# Patient Record
Sex: Male | Born: 1935 | Race: White | Hispanic: No | Marital: Married | Smoking: Former smoker
Health system: Southern US, Community
[De-identification: ages and names within clinical notes are randomized; demographics above are authoritative.]

## PROBLEM LIST (undated history)

## (undated) DIAGNOSIS — Z9189 Other specified personal risk factors, not elsewhere classified: Secondary | ICD-10-CM

## (undated) DIAGNOSIS — I10 Essential (primary) hypertension: Secondary | ICD-10-CM

## (undated) DIAGNOSIS — Z8673 Personal history of transient ischemic attack (TIA), and cerebral infarction without residual deficits: Secondary | ICD-10-CM

## (undated) DIAGNOSIS — K219 Gastro-esophageal reflux disease without esophagitis: Secondary | ICD-10-CM

## (undated) DIAGNOSIS — I480 Paroxysmal atrial fibrillation: Secondary | ICD-10-CM

## (undated) DIAGNOSIS — I2510 Atherosclerotic heart disease of native coronary artery without angina pectoris: Secondary | ICD-10-CM

## (undated) DIAGNOSIS — N529 Male erectile dysfunction, unspecified: Secondary | ICD-10-CM

## (undated) DIAGNOSIS — R5383 Other fatigue: Secondary | ICD-10-CM

## (undated) DIAGNOSIS — I6523 Occlusion and stenosis of bilateral carotid arteries: Secondary | ICD-10-CM

## (undated) DIAGNOSIS — Z951 Presence of aortocoronary bypass graft: Secondary | ICD-10-CM

## (undated) DIAGNOSIS — R5381 Other malaise: Secondary | ICD-10-CM

## (undated) DIAGNOSIS — H44009 Unspecified purulent endophthalmitis, unspecified eye: Secondary | ICD-10-CM

## (undated) DIAGNOSIS — N135 Crossing vessel and stricture of ureter without hydronephrosis: Secondary | ICD-10-CM

## (undated) DIAGNOSIS — J189 Pneumonia, unspecified organism: Secondary | ICD-10-CM

## (undated) HISTORY — DX: Essential (primary) hypertension: I10

## (undated) HISTORY — PX: CORONARY ARTERY BYPASS GRAFT: SHX141

## (undated) HISTORY — DX: Atherosclerotic heart disease of native coronary artery without angina pectoris: I25.10

## (undated) HISTORY — PX: INGUINAL HERNIA REPAIR: SUR1180

## (undated) HISTORY — DX: Paroxysmal atrial fibrillation: I48.0

## (undated) HISTORY — PX: OTHER SURGICAL HISTORY: SHX169

## (undated) HISTORY — DX: Pneumonia, unspecified organism: J18.9

## (undated) HISTORY — DX: Other fatigue: R53.83

## (undated) HISTORY — DX: Other malaise: R53.81

## (undated) HISTORY — DX: Other malaise: R53.83

## (undated) HISTORY — PX: CARDIAC CATHETERIZATION: SHX172

## (undated) HISTORY — DX: Unspecified purulent endophthalmitis, unspecified eye: H44.009

## (undated) HISTORY — PX: CARDIOVASCULAR STRESS TEST: SHX262

---
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
## 2000-07-09 ENCOUNTER — Encounter: Payer: Self-pay | Admitting: Emergency Medicine

## 2000-07-09 ENCOUNTER — Encounter: Admission: RE | Admit: 2000-07-09 | Discharge: 2000-07-09 | Payer: Self-pay | Admitting: Emergency Medicine

## 2000-07-13 ENCOUNTER — Encounter: Admission: RE | Admit: 2000-07-13 | Discharge: 2000-07-13 | Payer: Self-pay | Admitting: Emergency Medicine

## 2000-07-13 ENCOUNTER — Encounter: Payer: Self-pay | Admitting: Emergency Medicine

## 2000-07-20 ENCOUNTER — Encounter: Admission: RE | Admit: 2000-07-20 | Discharge: 2000-07-20 | Payer: Self-pay | Admitting: Emergency Medicine

## 2000-07-20 ENCOUNTER — Encounter: Payer: Self-pay | Admitting: Emergency Medicine

## 2000-08-23 ENCOUNTER — Encounter: Payer: Self-pay | Admitting: Gastroenterology

## 2000-08-23 ENCOUNTER — Ambulatory Visit (HOSPITAL_COMMUNITY): Admission: RE | Admit: 2000-08-23 | Discharge: 2000-08-23 | Payer: Self-pay | Admitting: Gastroenterology

## 2000-08-24 ENCOUNTER — Encounter: Payer: Self-pay | Admitting: Gastroenterology

## 2000-08-24 ENCOUNTER — Ambulatory Visit (HOSPITAL_COMMUNITY): Admission: RE | Admit: 2000-08-24 | Discharge: 2000-08-24 | Payer: Self-pay | Admitting: Gastroenterology

## 2000-09-18 ENCOUNTER — Ambulatory Visit (HOSPITAL_COMMUNITY): Admission: RE | Admit: 2000-09-18 | Discharge: 2000-09-18 | Payer: Self-pay | Admitting: Gastroenterology

## 2000-09-26 ENCOUNTER — Encounter: Payer: Self-pay | Admitting: General Surgery

## 2000-10-02 ENCOUNTER — Encounter (INDEPENDENT_AMBULATORY_CARE_PROVIDER_SITE_OTHER): Payer: Self-pay | Admitting: Specialist

## 2000-10-02 ENCOUNTER — Ambulatory Visit (HOSPITAL_COMMUNITY): Admission: RE | Admit: 2000-10-02 | Discharge: 2000-10-03 | Payer: Self-pay | Admitting: General Surgery

## 2000-10-02 HISTORY — PX: LAPAROSCOPIC CHOLECYSTECTOMY: SUR755

## 2001-11-13 ENCOUNTER — Ambulatory Visit (HOSPITAL_COMMUNITY): Admission: RE | Admit: 2001-11-13 | Discharge: 2001-11-13 | Payer: Self-pay | Admitting: Urology

## 2001-11-13 ENCOUNTER — Encounter: Payer: Self-pay | Admitting: Urology

## 2005-10-30 ENCOUNTER — Ambulatory Visit: Payer: Self-pay | Admitting: Cardiology

## 2005-10-30 ENCOUNTER — Inpatient Hospital Stay (HOSPITAL_COMMUNITY): Admission: EM | Admit: 2005-10-30 | Discharge: 2005-11-06 | Payer: Self-pay | Admitting: Cardiology

## 2005-11-28 ENCOUNTER — Ambulatory Visit: Payer: Self-pay | Admitting: Cardiology

## 2005-11-30 ENCOUNTER — Encounter (HOSPITAL_COMMUNITY): Admission: RE | Admit: 2005-11-30 | Discharge: 2006-02-28 | Payer: Self-pay | Admitting: Cardiology

## 2005-12-18 ENCOUNTER — Ambulatory Visit: Payer: Self-pay | Admitting: Cardiology

## 2006-01-11 ENCOUNTER — Ambulatory Visit: Payer: Self-pay | Admitting: Cardiology

## 2006-01-29 ENCOUNTER — Emergency Department (HOSPITAL_COMMUNITY): Admission: EM | Admit: 2006-01-29 | Discharge: 2006-01-29 | Payer: Self-pay | Admitting: Emergency Medicine

## 2006-01-31 ENCOUNTER — Ambulatory Visit: Payer: Self-pay | Admitting: Cardiology

## 2006-03-01 ENCOUNTER — Encounter (HOSPITAL_COMMUNITY): Admission: RE | Admit: 2006-03-01 | Discharge: 2006-03-27 | Payer: Self-pay | Admitting: Cardiology

## 2006-05-22 ENCOUNTER — Ambulatory Visit: Payer: Self-pay | Admitting: Cardiology

## 2006-11-12 ENCOUNTER — Ambulatory Visit: Payer: Self-pay | Admitting: Cardiology

## 2006-12-05 ENCOUNTER — Ambulatory Visit: Payer: Self-pay

## 2006-12-26 ENCOUNTER — Encounter: Admission: RE | Admit: 2006-12-26 | Discharge: 2006-12-26 | Payer: Self-pay | Admitting: Emergency Medicine

## 2006-12-31 ENCOUNTER — Encounter: Admission: RE | Admit: 2006-12-31 | Discharge: 2006-12-31 | Payer: Self-pay | Admitting: Emergency Medicine

## 2007-01-14 ENCOUNTER — Encounter: Admission: RE | Admit: 2007-01-14 | Discharge: 2007-01-14 | Payer: Self-pay | Admitting: Emergency Medicine

## 2007-02-01 ENCOUNTER — Encounter: Admission: RE | Admit: 2007-02-01 | Discharge: 2007-02-01 | Payer: Self-pay | Admitting: Emergency Medicine

## 2007-11-07 ENCOUNTER — Ambulatory Visit: Payer: Self-pay | Admitting: Cardiology

## 2007-11-20 ENCOUNTER — Encounter: Payer: Self-pay | Admitting: Cardiology

## 2007-11-20 ENCOUNTER — Ambulatory Visit: Payer: Self-pay

## 2007-11-26 ENCOUNTER — Ambulatory Visit: Payer: Self-pay | Admitting: Cardiology

## 2007-12-26 ENCOUNTER — Ambulatory Visit: Payer: Self-pay | Admitting: Cardiology

## 2008-12-10 DIAGNOSIS — R5381 Other malaise: Secondary | ICD-10-CM | POA: Insufficient documentation

## 2008-12-10 DIAGNOSIS — F528 Other sexual dysfunction not due to a substance or known physiological condition: Secondary | ICD-10-CM

## 2008-12-10 DIAGNOSIS — I10 Essential (primary) hypertension: Secondary | ICD-10-CM

## 2008-12-10 DIAGNOSIS — R5383 Other fatigue: Secondary | ICD-10-CM

## 2008-12-16 ENCOUNTER — Encounter: Payer: Self-pay | Admitting: Cardiology

## 2008-12-21 ENCOUNTER — Encounter: Payer: Self-pay | Admitting: Cardiology

## 2008-12-22 ENCOUNTER — Ambulatory Visit: Payer: Self-pay | Admitting: Cardiology

## 2008-12-22 DIAGNOSIS — R079 Chest pain, unspecified: Secondary | ICD-10-CM

## 2008-12-24 ENCOUNTER — Ambulatory Visit (HOSPITAL_COMMUNITY): Admission: RE | Admit: 2008-12-24 | Discharge: 2008-12-24 | Payer: Self-pay | Admitting: Emergency Medicine

## 2009-01-11 ENCOUNTER — Telehealth (INDEPENDENT_AMBULATORY_CARE_PROVIDER_SITE_OTHER): Payer: Self-pay

## 2009-01-12 ENCOUNTER — Ambulatory Visit: Payer: Self-pay

## 2009-01-12 ENCOUNTER — Encounter: Payer: Self-pay | Admitting: Cardiology

## 2009-12-15 DIAGNOSIS — I2510 Atherosclerotic heart disease of native coronary artery without angina pectoris: Secondary | ICD-10-CM | POA: Insufficient documentation

## 2009-12-23 ENCOUNTER — Ambulatory Visit: Payer: Self-pay | Admitting: Cardiology

## 2009-12-23 DIAGNOSIS — E785 Hyperlipidemia, unspecified: Secondary | ICD-10-CM

## 2010-09-27 NOTE — Assessment & Plan Note (Signed)
Summary: 77 YR F/U   Visit Type:  1 yr f/u Primary Provider:  Leslee Home  CC:  no cardiac complaints today.  History of Present Illness: Jose [name] returns today for evaluation and management coronary artery disease, history of cardiac bypass grafting in 2007, history of an inferior Jose [name] infarct at the time of presentation.  He is having no angina or ischemic symptoms. His blood work is being followed by Dr. Lorenz Coaster. He has a physical next week.  Stress Myoview last May showed inferior Jose [name] scar EF 54% no ischemia.  Current Medications (verified): 1)  Aspirin Ec 325 Mg Tbec (Aspirin) .... Take One Tablet By Mouth Daily 2)  Accupril 20 Mg Tabs (Quinapril Hcl) .Marland Kitchen.. 1 Tab Once Daily 3)  Prilosec 20 Mg Cpdr (Omeprazole) .Marland Kitchen.. 1 Tab Once Daily 4)  Multivitamins   Tabs (Multiple Vitamin) .Marland Kitchen.. 1 Tab Once Daily 5)  Saw Palmetto 160 Mg Caps (Saw Palmetto (Serenoa Repens)) .... 2 Tab Once Daily 6)  Amlodipine Besylate 5 Mg Tabs (Amlodipine Besylate) .Marland Kitchen.. 1 Tab Once Daily 7)  Cialis 2.5 Mg Tabs (Tadalafil) .Marland Kitchen.. 1 Tab Once Daily 8)  Advil Allergy Sinus 2-30-200 Mg Tabs (Chlorpheniramine-Pse-Ibuprofen) .... As Needed 9)  Pravastatin Sodium 40 Mg Tabs (Pravastatin Sodium) .... 2 Tabs At Bedtime  Allergies: 1)  ! * Xray Dye 2)  ! Sulfa 3)  ! Codeine  Past History:  Past Medical History: Last updated: 12/15/2009 CAD, NATIVE VESSEL (ICD-414.01) CHEST PAIN UNSPECIFIED (ICD-786.50) HYPERTENSION, UNSPECIFIED (ICD-401.9) FATIGUE (ICD-780.79) ERECTILE DYSFUNCTION (ICD-302.72)  Past Surgical History: Last updated: 12/10/2008 CABG March 2007 Appendectomy Cholecystectomy Bilateral inguinal hernia repair Multiple transurethral procedures for urteral obstruction  Family History: Last updated: 12/10/2008 Family History of Coronary Artery Disease:  Mother  Social History: Last updated: 12/10/2008 Retired  Married  Tobacco Use - Former.  quit 1979 Alcohol Use - no Drug Use - no  Risk  Factors: Smoking Status: quit (12/10/2008)  Review of Systems       negative other than history of present illness  Vital Signs:  Patient profile:   77 year old male Height:      68 inches Weight:      179 pounds BMI:     27.32 Pulse rate:   67 / minute Pulse rhythm:   irregular BP sitting:   120 / 70  (left arm) Cuff size:   large  Vitals Entered By: Danielle Rankin, CMA (December 23, 2009 9:04 AM)  Physical Exam  General:  Well developed, well nourished, in no acute distress.ruddy complexion which is baseline Head:  normocephalic and atraumatic Eyes:  PERRLA/EOM intact; conjunctiva and lids normal. Neck:  Neck supple, no JVD. No masses, thyromegaly or abnormal cervical nodes. Chest Kailey Esquilin:  no deformities or breast masses noted Lungs:  Clear bilaterally to auscultation and percussion. Heart:  Non-displaced PMI, chest non-tender; regular rate and rhythm, S1, S2 without murmurs, rubs or gallops. Carotid upstroke normal, no bruit. Normal abdominal aortic size, no bruits. Femorals normal pulses, no bruits. Pedals normal pulses. No edema, no varicosities. Abdomen:  Bowel sounds positive; abdomen soft and non-tender without masses, organomegaly, or hernias noted. No hepatosplenomegaly. Msk:  Back normal, normal gait. Muscle strength and tone normal. Pulses:  diminished but present the lower extremity Extremities:  trace left pedal edema and trace right pedal edema.   Neurologic:  Alert and oriented x 3. Skin:  Intact without lesions or rashes. Psych:  Normal affect.   EKG  Procedure date:  12/23/2009  Findings:  normal sinus rhythm with PACs, no changes, stable  Impression & Recommendations:  Problem # 1:  CAD, NATIVE VESSEL (ICD-414.01) Assessment Unchanged  His updated medication list for this problem includes:    Aspirin Ec 325 Mg Tbec (Aspirin) .Marland Kitchen... Take one tablet by mouth daily    Accupril 20 Mg Tabs (Quinapril hcl) .Marland Kitchen... 1 tab once daily    Amlodipine Besylate 5 Mg  Tabs (Amlodipine besylate) .Marland Kitchen... 1 tab once daily  Orders: EKG w/ Interpretation (93000)  Problem # 2:  CHEST PAIN UNSPECIFIED (ICD-786.50) Assessment: Improved  His updated medication list for this problem includes:    Aspirin Ec 325 Mg Tbec (Aspirin) .Marland Kitchen... Take one tablet by mouth daily    Accupril 20 Mg Tabs (Quinapril hcl) .Marland Kitchen... 1 tab once daily    Amlodipine Besylate 5 Mg Tabs (Amlodipine besylate) .Marland Kitchen... 1 tab once daily  Problem # 3:  HYPERTENSION, UNSPECIFIED (ICD-401.9) Assessment: Improved  His updated medication list for this problem includes:    Aspirin Ec 325 Mg Tbec (Aspirin) .Marland Kitchen... Take one tablet by mouth daily    Accupril 20 Mg Tabs (Quinapril hcl) .Marland Kitchen... 1 tab once daily    Amlodipine Besylate 5 Mg Tabs (Amlodipine besylate) .Marland Kitchen... 1 tab once daily  Problem # 4:  HYPERLIPIDEMIA-MIXED (ICD-272.4)  His updated medication list for this problem includes:    Pravastatin Sodium 40 Mg Tabs (Pravastatin sodium) .Marland Kitchen... 2 tabs at bedtime  Patient Instructions: 1)  Your physician recommends that you schedule a follow-up appointment in: year with dr Jose [name] 2)  Your physician recommends that you continue on your current medications as directed. Please refer to the Current Medication list given to you today.

## 2010-12-16 ENCOUNTER — Ambulatory Visit: Payer: Self-pay | Admitting: Cardiology

## 2010-12-28 ENCOUNTER — Encounter: Payer: Self-pay | Admitting: Cardiology

## 2010-12-29 ENCOUNTER — Ambulatory Visit (INDEPENDENT_AMBULATORY_CARE_PROVIDER_SITE_OTHER): Payer: Medicare Other | Admitting: Cardiology

## 2010-12-29 ENCOUNTER — Encounter: Payer: Self-pay | Admitting: Cardiology

## 2010-12-29 VITALS — BP 116/58 | HR 57 | Ht 70.0 in | Wt 179.8 lb

## 2010-12-29 DIAGNOSIS — I2510 Atherosclerotic heart disease of native coronary artery without angina pectoris: Secondary | ICD-10-CM

## 2010-12-29 NOTE — Assessment & Plan Note (Signed)
Stable, continue current meds 

## 2010-12-29 NOTE — Patient Instructions (Signed)
Your physician recommends that you schedule a follow-up appointment in: 1 year with Dr. Wall  

## 2010-12-29 NOTE — Progress Notes (Signed)
   Patient ID: Jose [name], male    DOB: [birth year], 77 y.o.   MRN: [id number]  HPI Jose [name] returns for E and M of his CAD and history of CABG. He has moved to a nice retirement community and is very active [employer]. No angina of ischemic symptoms. EKG shows sinus bradycardia, otherwise normal.    Review of Systems  All other systems reviewed and are negative.      Physical Exam  Nursing note and vitals reviewed. Constitutional: He is oriented to person, place, and time. He appears well-developed and well-nourished. No distress.  HENT:  Head: Normocephalic and atraumatic.  Eyes: EOM are normal. Pupils are equal, round, and reactive to light.  Neck: Neck supple. No JVD present. No tracheal deviation present. No thyromegaly present.       No bruits  Cardiovascular: Normal rate, regular rhythm, normal heart sounds and intact distal pulses.   No murmur heard. Pulmonary/Chest: Effort normal and breath sounds normal.  Abdominal: Soft. Bowel sounds are normal.       No bruit  Musculoskeletal: Normal range of motion. He exhibits no edema.  Neurological: He is alert and oriented to person, place, and time.  Skin: Skin is warm and dry.  Psychiatric: He has a normal mood and affect.

## 2011-01-06 ENCOUNTER — Encounter: Payer: Self-pay | Admitting: Cardiology

## 2011-01-10 NOTE — Assessment & Plan Note (Signed)
[facility]                            CARDIOLOGY OFFICE NOTE   NAME:Ricchio, [name]                     MRN:          [id number]  DATE:11/07/2007                            DOB:          [birth year]    Jose [name] returns today for further management of his coronary  disease.   He is status post coronary bypass grafting for severe three-vessel  coronary disease and class IV angina in March 2007.   He is having no angina or ischemic symptoms.  He has no orthopnea, PND,  or peripheral edema.  His biggest complaint is erectile dysfunction,  which is responding marginally to Cialis.  He is wondering if Toprol  could do this.   He also has hypertension, hyperlipidemia being followed by Dr. Leslee Home.  He says his numbers have been very good.   Stress Myoview December 05, 2006 showed no ischemia.  An EF of 53%.  Had  some inferior lateral wall scar with decreased motion in that area.   His meds today are Toprol XL 75 mg a day, saw palmetto, multivitamin,  Prilosec over-the-counter, Pravachol 80 mg a day, Accupril 20 mg a day,  aspirin 325 mg a day.   Blood pressure today is 135/82 in the left arm.  Pulse is 58 and  regular.  EKG shows sinus brady otherwise normal.  His weight is 193 up  6.  HEENT:  Skin color is less ruddy than usual.  PERRL.  Extraocular is  intact.  Sclerae are clear.  Face symmetry normal.  Carotids were equal bilateral bruits, no JVD.  Thyroid is not enlarged.  Neck is supple.  Trachea is midline.  LUNGS:  Clear to auscultation.  HEART:  Reveals a stable sternotomy, normal S1-S2.  No gallop, rub or  murmur.  ABDOMEN:  Soft, good bowel sounds.  No midline bruit.  No hepatomegaly.  EXTREMITIES:  No cyanosis, clubbing or edema.  Pulses are intact.  NEURO:  Exam is intact.   I had A nice chat with Jose [name] today.  I have decreased his Toprol  to 37.5 mg a day to see if this helps his erectile dysfunction.  We will  repeat  his 2-D echocardiogram.  If his EF is greater than 50%, the  relative risk/benefit of continuing beta blockade is very low.  At that  point, we may just stop it altogether.  I will have him come back in  several weeks to see how he feels.  Will do  the echo and decide on further dosing of his Toprol.  I told him he may  need an additional drug absent Toprol to control his blood pressure with  his Accupril.     Thomas C. Daleen Squibb, MD, [facility]  Electronically Signed    TCW/MedQ  DD: 11/07/2007  DT: 11/08/2007  Job #: [id number]   cc:   Reuben Likes, M.D.

## 2011-01-10 NOTE — Assessment & Plan Note (Signed)
[facility]                            CARDIOLOGY OFFICE NOTE   NAME:Hepner, [name]                     MRN:          [id number]  DATE:12/26/2007                            DOB:          07/14/1931    [name] returns today for further management of his hypertension,  coronary disease, fatigue and erectile dysfunction.   Discontinued his Toprol and placed him on amlodipine 5 mg a day.   His erectile dysfunction is not improved unless he uses something like  Cialis, however, his blood pressure is under great control and he had no  side effects on the amlodipine.  His EF is essentially normal, so we  felt safe in stopping his beta-blocker.   His blood pressure today is 117/69.  His pulse is 74 and regular.  His  weight is 192.  The rest of the exam is unchanged.  LUNGS:  Clear.  HEART:  Reveals a regular rate and rhythm.  The abdomen is soft.  The extremities reveal no edema.  Pulses are intact.   [name] blood pressure is actually better on amlodipine than it  was on Toprol.  Unfortunately, his erectile dysfunction has not  substantially improved and I advised him to follow up with Dr. Lorenz Coaster  and also maybe urology for this.  I will plan on seeing him back in a  year.  We will stay off the beta-blocker.     Thomas C. Daleen Squibb, MD, [facility]  Electronically Signed    TCW/MedQ  DD: 12/26/2007  DT: 12/26/2007  Job #: [id number]

## 2011-01-10 NOTE — Assessment & Plan Note (Signed)
[facility]                            CARDIOLOGY OFFICE NOTE   NAME:Jose [name], Jose [name]                     MRN:          [id number]  DATE:11/26/2007                            DOB:          1930-11-02    Mr. Runyan returns today for further management of his fatigue and  erectile dysfunction.  I decreased his Toprol XL from 75 to 37.5 mg on  last visit.  Please refer to that note.   Unfortunately, he has not seen any significant change in his symptoms.   Blood pressure today was 142/82, but I checked it again in the left arm;  it was 160/82.  His pulse is 62 and regular.  Weight is 189, down 4.  The rest his exam is unchanged.  He has no peripheral edema.  He does  have dependent rubor with decreased blood flow to his toes.  His  dorsalis pedis and posterior tibial pulses are present, however.   ASSESSMENT/PLAN:  Mr. Dittus has noted no significant improvement in  his erectile dysfunction or his fatigue on a lower dose of Toprol.  We  are going to discontinue it altogether.  We will add amlodipine 5 mg a  day to his Accupril 20 mg a day.  I will see him back in 4 weeks for a  blood pressure check and see how he is feeling.  Hopefully this will  help some of his symptoms.  Maintaining his blood pressures is of  absolute importance, however, as I explained to him and his wife,  Jose [name], today.     Thomas C. Daleen Squibb, MD, [facility]  Electronically Signed    TCW/MedQ  DD: 11/26/2007  DT: 11/26/2007  Job #: [id number]   cc:   Reuben Likes, M.D.

## 2011-01-13 NOTE — Op Note (Signed)
[place]  Patient:    Jose [name], Jose [name]                     MRN: [id number] Proc. Date: 10/02/00 Adm. Date:  [id number] Disc. Date: [id number] Attending:  Charna Elizabeth CC:         Anselmo Rod, M.D.  Reuben Likes, M.D.   Operative Report  PREOPERATIVE DIAGNOSIS:  Biliary dyskinesia.  POSTOPERATIVE DIAGNOSIS:  Biliary dyskinesia.  PROCEDURE:  Laparoscopic cholecystectomy.  SURGEON:  Adolph Pollack, M.D.  ASSISTANT:  Gita Kudo, M.D.  ANESTHESIA:  General.  INDICATIONS:  Mr. Gustafson is a 77 year old male, who has been having postprandial right upper quadrant pain that radiates around to his back associated with some nausea.  Fairly classic of biliary colic.  He has had an ultrasound of the gallbladder that was normal.  He had a nuclear medicine hepatobiliary scan and drank some half-and-half.  His ejection fraction was reported as being normal, but when he had the half-and-half, he had the recurrence of the pain.  Unfortunately, cholecystokinin is not available.  He was tried on antispasmodics without any improvement of symptoms.  It was felt that he likely has biliary dyskinesia despite his nuclear medicine hepatobiliary scan and is now brought to the operating room.  TECHNIQUE:  He was placed supine on the operating table and general anesthetic was administered.  The abdomen was sterilely prepped and draped.  Local anesthetic was infiltrated in the subumbilical region and a subumbilical incision was made and carried down through the fascia, where a 1.5 cm incision was made in the midline fascia.  A purse-string suture of 0 Vicryl was placed around the fascial edges.  The peritoneum was grasped, incised sharply and entered under direct vision.  A Hasson trocar was introduced into the peritoneal cavity and pneumoperitoneum created by insufflation of CO2 gas. Next, the laparoscope was introduced.  I examined the abdomen.  I  could see the scarring from bilateral inguinal hernia repairs.  The gallbladder appeared pale in color.  It was not the normal bluish color.  An incision was made in the epigastrium and an 11 mm trocar passed into the abdominal cavity.  Two 5 mm trocars were placed in the right and mid abdomen.  The fundus of the gallbladder was grasped and retracted towards the right shoulder.  The infundibulum was grasped and retracted laterally.  Using blunt dissection, the infundibulum was mobilized.  I then identified the cystic duct at its junction with gallbladder and isolated it.  I also isolated the cystic artery.  I clipped the cystic duct three times proximally, once distally and divided it sharply.  The cystic artery was then clipped and divided.  The gallbladder was dissected free from the liver bed using electrocautery.  The liver bed was irrigated and bleeding points were controlled with cautery.  There was a small hole in the fundus with some bile leakage.  I placed the gallbladder in an Endopouch bag.  I then copiously irrigated out the perihepatic area and examined the gallbladder fossa and again noting no bleeding or bile leakage. I continued irrigation and then evacuated the fluid until it was clear.  Next, I removed the gallbladder in the Endopouch bag through the subumbilical port and then closed the fascial defect by tightening up and tying down the purse-string suture under laparoscopic vision.  The rest of the trocars were removed and no bleeding was noted.  The pneumoperitoneum was  then released.  The skin incisions were then closed with 4-0 Monocryl subcuticular stitches, followed by steri-strips and sterile dressings.  He tolerated the procedure well without any apparent complications and was taken to the recovery room in satisfactory condition. DD:  10/02/00 TD:  10/02/00 Job: [id number]

## 2011-01-13 NOTE — Procedures (Signed)
[place]  Patient:    Jose [name], Jose [name]                     MRN: [id number] Proc. Date: 09/18/00 Adm. Date:  [id number] Attending:  Charna Elizabeth CC:         Reuben Likes, M.D.             Adolph Pollack, M.D.                           Procedure Report  DATE OF BIRTH:  April 21, 1931  REFERRING PHYSICIAN:  PROCEDURE PERFORMED:  Esophagogastroduodenoscopy.  ENDOSCOPIST:  Anselmo Rod, M.D.  INSTRUMENT USED:  Olympus video panendoscope.  INDICATIONS FOR PROCEDURE:  Severe right upper quadrant pain with nausea and vomiting radiating to the epigastrium and the back in a 77 year old white male with essentially normal abdominal ultrasound, HIDA scan and abdominal CT, rule out peptic ulcer disease, esophagitis, gastritis, etc.  PREPROCEDURE PREPARATION:  Informed consent was procured from the patient. The patient was fasted for eight hours prior to the procedure.  PREPROCEDURE PHYSICAL:  The patient had stable vital signs.  Neck supple. Chest clear to auscultation.  S1, S2 regular.  Abdomen soft with normal abdominal bowel sounds.  DESCRIPTION OF PROCEDURE:  The patient was placed in left lateral decubitus position and sedated with 50 mg of Demerol and 5 mg of Versed intravenously. Once the patient was adequately sedated and maintained on low-flow oxygen and continuous cardiac monitoring, the Olympus video panendoscope was advanced through the mouthpiece, over the tongue, into the esophagus under direct vision.  The entire esophagus appeared normal without evidence of ring stricture, masses, lesions, esophagitis or Barretts mucosa.  The scope was then advanced to the stomach.  The entire gastric mucosa appeared healthy with no evidence of erosions, ulcerations, masses or polyps.  The duodenal bulb and the small bowel distal to the bulb appeared normal.  IMPRESSION:  Normal esophagogastroduodenoscopy.  RECOMMENDATION:  Surgical  evaluation has been recommended for the patient because of his right upper quadrant pain associated with nausea and vomiting. The patient will be scheduled to see Adolph Pollack, M.D. for further evaluation for possible laparoscopic cholecystectomy. DD:  09/18/00 TD:  09/18/00 Job: [id number]

## 2011-01-13 NOTE — H&P (Signed)
NAMEHILMAR, Jose [name] NO.:  [id number]   MEDICAL RECORD NO.:  [id number]          PATIENT TYPE:  INP   LOCATION:  2028                         FACILITY:  [facility]   PHYSICIAN:  Jesse Sans. Wall, M.D.   DATE OF BIRTH:  Jan 17, 1931   DATE OF ADMISSION:  10/30/2005  DATE OF DISCHARGE:                                HISTORY & PHYSICAL   BRIEF HISTORY:  Jose [name] is a 77 year old white male who is transferred  from [facility] to [facility] unit 2000 for further cardiac  evaluation.  Jose [name] has been staying at [facility] since early  February and states that he has been in his usual state of good health until  this past Thursday, October 26, 2005.  He stated initially on Thursday he  noticed an abdominal and chest bloating sensation.  This did not change with  burping, Rolaids, Alka-Seltzer.  He felt that his bowel movements were  slightly unusual and that he was slightly constipated.  He took some  laxatives on Thursday with partial relief.  On Friday he felt a little  better from the bloating sensation.  However, he noted an anterior chest  pressure that radiated up into his posterior neck.  He thought he might have  had some shortness of breath, particularly when he tried to walk around on  Friday.  He stated with exertion he felt much worse.  He felt better with  rest but he could not get comfortable whether lying down, sitting up.  His  general feeling of unwell at least lasted all day Friday into Saturday and  even Saturday evening.  Saturday evening he did note some nausea, but denied  vomiting or diaphoresis.  He stated that his appetite was fine throughout  this.  Early Sunday morning he stated that he just felt worse, very  uncomfortable.  This prompted his presentation to [facility].   He was admitted to [facility] and overnight he was ruled out for  myocardial infarction.  He continued to not feel well.  Discharge was  anticipated.  However, the physician at [facility] spoke with his primary  care physician and it was felt that he should be transferred here for  further evaluation from a cardiac perspective.  [facility] transferred Mr.  [name] and en route the patient complained of some chest discomfort and IV  nitroglycerin was started.  He thinks that the IV nitroglycerin relieved his  discomfort; however, now he has a headache.  He denies any recent injuries  or accidents.   PAST MEDICAL HISTORY:   ALLERGIES:  SULFA, CODEINE, X-RAY DYE that causes his throat to swell.   MEDICATIONS PRIOR TO ADMISSION:  1.  Quinapril 40 mg daily.  2.  Aspirin 81 mg daily.  3.  Fish oil unknown dosage daily.  4.  Prilosec 20 mg daily.  5.  Centrum Silver daily.  6.  Saw palmetto until unknown dosage daily.   PAST MEDICAL HISTORY:  1.  He has a history of hyper lipidemia, unknown last check.  2.  Rosacea.  3.  Hypertension which ranges from 120s-140s/80s at home.  4.  GERD.  5.  Status post cholecystectomy, appendectomy, bilateral inguinal hernia      repair, kidney surgery in the 1950s.  6.  He denies any problems with diabetes, myocardial infarction, CVA, COPD,      bleeding dyscrasias, or thyroid dysfunction.   SOCIAL HISTORY:  He resides in [place] with his wife.  He is a retired  Airline pilot and enjoys Naval architect.  He quit smoking 30 years ago.  Prior to  that he smoked a pack per day for 25 years.  He may have a glass of wine one  to two times per year.  He denies any drugs, specific diet, or exercise  program.  He does use of herbal supplements as listed.   FAMILY HISTORY:  His mother died in her 45s secondary to heart problems with  a history of hypertension and pacer.  His father died at the age of 67  secondary to lymphoma with a history of prostate cancer.  He has two  brothers that are living.  One had an aneurysm repair, specifics unknown.  Another brother has had a stent placed in his  leg.  Two brothers are  deceased, one of prostate cancer, another with a history of coronary artery  disease and had bypass surgery in the past.  He has two sisters.  One has  problems with depression and the other with stomach problems.   REVIEW OF SYSTEMS:  Notable for occasional headaches, reading glasses, upper  and lower dentures, rosacea, prior skin cancer removals, history as noted  above, straining, nocturia, right shoulder and neck arthralgias.  It is  noted that he states that the above discomfort does not resemble his usual  indigestion.   PHYSICAL EXAMINATION:  GENERAL:  Well-nourished, well-developed, pleasant  white male in no apparent distress.  HEENT:  Essentially unremarkable except for dentures and his mouth is  somewhat flattened on the right side which is normal according to the  patient and his wife.  NECK:  Supple without thyromegaly, adenopathy, JVD, or carotid bruits.  He  does have point tenderness in his posterior neck.  HEART:  Regular rate and rhythm.  Normal S1 and S2.  Did not appreciate any  murmurs, rubs, clicks, or gallops.  All pulses are symmetrical and intact  without bruits.  CHEST:  Symmetrical excursion.  Lungs sounds are diminished without rales,  rhonchi, or wheezing.  SKIN/INTEGUMENT:  Intact without lesions.  ABDOMEN:  Obese.  Bowel sounds present without organomegaly, masses, or  tenderness.  EXTREMITIES:  Negative cyanosis, clubbing, or edema.  MUSCULOSKELETAL:  Essentially unremarkable except for tenderness posterior  neck and lower back and some right upper extremity slight limitations in  full range of motion activities.  NEUROLOGIC:  Grossly intact.   LABORATORIES:  Chest x-ray from [facility] is pending at the time of this  dictation.  Abdominal and pelvic CT on October 29, 2005 showed severe right  hydronephrosis with UP junction stenosis.  EKGs x3 at [facility] showed sinus brady, normal sinus rhythm, no acute changes, some  baseline artifact.  CBC is not available.  Sodium was 136, potassium 3.7, BUN 16, creatinine  1.3, and glucose 129.  CK-MBs and troponins were negative x4.  Lipids showed  a total cholesterol 254, triglycerides 155, HDL 15, and LDL of 89.   IMPRESSION:  1.  Acute coronary syndrome transfer from [facility]; however,  is ruled out for myocardial infarction by EKG and enzymes.  2.  Hypertension on admission to [facility].  3.  Hyperlipidemia.  4.  Dye allergy.  5.  History as noted above.   DISPOSITION/PLAN:  Dr. Daleen Squibb reviewed the patient's history, spoke with and  examined the patient and agrees with the above.  We will arrange for a  cardiac catheterization to further evaluate his symptoms.  He will be pre  medicated for dye allergy as well as fluids for protection of his kidneys.  In addition, I have called [facility] for copies of his chest x-  ray report and CBC to be faxed to the unit 2000.  We will also repeat  laboratories, especially in regards to his CBC, BMET, addition to amylase,  lipase, LFTs and one more CK-MB and troponin.  We will also check a  urinalysis.  I have given him educational material in regards to stress  testing, catheterization, and hyperlipidemia.      Joellyn Rued, P.A. [facility]      Thomas C. Wall, M.D.  Electronically Signed    EW/MEDQ  D:  10/30/2005  T:  10/31/2005  Job:  [id number]   cc:   Courtney Paris, M.D.  Fax: [phone]   Reuben Likes, M.D.  Fax: [phone]

## 2011-01-13 NOTE — Discharge Summary (Signed)
Jose [name], Jose [name] NO.:  [id number]   MEDICAL RECORD NO.:  [id number]          PATIENT TYPE:  INP   LOCATION:  2008                         FACILITY:  [facility]   PHYSICIAN:  Salvatore Decent. Cornelius Moras, M.D. DATE OF BIRTH:  06/24/1931   DATE OF ADMISSION:  10/30/2005  DATE OF DISCHARGE:                                 DISCHARGE SUMMARY   ADMIT DIAGNOSIS:  Chest pain.   PAST MEDICAL HISTORY AND DISCHARGE DIAGNOSES:  1.  Hypertension.  2.  Gastroesophageal reflux disease.  3.  Chronic right-sided hydronephrosis.  4.  Hyperlipidemia.  5.  Three-vessel coronary artery disease with patent foramen ovale, status      post coronary artery bypass grafting x6 with closure of patent foramen      ovale.  6.  Postoperative atrial fibrillation, currently sinus, on amiodarone.  7.  Status post cholecystectomy.  8.  Status post appendectomy.  9.  Status post bilateral inguinal hernia repair.  10. Status post multiple transurethral procedures for ureteral obstruction.   ALLERGIES:  1.  IV CONTRAST, which causes throat swelling and rash.  2.  SULFA DRUGS.  3.  CODEINE.  4.  NITROGLYCERIN causes nausea and vomiting.   BRIEF HISTORY:  The patient is a 77 year old Caucasian male with no previous  history of coronary artery disease, who states that he was in his usual  state of health until October 26, 2005, at which time he began to develop  substernal and epigastric pressure-like discomfort that he initially  attributed to indigestion and constipation.  The patient's symptoms  persisted for 24-48 hours, despite the use of laxatives with bowel movement  and antacids.  The pain became more localized to the chest at time and then  radiated up into his neck.  On October 28, 2005, the pain persisted so that the  patient could not sleep and he ultimately presented to the emergency room at  [facility] in [place], [place].  The patient was  noted to have a resting EKG in  sinus rhythm with some subtle ST segment  changes in the inferior leads.  He was also notably hypertensive.  The  symptoms persisted, despite administration of a GI cocktail.  He also  underwent abdominal and pelvic CT scan that showed only what appeared to be  chronic right-sided hydronephrosis with no other significant findings.  The  patient was admitted to [facility] with presumed acute coronary  syndrome.  Cardiac enzymes remained negative and the patient was then  transferred to [facility] for further management and therapy.  At times, his  symptoms have been relieved with nitroglycerin.   HOSPITAL COURSE:  The patient was admitted to [facility] via transfer from  [facility] in [place] for further management and workup of  presumed acute coronary syndrome.  The patient was taken for elective  cardiac cath by Dr. Riley Kill on October 31, 2005 and this revealed severe three-  vessel coronary artery disease with normal left ventricular function.  The  CVTS Service was then consulted regarding surgical revascularization.  Dr.  Cornelius Moras evaluated the  patient on October 31, 2005 and it was his opinion that the  patient should proceed with coronary artery bypass grafting.  Of note, the  patient did complain of nausea and vomiting, status post cardiac  catheterization.  A GI consult was obtained by Dr. Elnoria Howard, who noted that the  patient had nausea and vomiting in association with nitroglycerin  administration.  When nitroglycerin was discontinued, the patient no longer  had any difficulty with nausea and vomiting.   The patient was taken to the OR on November 01, 2005 for coronary artery bypass  grafting x6 with endoscopic vessel harvesting of the right lower extremity.  On preoperative TEE, a patent foramen ovale was noted and this was close  simultaneously at the time of CABG.  The left internal mammary artery was  grafted to the LAD, saphenous vein was grafted in sequence to  the PD and PL,  saphenous vein was grafted to the diagonal, and saphenous vein was grafted  in sequence to OM-1 and OM-2.  The patient tolerated the procedure well and  was hemodynamically stable immediately postoperatively.  The patient was  transferred from the OR to the SICU in stable condition.  The patient was  extubated without complication and woke up from anesthesia neurologically  intact.   The patient's postoperative course has progressed as expected.  On  postoperative day 1, the patient was afebrile with stable vital signs and  maintaining a normal sinus rhythm.  He had minimal pain.  All invasive lines  and chest tubes were discontinued in a routine manner.  The patient has been  volume-overloaded postoperatively and has been diuresed accordingly.  The  patient began cardiac rehab on postoperative day 1 and has increased his  tolerance to a satisfactory level at this time.   On postoperative day 2, the patient was noted to go into atrial fibrillation  and was started on IV amiodarone.  The patient subsequently converted to a  normal sinus rhythm in the 60s and then was changed to p.o. amiodarone.  He  has tolerated this well and has continued to maintain a normal sinus rhythm  since that time.  He has also been maintained on a beta blocker.  The  patient was transferred from the unit to the floor without difficulty.  On  postoperative day 4, the patient is without complaint, except loose stools  on postoperative day 3.  He has had no further loose stools since that time  and his stool softeners have been held.  He is afebrile with stable vital  signs and maintaining a normal sinus rhythm.  He is still volume-overloaded  and will be sent home on diuretic therapy.  On physical exam, cardiac is  regular rate and rhythm, the lungs are clear to auscultation, the abdomen is  benign, the incisions are clean, dry and intact and there is no edema present in the extremities.  The  patient is in stable condition at this time  and as long he continues to progress in the current manner, will be ready  for discharge within the next 2-3 days, pending morning round reevaluation.   LABORATORY DATA:  CBC on October 27, 2005:  White count 11.5, hemoglobin 10.6,  hematocrit 30.3, platelets 173,000.  BMP on November 04, 2005:  Sodium 135,  potassium 3.8, BUN 21, creatinine 1.5, glucose 99.   CONDITION ON DISCHARGE:  Improved.   INSTRUCTIONS:   MEDICATIONS:  1.  Aspirin 325 mg daily.  2.  Toprol-XL 25  mg daily.  3.  Quinapril 20 mg daily.  4.  Pravachol 80 mg daily.  5.  Amiodarone 200 mg twice daily.  6.  Lasix 40 mg daily x5 days.  7.  K-Dur 20 mEq daily x5 days.  8.  Tylox one to two q.4-6 h. p.r.n. pain.   ACTIVITY:  No driving, no lifting more than 10 pounds x3 weeks and the  patient should continue daily breathing and walking exercises.   DIET:  Low salt, low fat.   WOUND CARE:  The patient may shower daily and clean the incisions with soap  and water.  If wound problems arise, the patient should contact the CVTS  office at [phone].   FOLLOWUP APPOINTMENTS:  1.  Dr. Riley Kill; the patient should contact his office for an appointment 2      weeks after discharge, at which time a chest x-ray will be taken.  2.  Dr. Cornelius Moras 3 weeks after discharge; the CVTS office will contact the      patient with a date and time of that appointment.      Pecola Leisure, PA      Salvatore Decent. Cornelius Moras, M.D.  Electronically Signed    AY/MEDQ  D:  11/05/2005  T:  11/07/2005  Job:  [id number]   cc:   Arturo Morton. Riley Kill, M.D. [facility]  [address]  [address]  [place]  [place] [address]

## 2011-01-13 NOTE — Cardiovascular Report (Signed)
NAMELAVAN, IMES NO.:  [id number]   MEDICAL RECORD NO.:  [id number]          PATIENT TYPE:  INP   LOCATION:  2918                         FACILITY:  [facility]   PHYSICIAN:  Arturo Morton. Riley Kill, M.D. [facility] OF BIRTH:  1931/02/02   DATE OF PROCEDURE:  10/31/2005  DATE OF DISCHARGE:                              CARDIAC CATHETERIZATION   INDICATIONS:  Mr. Dutton as a 77 year old gentleman who [place] angina pectoris. The current study  was done to assess coronary anatomy. His symptoms were consistent with an  acute coronary syndrome.   PROCEDURES:  1.  Left heart catheterization  2.  Selective coronary arteriography.  3.  Selective left ventriculography.  4.  Subclavian angiography.   DESCRIPTION OF PROCEDURE:  The patient was brought to the catheterization  laboratory, prepped and draped in the usual fashion. Through an anterior  puncture, the right femoral artery was entered using a Smart needle. A 6-  French sheath was then placed. Views of the left and right coronary arteries  were obtained in multiple angiographic projections. Subclavian angiography  was performed. Central aortic and left ventricular pressures measured with a  pigtail. Ventriculography was performed in the RAO projection. The patient  tolerated the procedure well. There were no complications. Importantly, the  patient has a history of hydronephrosis. He did have a creatinine of 1.4 and  1.5. He also has a history of contrast allergy and therefore he was  pretreated prior to the procedure. There were no complications. I reviewed  the films with his family in detail.   Surgical consultation was then called.   HEMODYNAMIC DATA:  1.  Central aortic pressure 137/73.  2.  Left ventricular pressure 148/22.  3.  No gradient on pullback across the aortic valve.   ANGIOGRAPHIC DATA:  1.  On plain fluoroscopy there is evidence of fairly extensive  calcification      involving the left and right coronary systems.  2.  Ventriculography was done in the RAO projection. Overall systolic      function appeared to be well-preserved. The aortic leaflets appeared to      move well. There did not appear to be significant mitral regurgitation      nor was there significant wall motion abnormality.  3.  The subclavian appeared to be widely patent as did the internal mammary      vessel.  4.  The right coronary artery is a heavily calcified vessel. There is 50-70%      narrowing proximally and then a 60-70% area of segmental plaquing in the      midvessel that appears to be heavily moderately calcified. The PDA is a      large-caliber vessel of about 70% ostial narrowing and probably 30%      midnarrowing. In the continuation branch that extends on beyond the PDA,      there is about 30% narrowing and then there is disease involving the      posterolateral system at the bifurcation of the first and second      branches.  5.  The left coronary artery is calcified. There is distal tapering of the      left main with about 30% narrowing.  6.  The LAD courses to the apex. The LAD provides a fairly large proximal      diagonal that has probably about 30% narrowing in its takeoff. There is      then segmental 40% disease at the takeoff of the subbranch and there is      an inferior subbranch that has about 90% narrowing but the distal vessel      was relatively small in caliber. The native LAD after the septal      perforator has a 75% area of focal stenosis that is calcified and then      another 75-80% area of focal disease in the midportion of the LAD near      the middistal junction. The distal LAD then wraps the apex and does      appear to be a graftable artery.  7.  The circumflex provides a first marginal that has segmental 70%      narrowing throughout the whole proximal vessel but distally is a very      large-caliber vessel that is  suitable for grafting. The second marginal      branch is subtotally occluded with 99% narrowing and evidence of a clot      distal to the occlusion.   CONCLUSION:  1.  Preserved overall left ventricular function without segmental wall      motion abnormality.  2.  Severe three-vessel coronary artery disease. Subtotal occlusion of the      second obtuse marginal. Significant disease of the obtuse marginal-1,      diagonal and left anterior descending artery, and significant disease of      the midright coronary.   DISPOSITION:  I reviewed the films with the family and also discussed the  case and reviewed the films with the patient. Given the extensiveness of the  disease, and his presentation, my leaning would be in the fraction of  revascularization surgery. This has been discussed with the patient and his  family and a surgical consultation will be obtained.      Arturo Morton. Riley Kill, M.D. [facility]  Electronically Signed     TDS/MEDQ  D:  10/31/2005  T:  10/31/2005  Job:  [id number]   cc:   CV Laboratory   Patient's medical record   Reuben Likes, M.D.  Fax: [phone]   Jesse Sans. Wall, M.D.  [address]  [address]  [place]  [place] [address]

## 2011-01-13 NOTE — Consult Note (Signed)
NAMEBRIEN, LOWE NO.:  [id number]   MEDICAL RECORD NO.:  [id number]          PATIENT TYPE:  INP   LOCATION:  2918                         FACILITY:  [facility]   PHYSICIAN:  Salvatore Decent. Cornelius Moras, M.D. DATE OF BIRTH:  [birth year]   DATE OF CONSULTATION:  10/31/2005  DATE OF DISCHARGE:                                   CONSULTATION   REQUESTING PHYSICIAN:  Dr. Bonnee Quin.   PRIMARY CARE PHYSICIAN:  Dr. Leslee Home.   REASON FOR CONSULTATION:  Severe three vessel coronary artery disease with  class IV unstable angina.   HISTORY OF PRESENT ILLNESS:  Mr. Hantz is a 77 year old retired white  male from [place] with no previous history of coronary artery disease and  risk factors notable only for history of hypertension and remote history of  tobacco use. The patient also has newly diagnosed mild hyperlipidemia. The  patient states he was in his usual state of health until October 26, 2005 at  which time he first began to develop substernal and epigastric pressure-like  discomfort that he initially attributed to indigestion and constipation. The  pain seemed to persist for 24-48 hours despite the fact that he tried  laxatives and got his bowels cleaned out and took antacids. The pain seemed  to become more localized to the chest and at times radiated up into the  neck. Over the evening hours of October 28, 2005, the pain seemed to persist  such that he could not sleep and he was up and down all night long.  Ultimately, he presented to the emergency room at [facility] in  [place], [place], as at the time the patient was [place]  [place]. There he was evaluated by Dr. Thomos Lemons.  The patient was noted to have a resting electrocardiogram with sinus rhythm  and some subtle ST-segment changes in inferior leads. He was also notably  hypertensive. His symptoms persisted despite administration of GI cocktail.  He  underwent abdominal and pelvic CT scan that showed only what appeared to  be chronic right-sided hydronephrosis with no other significant findings. He  was admitted to the hospital with presumed acute coronary syndrome. Serial  cardiac enzymes remain negative. The patient was transferred to [facility] for further management and therapy. At times, his symptoms  had been relieved with nitroglycerin. He was taken for cardiac  catheterization this morning by Dr. Riley Kill. He was found to have severe  three-vessel coronary artery disease and normal left ventricular function.  Cardiac surgical consultation has been requested to consider surgical  revascularization.   REVIEW OF SYSTEMS:  GENERAL: The patient reports normal appetite. He has not  been gaining or losing weight recently. He does admit that he seems to get  tired more easily than he used to. CARDIAC: Notable for the symptoms of  substernal chest pressure radiating to the neck that have for most part been  persistent since October 26, 2005, although they have now resolved with  nitroglycerin therapy while the patient remains in the hospital. The patient  reports  some mild associated shortness of breath but otherwise denies  shortness of breath either with activity or at rest. He denies history of  PND, orthopnea, lower extremity edema, palpitations, or syncope.  RESPIRATORY: Negative. The patient denies productive cough, hemoptysis,  wheezing. GASTROINTESTINAL: Notable only for intermittent constipation. The  patient has no difficulty swallowing. He has history of symptoms of reflux  and been well controlled on medical therapy. He denies hematochezia,  hematemesis, melena. MUSCULOSKELETAL: Notable only for some mild chronic  arthralgias involving his right shoulder. NEUROLOGIC: Negative. The patient  denies history of transient monocular blindness or transient numbness or  weakness involving either upper or lower  extremity. GENITOURINARY: Notable  for some problems with chronic right-sided hydronephrosis that dates back to  his youth. He has been followed by Dr. Vic Blackbird and apparently had  a ureteral stent not too long ago that has since then been removed. He has  not had problems recently. HEENT: Negative. The patient wears glasses for  reading. He has dentures. INFECTIOUS: Negative. PSYCHIATRIC: Negative.   PAST MEDICAL HISTORY:  1.  Hypertension.  2.  GE reflux disease.  3.  Chronic right-sided hydronephrosis.  4.  Newly discovered hyperlipidemia.   PAST SURGICAL HISTORY:  1.  Cholecystectomy.  2.  Appendectomy.  3.  Bilateral inguinal hernia repair.  4.  Multiple transurethral procedures for ureteral obstruction.   FAMILY HISTORY:  Notable that the patient's mother suffered from coronary  artery disease, although not at a young age.   SOCIAL HISTORY:  The patient is married and lives with his wife here in  [place]. 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 He remains active physically and enjoys golfing. He has a remote  history of tobacco use, although quit smoking in 1979. He denies excessive  alcohol consumption.   MEDICATIONS PRIOR TO ADMISSION:  1.  Aspirin.  2.  Quinapril.  3.  Prilosec.   DRUG ALLERGIES:  1.  IV CONTRAST which causes throat swelling and rash.  2.  SULFA drugs.  3.  CODEINE.   PHYSICAL EXAM:  The patient is a well-appearing white male who appears his  stated age in no acute distress. He is afebrile in normal sinus rhythm. Most  recent blood pressure documented is 116/70. HEENT exam is grossly  unremarkable. The neck is supple. There is no cervical nor supraclavicular  lymphadenopathy. There are no jugular venous distension. No carotid bruits  are noted. Auscultation of the chest includes clear and symmetrical breath  sounds bilaterally. No wheezes or rhonchi noted. Cardiovascular exam includes regular rate and  rhythm. No murmurs, rubs or gallops are  appreciated. The abdomen is soft and nontender. There are no palpable  masses. Bowel sounds are present. The extremities are warm and well-  perfused. There are no lower extremity edema. Distal pulses are palpable in  both lower legs in the dorsalis pedis and posterior tibial position. There  is no sign of significant venous insufficiency. The skin is clean, dry  healthy-appearing throughout. Rectal and GU exams are both deferred.  Neurologic examination is grossly nonfocal.   DIAGNOSTIC TEST:  Cardiac catheterization performed by Dr. Riley Kill today is  reviewed. This demonstrates severe three-vessel coronary artery disease with  coronary anatomy relatively unfavorable for percutaneous coronary  intervention. There is normal left ventricular function. Specifically, there  are tandem 75-80% lesions in the mid-left anterior descending coronary  artery which developed after takeoff of the large first diagonal branch and  the first  septal perforating branch. The first diagonal branch is a large  vessel which bifurcates. The lateral subbranch is larger and has a 50-60%  proximal stenosis. There is 90% proximal stenosis of the small medial  subbranch. There is long segment 75-80% stenosis of a large high circumflex  marginal branch. There is subtotal occlusion of the large second circumflex  marginal branch. There is right dominant coronary circulation. There is 50%  proximal stenosis and 60-70% stenosis of mid-right coronary artery. There is  70-80% ostial stenosis of the posterior descending coronary artery and 70%  proximal stenosis of a large right posterolateral branch. Left ventricular  function appears normal with no significant wall motion abnormalities.   IMPRESSION:  Severe three-vessel coronary artery disease with normal left  ventricular function and class IV unstable angina. I believe that Mr.  Fortunato would best be treated by coronary  artery bypass grafting.   PLAN:  I have outlined options at length with Mr. Vandermeulen here in the cath  lab holding area this afternoon. Alternative treatment strategies have been  discussed. He understands and accepts all associated risks of surgery  including but not limited to risk of death, stroke, myocardial infarction,  congestive heart failure, respiratory failure, pneumonia, bleeding requiring  blood transfusion, arrhythmia, infection, and recurrent coronary artery  disease. All their questions been addressed. We tentatively plan to proceed  with surgery tomorrow.      Salvatore Decent. Cornelius Moras, M.D.  Electronically Signed     CHO/MEDQ  D:  10/31/2005  T:  11/01/2005  Job:  [id number]   cc:   Arturo Morton. Riley Kill, M.D. [facility]  [address]  [address]  [place]  [place] [address]   Rollene Rotunda, M.D.  [address]  [address]  [place]  [place] [address]   Reuben Likes, M.D.  Fax: [phone]   Thomos Lemons, M.D.

## 2011-01-13 NOTE — Assessment & Plan Note (Signed)
[facility]                              CARDIOLOGY OFFICE NOTE   NAME:Averitt, MASSIMO HARTLAND                     MRN:          [id number]  DATE:05/22/2006                            DOB:          [birth year]    Mr. Mcglasson returns today for further management of his coronary artery  disease.  He is having no angina, and his chest wall soreness from his  bypass has improved.  He is about 6 months out from surgery.   He had been having a lot of low back pain.  There is no radiation.  He is  taking Advil intermittently 3-4 at a time.   His medications are:  1. Aspirin 325 mg a day.  2. Accupril 20 mg a day.  3. Pravachol 80 mg a day.  4. Prilosec over-the-counter 20 mg a day.  5. Multivitamin.  6. Saw palmetto.  7. Toprol XL 75 mg a day.   PHYSICAL EXAMINATION:  VITAL SIGNS:  His blood pressure is 128/75, pulse is  58 and regular.  His weight is 184.  GENERAL:  He has a ruddy complexion.  He looks tired.  HEENT:  Otherwise unremarkable.  NECK:  Carotid upstroke are equal bilaterally.  No bruits, no JVD.  Thyroid  is not enlarged.  CHEST:  Sternotomy site is well-healed.  CARDIAC:  He has normal S1, S2, without murmur, rub, or gallop.  LUNGS:  Clear.  ABDOMEN:  Soft.  EXTREMITIES:  There is no cyanosis, clubbing or edema.  Pulses is intact.   ASSESSMENT AND PLAN:  Mr. Rosenbloom is doing well.  We will plan on seeing  him back in 6 months for a year follow-up from his surgery.  He will  continue to follow up with Dr. Lorenz Coaster.  His lipids were at goal last check.   I have given him Celebrex 200 mg a day for his low back pain for a month  with one refill.  I think this is the safer drug based on studies than  taking 600-800 mg of Advil at a pop.  If it helps him, I have asked him to  talk to Dr. Lorenz Coaster about refilling it long-term.                               Thomas C. Daleen Squibb, MD, [facility]    TCW/MedQ  DD:  05/22/2006  DT:  05/24/2006  Job #:   [id number]   cc:   Reuben Likes, M.D.

## 2011-01-13 NOTE — Op Note (Signed)
Jose [name], Jose [name] NO.:  [id number]   MEDICAL RECORD NO.:  [id number]          PATIENT TYPE:  INP   LOCATION:  2308                         FACILITY:  [facility]   PHYSICIAN:  Salvatore Decent. Cornelius Moras, M.D. DATE OF BIRTH:  02/27/1931   DATE OF PROCEDURE:  11/01/2005  DATE OF DISCHARGE:                                 OPERATIVE REPORT   PREOPERATIVE DIAGNOSIS:  Severe three-vessel coronary artery disease with  class IV unstable angina.   POSTOPERATIVE DIAGNOSIS:  Severe three-vessel coronary artery disease with  class IV unstable angina with patent foramen ovale.   PROCEDURE:  Median sternotomy for coronary artery bypass grafting x6 (left  internal mammary artery to distal left anterior descending coronary artery,  saphenous vein graft to first diagonal branch, saphenous vein graft to first  circumflex marginal branch with sequential saphenous vein graft to second  circumflex marginal branch, saphenous vein graft to posterior descending  coronary artery with sequential saphenous vein graft to right posterolateral  branch, endoscopic saphenous vein harvest from right thigh and right lower  leg) and closure of patent foramen ovale.   SURGEON:  Dr. Purcell Nails   ASSISTANT:  Ms. Lujean Rave   ANESTHESIA:  General.   BRIEF CLINICAL NOTE:  The patient is a 77 year old gentleman with no  previous history of coronary artery disease who is followed by Dr. Leslee Home with history of hypertension and newly discovered hyperlipidemia. The  patient presents with new-onset symptoms of chest pain consistent with  unstable angina, initially beginning March1,2007. The patient was initially  evaluated by Dr. Rhea Belton at the [facility] emergency room and  ultimately transferred [facility] for further management.  The patient underwent cardiac catheterization by Dr. Bonnee Quin on  [phone] demonstrating severe three-vessel coronary artery  disease with  normal left ventricular function. A full consultation note has been dictated  previously. The patient and his family have been counseled at length  regarding the indications, risks, and potential benefits of coronary artery  bypass grafting. Alternative treatment strategies been discussed.  They  understand and accept all associated risks of surgery and desired to proceed  as described.   OPERATIVE FINDINGS:  1.  Large patent foramen ovale discovered serendipitously at transesophageal      echocardiogram with left-to-right shunting.  2.  Normal left ventricular size and function.  3.  Good-quality left internal mammary artery and saphenous vein conduit.  4.  Fair to good quality target vessels for bypass grafting.   OPERATIVE NOTE IN DETAIL:  The patient was brought to the operating room on  the above-mentioned date and central monitoring was established by the  anesthesia service under the care and direction of Dr. Sheldon Silvan.  Specifically, a Swan-Ganz catheter is placed through the right internal  jugular approach. A radial arterial line is placed. Intravenous antibiotics  were administered. Following induction with general endotracheal anesthesia,  a Foley catheter was placed. The patient's chest, abdomen, both groins, and  both lower extremities were prepared, draped in sterile manner.   Baseline transesophageal echocardiogram is performed by Dr. Ivin Booty.  This  demonstrates normal left ventricular size and function. There are no  significant abnormalities involving the aortic valve or mitral valve. There  is an obvious patent foramen ovale with left-to-right shunting. No other  significant abnormalities were appreciated.   A median sternotomy incision is performed and left internal mammary artery  was dissected from the chest wall and prepared for bypass grafting. The left  internal mammary artery is good-quality conduit. Simultaneously saphenous  vein is obtained  from the patient's right thigh and right lower leg using  endoscopic vein harvest technique. The saphenous vein is good-quality  conduit. After the saphenous vein is removed, the small surgical incisions  in the right lower extremity are closed in multiple layers with running  absorbable suture.   The patient is heparinized systemically. The pericardium was opened. The  ascending aorta is normal in appearance. The ascending aorta is cannulated  for cardiopulmonary bypass. Venous cannula was placed in the superior vena  cava. The second venous cannula was placed low in the right atrium with tip  extending down the inferior vena cava. Retrograde cardioplegic catheter was  placed through the right atrium into the coronary sinus. Cardiopulmonary  bypass was begun and the surface of the heart was inspected. Distal sites  were selected for coronary bypass grafting. Vessel loops were placed around  the superior vena cava and inferior vena cava. A temperature probe was  placed left ventricular septum. Cardioplegic catheter was placed in the  ascending aorta.   The patient is cooled to 32 degrees systemic temperature. Aortic crossclamp  was applied and cold blood cardioplegia is administered initially in  antegrade fashion through the aortic root. Iced saline slush was applied for  topical hypothermia. Supplemental cardioplegia is administered retrograde  through the coronary sinus catheter. The initial cardioplegic arrest and  myocardial cooling are felt to be excellent. Repeat doses of cardioplegia  administered intermittently throughout the crossclamp portion of the  operation through the aortic root, down the subsequent placed vein grafts,  and retrograde through the coronary sinus catheter to maintain left  ventricular septal temperature below 15 degrees centigrade.   The following distal coronary anastomoses were performed: 1.  The posterior descending coronary artery is grafted with a  saphenous      vein graft in a side-to-side fashion. This vessel measures 1.7 mm at the      site of distal bypass. It is moderately diffusely diseased with plaque      but a 1.5-mm probe will pass easily in both directions.  2.  The posterolateral branch off the distal right coronary artery is      grafted using a sequential saphenous vein graft off of the vein placed      in the posterior descending coronary artery. This vessel measures 1.5 mm      in diameter at the site of distal bypass and is good quality.  3.  The first diagonal branch of the left anterior descending coronary      artery is grafted with a saphenous vein graft in end-to-side fashion.      This vessel actually is a bifid system in the lateral subbranch of the      vessel which is grafted. This is the much larger branch of the two. At      the site of distal bypass it measures 1.5 mm in diameter and is good      quality. The medial subbranch is quite small and was not grafted.  4.  The first circumflex marginal branch is grafted with a saphenous vein      graft in a side-to-side fashion. This vessel measures 1.5 mm in diameter      and is good quality at the site of distal bypass.  5.  The second circumflex marginal branch is grafted using a sequential      saphenous vein graft off of vein placed to the first circumflex marginal      branch. This vessel measured 1.4 mm in diameter and is good quality at      the site of distal bypass.  6.  The distal left anterior descending coronary artery is grafted with left      internal mammary artery in end-to-side fashion. This vessel measures 1.7      mm in diameter and is good quality at the site of distal bypass.   An oblique right atriotomy incision is performed. The interatrial septum was  exposed and inspected. There is an obvious patent foramen ovale which  measures approximately 6 or 7 mm in diameter. There is a single hole and the  rest of the interatrial septum appears  normal. The patent foramen ovale is  closed using a two-layer closure of running 3-0 Prolene suture. The right  atriotomy incision is closed using a two-layer closure with running 3-0  Prolene suture.   All three proximal saphenous vein anastomoses were performed directly to the  ascending aorta prior to removal of the aortic crossclamp. The left  ventricular septal temperature rises rapidly with reperfusion of the left  internal mammary artery. One final dose of warm retrograde hot shot  cardioplegia is administered. The heart was briefly allowed to fill the  lungs ventilated to evacuate any residual air through the aortic root.  Aortic crossclamp was removed after a total crossclamp time of 93 minutes.   The heart began to beat spontaneously without need for cardioversion. The  retrograde cardioplegic catheter was removed. All proximal and distal  coronary anastomoses were inspected for hemostasis and appropriate graft orientation. Epicardial pacing wires were fixed to the right ventricular  outflow tract and to the right atrial appendage. The patient is rewarmed to  37 degrees centigrade temperature. The patient is weaned from  cardiopulmonary bypass without difficulty. The patient's rhythm at  separation from bypass is sinus rhythm with second-degree AV block. AV  sequential pacing was employed. Total cardiopulmonary bypass time for the  operation is 117 minutes. Follow-up transesophageal echocardiogram performed  by Dr. Ivin Booty after separation from bypass demonstrates normal left  ventricular function. There are no other abnormalities. The patent foramen  ovale is gone and there is no sign of any communication between the left and  right atrium. There is no significant residual air in the left heart  circulation.   The venous and arterial cannulae are removed uneventfully. Protamine was  administered to reverse anticoagulation. The mediastinum and left chest are  irrigated with  saline solution containing vancomycin. Meticulous surgical  hemostasis ascertained. The mediastinum and left chest are drained using  three chest tubes exited through separate stab incisions inferiorly. The  soft tissues anterior to the aorta are reapproximated loosely.   The On-Q continuous pain management system is utilized for postoperative  pain control.  Two 10-inch Silastic catheters supplied with the On-Q kit are  tunneled into the deep subcutaneous tissues and positioned immediately  anterior to the costal cartilages just lateral to the lateral border of the  sternum on either side. Each of these  catheters were flushed with 5 mL of  0.5% bupivacaine solution and ultimately connected to a continuous infusion  pump. The sternum was reapproximated with single strength sternal wire. The  soft tissues anterior to this sternum are closed in multiple layers and the  skin was closed with a subcuticular skin closure. The patient tolerated the  procedure well and  was transported to the surgical intensive care unit in stable condition.  There are no intraoperative complications. All sponge, instrument and needle  counts are verified correct at completion of the operation. No blood  products were administered.      Salvatore Decent. Cornelius Moras, M.D.  Electronically Signed     CHO/MEDQ  D:  11/01/2005  T:  11/02/2005  Job:  [id number]   cc:   Reuben Likes, M.D.  Fax: [phone]   Arturo Morton. Riley Kill, M.D. [facility]  [address]  [address]  [place]  [place] [address]

## 2011-01-13 NOTE — Op Note (Signed)
[name], [name] NO.:  [id number]   MEDICAL RECORD NO.:  [id number]          PATIENT TYPE:  INP   LOCATION:  2308                         FACILITY:  [facility]   PHYSICIAN:  Sheldon Silvan, M.D.      DATE OF BIRTH:  [birth year]   DATE OF PROCEDURE:  11/01/2005  DATE OF DISCHARGE:                                 OPERATIVE REPORT   PROCEDURE:  Intraoperative transesophageal echocardiography (TEE).   INDICATIONS FOR PROCEDURE:  Jose [name] was brought to the operating room  today by Dr. Tressie Stalker for coronary artery bypass grafting.  It was felt  that during his operation that he would benefit from use of TEE for both  diagnostic and monitoring purposes.  I questioned him concerning esophageal  problems, and he reported no history of any positive issues.  He has never  had any bleeding from his GI tract or coughing up blood.  He has no loose  teeth.  He understood that I would pass a tube that was an ultrasound probe  into his esophagus and leave it during the case and then remove it before  transportation to the ICU.   DESCRIPTION OF PROCEDURE:  After adequate placement of monitoring devices,  the patient was taken to the operating room.  He was placed under general  anesthesia, and his trachea was intubated appropriately.   The [employer] TEE probe was sheathed and lubricated and then  passed easily through his oropharynx and into his esophagus without  difficulty.  There was no trauma noted.   The heart was imaged, and the left ventricle was seen to be contracting  well.  There was no wall motion abnormality noted.  There was no pericardial  effusion noted.   The mitral valve was imaged and moved appropriately, with no chordae  problems noted.  The valves apposed each other appropriately.  There was no  calcification noted.  On color flow exam in the long axis, there was only  trace regurgitation noted into the left atrium.   The left  atrial appendage was imaged and was seen to be free of smoke or  thrombus.   The aortic valve was imaged and was seen to be tricuspid.  There was very  little sclerosis noted along the edges of the cusps.  In the long axis, the  valve moved quite well.  On color flow, there was no regurgitation noted.  There was no dilation of the aorta noted.   The interatrial septum was imaged.  On color flow exam across the lower  portion of the septum, there was a PFO noted with a left to right movement.   The right ventricle was examined, and the tricuspid valve moved well.  There  was only trace regurgitation noted on color flow exam.   The patient was placed on cardiopulmonary bypass by Dr. Cornelius Moras, and the bypass  procedures were accomplished.   On weaning from the bypass machine, the left ventricle was again imaged, and  contraction was appropriate.  There was no wall motion abnormality noted in  the LV.  The  valves were unchanged.   Examination of the interatrial septum after direct closure by Dr. Cornelius Moras  showed there to be no communication on Doppler flow exam between the left  and right atrium.   The patient tolerated weaning from bypass adequately.  Prior to leaving the  operating room, the probe was removed without difficulty.  The patient was  taken to the SICU in good condition.      Sheldon Silvan, M.D.  Electronically Signed     DC/MEDQ  D:  11/01/2005  T:  11/02/2005  Job:  [id number]

## 2011-01-13 NOTE — Consult Note (Signed)
Jose [name], WINTERTON NO.:  [id number]   MEDICAL RECORD NO.:  [id number]          PATIENT TYPE:  INP   LOCATION:  2099                         FACILITY:  [facility]   PHYSICIAN:  Jordan Hawks. Elnoria Howard, MD    DATE OF BIRTH:  [birth year]   DATE OF CONSULTATION:  10/31/2005  DATE OF DISCHARGE:                                   CONSULTATION   CONSULTING PHYSICIAN:  Jordan Hawks. Elnoria Howard, M.D.   REFERRING PHYSICIAN:  Arturo Morton. Riley Kill, M.D. [facility].   REASON FOR CONSULTATION:  Nausea and vomiting, post catheterization.   HISTORY OF PRESENT ILLNESS:  This is a 77 year old white male with a past  medical history of hypertension, hyperlipidemia, nonfunctioning right  kidney, and gastroesophageal reflux disease who was transferred from an  outside hospital for chest pressure.  The patient states that on this past  Thursday, he started experiencing what he felt was indigestion, and he took  over-the-counter medications with some minimal type of relief.  Unfortunately with the progression over the weekend, the patient states that  his pressure continued and this prompted him to be evaluated in the  hospital.  He was subsequently seen at an outside hospital and noted to have  unstable angina which prompted his transfer over to [facility] for  further evaluation.  The patient underwent a cardiac catheterization, on  October 31, 2005, with findings of severe 3-vessel coronary artery disease.  After the catheterization, the patient experienced worsening of his nausea  and vomiting.  The patient states that he did have nausea and vomiting when  he was started on the nitroglycerin patch at the outside hospital.  That  when the patch was removed at that hospital, his symptoms did resolve.  Unfortunately, his chest pressure continued which prompted the continuation  of the nitroglycerin.  At this time, he has maintained on the nitroglycerin  drip and he states that the resultant headache prompts  the nausea.  He  denies having any problems with nausea and vomiting before his admission to  the hospital and he states that he was in his usual state of health.  The  patient denies any fever, diarrhea, or abdominal pain.  He has a history of  a right upper quadrant pain and was evaluated by Dr. Loreta Ave, in 2002, with  subsequent cholecystectomy by Dr. Avel Peace.  After that time, the  patient's pain overall did resolve, however, he had intermittent  exacerbations throughout the past 4-5 years but nothing that warranted  further evaluation or treatment.  At this time, the patient states that he  does have a headache but is overall stable.   PAST MEDICAL HISTORY:  As stated above.   PAST SURGICAL HISTORY:  As stated above.   FAMILY HISTORY:  Significant for lymphoma and prostate cancer in his father.  Coronary artery disease and hypertension.   SOCIAL HISTORY:  The patient does live in [place] with his wife and he is  a retired Airline pilot.  He does have a history of tobacco use for  approximately 30 years.  He does drink 1-2 glasses of  wine per year.  No  illicit drugs or herbal medications.   REVIEW OF SYSTEMS:  Significant for the chest pressure, headache, nausea,  vomiting.  No abdominal pain.  No dysuria or dysarthria, blurry vision, no  dizziness, no fevers, chills, rashes, arthritis.   MEDICATIONS:  Aspirin.  He is on 2B 3A protocol.  Lovenox, Lisinopril,  pantoprazole, Zocor, and he received one dose of methylprednisolone and  Zofran.   ALLERGIES:  1.  CONTRAST MEDIA.  2.  SULFA.  3.  CODEINE.  Which can result in rash and shortness of breath.   PHYSICAL EXAMINATION:  VITAL SIGNS:  Stable.  GENERAL:  The patient appears to be mildly uncomfortable with a wet rag on  his forehead.  HEENT:  Normocephalic atraumatic.  Extraocular muscles intact.  Pupils  equal, round, reactive to light.  NECK:  Supple.  No lymphadenopathy.  LUNGS:  Clear to auscultation  bilaterally.  CARDIOVASCULAR:  Regular rate and rhythm without murmurs, gallops, or rubs.  ABDOMEN:  Flat, soft, nontender, nondistended.  EXTREMITIES:  No clubbing, cyanosis, or edema.   LABORATORY VALUES:  On __________  white blood cell count was 8.1,  hemoglobin 13.3, MCV is 90.0, platelet count 184.  Sodium is 133, potassium  4.2, chloride 102, CO2 25, glucose 170, BUN is 14, creatinine is 1.3.  Total  bilirubin 0.7, alk phos is 89, AST is 27, ALT 17.  Amylase is 43, lipase 17.  PT is 13.7, INR 1.0, PTT is 38.   IMPRESSION:  1.  Nausea and vomiting which is medication induced.  2.  Severe three-vessel coronary artery disease.  3.  Hypertension.  4.  Hyperlipidemia.  5.  Gastroesophageal reflux disease.   It is apparent from the patient's history that his nausea and vomiting are  secondary to the nitroglycerin.  When the patient had the nitroglycerin  patch removed from him, his headache and nausea and vomiting did resolve.  Unfortunately, the discontinuation of the nitroglycerin is not an option at  this time as this will have an adverse effect in regards to his cardiac  status.  I can not determine any other evidence for a GI source for his  nausea and vomiting in that there is no evidence of an obstruction, or  esophagitis, or a gastroenteritis at this time.  I do agree with the dose of  the methylprednisolone as he does have a history of allergy to contrast  media, ut he does not appear to have any allergic signs or symptoms at this  time.  The Zofran also appears to be beneficial currently.   PLAN:  1.  Continue symptomatic treatment with Zofran.  2.  Pain medications as needed.      Jordan Hawks Elnoria Howard, MD  Electronically Signed     PDH/MEDQ  D:  10/31/2005  T:  11/01/2005  Job:  [id number]   cc:   Arturo Morton. Riley Kill, M.D. [facility]  [address]  [address]  [place]  [place] [address]

## 2011-01-13 NOTE — Assessment & Plan Note (Signed)
[facility]                            CARDIOLOGY OFFICE NOTE   NAME:Barraclough, EWIN REHBERG                     MRN:          [id number]  DATE:11/12/2006                            DOB:          [birth year]    Mr. Snellings returns today for further management of his coronary artery  disease.   He is having no angina and his chest wall soreness has resolved. He is a  year out from bypass surgery. He had normal left ventricular systolic  function prior to surgery.   He saw Dr. Lorenz Coaster the other day, who drew blood work. He is following  him for his hypertension, hyperlipidemia.   His biggest problem is some right shoulder pain and bursitis as well as  some sciatica in his left leg. He is really limited right now with  walking.   MEDICATIONS:  1. Aspirin 325 a day.  2. Accupril 20 mg a day.  3. Pravachol 80 mg a day.  4. Prilosec over-the-counter.  5. Multivitamin daily.  6. Saw palmetto.  7. Toprol XL 75 mg a day.   PHYSICAL EXAMINATION:  His blood pressure is 120/80. His pulse is 54 and  sinus brady and his EKG confirms with no other changes. His weight is  187. His face is slightly ruddy.  HEENT: Normocephalic, atraumatic. PERRLA. Extra-ocular movements intact.  Sclerae are clear. Facial symmetry is normal. Dentition is satisfactory.  NECK: Shows no JVD.  Carotids are full without bruits.  Thyroid is not  enlarged.  Sternotomy site is stable.  HEART: Reveals a poorly appreciated PMI. He has a soft S1, S2.  LUNGS:  Clear.  ABDOMEN: Soft with good bowel sounds. There is no midline bruit. There  is no hepatomegaly.  EXTREMITIES: Reveals no cyanosis, clubbing or edema. Pulses are intact.  NEURO: Intact.   Mr. Strout is doing well from a cardiac standpoint. I have made no  changes. Will obtain an adenosine Myoview for baseline followup of his  coronary artery bypass grafting. We will plan on seeing him back in a  year.     Thomas C. Daleen Squibb,  MD, [facility]  Electronically Signed    TCW/MedQ  DD: 11/12/2006  DT: 11/12/2006  Job #: [id number]   cc:   Reuben Likes, M.D.

## 2012-01-03 ENCOUNTER — Encounter: Payer: Self-pay | Admitting: Cardiology

## 2012-01-03 ENCOUNTER — Ambulatory Visit (INDEPENDENT_AMBULATORY_CARE_PROVIDER_SITE_OTHER): Payer: Medicare Other | Admitting: Cardiology

## 2012-01-03 VITALS — BP 132/72 | HR 60 | Ht 70.5 in | Wt 179.0 lb

## 2012-01-03 DIAGNOSIS — I2510 Atherosclerotic heart disease of native coronary artery without angina pectoris: Secondary | ICD-10-CM

## 2012-01-03 DIAGNOSIS — E785 Hyperlipidemia, unspecified: Secondary | ICD-10-CM

## 2012-01-03 DIAGNOSIS — I10 Essential (primary) hypertension: Secondary | ICD-10-CM

## 2012-01-03 NOTE — Progress Notes (Signed)
HPI [name] comes in today for evaluation and management of his coronary artery disease. He is now about 6 years out from bypass surgery.  He is having no angina or ischemic symptoms. He denies orthopnea, PND and only complains of minimal edema at times. Blood work followed by primary care which she is getting next week.  He is very compliant with his medications. He inquires today about antioxidants which I reviewed. Advised away from alternative gimmicks.  Past Medical History  Diagnosis Date  . Coronary artery disease   . Chest pain   . Hypertension   . Other malaise and fatigue   . Psychosexual dysfunction with inhibited sexual excitement     Current Outpatient Prescriptions  Medication Sig Dispense Refill  . amLODipine (NORVASC) 5 MG tablet Take 5 mg by mouth daily.        Marland Kitchen aspirin 325 MG tablet Take 325 mg by mouth daily.        . Multiple Vitamin (MULTIVITAMIN) capsule Take 1 capsule by mouth daily.        . niacin (NIASPAN) 500 MG CR tablet Take 500 mg by mouth daily.        Marland Kitchen omeprazole (PRILOSEC) 20 MG capsule Take 20 mg by mouth daily.        . polyethylene glycol (MIRALAX / GLYCOLAX) packet Take 17 g by mouth daily.      . pravastatin (PRAVACHOL) 80 MG tablet Take 80 mg by mouth daily.        . quinapril (ACCUPRIL) 20 MG tablet Take 20 mg by mouth at bedtime.        . saw palmetto 160 MG capsule Take 160 mg by mouth 2 (two) times daily.        . tadalafil (CIALIS) 5 MG tablet Take 5 mg by mouth daily.          Allergies  Allergen Reactions  . Codeine     REACTION: Reaction not known  . Iohexol      Desc: THROAT SWELLS AND STOPS BREATHING   . Sulfonamide Derivatives     REACTION: Reaction not known    Family History  Problem Relation Age of Onset  . Heart disease Mother     History   Social History  . Marital Status: Married    Spouse Name: N/A    Number of Children: N/A  . Years of Education: N/A   Occupational History  . retired    Social  History Main Topics  . Smoking status: Former Smoker    Quit date: 08/29/1977  . Smokeless tobacco: Not on file  . Alcohol Use: No  . Drug Use: No  . Sexually Active: Not on file   Other Topics Concern  . Not on file   Social History Narrative  . No narrative on file    ROS ALL NEGATIVE EXCEPT THOSE NOTED IN HPI  PE  General Appearance: well developed, well nourished in no acute distress HEENT: symmetrical face, PERRLA, good dentition  Neck: no JVD, thyromegaly, or adenopathy, trachea midline Chest: symmetric without deformity Cardiac: PMI non-displaced, RRR, normal S1, S2, no gallop or murmur Lung: clear to ausculation and percussion Vascular: all pulses full without bruits  Abdominal: nondistended, nontender, good bowel sounds, no HSM, no bruits Extremities: no cyanosis, clubbing, minimal edema, no sign of DVT, varicosities bilaterally Skin: normal color, no rashes Neuro: alert and oriented x 3, non-focal Pysch: normal affect  EKG Normal sinus rhythm, nonspecific ST segment changes,  no change. BMET No results found for this basename: na, k, cl, co2, glucose, bun, creatinine, calcium, gfrnonaa, gfraa    Lipid Panel  No results found for this basename: chol, trig, hdl, cholhdl, vldl, ldlcalc    CBC No results found for this basename: wbc, rbc, hgb, hct, plt, mcv, mch, mchc, rdw, neutrabs, lymphsabs, monoabs, eosabs, basosabs

## 2012-01-03 NOTE — Assessment & Plan Note (Signed)
Stable. Continue aggressive secondary prevention. Return to the office in one year.

## 2012-01-03 NOTE — Patient Instructions (Signed)
Your physician recommends that you continue on your current medications as directed. Please refer to the Current Medication list given to you today.  Your physician wants you to follow-up in: 1 year. You will receive a reminder letter in the mail two months in advance. If you don't receive a letter, please call our office to schedule the follow-up appointment.  

## 2012-01-12 ENCOUNTER — Ambulatory Visit
Admission: RE | Admit: 2012-01-12 | Discharge: 2012-01-12 | Disposition: A | Discharge status: Home / Self Care | Payer: Medicare Other | Source: Ambulatory Visit | Attending: Family Medicine | Admitting: Family Medicine

## 2012-01-12 ENCOUNTER — Other Ambulatory Visit: Payer: Self-pay | Admitting: Family Medicine

## 2012-01-12 DIAGNOSIS — R0689 Other abnormalities of breathing: Secondary | ICD-10-CM

## 2012-05-27 ENCOUNTER — Ambulatory Visit
Admission: RE | Admit: 2012-05-27 | Discharge: 2012-05-27 | Disposition: A | Discharge status: Home / Self Care | Payer: Medicare Other | Source: Ambulatory Visit | Attending: Family Medicine | Admitting: Family Medicine

## 2012-05-27 ENCOUNTER — Other Ambulatory Visit: Payer: Self-pay | Admitting: Family Medicine

## 2012-05-27 DIAGNOSIS — M79604 Pain in right leg: Secondary | ICD-10-CM

## 2012-05-27 DIAGNOSIS — M545 Low back pain: Secondary | ICD-10-CM

## 2012-05-28 ENCOUNTER — Ambulatory Visit
Admission: RE | Admit: 2012-05-28 | Discharge: 2012-05-28 | Disposition: A | Discharge status: Home / Self Care | Payer: Medicare Other | Source: Ambulatory Visit | Attending: Family Medicine | Admitting: Family Medicine

## 2012-05-28 DIAGNOSIS — M545 Low back pain: Secondary | ICD-10-CM

## 2012-05-31 ENCOUNTER — Other Ambulatory Visit: Payer: Self-pay | Admitting: Family Medicine

## 2012-05-31 DIAGNOSIS — IMO0002 Reserved for concepts with insufficient information to code with codable children: Secondary | ICD-10-CM

## 2012-05-31 DIAGNOSIS — M79604 Pain in right leg: Secondary | ICD-10-CM

## 2012-06-05 ENCOUNTER — Inpatient Hospital Stay: Admission: RE | Admit: 2012-06-05 | Payer: Medicare Other | Source: Ambulatory Visit

## 2012-06-13 ENCOUNTER — Ambulatory Visit
Admission: RE | Admit: 2012-06-13 | Discharge: 2012-06-13 | Disposition: A | Discharge status: Home / Self Care | Payer: Medicare Other | Source: Ambulatory Visit | Attending: Family Medicine | Admitting: Family Medicine

## 2012-06-13 DIAGNOSIS — IMO0002 Reserved for concepts with insufficient information to code with codable children: Secondary | ICD-10-CM

## 2012-06-13 DIAGNOSIS — M79604 Pain in right leg: Secondary | ICD-10-CM

## 2012-06-13 MED ORDER — IOHEXOL 180 MG/ML  SOLN
1.0000 mL | Freq: Once | INTRAMUSCULAR | Status: AC | PRN
  Administered 2012-06-13: 1 mL via EPIDURAL

## 2012-06-13 MED ORDER — METHYLPREDNISOLONE ACETATE 40 MG/ML INJ SUSP (RADIOLOG
120.0000 mg | Freq: Once | INTRAMUSCULAR | Status: AC
  Administered 2012-06-13: 120 mg via EPIDURAL

## 2012-07-31 ENCOUNTER — Other Ambulatory Visit: Payer: Self-pay

## 2013-01-14 ENCOUNTER — Other Ambulatory Visit: Payer: Self-pay | Admitting: Family Medicine

## 2013-01-14 DIAGNOSIS — R160 Hepatomegaly, not elsewhere classified: Secondary | ICD-10-CM

## 2013-01-15 ENCOUNTER — Other Ambulatory Visit: Payer: Medicare Other

## 2013-01-29 ENCOUNTER — Ambulatory Visit
Admission: RE | Admit: 2013-01-29 | Discharge: 2013-01-29 | Disposition: A | Discharge status: Home / Self Care | Payer: Medicare Other | Source: Ambulatory Visit | Attending: Family Medicine | Admitting: Family Medicine

## 2013-01-29 DIAGNOSIS — R160 Hepatomegaly, not elsewhere classified: Secondary | ICD-10-CM

## 2013-01-30 ENCOUNTER — Ambulatory Visit (INDEPENDENT_AMBULATORY_CARE_PROVIDER_SITE_OTHER): Payer: Medicare Other | Admitting: Cardiology

## 2013-01-30 ENCOUNTER — Encounter: Payer: Self-pay | Admitting: Cardiology

## 2013-01-30 VITALS — BP 124/66 | HR 58 | Ht 69.0 in | Wt 181.0 lb

## 2013-01-30 DIAGNOSIS — I2510 Atherosclerotic heart disease of native coronary artery without angina pectoris: Secondary | ICD-10-CM

## 2013-01-30 DIAGNOSIS — E785 Hyperlipidemia, unspecified: Secondary | ICD-10-CM

## 2013-01-30 DIAGNOSIS — I10 Essential (primary) hypertension: Secondary | ICD-10-CM

## 2013-01-30 NOTE — Progress Notes (Signed)
HPI Jose [name] returns today for evaluation and management his history of coronary artery disease and coronary bypass surgery.  He's doing remarkably well with no angina or ischemic symptoms. He is on both Niaspan and pravastatin for his hyperlipidemia. This is being followed by primary care with Dr Duaine Dredge.  He's very compliant with his medications. He states his blood pressures also been under good control.    Past Medical History  Diagnosis Date  . Coronary artery disease   . Chest pain   . Hypertension   . Other malaise and fatigue   . Psychosexual dysfunction with inhibited sexual excitement     Current Outpatient Prescriptions  Medication Sig Dispense Refill  . amLODipine (NORVASC) 5 MG tablet Take 5 mg by mouth daily.        Marland Kitchen aspirin 325 MG tablet Take 325 mg by mouth daily.        Marland Kitchen losartan (COZAAR) 100 MG tablet Take 100 mg by mouth daily.      . Multiple Vitamin (MULTIVITAMIN) capsule Take 1 capsule by mouth daily.        . niacin (NIASPAN) 500 MG CR tablet Take 500 mg by mouth daily.        Marland Kitchen omeprazole (PRILOSEC) 20 MG capsule Take 20 mg by mouth daily.        . polyethylene glycol (MIRALAX / GLYCOLAX) packet Take 17 g by mouth daily.      . pravastatin (PRAVACHOL) 80 MG tablet Take 80 mg by mouth daily.        . saw palmetto 160 MG capsule Take 160 mg by mouth 2 (two) times daily.        . tadalafil (CIALIS) 5 MG tablet Take 5 mg by mouth daily.         No current facility-administered medications for this visit.    Allergies  Allergen Reactions  . Iohexol Anaphylaxis     THROAT SWELLS AND STOPS BREATHING   . Codeine Other (See Comments)      Reaction not known  . Sulfonamide Derivatives Other (See Comments)      Reaction not known    Family History  Problem Relation Age of Onset  . Heart disease Mother     History   Social History  . Marital Status: Married    Spouse Name: N/A    Number of Children: N/A  . Years of Education: N/A    Occupational History  . retired    Social History Main Topics  . Smoking status: Former Smoker -- 1.00 packs/day for 35 years    Types: Cigarettes    Quit date: 08/29/1977  . Smokeless tobacco: Never Used  . Alcohol Use: No  . Drug Use: No  . Sexually Active: Not on file   Other Topics Concern  . Not on file   Social History Narrative  . No narrative on file    ROS ALL NEGATIVE EXCEPT THOSE NOTED IN HPI  PE  General Appearance: well developed, well nourished in no acute distress, ruddy complexion HEENT: symmetrical face, PERRLA, good dentition  Neck: no JVD, thyromegaly, or adenopathy, trachea midline Chest: symmetric without deformity Cardiac: PMI non-displaced, RRR, normal S1, S2, no gallop or murmur Lung: clear to ausculation and percussion Vascular: all pulses full without bruits  Abdominal: nondistended, nontender, good bowel sounds, no HSM, no bruits Extremities: no cyanosis, clubbing or edema, no sign of DVT, no varicosities  Skin: normal color, no rashes Neuro: alert and oriented x 3,  non-focal Pysch: normal affect  EKG Sinus bradycardia with sinus arrhythmia otherwise normal EKG, borderline first-degree block BMET No results found for this basename: na, k, cl, co2, glucose, bun, creatinine, calcium, gfrnonaa, gfraa    Lipid Panel  No results found for this basename: chol, trig, hdl, cholhdl, vldl, ldlcalc    CBC No results found for this basename: wbc, rbc, hgb, hct, plt, mcv, mch, mchc, rdw, neutrabs, lymphsabs, monoabs, eosabs, basosabs

## 2013-01-30 NOTE — Assessment & Plan Note (Signed)
Doing well 7 years after bypass surgery. Continue secondary preventative therapy. I'll set him up for a return visit in one year with Dr. Jens Som who is also in his church.

## 2013-01-30 NOTE — Patient Instructions (Addendum)
Your physician wants you to follow-up in: 1 year with Dr. Crenshaw. You will receive a reminder letter in the mail two months in advance. If you don't receive a letter, please call our office to schedule the follow-up appointment.  Your physician recommends that you continue on your current medications as directed. Please refer to the Current Medication list given to you today.  

## 2013-04-22 ENCOUNTER — Ambulatory Visit
Admission: RE | Admit: 2013-04-22 | Discharge: 2013-04-22 | Disposition: A | Discharge status: Home / Self Care | Payer: Medicare Other | Source: Ambulatory Visit | Attending: Family Medicine | Admitting: Family Medicine

## 2013-04-22 ENCOUNTER — Other Ambulatory Visit: Payer: Self-pay | Admitting: Family Medicine

## 2013-04-22 DIAGNOSIS — M25511 Pain in right shoulder: Secondary | ICD-10-CM

## 2013-07-04 ENCOUNTER — Other Ambulatory Visit: Payer: Self-pay | Admitting: Family Medicine

## 2013-07-04 DIAGNOSIS — H539 Unspecified visual disturbance: Secondary | ICD-10-CM

## 2013-07-07 ENCOUNTER — Ambulatory Visit
Admission: RE | Admit: 2013-07-07 | Discharge: 2013-07-07 | Disposition: A | Discharge status: Home / Self Care | Payer: Medicare Other | Source: Ambulatory Visit | Attending: Family Medicine | Admitting: Family Medicine

## 2013-07-07 ENCOUNTER — Encounter (INDEPENDENT_AMBULATORY_CARE_PROVIDER_SITE_OTHER): Payer: Self-pay

## 2013-07-07 ENCOUNTER — Other Ambulatory Visit (HOSPITAL_COMMUNITY): Payer: Self-pay | Admitting: Family Medicine

## 2013-07-07 ENCOUNTER — Ambulatory Visit (HOSPITAL_COMMUNITY)
Admission: RE | Admit: 2013-07-07 | Discharge: 2013-07-07 | Disposition: A | Discharge status: Home / Self Care | Payer: Medicare Other | Source: Ambulatory Visit | Attending: Surgery | Admitting: Surgery

## 2013-07-07 DIAGNOSIS — G459 Transient cerebral ischemic attack, unspecified: Secondary | ICD-10-CM

## 2013-07-07 DIAGNOSIS — H539 Unspecified visual disturbance: Secondary | ICD-10-CM

## 2013-07-08 ENCOUNTER — Other Ambulatory Visit (HOSPITAL_COMMUNITY): Payer: Self-pay | Admitting: Family Medicine

## 2013-07-08 DIAGNOSIS — I639 Cerebral infarction, unspecified: Secondary | ICD-10-CM

## 2013-07-09 ENCOUNTER — Ambulatory Visit
Admission: RE | Admit: 2013-07-09 | Discharge: 2013-07-09 | Disposition: A | Discharge status: Home / Self Care | Payer: Medicare Other | Source: Ambulatory Visit | Attending: Family Medicine | Admitting: Family Medicine

## 2013-07-09 ENCOUNTER — Encounter: Payer: Self-pay | Admitting: Neurology

## 2013-07-09 ENCOUNTER — Ambulatory Visit (INDEPENDENT_AMBULATORY_CARE_PROVIDER_SITE_OTHER): Payer: Medicare Other | Admitting: Neurology

## 2013-07-09 VITALS — BP 131/69 | HR 66 | Temp 97.8°F | Ht 67.0 in | Wt 186.0 lb

## 2013-07-09 DIAGNOSIS — E782 Mixed hyperlipidemia: Secondary | ICD-10-CM

## 2013-07-09 DIAGNOSIS — E1149 Type 2 diabetes mellitus with other diabetic neurological complication: Secondary | ICD-10-CM

## 2013-07-09 DIAGNOSIS — I63219 Cerebral infarction due to unspecified occlusion or stenosis of unspecified vertebral arteries: Secondary | ICD-10-CM | POA: Insufficient documentation

## 2013-07-09 DIAGNOSIS — E7849 Other hyperlipidemia: Secondary | ICD-10-CM

## 2013-07-09 DIAGNOSIS — H5340 Unspecified visual field defects: Secondary | ICD-10-CM | POA: Insufficient documentation

## 2013-07-09 DIAGNOSIS — I6350 Cerebral infarction due to unspecified occlusion or stenosis of unspecified cerebral artery: Secondary | ICD-10-CM

## 2013-07-09 DIAGNOSIS — H539 Unspecified visual disturbance: Secondary | ICD-10-CM

## 2013-07-09 MED ORDER — GADOBENATE DIMEGLUMINE 529 MG/ML IV SOLN
16.0000 mL | Freq: Once | INTRAVENOUS | Status: AC | PRN
  Administered 2013-07-09: 16 mL via INTRAVENOUS

## 2013-07-09 NOTE — Patient Instructions (Signed)
He was advised to discontinue aspirin and stay on Plavix alone for secondary stroke prevention. Maintain strict control of hypertension with blood pressure goal below 130/90 and lipids with LDL cholesterol goal below 100 mg percent. Check a life watch cardiac monitor for paroxysmal atrial fibrillation and transthoracic echo and TEE. He was advised to be careful with his driving. Return for followup in 6 weeks or call earlier if necessary.

## 2013-07-09 NOTE — Progress Notes (Signed)
[facility] [address] [place]. [place] [address] [phone]       OFFICE CONSULT NOTE  [name] Date of Birth:  11/12/1930 Medical Record Number:  [id number]   Referring MD:  Peter Blomgrem  Reason for Referral:   Stroke HPI: 79 year Caucasian male who developed sudden onset of left-sided peripheral vision difficulties on 07/02/13. He noted that he was in black spots on the left side of field of vision and difficulty reading sentences as he could read only parts of the words towards the right and was missing portions on the left. He also developed mild bifrontal headaches which he thought was related to sinuses. The headache was mild and intermittent and did not bother him. Denied any double vision, vertigo, gait, balance difficulties, memory loss focal weakness or numbness. He has no prior history of strokes. He does have vascular factors of hypertension since age 37 as well as coronary artery disease and underwent CABG 7 years ago. He also has hyperlipidemia for which she takes protocol which and lipids were last checked in May and was fine. He had MRI scan of the brain done on 07/07/13 which  I personally reviewed and shows a right occipital as well as right frontal MCA branch acute infarcts. MRA of the brain shows no large vessel intracranial stenosis. MRA of the neck showed no extracranial stenosis either. He has a 2-D echocardiogram planned for tomorrow. He was previously on aspirin and Plavix was added by Dr. Keller. He states that his peripheral vision symptoms improved fairly quickly he still has some difficulty while reading and on the extreme left peripheral visual field.  ROS:   14 system review of systems is positive for blurred vision, difficulty reading only  PMH:  Past Medical History  Diagnosis Date  . Coronary artery disease   . Chest pain   . Hypertension   . Other malaise and fatigue   . Psychosexual dysfunction with inhibited sexual  excitement     Social History:  History   Social History  . Marital Status: Married    Spouse Name: N/A    Number of Children: 4  . Years of Education: college4   Occupational History  . retired    Social History Main Topics  . Smoking status: Former Smoker -- 1.00 packs/day for 35 years    Types: Cigarettes    Quit date: 08/29/1977  . Smokeless tobacco: Never Used  . Alcohol Use: No  . Drug Use: No  . Sexual Activity: Yes   Other Topics Concern  . Not on file   Social History Narrative  . No narrative on file    Medications:   Current Outpatient Prescriptions on File Prior to Visit  Medication Sig Dispense Refill  . amLODipine (NORVASC) 5 MG tablet Take 5 mg by mouth daily.        . aspirin 325 MG tablet Take 325 mg by mouth daily.        . losartan (COZAAR) 100 MG tablet Take 100 mg by mouth daily.      . Multiple Vitamin (MULTIVITAMIN) capsule Take 1 capsule by mouth daily.        . niacin (NIASPAN) 500 MG CR tablet Take 500 mg by mouth daily.        . omeprazole (PRILOSEC) 20 MG capsule Take 20 mg by mouth daily.        . polyethylene glycol (MIRALAX / GLYCOLAX) packet Take 17 g by mouth daily.      .   pravastatin (PRAVACHOL) 80 MG tablet Take 80 mg by mouth daily.        . saw palmetto 160 MG capsule Take 160 mg by mouth 2 (two) times daily.        . tadalafil (CIALIS) 5 MG tablet Take 5 mg by mouth daily.         No current facility-administered medications on file prior to visit.    Allergies:   Allergies  Allergen Reactions  . Iohexol Anaphylaxis     THROAT SWELLS AND STOPS BREATHING   . Codeine Other (See Comments)      Reaction not known  . Sulfonamide Derivatives Other (See Comments)      Reaction not known    Physical Exam General: well developed, well nourished, seated, in no evident distress Head: head normocephalic and atraumatic. Orohparynx benign Neck: supple with no carotid or supraclavicular bruits Cardiovascular: regular rate and  rhythm, no murmurs Musculoskeletal: no deformity Skin:  no rash/petichiae Vascular:  Normal pulses all extremities Filed Vitals:   07/09/13 1256  BP: 131/69  Pulse: 66  Temp: 97.8 F (36.6 C)    Neurologic Exam Mental Status: Awake and fully alert. Oriented to place and time. Recent and remote memory intact. Attention span, concentration and fund of knowledge appropriate. Mood and affect appropriate.  Cranial Nerves: Fundoscopic exam reveals sharp disc margins. Pupils equal, briskly reactive to light. Extraocular movements full without nystagmus. Visual fields almostfull to confrontation with only mild extreme left peripheral temporal field defect to bedside confrontational testing. Hearing intact. Facial sensation intact. Face, tongue, palate moves normally and symmetrically.  Motor: Normal bulk and tone. Normal strength in all tested extremity muscles. Sensory.: intact to touch and pinprick and vibratory.  Coordination: Rapid alternating movements normal in all extremities. Finger-to-nose and heel-to-shin performed accurately bilaterally. Gait and Station: Arises from chair without difficulty. Stance is normal. Gait demonstrates normal stride length and balance . Able to heel, toe and tandem walk without difficulty.  Reflexes: 1+ and symmetric. Toes downgoing.   NIHSS  1 Modified Rankin  1  ASSESSMENT: 82 year caucasian male with transient left-sided vision difficulties do to embolic right right PCA and MCA branch infarcts.    PLAN: I had a long discussion the patient with regards to his symptoms, discuss the results of available tests, risk factor modification, stroke prevention and answered questions.He was advised to discontinue aspirin and stay on Plavix alone for secondary stroke prevention. Maintain strict control of hypertension with blood pressure goal below 130/90 and lipids with LDL cholesterol goal below 100 mg percent. Check a life watch cardiac monitor for paroxysmal  atrial fibrillation and transthoracic echo and TEE. He was advised to be careful with his driving. Return for followup in 6 weeks or call earlier if necessary.     

## 2013-07-10 ENCOUNTER — Ambulatory Visit (HOSPITAL_COMMUNITY)
Admission: RE | Admit: 2013-07-10 | Discharge: 2013-07-10 | Disposition: A | Discharge status: Home / Self Care | Payer: Medicare Other | Source: Ambulatory Visit | Attending: Family Medicine | Admitting: Family Medicine

## 2013-07-10 ENCOUNTER — Other Ambulatory Visit (INDEPENDENT_AMBULATORY_CARE_PROVIDER_SITE_OTHER): Payer: Self-pay

## 2013-07-10 DIAGNOSIS — I369 Nonrheumatic tricuspid valve disorder, unspecified: Secondary | ICD-10-CM

## 2013-07-10 DIAGNOSIS — Z0289 Encounter for other administrative examinations: Secondary | ICD-10-CM

## 2013-07-10 DIAGNOSIS — I2510 Atherosclerotic heart disease of native coronary artery without angina pectoris: Secondary | ICD-10-CM | POA: Insufficient documentation

## 2013-07-10 DIAGNOSIS — Z87891 Personal history of nicotine dependence: Secondary | ICD-10-CM | POA: Insufficient documentation

## 2013-07-10 DIAGNOSIS — I079 Rheumatic tricuspid valve disease, unspecified: Secondary | ICD-10-CM | POA: Insufficient documentation

## 2013-07-10 DIAGNOSIS — R079 Chest pain, unspecified: Secondary | ICD-10-CM | POA: Insufficient documentation

## 2013-07-10 DIAGNOSIS — E785 Hyperlipidemia, unspecified: Secondary | ICD-10-CM | POA: Insufficient documentation

## 2013-07-10 DIAGNOSIS — I639 Cerebral infarction, unspecified: Secondary | ICD-10-CM

## 2013-07-10 DIAGNOSIS — I10 Essential (primary) hypertension: Secondary | ICD-10-CM | POA: Insufficient documentation

## 2013-07-10 NOTE — Progress Notes (Signed)
*  PRELIMINARY RESULTS* Echocardiogram 2D Echocardiogram has been performed.  Jeryl Columbia 07/10/2013, 3:37 PM

## 2013-07-11 ENCOUNTER — Telehealth: Payer: Self-pay

## 2013-07-11 LAB — HEMOGLOBIN A1C
Est. average glucose Bld gHb Est-mCnc: 131 mg/dL
Hgb A1c MFr Bld: 6.2 % — ABNORMAL HIGH (ref 4.8–5.6)

## 2013-07-11 LAB — LIPID PANEL: VLDL Cholesterol Cal: 23 mg/dL (ref 5–40)

## 2013-07-11 NOTE — Telephone Encounter (Signed)
Received call from Debbie at Dr.Sethi's office requesting a TEE to be scheduled for patient.TEE scheduled Thursday 07/17/13 at 2:00 PM arrive at 12:20 pm at [facility].Patient was notified and given time and instructions.Patient verbalized understanding.Copy of instructions mailed to patient.

## 2013-07-14 ENCOUNTER — Other Ambulatory Visit: Payer: Self-pay | Admitting: *Deleted

## 2013-07-14 ENCOUNTER — Other Ambulatory Visit (HOSPITAL_COMMUNITY): Payer: Medicare Other

## 2013-07-14 DIAGNOSIS — I4891 Unspecified atrial fibrillation: Secondary | ICD-10-CM

## 2013-07-14 NOTE — Addendum Note (Signed)
Addended byHermenia Fiscal on: 07/14/2013 12:12 PM   Modules accepted: Orders

## 2013-07-14 NOTE — Progress Notes (Signed)
Need another order with different diagnosis.

## 2013-07-17 ENCOUNTER — Encounter (HOSPITAL_COMMUNITY): Payer: Self-pay | Admitting: Gastroenterology

## 2013-07-17 ENCOUNTER — Encounter (HOSPITAL_COMMUNITY)
Admission: RE | Disposition: A | Discharge status: Home / Self Care | Payer: Self-pay | Source: Ambulatory Visit | Attending: Cardiovascular Disease

## 2013-07-17 ENCOUNTER — Ambulatory Visit (HOSPITAL_COMMUNITY)
Admission: RE | Admit: 2013-07-17 | Discharge: 2013-07-17 | Disposition: A | Discharge status: Home / Self Care | Payer: Medicare Other | Source: Ambulatory Visit | Attending: Cardiovascular Disease | Admitting: Cardiovascular Disease

## 2013-07-17 ENCOUNTER — Other Ambulatory Visit (HOSPITAL_COMMUNITY): Payer: Medicare Other

## 2013-07-17 DIAGNOSIS — Z7982 Long term (current) use of aspirin: Secondary | ICD-10-CM | POA: Insufficient documentation

## 2013-07-17 DIAGNOSIS — I10 Essential (primary) hypertension: Secondary | ICD-10-CM | POA: Insufficient documentation

## 2013-07-17 DIAGNOSIS — I63219 Cerebral infarction due to unspecified occlusion or stenosis of unspecified vertebral arteries: Secondary | ICD-10-CM

## 2013-07-17 DIAGNOSIS — I2510 Atherosclerotic heart disease of native coronary artery without angina pectoris: Secondary | ICD-10-CM | POA: Insufficient documentation

## 2013-07-17 DIAGNOSIS — Z951 Presence of aortocoronary bypass graft: Secondary | ICD-10-CM | POA: Insufficient documentation

## 2013-07-17 DIAGNOSIS — I253 Aneurysm of heart: Secondary | ICD-10-CM | POA: Insufficient documentation

## 2013-07-17 DIAGNOSIS — I6789 Other cerebrovascular disease: Secondary | ICD-10-CM

## 2013-07-17 DIAGNOSIS — Z8673 Personal history of transient ischemic attack (TIA), and cerebral infarction without residual deficits: Secondary | ICD-10-CM | POA: Insufficient documentation

## 2013-07-17 HISTORY — PX: TEE WITHOUT CARDIOVERSION: SHX5443

## 2013-07-17 SURGERY — ECHOCARDIOGRAM, TRANSESOPHAGEAL
Anesthesia: Moderate Sedation

## 2013-07-17 MED ORDER — MIDAZOLAM HCL 5 MG/ML IJ SOLN
INTRAMUSCULAR | Status: AC
  Filled 2013-07-17: qty 2

## 2013-07-17 MED ORDER — BUTAMBEN-TETRACAINE-BENZOCAINE 2-2-14 % EX AERO
INHALATION_SPRAY | CUTANEOUS | Status: DC | PRN
  Administered 2013-07-17: 2 via TOPICAL

## 2013-07-17 MED ORDER — SODIUM CHLORIDE 0.9 % IV SOLN
INTRAVENOUS | Status: DC
  Administered 2013-07-17: 13:00:00 via INTRAVENOUS

## 2013-07-17 MED ORDER — MIDAZOLAM HCL 10 MG/2ML IJ SOLN
INTRAMUSCULAR | Status: DC | PRN
  Administered 2013-07-17: 1 mg via INTRAVENOUS
  Administered 2013-07-17: 2 mg via INTRAVENOUS
  Administered 2013-07-17: 1 mg via INTRAVENOUS
  Administered 2013-07-17: 2 mg via INTRAVENOUS

## 2013-07-17 MED ORDER — FENTANYL CITRATE 0.05 MG/ML IJ SOLN
INTRAMUSCULAR | Status: DC | PRN
  Administered 2013-07-17 (×2): 25 ug via INTRAVENOUS

## 2013-07-17 MED ORDER — FENTANYL CITRATE 0.05 MG/ML IJ SOLN
INTRAMUSCULAR | Status: AC
  Filled 2013-07-17: qty 2

## 2013-07-17 NOTE — H&P (View-Only) (Signed)
[facility] [address] [place]. [place] [address] [phone]       OFFICE CONSULT NOTE  [name] Date of Birth:  07-Feb-1931 Medical Record Number:  [id number]   Referring MD:  Katy Fitch  Reason for Referral:   Stroke HPI: 19 year Caucasian male who developed sudden onset of left-sided peripheral vision difficulties on 07/02/13. He noted that he was in black spots on the left side of field of vision and difficulty reading sentences as he could read only parts of the words towards the right and was missing portions on the left. He also developed mild bifrontal headaches which he thought was related to sinuses. The headache was mild and intermittent and did not bother him. Denied any double vision, vertigo, gait, balance difficulties, memory loss focal weakness or numbness. He has no prior history of strokes. He does have vascular factors of hypertension since age 18 as well as coronary artery disease and underwent CABG 7 years ago. He also has hyperlipidemia for which she takes protocol which and lipids were last checked in May and was fine. He had MRI scan of the brain done on 07/07/13 which  I personally reviewed and shows a right occipital as well as right frontal MCA branch acute infarcts. MRA of the brain shows no large vessel intracranial stenosis. MRA of the neck showed no extracranial stenosis either. He has a 2-D echocardiogram planned for tomorrow. He was previously on aspirin and Plavix was added by Dr. Lorenz Coaster. He states that his peripheral vision symptoms improved fairly quickly he still has some difficulty while reading and on the extreme left peripheral visual field.  ROS:   14 system review of systems is positive for blurred vision, difficulty reading only  PMH:  Past Medical History  Diagnosis Date  . Coronary artery disease   . Chest pain   . Hypertension   . Other malaise and fatigue   . Psychosexual dysfunction with inhibited sexual  excitement     Social History:  History   Social History  . Marital Status: Married    Spouse Name: N/A    Number of Children: 4  . Years of Education: college4   Occupational History  . retired    Social History Main Topics  . Smoking status: Former Smoker -- 1.00 packs/day for 35 years    Types: Cigarettes    Quit date: 08/29/1977  . Smokeless tobacco: Never Used  . Alcohol Use: No  . Drug Use: No  . Sexual Activity: Yes   Other Topics Concern  . Not on file   Social History Narrative  . No narrative on file    Medications:   Current Outpatient Prescriptions on File Prior to Visit  Medication Sig Dispense Refill  . amLODipine (NORVASC) 5 MG tablet Take 5 mg by mouth daily.        Marland Kitchen aspirin 325 MG tablet Take 325 mg by mouth daily.        Marland Kitchen losartan (COZAAR) 100 MG tablet Take 100 mg by mouth daily.      . Multiple Vitamin (MULTIVITAMIN) capsule Take 1 capsule by mouth daily.        . niacin (NIASPAN) 500 MG CR tablet Take 500 mg by mouth daily.        Marland Kitchen omeprazole (PRILOSEC) 20 MG capsule Take 20 mg by mouth daily.        . polyethylene glycol (MIRALAX / GLYCOLAX) packet Take 17 g by mouth daily.      Marland Kitchen  pravastatin (PRAVACHOL) 80 MG tablet Take 80 mg by mouth daily.        . saw palmetto 160 MG capsule Take 160 mg by mouth 2 (two) times daily.        . tadalafil (CIALIS) 5 MG tablet Take 5 mg by mouth daily.         No current facility-administered medications on file prior to visit.    Allergies:   Allergies  Allergen Reactions  . Iohexol Anaphylaxis     THROAT SWELLS AND STOPS BREATHING   . Codeine Other (See Comments)      Reaction not known  . Sulfonamide Derivatives Other (See Comments)      Reaction not known    Physical Exam General: well developed, well nourished, seated, in no evident distress Head: head normocephalic and atraumatic. Orohparynx benign Neck: supple with no carotid or supraclavicular bruits Cardiovascular: regular rate and  rhythm, no murmurs Musculoskeletal: no deformity Skin:  no rash/petichiae Vascular:  Normal pulses all extremities Filed Vitals:   07/09/13 1256  BP: 131/69  Pulse: 66  Temp: 97.8 F (36.6 C)    Neurologic Exam Mental Status: Awake and fully alert. Oriented to place and time. Recent and remote memory intact. Attention span, concentration and fund of knowledge appropriate. Mood and affect appropriate.  Cranial Nerves: Fundoscopic exam reveals sharp disc margins. Pupils equal, briskly reactive to light. Extraocular movements full without nystagmus. Visual fields almostfull to confrontation with only mild extreme left peripheral temporal field defect to bedside confrontational testing. Hearing intact. Facial sensation intact. Face, tongue, palate moves normally and symmetrically.  Motor: Normal bulk and tone. Normal strength in all tested extremity muscles. Sensory.: intact to touch and pinprick and vibratory.  Coordination: Rapid alternating movements normal in all extremities. Finger-to-nose and heel-to-shin performed accurately bilaterally. Gait and Station: Arises from chair without difficulty. Stance is normal. Gait demonstrates normal stride length and balance . Able to heel, toe and tandem walk without difficulty.  Reflexes: 1+ and symmetric. Toes downgoing.   NIHSS  1 Modified Rankin  1  ASSESSMENT: 74 year caucasian male with transient left-sided vision difficulties do to embolic right right PCA and MCA branch infarcts.    PLAN: I had a long discussion the patient with regards to his symptoms, discuss the results of available tests, risk factor modification, stroke prevention and answered questions.He was advised to discontinue aspirin and stay on Plavix alone for secondary stroke prevention. Maintain strict control of hypertension with blood pressure goal below 130/90 and lipids with LDL cholesterol goal below 100 mg percent. Check a life watch cardiac monitor for paroxysmal  atrial fibrillation and transthoracic echo and TEE. He was advised to be careful with his driving. Return for followup in 6 weeks or call earlier if necessary.

## 2013-07-17 NOTE — Interval H&P Note (Signed)
History and Physical Interval Note:  07/17/2013 12:52 PM  Jose [name]  has presented today for surgery, with the diagnosis of CERBRAL ARTER OCCULISION   The various methods of treatment have been discussed with the patient and family. After consideration of risks, benefits and other options for treatment, the patient has consented to  Procedure(s): TRANSESOPHAGEAL ECHOCARDIOGRAM (TEE) (N/A) as a surgical intervention .  The patient's history has been reviewed, patient examined, no change in status, stable for surgery.  I have reviewed the patient's chart and labs.  Questions were answered to the patient's satisfaction.     Charlton Haws

## 2013-07-17 NOTE — Progress Notes (Signed)
Echocardiogram Echocardiogram Transesophageal has been performed.  Jose [name] 07/17/2013, 2:47 PM

## 2013-07-17 NOTE — CV Procedure (Signed)
TEE:  Indication CVA  Normal LV size but not well seen due to hiatal hernia Mild MR Normal AV Mild TR No LAA thrombus Atrial septal aneurysm with no PFO / ASD and negative bubble study Normal aorta with no debris Normal RV No effusion  Tolerated Procedure well  Charlton Haws

## 2013-07-18 ENCOUNTER — Encounter (HOSPITAL_COMMUNITY): Payer: Self-pay | Admitting: Cardiovascular Disease

## 2013-07-23 ENCOUNTER — Encounter: Payer: Self-pay | Admitting: *Deleted

## 2013-07-23 ENCOUNTER — Encounter (INDEPENDENT_AMBULATORY_CARE_PROVIDER_SITE_OTHER): Payer: Medicare Other

## 2013-07-23 DIAGNOSIS — I4891 Unspecified atrial fibrillation: Secondary | ICD-10-CM

## 2013-07-23 NOTE — Progress Notes (Signed)
Patient ID: Jose [name], male   DOB: 01-10-1931, 79 y.o.   MRN: [id number] E-Cardio verite 30 day cardiac event monitor applied to patient.

## 2013-08-11 ENCOUNTER — Telehealth: Payer: Self-pay | Admitting: *Deleted

## 2013-09-02 ENCOUNTER — Telehealth: Payer: Self-pay | Admitting: Neurology

## 2013-09-02 NOTE — Telephone Encounter (Signed)
Lauren from Dr. Theron AristaPeter Blomgren's office calling to get results from the cardiac monitor they believe Dr. Pearlean BrownieSethi prescribed for 30 days.  Please call [phone] with results.

## 2013-09-02 NOTE — Telephone Encounter (Signed)
Patient calling about results of his heart monitor that he wore for a month. Please call him back.

## 2013-09-03 NOTE — Telephone Encounter (Signed)
I called and spoke to [facility] and sent message to Scherrie Batemanhristine York, RN asking for her to assist in getting results of cardiac event monitor.  I spoke to Lauren to let her know.

## 2013-09-04 ENCOUNTER — Other Ambulatory Visit: Payer: Self-pay | Admitting: Neurology

## 2013-09-04 ENCOUNTER — Telehealth: Payer: Self-pay | Admitting: *Deleted

## 2013-09-04 DIAGNOSIS — I4891 Unspecified atrial fibrillation: Secondary | ICD-10-CM

## 2013-09-04 NOTE — Telephone Encounter (Signed)
I will ask my staff to schedule an appointment.  Thank you

## 2013-09-04 NOTE — Telephone Encounter (Signed)
Needs referral to Dr Johney Frameallred for afib management and change plavix to Eliquis 2.5 mg twice daily

## 2013-09-04 NOTE — Telephone Encounter (Signed)
Wynona CanesChristine called me back and gave me end of summary report per Dr. Johney FrameAllred.   Faxed copy to me.  Forwarded to Dr. Pearlean BrownieSethi to review.

## 2013-09-04 NOTE — Telephone Encounter (Signed)
Message copied by Alois ClicheYORK, [address]ta[address] on Thu Sep 04, 2013 10:29 AM ------      Message from: [place], [place]      Created: Wed Sep 03, 2013  3:13 PM      Regarding: cardiac event monitor results.       Hey.  Im calling re: the results of the event monitor.  Pcp has called us along with pt looking for results.  Receptionist let me know that Dr. Eden EmmsNishan was to read.  Could you check on this for us?  Thanks             Alverda SkeansSandy Young, RN/ Dr. Pearlean BrownieSethi ------

## 2013-09-04 NOTE — Telephone Encounter (Signed)
MONITOR LOCATED  AND   FAXED END OF  REPORT  TO SANDY  AT  DR  SETHI'S OFFICE  PER  SANDY'S REQUEST .Zack Seal/CY

## 2013-09-05 ENCOUNTER — Telehealth: Payer: Self-pay | Admitting: Cardiovascular Disease

## 2013-09-05 NOTE — Telephone Encounter (Signed)
New message     Pt want to talk to someone about the results of his monitor

## 2013-09-05 NOTE — Telephone Encounter (Signed)
I called pt and confirmed with him that he had gotten results from Dr. Pearlean BrownieSethi, and medication recommendations will be with cardiologist.  Our referral to cardiologist, has Dr. Johney FrameAllred, although per Dr. Pearlean BrownieSethi, could be another per pts request.

## 2013-09-05 NOTE — Telephone Encounter (Signed)
Called patient and informed that referral will be sent to Dr Johney FrameAllred, he verbalized understanding,wants know if prescription for eliquis 2.5 mg was called into his pharmacy already

## 2013-09-05 NOTE — Telephone Encounter (Signed)
Patient returning Dr. Marlis EdelsonSethi's call, please call the patient back.

## 2013-09-05 NOTE — Telephone Encounter (Signed)
PER PT  DISCUSSED MONITOR WITH  DR  SETHI  PT  HAS  APPT  NEXT  WED  WITH DR  ALLRED./CY

## 2013-09-10 ENCOUNTER — Ambulatory Visit (INDEPENDENT_AMBULATORY_CARE_PROVIDER_SITE_OTHER): Payer: Medicare Other | Admitting: Internal Medicine

## 2013-09-10 ENCOUNTER — Encounter: Payer: Self-pay | Admitting: Internal Medicine

## 2013-09-10 VITALS — BP 112/70 | HR 59 | Ht 69.0 in | Wt 182.0 lb

## 2013-09-10 DIAGNOSIS — I4891 Unspecified atrial fibrillation: Secondary | ICD-10-CM | POA: Insufficient documentation

## 2013-09-10 DIAGNOSIS — I10 Essential (primary) hypertension: Secondary | ICD-10-CM

## 2013-09-10 DIAGNOSIS — I2510 Atherosclerotic heart disease of native coronary artery without angina pectoris: Secondary | ICD-10-CM

## 2013-09-10 DIAGNOSIS — I63219 Cerebral infarction due to unspecified occlusion or stenosis of unspecified vertebral arteries: Secondary | ICD-10-CM

## 2013-09-10 MED ORDER — RIVAROXABAN 20 MG PO TABS: 20.0000 mg | ORAL_TABLET | Freq: Every day | ORAL | Status: DC

## 2013-09-10 NOTE — Progress Notes (Signed)
Primary Care Physician: Carolyne FiscalBLOMGREN,PETER F, MD Referring Physician:  Dr Jens Somrenshaw (scheduled to see), previously Dr Tamera PuntWall   Jose [name] is a 80 y.o. male with a h/o CAD who recently presented with embolic R PCA and MCA branch infarcts.  He was evaluated by neurology and had a 30 day event monitor placed as part of his workup.  This monitor revealed atrial fibrillation (rate controlled).  He is therefore referred by Dr Pearlean BrownieSethi to me for afib management.  Dr Pearlean BrownieSethi called in eliquis, but it does not appear that the patient has initiated this treatment yet. The patient has been asymptomatic with his afib.  Today, he denies symptoms of palpitations, chest pain, shortness of breath, orthopnea, PND, lower extremity edema, dizziness, presyncope, syncope, or new neurologic sequela. The patient is tolerating medications without difficulties and is otherwise without complaint today.   Past Medical History  Diagnosis Date  . Coronary artery disease   . Chest pain   . Hypertension   . Other malaise and fatigue   . Psychosexual dysfunction with inhibited sexual excitement   . Stroke   . Paroxysmal atrial fibrillation    Past Surgical History  Procedure Laterality Date  . Coronary artery bypass graft    . Appendectomy    . Cholecystectomy    . Inguinal hernia repair    . Urteral obstruction      multiple repair transurethal procedures for  . Tee without cardioversion N/A 07/17/2013    Procedure: TRANSESOPHAGEAL ECHOCARDIOGRAM (TEE);  Surgeon: Wendall StadePeter C Nishan, MD;  [facility];  Service: Cardiovascular;  Laterality: N/A;    Current Outpatient Prescriptions  Medication Sig Dispense Refill  . amLODipine (NORVASC) 5 MG tablet Take 5 mg by mouth daily.        Marland Kitchen. losartan (COZAAR) 100 MG tablet Take 100 mg by mouth daily.      . Multiple Vitamin (MULTIVITAMIN) capsule Take 1 capsule by mouth daily.        . niacin (NIASPAN) 500 MG CR tablet Take 500 mg by mouth daily.        Marland Kitchen. omeprazole  (PRILOSEC) 20 MG capsule Take 20 mg by mouth daily.        . polyethylene glycol (MIRALAX / GLYCOLAX) packet Take 17 g by mouth daily.      . pravastatin (PRAVACHOL) 80 MG tablet Take 80 mg by mouth daily.        . saw palmetto 160 MG capsule Take 160 mg by mouth 2 (two) times daily.        . tadalafil (CIALIS) 5 MG tablet Take 5 mg by mouth daily.        . Rivaroxaban (XARELTO) 20 MG TABS tablet Take 1 tablet (20 mg total) by mouth daily with supper.  30 tablet  11   No current facility-administered medications for this visit.    Allergies  Allergen Reactions  . Iohexol Anaphylaxis     THROAT SWELLS AND STOPS BREATHING   . Codeine Other (See Comments)      Reaction not known  . Sulfonamide Derivatives Other (See Comments)      Reaction not known    History   Social History  . Marital Status: Married    Spouse Name: N/A    Number of Children: 4  . Years of Education: college4   Occupational History  . retired    Social History Main Topics  . Smoking status: Former Smoker -- 1.00 packs/day for 35 years  Types: Cigarettes    Quit date: 08/29/1977  . Smokeless tobacco: Never Used  . Alcohol Use: No  . Drug Use: No  . Sexual Activity: Yes   Other Topics Concern  . Not on file   Social History Narrative  . No narrative on file    Family History  Problem Relation Age of Onset  . Heart disease Mother     ROS- All systems are reviewed and negative except as per the HPI above  Physical Exam: Filed Vitals:   09/10/13 1557  BP: 112/70  Pulse: 59  Height: 5\' 9"  (1.753 m)  Weight: 182 lb (82.555 kg)    GEN- The patient is elderly but pleasant appearing, alert and oriented x 3 today.   Head- normocephalic, atraumatic Eyes-  Sclera clear, conjunctiva pink Ears- hearing intact Oropharynx- clear Neck- supple, no JVP Lymph- no cervical lymphadenopathy Lungs- Clear to ausculation bilaterally, normal work of breathing Heart- Regular rate and rhythm, no murmurs,  rubs or gallops, PMI not laterally displaced GI- soft, NT, ND, + BS Extremities- no clubbing, cyanosis, or edema MS- no significant deformity or atrophy Skin- no rash or lesion Psych- euthymic mood, full affect Neuro- strength and sensation are intact  EKG today reveals sinus rhythm 59 bpm, nonspecific ST/T changes Event monitor is reviewed at length and rate controlled afib is confirmed  Assessment and Plan:  1. Newly diagnosed atrial fibrillation The patient is asymptomatic and rate controlled.  I have reviewed his event monitor in detail today.  His CHADS2VASC score is at least 6.  He should therefore be anticoagulated long term.  He was prescribed Eliquis by Dr Pearlean Brownie but does not appear to have begun this therapy. Today, I discussed coumadin novel anticoagulants including pradaxa, xarelto, and eliquis today as indicated for risk reduction in stroke and systemic emboli with nonvalvular atrial fibrillation.  Risks, benefits, and alternatives to each of these drugs were discussed at length today.  His primary concern is with cost.  I would anticipate that xarelto may be a little cheaper than eliquis for him.  He would therefore prefer xarelto. I will stop plavix and initiate xarelto.  CBC and BMET are ordered today upon initiation of therapy.  He will follow-up in our anticoagulation clinic for anticoagulation risk reduction long term.    2. CVA As above He has scheduled follow-up with Dr Pearlean Brownie  3. CAD Stable No change required today  4. HTN Stable No change required today  He will follow-up with Dr Jens Som for routine cardiology management as planned. I will see as needed going forward

## 2013-09-10 NOTE — Patient Instructions (Addendum)
Your physician recommends that you schedule a follow-up appointment in: 6 weeks with Weston BrassSally Putt, Pharm D for Xarelto    Your physician wants you to follow-up in: 6 months with Dr Shelda Palrenshaw You will receive a reminder letter in the mail two months in advance. If you don't receive a letter, please call our office to schedule the follow-up appointment.  Your physician has recommended you make the following change in your medication:  1) Stop Plavix 2) Start Xarelto 20mg  daily   Your physician recommends that you return for lab work today

## 2013-09-11 LAB — CBC WITH DIFFERENTIAL/PLATELET
Basophils Absolute: 0 10*3/uL (ref 0.0–0.1)
Basophils Relative: 0.5 % (ref 0.0–3.0)
Eosinophils Absolute: 0.2 10*3/uL (ref 0.0–0.7)
Eosinophils Relative: 4.1 % (ref 0.0–5.0)
HCT: 44.4 % (ref 39.0–52.0)
Hemoglobin: 15.3 g/dL (ref 13.0–17.0)
Lymphocytes Relative: 28.4 % (ref 12.0–46.0)
Lymphs Abs: 1.6 10*3/uL (ref 0.7–4.0)
MCHC: 34.4 g/dL (ref 30.0–36.0)
MCV: 91.6 fl (ref 78.0–100.0)
MONO ABS: 0.6 10*3/uL (ref 0.1–1.0)
Monocytes Relative: 11.3 % (ref 3.0–12.0)
NEUTROS PCT: 55.7 % (ref 43.0–77.0)
Neutro Abs: 3.1 10*3/uL (ref 1.4–7.7)
PLATELETS: 137 10*3/uL — AB (ref 150.0–400.0)
RBC: 4.85 Mil/uL (ref 4.22–5.81)
RDW: 13.2 % (ref 11.5–14.6)
WBC: 5.6 10*3/uL (ref 4.5–10.5)

## 2013-09-11 LAB — BASIC METABOLIC PANEL
BUN: 18 mg/dL (ref 6–23)
CALCIUM: 9.5 mg/dL (ref 8.4–10.5)
CO2: 28 mEq/L (ref 19–32)
CREATININE: 1.2 mg/dL (ref 0.4–1.5)
Chloride: 105 mEq/L (ref 96–112)
GFR: 61.86 mL/min (ref >60.00)
Glucose, Bld: 98 mg/dL (ref 70–99)
Potassium: 4.8 mEq/L (ref 3.5–5.1)
Sodium: 138 mEq/L (ref 135–145)

## 2013-09-17 ENCOUNTER — Ambulatory Visit (INDEPENDENT_AMBULATORY_CARE_PROVIDER_SITE_OTHER): Payer: Medicare Other | Admitting: Neurology

## 2013-09-17 ENCOUNTER — Encounter: Payer: Self-pay | Admitting: Neurology

## 2013-09-17 VITALS — BP 129/62 | HR 59 | Ht 69.0 in | Wt 181.0 lb

## 2013-09-17 DIAGNOSIS — I4891 Unspecified atrial fibrillation: Secondary | ICD-10-CM

## 2013-09-17 NOTE — Patient Instructions (Signed)
I had a long discussion the patient with regards to atrial fibrillation, risk of stroke and risk reduction with anticoagulation with Xarelto. I recommend he continue Xarelto and followup with Dr. Johney FrameAllred. I advised him to follow up with his primary physician Dr Duaine DredgeBlomgren for right groin mass evaluation. Follow up in future with Heide GuileLynn Lam, nurse practitioner in 3 months

## 2013-09-17 NOTE — Progress Notes (Signed)
[facility] [address] [place]. [place] [address] [phone]       OFFICE FOLLOW UP VISIT NOTE  [name] Date of Birth:  05-Jun-1931 Medical Record Number:  [id number]   Referring MD:  Katy Fitch  Reason for Referral:   Stroke HPI:  Update 09/17/13 : He returns for followup after last visit 2 months ago. He was found to have paroxysmal atrial fibrillation on life watch monitor with controlled heart rate, CHADs2VASC score was 6 and I prescribed Eliquis but has seen Dr. Johney Frame cardiologist on 09/10/13 who has started him on  Xarelto instead do to cost reasons. He seems to be tolerating it well without significant bleeding and only minor bruising. He had lab work done on 07/10/13 which showed normal lipid profile and hemoglobin A1c was borderline at 6.2. He continues to have mild residual left-sided peripheral vision difficulties but has been able to drive okay and has not had any accidents or near misses. He has noticed for the last several years that he has a little bit of fullness and swelling in his right groin and has not yet sought any medical evaluation for this. He denies significant pain but does notice some fullness in the groin. Initial Consult 07/09/2013 ;60 year Caucasian male who developed sudden onset of left-sided peripheral vision difficulties on 07/02/13. He noted that he was in black spots on the left side of field of vision and difficulty reading sentences as he could read only parts of the words towards the right and was missing portions on the left. He also developed mild bifrontal headaches which he thought was related to sinuses. The headache was mild and intermittent and did not bother him. Denied any double vision, vertigo, gait, balance difficulties, memory loss focal weakness or numbness. He has no prior history of strokes. He does have vascular factors of hypertension since age 80 as well as coronary artery disease and underwent CABG 7 years  ago. He also has hyperlipidemia for which she takes protocol which and lipids were last checked in May and was fine. He had MRI scan of the brain done on 07/07/13 which  I personally reviewed and shows a right occipital as well as right frontal MCA branch acute infarcts. MRA of the brain shows no large vessel intracranial stenosis. MRA of the neck showed no extracranial stenosis either. He has a 2-D echocardiogram planned for tomorrow. He was previously on aspirin and Plavix was added by Dr. Lorenz Coaster. He states that his peripheral vision symptoms improved fairly quickly he still has some difficulty while reading and on the extreme left peripheral visual field.  ROS:   14 system review of systems is positive for blurred vision, difficulty reading only  PMH:  Past Medical History  Diagnosis Date  . Coronary artery disease   . Chest pain   . Hypertension   . Other malaise and fatigue   . Psychosexual dysfunction with inhibited sexual excitement   . Stroke   . Paroxysmal atrial fibrillation     Social History:  History   Social History  . Marital Status: Married    Spouse Name: maxine    Number of Children: 4  . Years of Education: college4   Occupational History  . retired    Social History Main Topics  . Smoking status: Former Smoker -- 1.00 packs/day for 35 years    Types: Cigarettes    Quit date: 08/29/1977  . Smokeless tobacco: Never Used  . Alcohol Use:  No  . Drug Use: No  . Sexual Activity: Yes   Other Topics Concern  . Not on file   Social History Narrative  . No narrative on file    Medications:   Current Outpatient Prescriptions on File Prior to Visit  Medication Sig Dispense Refill  . amLODipine (NORVASC) 5 MG tablet Take 5 mg by mouth daily.        Marland Kitchen losartan (COZAAR) 100 MG tablet Take 100 mg by mouth daily.      . Multiple Vitamin (MULTIVITAMIN) capsule Take 1 capsule by mouth daily.        . niacin (NIASPAN) 500 MG CR tablet Take 500 mg by mouth daily.         Marland Kitchen omeprazole (PRILOSEC) 20 MG capsule Take 20 mg by mouth daily.        . polyethylene glycol (MIRALAX / GLYCOLAX) packet Take 17 g by mouth daily.      . pravastatin (PRAVACHOL) 80 MG tablet Take 80 mg by mouth daily.        . Rivaroxaban (XARELTO) 20 MG TABS tablet Take 1 tablet (20 mg total) by mouth daily with supper.  30 tablet  11  . saw palmetto 160 MG capsule Take 160 mg by mouth 2 (two) times daily.        . tadalafil (CIALIS) 5 MG tablet Take 5 mg by mouth daily.         No current facility-administered medications on file prior to visit.    Allergies:   Allergies  Allergen Reactions  . Iohexol Anaphylaxis     THROAT SWELLS AND STOPS BREATHING   . Codeine Other (See Comments)      Reaction not known  . Sulfonamide Derivatives Other (See Comments)      Reaction not known    Physical Exam General: well developed, well nourished, seated, in no evident distress Head: head normocephalic and atraumatic. Orohparynx benign Neck: supple with no carotid or supraclavicular bruits Cardiovascular: regular rate and rhythm, no murmurs Musculoskeletal: no deformity Skin:  no rash/petichiae Vascular:  Normal pulses all extremities Filed Vitals:   09/17/13 1416  BP: 129/62  Pulse: 59    Neurologic Exam Mental Status: Awake and fully alert. Oriented to place and time. Recent and remote memory intact. Attention span, concentration and fund of knowledge appropriate. Mood and affect appropriate.  Cranial Nerves: Fundoscopic exam reveals sharp disc margins. Pupils equal, briskly reactive to light. Extraocular movements full without nystagmus. Visual fields almostfull to confrontation with only mild extreme left peripheral temporal field defect to bedside confrontational testing. Hearing intact. Facial sensation intact. Face, tongue, palate moves normally and symmetrically.  Motor: Normal bulk and tone. Normal strength in all tested extremity muscles. Sensory.: intact to touch and  pinprick and vibratory.  Coordination: Rapid alternating movements normal in all extremities. Finger-to-nose and heel-to-shin performed accurately bilaterally. Gait and Station: Arises from chair without difficulty. Stance is normal. Gait demonstrates normal stride length and balance . Able to heel, toe and tandem walk without difficulty.  Reflexes: 1+ and symmetric. Toes downgoing.   NIHSS  1 Modified Rankin  1  ASSESSMENT: 12 year caucasian male with transient left-sided vision difficulties do to embolic right right PCA and MCA branch infarcts.    PLAN: I had a long discussion the patient with regards to his symptoms, discuss the results of available tests, risk factor modification, stroke prevention and answered questions.He was advised to discontinue aspirin and stay on Plavix alone for secondary  stroke prevention. Maintain strict control of hypertension with blood pressure goal below 130/90 and lipids with LDL cholesterol goal below 100 mg percent. Check a life watch cardiac monitor for paroxysmal atrial fibrillation and transthoracic echo and TEE. He was advised to be careful with his driving. Return for followup in 6 weeks or call earlier if necessary.

## 2013-10-06 ENCOUNTER — Encounter: Payer: Self-pay | Admitting: Internal Medicine

## 2013-10-06 NOTE — Telephone Encounter (Signed)
Called Jose [name] to clarify.  He will come in at 10 am on Wed for appt with Kennon RoundsSally to discuss Xarelto.

## 2013-10-08 ENCOUNTER — Ambulatory Visit: Payer: Medicare Other | Admitting: Internal Medicine

## 2013-10-08 ENCOUNTER — Ambulatory Visit: Payer: Medicare Other | Admitting: Pharmacist

## 2013-10-08 ENCOUNTER — Ambulatory Visit (INDEPENDENT_AMBULATORY_CARE_PROVIDER_SITE_OTHER): Payer: Medicare Other | Admitting: *Deleted

## 2013-10-08 DIAGNOSIS — I4891 Unspecified atrial fibrillation: Secondary | ICD-10-CM

## 2013-10-08 LAB — BASIC METABOLIC PANEL
BUN: 21 mg/dL (ref 6–23)
CALCIUM: 9.5 mg/dL (ref 8.4–10.5)
CO2: 27 meq/L (ref 19–32)
Chloride: 103 mEq/L (ref 96–112)
Creatinine, Ser: 1.3 mg/dL (ref 0.4–1.5)
GFR: 55.41 mL/min — AB (ref >60.00)
Glucose, Bld: 93 mg/dL (ref 70–99)
POTASSIUM: 4.3 meq/L (ref 3.5–5.1)
SODIUM: 139 meq/L (ref 135–145)

## 2013-10-08 LAB — CBC
HEMATOCRIT: 48.8 % (ref 39.0–52.0)
Hemoglobin: 16.1 g/dL (ref 13.0–17.0)
MCHC: 33.1 g/dL (ref 30.0–36.0)
MCV: 94.2 fl (ref 78.0–100.0)
PLATELETS: 136 10*3/uL — AB (ref 150.0–400.0)
RBC: 5.18 Mil/uL (ref 4.22–5.81)
RDW: 13.6 % (ref 11.5–14.6)
WBC: 5.9 10*3/uL (ref 4.5–10.5)

## 2013-10-08 NOTE — Progress Notes (Signed)
Pt was started on Xarelto 20 mg daily  for Atrial Fib on January 14th 2015.    Reviewed patients medication list.  Pt is not  currently on any combined P-gp and strong CYP3A4 inhibitors/inducers (ketoconazole, traconazole, ritonavir, carbamazepine, phenytoin, rifampin, St. John's wort).  Reviewed labs.  SrCr 1.3, Weight 81.54 kg CrCl- 52.26.  Dose is appropriate based on CrCl.   Hgb 16.1 and HCT 48.8    A full discussion of the nature of anticoagulants has been carried out.  A benefit/risk analysis has been presented to the patient, so that they understand the justification for choosing anticoagulation with Xarelto at this time.  The need for compliance is stressed.  Pt is aware to take the medication once daily with the largest meal of the day.  Side effects of potential bleeding are discussed, including unusual colored urine or stools, coughing up blood or coffee ground emesis, nose bleeds or serious fall or head trauma.  Discussed signs and symptoms of stroke. The patient should avoid any OTC items containing aspirin or ibuprofen.  Avoid alcohol consumption.   Call if any signs of abnormal bleeding.  Discussed financial obligations and state is not having any  difficulty in obtaining medication.  Next lab test test in 6 months.  Pt states has noted occasional bleeding from gums when brushes teeth and occasional nose bleed. Also has been diagnosed with Varicose veins upper right  thigh  states has pain in groin area and edema present MD is aware of this.  10/10/2013 Talked with pt this am and instructed that labs are wnl and have talked with Dr Jimmye NormanSmart regarding dose and instructed to continue on same dose Xarelto 20 mg daily . Pt states did have slight nose bleed this am instructed to continue to monitor and if continues to see PCP regarding this and to call clinic with any problems.Appt made for pt to be seen in  Clinic in 6 months and also instructed to call with any problems and pt states  understanding

## 2013-10-22 ENCOUNTER — Ambulatory Visit: Payer: Medicare Other | Admitting: Pharmacist

## 2014-01-02 ENCOUNTER — Encounter: Payer: Self-pay | Admitting: Nurse Practitioner

## 2014-01-02 ENCOUNTER — Ambulatory Visit (INDEPENDENT_AMBULATORY_CARE_PROVIDER_SITE_OTHER): Payer: Medicare Other | Admitting: Nurse Practitioner

## 2014-01-02 VITALS — BP 128/70 | HR 64 | Ht 69.0 in | Wt 181.0 lb

## 2014-01-02 DIAGNOSIS — I2510 Atherosclerotic heart disease of native coronary artery without angina pectoris: Secondary | ICD-10-CM

## 2014-01-02 DIAGNOSIS — I6350 Cerebral infarction due to unspecified occlusion or stenosis of unspecified cerebral artery: Secondary | ICD-10-CM

## 2014-01-02 DIAGNOSIS — I4891 Unspecified atrial fibrillation: Secondary | ICD-10-CM

## 2014-01-02 NOTE — Progress Notes (Signed)
PATIENT: Jose BergamoHenry V Zinn DOB: July 15, 1931  REASON FOR VISIT: routine stroke follow up HISTORY FROM: patient  HISTORY OF PRESENT ILLNESS: UPDATE 01/02/14 (LL): Patient returns for stroke revisit.  He states his blood pressure is well controlled, it is 128/70 in our office today.  He is tolerating Xarelto well with no signs of significant bleeding or bruising.  His peripheral vision has corrected and he has no further problems with his vision except redness and itching from Spring allergies.  He continues to play golf 3 x week and feels well.  Update 09/17/13 (PS): He returns for followup after last visit 2 months ago. He was found to have paroxysmal atrial fibrillation on life watch monitor with controlled heart rate, CHADs2VASC score was 6 and I prescribed Eliquis but has seen Dr. Johney FrameAllred cardiologist on 09/10/13 who has started him on Xarelto instead do to cost reasons. He seems to be tolerating it well without significant bleeding and only minor bruising. He had lab work done on 07/10/13 which showed normal lipid profile and hemoglobin A1c was borderline at 6.2. He continues to have mild residual left-sided peripheral vision difficulties but has been able to drive okay and has not had any accidents or near misses. He has noticed for the last several years that he has a little bit of fullness and swelling in his right groin and has not yet sought any medical evaluation for this. He denies significant pain but does notice some fullness in the groin.   Initial Consult 07/09/2013 ;[age] year Caucasian male who developed sudden onset of left-sided peripheral vision difficulties on 07/02/13. He noted that he was in black spots on the left side of field of vision and difficulty reading sentences as he could read only parts of the words towards the right and was missing portions on the left. He also developed mild bifrontal headaches which he thought was related to sinuses. The headache was mild and intermittent  and did not bother him. Denied any double vision, vertigo, gait, balance difficulties, memory loss focal weakness or numbness. He has no prior history of strokes. He does have vascular factors of hypertension since age 80 as well as coronary artery disease and underwent CABG 7 years ago. He also has hyperlipidemia for which she takes protocol which and lipids were last checked in May and was fine. He had MRI scan of the brain done on 07/07/13 which I personally reviewed and shows a right occipital as well as right frontal MCA branch acute infarcts. MRA of the brain shows no large vessel intracranial stenosis. MRA of the neck showed no extracranial stenosis either. He has a 2-D echocardiogram planned for tomorrow. He was previously on aspirin and Plavix was added by Dr. Lorenz CoasterKeller. He states that his peripheral vision symptoms improved fairly quickly he still has some difficulty while reading and on the extreme left peripheral visual field.   REVIEW OF SYSTEMS: Full 14 system review of systems performed and notable only for:  Eye itching, eye redness only  ALLERGIES: Allergies  Allergen Reactions  . Iohexol Anaphylaxis     THROAT SWELLS AND STOPS BREATHING   . Codeine Other (See Comments)      Reaction not known  . Sulfonamide Derivatives Other (See Comments)      Reaction not known    HOME MEDICATIONS: Outpatient Prescriptions Prior to Visit  Medication Sig Dispense Refill  . amLODipine (NORVASC) 5 MG tablet Take 5 mg by mouth daily.        .Marland Kitchen  losartan (COZAAR) 100 MG tablet Take 100 mg by mouth daily.      . Multiple Vitamin (MULTIVITAMIN) capsule Take 1 capsule by mouth daily.        . niacin (NIASPAN) 500 MG CR tablet Take 500 mg by mouth daily.        Marland Kitchen. omeprazole (PRILOSEC) 20 MG capsule Take 20 mg by mouth daily.        . polyethylene glycol (MIRALAX / GLYCOLAX) packet Take 17 g by mouth daily.      . pravastatin (PRAVACHOL) 80 MG tablet Take 80 mg by mouth daily.        . Rivaroxaban  (XARELTO) 20 MG TABS tablet Take 1 tablet (20 mg total) by mouth daily with supper.  30 tablet  11  . saw palmetto 160 MG capsule Take 160 mg by mouth 2 (two) times daily.        . tadalafil (CIALIS) 5 MG tablet Take 5 mg by mouth daily.         No facility-administered medications prior to visit.     PHYSICAL EXAM  Filed Vitals:   01/02/14 1315  BP: 128/70  Pulse: 64  Height: 5\' 9"  (1.753 m)  Weight: 181 lb (82.101 kg)   Body mass index is 26.72 kg/(m^2).  General: well developed, well nourished, seated, in no evident distress  Head: head normocephalic and atraumatic. Orohparynx benign  Neck: supple with no carotid or supraclavicular bruits  Cardiovascular: regular rate and rhythm, no murmurs  Musculoskeletal: no deformity  Skin: no rash/petichiae  Vascular: Normal pulses all extremities   Neurologic Exam  Mental Status: Awake and fully alert. Oriented to place and time. Recent and remote memory intact. Attention span, concentration and fund of knowledge appropriate. Mood and affect appropriate.  Cranial Nerves: Pupils equal, briskly reactive to light. Extraocular movements full without nystagmus. Visual fields almostfull to confrontation with only mild extreme left peripheral temporal field defect to bedside confrontational testing. Hearing intact. Facial sensation intact. Face, tongue, palate moves normally and symmetrically.  Motor: Normal bulk and tone. Normal strength in all tested extremity muscles.  Sensory: intact to touch and pinprick and vibratory.  Coordination: Rapid alternating movements normal in all extremities. Finger-to-nose and heel-to-shin performed accurately bilaterally.  Gait and Station: Arises from chair without difficulty. Stance is normal. Gait demonstrates normal stride length and balance . Able to heel, toe and tandem walk without difficulty.  Reflexes: 1+ and symmetric.   ASSESSMENT AND PLAN 80 y.o. year old male  has a past medical history of  Coronary artery disease; Chest pain; Hypertension; Other malaise and fatigue; Psychosexual dysfunction with inhibited sexual excitement; Stroke; and Paroxysmal atrial fibrillation. here with  with transient left-sided vision difficulties do to embolic right right PCA and MCA branch infarcts, now on Xarelto.   PLAN:  Continue Xarelto  for secondary stroke prevention and maintain strict control of hypertension with blood pressure goal below 130/90, diabetes with hemoglobin A1c goal below 6.5% and lipids with LDL cholesterol goal below 100 mg/dL.  Followup in 6 months, sooner as needed.  Ronal FearLYNN E. Terrence Pizana, MSN, NP-C 01/02/2014, 1:22 PM [facility] [address], [address] [place], [place] [address] [phone]  Note: This document was prepared with digital dictation and possible smart phrase technology. Any transcriptional errors that result from this process are unintentional.

## 2014-01-02 NOTE — Patient Instructions (Signed)
Continue Xarelto  for atrial fibrillation as secondary stroke prevention and maintain strict control of hypertension with blood pressure goal below 130/90, diabetes with hemoglobin A1c goal below 6.5% and lipids with LDL cholesterol goal below 100 mg/dL.  Followup in 6 months, sooner as needed.

## 2014-03-20 ENCOUNTER — Telehealth: Payer: Self-pay | Admitting: Cardiology

## 2014-03-20 NOTE — Telephone Encounter (Signed)
New message          Pt would like to be worked in on Worthy ScheinCrenshaw's schedule around August 12 / pt is a former pt of wall

## 2014-03-20 NOTE — Telephone Encounter (Signed)
Returned call to patient he stated he is a former patient of Dr.Wall and he is past due for his follow up.Stated he would like to see Dr.Crenshaw 04/09/14.Message sent to Dr.Crenshaw's nurse Deliah Goodyebra Mathis RN for a appointment.

## 2014-03-23 NOTE — Telephone Encounter (Signed)
Spoke with pt, Follow up scheduled  

## 2014-03-31 ENCOUNTER — Other Ambulatory Visit (HOSPITAL_COMMUNITY): Payer: Self-pay | Admitting: Urology

## 2014-03-31 DIAGNOSIS — R310 Gross hematuria: Secondary | ICD-10-CM

## 2014-04-03 ENCOUNTER — Ambulatory Visit (HOSPITAL_COMMUNITY)
Admission: RE | Admit: 2014-04-03 | Discharge: 2014-04-03 | Disposition: A | Discharge status: Home / Self Care | Payer: Medicare Other | Source: Ambulatory Visit | Attending: Diagnostic Radiology | Admitting: Diagnostic Radiology

## 2014-04-03 DIAGNOSIS — N135 Crossing vessel and stricture of ureter without hydronephrosis: Secondary | ICD-10-CM | POA: Insufficient documentation

## 2014-04-03 DIAGNOSIS — R310 Gross hematuria: Secondary | ICD-10-CM | POA: Diagnosis present

## 2014-04-03 MED ORDER — FUROSEMIDE 10 MG/ML IJ SOLN: 41.0000 mg/kg | Freq: Once | INTRAVENOUS | Status: DC

## 2014-04-03 MED ORDER — TECHNETIUM TC 99M MERTIATIDE
15.0000 | Freq: Once | INTRAVENOUS | Status: AC | PRN
  Administered 2014-04-03: 15 via INTRAVENOUS

## 2014-04-03 MED ORDER — FUROSEMIDE 10 MG/ML IJ SOLN
41.0000 mg | Freq: Once | INTRAMUSCULAR | Status: AC
  Administered 2014-04-03: 41 mg via INTRAVENOUS
  Filled 2014-04-03: qty 6

## 2014-04-08 ENCOUNTER — Ambulatory Visit (INDEPENDENT_AMBULATORY_CARE_PROVIDER_SITE_OTHER): Payer: Medicare Other | Admitting: *Deleted

## 2014-04-08 DIAGNOSIS — Z5181 Encounter for therapeutic drug level monitoring: Secondary | ICD-10-CM

## 2014-04-08 DIAGNOSIS — I4891 Unspecified atrial fibrillation: Secondary | ICD-10-CM

## 2014-04-08 DIAGNOSIS — I480 Paroxysmal atrial fibrillation: Secondary | ICD-10-CM

## 2014-04-08 LAB — CBC
HCT: 38.5 % — ABNORMAL LOW (ref 39.0–52.0)
HEMOGLOBIN: 13.1 g/dL (ref 13.0–17.0)
MCHC: 34 g/dL (ref 30.0–36.0)
MCV: 91.8 fl (ref 78.0–100.0)
Platelets: 152 10*3/uL (ref 150.0–400.0)
RBC: 4.19 Mil/uL — ABNORMAL LOW (ref 4.22–5.81)
RDW: 17.4 % — AB (ref 11.5–15.5)
WBC: 6.3 10*3/uL (ref 4.0–10.5)

## 2014-04-08 NOTE — Progress Notes (Signed)
Pt was started on Xarelto 20 mg daily  for Atrial Fib on January 14,2015.    Reviewed patients medication list.  Pt is not  currently on any combined P-gp and strong CYP3A4 inhibitors/inducers (ketoconazole, traconazole, ritonavir, carbamazepine, phenytoin, rifampin, St. John's wort).  Reviewed labs.  SCr 1.32, (03/24/2014) Weight 78.63 , CrCl- 50.4.  Dose is appropriate  based on CrCl.   Hgb 13.1  and HCT 38.5  A full discussion of the nature of anticoagulants has been carried out.  A benefit/risk analysis has been presented to the patient, so that they understand the justification for choosing anticoagulation with Xarelto at this time.  The need for compliance is stressed.  Pt is aware to take the medication once daily with the largest meal of the day.  Side effects of potential bleeding are discussed, including unusual colored urine or stools, coughing up blood or coffee ground emesis, nose bleeds or serious fall or head trauma.  Discussed signs and symptoms of stroke. The patient should avoid any OTC items containing aspirin or ibuprofen.  Avoid alcohol consumption.   Call if any signs of abnormal bleeding.  Discussed financial obligations having no difficulty in obtaining medication.  Next lab test test in 6 months.  Pt states noted blood in urine about 3 weeks ago but stopped last Friday . 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 Kidney function right  40 % and 60 % on left. Stated did have nose bleed 1 morning a month ago States has had no sign or symtom of stroke. States has started on Vitamin D and does take an occasional Naproxyn.  Spoke with pt and instructed that according to CrCl is on correct dose of Xarelto and made an appt for recheck in 6 months and pt states understanding

## 2014-04-22 ENCOUNTER — Ambulatory Visit (INDEPENDENT_AMBULATORY_CARE_PROVIDER_SITE_OTHER): Payer: Medicare Other | Admitting: Cardiology

## 2014-04-22 ENCOUNTER — Encounter: Payer: Self-pay | Admitting: Cardiology

## 2014-04-22 VITALS — BP 128/58 | HR 70 | Ht 69.5 in | Wt 177.9 lb

## 2014-04-22 DIAGNOSIS — E785 Hyperlipidemia, unspecified: Secondary | ICD-10-CM

## 2014-04-22 DIAGNOSIS — I480 Paroxysmal atrial fibrillation: Secondary | ICD-10-CM

## 2014-04-22 DIAGNOSIS — I4891 Unspecified atrial fibrillation: Secondary | ICD-10-CM

## 2014-04-22 DIAGNOSIS — I2510 Atherosclerotic heart disease of native coronary artery without angina pectoris: Secondary | ICD-10-CM

## 2014-04-22 LAB — BASIC METABOLIC PANEL WITH GFR
BUN: 22 mg/dL (ref 6–23)
CO2: 27 mEq/L (ref 19–32)
Calcium: 9.5 mg/dL (ref 8.4–10.5)
Chloride: 103 mEq/L (ref 96–112)
Creat: 1.38 mg/dL — ABNORMAL HIGH (ref 0.50–1.35)
GFR, EST NON AFRICAN AMERICAN: 48 mL/min — AB
GFR, Est African American: 55 mL/min — ABNORMAL LOW
Glucose, Bld: 76 mg/dL (ref 70–99)
Potassium: 4.2 mEq/L (ref 3.5–5.3)
Sodium: 139 mEq/L (ref 135–145)

## 2014-04-22 LAB — LIPID PANEL
CHOLESTEROL: 118 mg/dL (ref 0–200)
HDL: 45 mg/dL (ref >39)
LDL Cholesterol: 57 mg/dL (ref 0–99)
Total CHOL/HDL Ratio: 2.6 Ratio
Triglycerides: 79 mg/dL (ref <150)
VLDL: 16 mg/dL (ref 0–40)

## 2014-04-22 LAB — HEPATIC FUNCTION PANEL
ALT: 12 U/L (ref 0–53)
AST: 15 U/L (ref 0–37)
Albumin: 4.2 g/dL (ref 3.5–5.2)
Alkaline Phosphatase: 92 U/L (ref 39–117)
BILIRUBIN DIRECT: 0.1 mg/dL (ref 0.0–0.3)
Indirect Bilirubin: 0.4 mg/dL (ref 0.2–1.2)
Total Bilirubin: 0.5 mg/dL (ref 0.2–1.2)
Total Protein: 7.3 g/dL (ref 6.0–8.3)

## 2014-04-22 NOTE — Assessment & Plan Note (Signed)
History of paroxysmal atrial fibrillation and CVA. Continue xarelto. Recent hemoglobin normal. Check renal function. His rate previously was controlled on no medications.

## 2014-04-22 NOTE — Assessment & Plan Note (Signed)
Blood pressure controlled. Continue present medications. Check potassium and renal function. 

## 2014-04-22 NOTE — Progress Notes (Signed)
HPI: 80 yo male previously followed by Dr Daleen Squibb for fu of CAD and atrial fibrillation. Patient underwent coronary artery bypass and graft in 2007. He had a LIMA to the LAD, saphenous vein graft to first diagonal, sequential saphenous vein graft to the first marginal and second marginal, sequential saphenous vein graft to the PDA and posterior lateral. He also had closure of PFO. Abdominal ultrasound May 2014 showed no aneurysm. Had CVA in November of 2014. Echocardiogram November 2014 showed an ejection fraction of 45-50%, grade 1 diastolic dysfunction and mild left atrial enlargement. Transesophageal echocardiogram November 2014 showed normal LV function, mild prolapse of the posterior mitral valve leaflet and an atrial septal aneurysm with negative bubble study. Carotid Dopplers November 2014 showed less than 40% bilateral stenosis. 30 day event monitor showed atrial fibrillation. Since last seen, He denies dyspnea, chest pain, palpitations or syncope.  Current Outpatient Prescriptions  Medication Sig Dispense Refill  . amLODipine (NORVASC) 5 MG tablet Take 5 mg by mouth daily.        . cefUROXime (CEFTIN) 250 MG tablet       . cholecalciferol (VITAMIN D) 1000 UNITS tablet Take 1,000 Units by mouth daily.      Marland Kitchen losartan (COZAAR) 100 MG tablet Take 100 mg by mouth daily.      . Multiple Vitamin (MULTIVITAMIN) capsule Take 1 capsule by mouth daily.        . niacin (NIASPAN) 500 MG CR tablet Take 500 mg by mouth daily.        Marland Kitchen omeprazole (PRILOSEC) 20 MG capsule Take 20 mg by mouth daily.        . polyethylene glycol (MIRALAX / GLYCOLAX) packet Take 17 g by mouth daily.      . pravastatin (PRAVACHOL) 80 MG tablet Take 80 mg by mouth daily.        . Rivaroxaban (XARELTO) 20 MG TABS tablet Take 1 tablet (20 mg total) by mouth daily with supper.  30 tablet  11  . saw palmetto 160 MG capsule Take 160 mg by mouth 2 (two) times daily.        . tadalafil (CIALIS) 5 MG tablet Take 5 mg by mouth  daily.        . Testosterone 10 MG/ACT (2%) GEL Place onto the skin.       No current facility-administered medications for this visit.     Past Medical History  Diagnosis Date  . Coronary artery disease   . Chest pain   . Hypertension   . Other malaise and fatigue   . Psychosexual dysfunction with inhibited sexual excitement   . Stroke   . Paroxysmal atrial fibrillation     Past Surgical History  Procedure Laterality Date  . Coronary artery bypass graft    . Appendectomy    . Cholecystectomy    . Inguinal hernia repair    . Urteral obstruction      multiple repair transurethal procedures for  . Tee without cardioversion N/A 07/17/2013    Procedure: TRANSESOPHAGEAL ECHOCARDIOGRAM (TEE);  Surgeon: Wendall Stade, MD;  Location: [facility] ENDOSCOPY;  Service: Cardiovascular;  Laterality: N/A;    History   Social History  . Marital Status: Married    Spouse Name: maxine    Number of Children: 4  . Years of Education: college4   Occupational History  . retired    Social History Main Topics  . Smoking status: Former Smoker -- 1.00 packs/day for 35 years  Types: Cigarettes    Quit date: 08/29/1977  . Smokeless tobacco: Never Used  . Alcohol Use: No  . Drug Use: No  . Sexual Activity: Yes   Other Topics Concern  . Not on file   Social History Narrative  . No narrative on file    ROS: no fevers or chills, productive cough, hemoptysis, dysphasia, odynophagia, melena, hematochezia, dysuria, hematuria, rash, seizure activity, orthopnea, PND, pedal edema, claudication. Remaining systems are negative.  Physical Exam: Well-developed well-nourished in no acute distress.  Skin is warm and dry.  HEENT is normal.  Neck is supple.  Chest is clear to auscultation with normal expansion.  Cardiovascular exam is regular rate and rhythm.  Abdominal exam nontender or distended. No masses palpated. Extremities show no edema. neuro grossly intact  ECG Sinus rhythm at a rate of  70. Occasional PVC. Nonspecific ST changes.

## 2014-04-22 NOTE — Assessment & Plan Note (Signed)
Continue statin. Not on aspirin given need for anti-coagulation. He is scheduled to see urology tomorrow for evaluation of recent hematuria. If he requires surgical procedure I would like for him to have a functional study preoperatively.

## 2014-04-22 NOTE — Patient Instructions (Signed)
Your physician wants you to follow-up in: 6 MONTHS WITH DR CRENSHAW IN [place] You will receive a reminder letter in the mail two months in advance. If you don't receive a letter, please call our office to schedule the follow-up appointment.   Your physician recommends that you HAVE LAB WORK TODAY 

## 2014-04-22 NOTE — Assessment & Plan Note (Signed)
Continue present medications. Check lipids and liver. 

## 2014-04-23 ENCOUNTER — Telehealth: Payer: Self-pay | Admitting: Cardiology

## 2014-04-23 MED ORDER — RIVAROXABAN 15 MG PO TABS: 15.0000 mg | ORAL_TABLET | Freq: Two times a day (BID) | ORAL | Status: DC

## 2014-04-23 NOTE — Telephone Encounter (Signed)
Spoke with pt, aware of labs Patient voiced understanding of new medication dosage

## 2014-04-23 NOTE — Telephone Encounter (Signed)
Pt called stating that he was returning Debra's call.  Thanks

## 2014-04-28 ENCOUNTER — Other Ambulatory Visit: Payer: Self-pay | Admitting: Urology

## 2014-04-28 ENCOUNTER — Other Ambulatory Visit (HOSPITAL_COMMUNITY): Payer: Self-pay | Admitting: Urology

## 2014-04-28 DIAGNOSIS — Q6211 Congenital occlusion of ureteropelvic junction: Principal | ICD-10-CM

## 2014-04-28 DIAGNOSIS — Q6239 Other obstructive defects of renal pelvis and ureter: Secondary | ICD-10-CM

## 2014-05-13 ENCOUNTER — Encounter (HOSPITAL_BASED_OUTPATIENT_CLINIC_OR_DEPARTMENT_OTHER): Payer: Self-pay | Admitting: *Deleted

## 2014-05-14 ENCOUNTER — Encounter (HOSPITAL_BASED_OUTPATIENT_CLINIC_OR_DEPARTMENT_OTHER): Payer: Self-pay | Admitting: *Deleted

## 2014-05-14 ENCOUNTER — Telehealth: Payer: Self-pay | Admitting: Cardiology

## 2014-05-14 DIAGNOSIS — I2510 Atherosclerotic heart disease of native coronary artery without angina pectoris: Secondary | ICD-10-CM

## 2014-05-14 NOTE — Progress Notes (Signed)
NOTED DR CRENSHAW NOTE, PT CARDIOLOGIST, THAT PT NEEDED FUNCTIONAL STUDY BEFORE SURGICAL PROCEDURE AND THE OK TO STOP XARELTO OR NOT. CALLED AND SPOKE W/ PAM , DR Patsi Sears OR SCHEDULER, SHE WILL CALL FOR CLEARANCE.

## 2014-05-14 NOTE — Telephone Encounter (Signed)
DC xarelto 2 days prior to procedure and resume day after if bleeding controlled Jose [name]

## 2014-05-14 NOTE — Telephone Encounter (Signed)
New message     [facility] calling   Patient Surgery on 9/24    Pt on xarelto  - when & how many days patient to need to stop medication.

## 2014-05-14 NOTE — Telephone Encounter (Signed)
Deferred to Dr. Jens Som for review and recommendation.

## 2014-05-15 NOTE — Telephone Encounter (Signed)
Per last office note, pt will need lexiscan for clearance. Spoke with pt, lexiscan scheduled. [facility] made aware

## 2014-05-19 ENCOUNTER — Ambulatory Visit (HOSPITAL_COMMUNITY)
Admission: RE | Admit: 2014-05-19 | Discharge: 2014-05-19 | Disposition: A | Discharge status: Home / Self Care | Payer: Medicare Other | Source: Ambulatory Visit | Attending: Cardiovascular Disease | Admitting: Cardiovascular Disease

## 2014-05-19 DIAGNOSIS — R5381 Other malaise: Secondary | ICD-10-CM | POA: Insufficient documentation

## 2014-05-19 DIAGNOSIS — Z8673 Personal history of transient ischemic attack (TIA), and cerebral infarction without residual deficits: Secondary | ICD-10-CM | POA: Diagnosis not present

## 2014-05-19 DIAGNOSIS — Z8249 Family history of ischemic heart disease and other diseases of the circulatory system: Secondary | ICD-10-CM | POA: Insufficient documentation

## 2014-05-19 DIAGNOSIS — Z7901 Long term (current) use of anticoagulants: Secondary | ICD-10-CM | POA: Diagnosis not present

## 2014-05-19 DIAGNOSIS — I2510 Atherosclerotic heart disease of native coronary artery without angina pectoris: Secondary | ICD-10-CM

## 2014-05-19 DIAGNOSIS — R079 Chest pain, unspecified: Secondary | ICD-10-CM | POA: Insufficient documentation

## 2014-05-19 DIAGNOSIS — R5383 Other fatigue: Secondary | ICD-10-CM

## 2014-05-19 MED ORDER — REGADENOSON 0.4 MG/5ML IV SOLN
0.4000 mg | Freq: Once | INTRAVENOUS | Status: AC
  Administered 2014-05-19: 0.4 mg via INTRAVENOUS

## 2014-05-19 MED ORDER — TECHNETIUM TC 99M SESTAMIBI GENERIC - CARDIOLITE
31.5000 | Freq: Once | INTRAVENOUS | Status: AC | PRN
  Administered 2014-05-19: 32 via INTRAVENOUS

## 2014-05-19 MED ORDER — TECHNETIUM TC 99M SESTAMIBI GENERIC - CARDIOLITE
10.3000 | Freq: Once | INTRAVENOUS | Status: AC | PRN
  Administered 2014-05-19: 10 via INTRAVENOUS

## 2014-05-19 NOTE — Procedures (Addendum)
 [facility] [address] [place] [address] [phone]  Cardiology Nuclear Med Study  Jose [name] is a 80 y.o. male     MRN : [id number]     DOB: 1930/11/24  Procedure Date: 05/19/2014  Nuclear Med Background Indication for Stress Test:  Surgical Clearance and Graft Patency History:  CAD, CABGx6 2007, No prior nuc MPI for comparison Cardiac Risk Factors: CVA, Family History - CAD, History of Smoking, Hypertension and Lipids  Symptoms:  Chest Pain and Fatigue   Nuclear Pre-Procedure Caffeine/Decaff Intake:  9:30pm NPO After: 7:30am   IV Site: R Antecubital  IV 0.9% NS with Angio Cath:  22g  Chest Size (in):  42 IV Started by: Koren Shiver, CNMT  Height:  (1.778 m)  Cup Size: n/a  BMI:  Body mass index is 25.4 kg/(m^2). Weight:  177 lb (80.287 kg)   Tech Comments:  n/a    Nuclear Med Study 1 or 2 day study: 1 day  Stress Test Type:  Lexiscan  Order Authorizing Provider:  Olga Millers, MD   Resting Radionuclide: Technetium 43m Sestamibi  Resting Radionuclide Dose: 10.3 mCi   Stress Radionuclide:  Technetium 45m Sestamibi  Stress Radionuclide Dose: 31.5 mCi           Stress Protocol Rest HR: 62 Stress HR: 75  Rest BP:150/90 Stress BP:166/64  Exercise Time (min): n/a METS: n/a          Dose of Adenosine (mg):  n/a Dose of Lexiscan: 0.4 mg  Dose of Atropine (mg): n/a Dose of Dobutamine: n/a mcg/kg/min (at max HR)  Stress Test Technologist: Ernestene Mention, CCT Nuclear Technologist: Gonzella Lex, CNMT   Rest Procedure:  Myocardial perfusion imaging was performed at rest 45 minutes following the intravenous administration of Technetium 78m Sestamibi. Stress Procedure:  The patient received IV Lexiscan 0.4 mg over 15-seconds.  Technetium 74m Sestamibi injected Iv at 30-seconds.  There were no significant changes with Lexiscan.  Quantitative spect images were obtained after a 45 minute  delay.  Transient Ischemic Dilatation (Normal <1.22):  1.22  QGS EDV:  131 ml QGS ESV:  61 ml LV Ejection Fraction: 53%        Rest ECG: NSR - Normal EKG  Stress ECG: No significant change from baseline ECG  QPS Raw Data Images:  Normal; no motion artifact; normal heart/lung ratio. Stress Images:  There is decreased uptake in the inferior wall. Rest Images:  There is decreased uptake in the inferior wall. Subtraction (SDS):  There is a fixed defect that is most consistent with a previous infarction.  Impression Exercise Capacity:  Lexiscan with no exercise. BP Response:  Normal blood pressure response. Clinical Symptoms:  No significant symptoms noted. ECG Impression:  No significant ST segment change suggestive of ischemia. Comparison with Prior Nuclear Study: No images to compare  Overall Impression:  Low risk stress nuclear study Inferobasal scar without ischemia.  LV Wall Motion:  NL LV Function; NL Wall Motion   Runell Gess, MD  05/19/2014 12:20 PM

## 2014-05-20 ENCOUNTER — Telehealth: Payer: Self-pay | Admitting: *Deleted

## 2014-05-20 ENCOUNTER — Encounter (HOSPITAL_BASED_OUTPATIENT_CLINIC_OR_DEPARTMENT_OTHER): Payer: Self-pay | Admitting: *Deleted

## 2014-05-20 NOTE — Telephone Encounter (Signed)
Nuclear testing okay and clearance given.

## 2014-05-20 NOTE — Progress Notes (Signed)
NPO AFTER MN. ARRIVE AT 0800. NEEDS ISTAT. CURRENT EKG IN CHART AND EPIC. WILL TAKE AM MEDS W/ SIPS OF WATER.

## 2014-05-20 NOTE — Telephone Encounter (Signed)
Clearance note on myoview and telephone note for okay to hold xarelto prior to procedure faxed.

## 2014-05-20 NOTE — Progress Notes (Signed)
05/20/14 1010  OBSTRUCTIVE SLEEP APNEA  Have you ever been diagnosed with sleep apnea through a sleep study? No  Do you snore loudly (loud enough to be heard through closed doors)?  1  Do you often feel tired, fatigued, or sleepy during the daytime? 0  Has anyone observed you stop breathing during your sleep? 0  Do you have, or are you being treated for high blood pressure? 1  BMI more than 35 kg/m2? 0  Age over 78 years old? 1  Neck circumference greater than 40 cm/16 inches? 1  Gender: 1  Obstructive Sleep Apnea Score 5  Score 4 or greater  Results sent to PCP

## 2014-05-21 ENCOUNTER — Encounter (HOSPITAL_BASED_OUTPATIENT_CLINIC_OR_DEPARTMENT_OTHER): Payer: Self-pay | Admitting: *Deleted

## 2014-05-21 ENCOUNTER — Encounter (HOSPITAL_BASED_OUTPATIENT_CLINIC_OR_DEPARTMENT_OTHER): Payer: Medicare Other | Admitting: Anesthesiology

## 2014-05-21 ENCOUNTER — Ambulatory Visit (HOSPITAL_BASED_OUTPATIENT_CLINIC_OR_DEPARTMENT_OTHER)
Admission: RE | Admit: 2014-05-21 | Discharge: 2014-05-21 | Disposition: A | Discharge status: Home / Self Care | Payer: Medicare Other | Source: Ambulatory Visit | Attending: Urology | Admitting: Urology

## 2014-05-21 ENCOUNTER — Encounter (HOSPITAL_BASED_OUTPATIENT_CLINIC_OR_DEPARTMENT_OTHER)
Admission: RE | Disposition: A | Discharge status: Home / Self Care | Payer: Self-pay | Source: Ambulatory Visit | Attending: Urology

## 2014-05-21 ENCOUNTER — Ambulatory Visit (HOSPITAL_BASED_OUTPATIENT_CLINIC_OR_DEPARTMENT_OTHER): Payer: Medicare Other | Admitting: Anesthesiology

## 2014-05-21 DIAGNOSIS — R32 Unspecified urinary incontinence: Secondary | ICD-10-CM | POA: Insufficient documentation

## 2014-05-21 DIAGNOSIS — R310 Gross hematuria: Secondary | ICD-10-CM | POA: Insufficient documentation

## 2014-05-21 DIAGNOSIS — N1330 Unspecified hydronephrosis: Secondary | ICD-10-CM | POA: Diagnosis present

## 2014-05-21 DIAGNOSIS — Z888 Allergy status to other drugs, medicaments and biological substances status: Secondary | ICD-10-CM | POA: Diagnosis not present

## 2014-05-21 DIAGNOSIS — Z880 Allergy status to penicillin: Secondary | ICD-10-CM | POA: Insufficient documentation

## 2014-05-21 DIAGNOSIS — Z79899 Other long term (current) drug therapy: Secondary | ICD-10-CM | POA: Insufficient documentation

## 2014-05-21 DIAGNOSIS — R350 Frequency of micturition: Secondary | ICD-10-CM | POA: Diagnosis not present

## 2014-05-21 DIAGNOSIS — R351 Nocturia: Secondary | ICD-10-CM | POA: Diagnosis not present

## 2014-05-21 DIAGNOSIS — Z8673 Personal history of transient ischemic attack (TIA), and cerebral infarction without residual deficits: Secondary | ICD-10-CM | POA: Insufficient documentation

## 2014-05-21 DIAGNOSIS — K219 Gastro-esophageal reflux disease without esophagitis: Secondary | ICD-10-CM | POA: Diagnosis not present

## 2014-05-21 DIAGNOSIS — Z91041 Radiographic dye allergy status: Secondary | ICD-10-CM | POA: Diagnosis not present

## 2014-05-21 DIAGNOSIS — Z885 Allergy status to narcotic agent status: Secondary | ICD-10-CM | POA: Diagnosis not present

## 2014-05-21 DIAGNOSIS — E7800 Pure hypercholesterolemia, unspecified: Secondary | ICD-10-CM | POA: Diagnosis not present

## 2014-05-21 DIAGNOSIS — Z882 Allergy status to sulfonamides status: Secondary | ICD-10-CM | POA: Insufficient documentation

## 2014-05-21 DIAGNOSIS — N401 Enlarged prostate with lower urinary tract symptoms: Secondary | ICD-10-CM | POA: Insufficient documentation

## 2014-05-21 DIAGNOSIS — N135 Crossing vessel and stricture of ureter without hydronephrosis: Secondary | ICD-10-CM | POA: Diagnosis not present

## 2014-05-21 DIAGNOSIS — Z87891 Personal history of nicotine dependence: Secondary | ICD-10-CM | POA: Insufficient documentation

## 2014-05-21 DIAGNOSIS — I6529 Occlusion and stenosis of unspecified carotid artery: Secondary | ICD-10-CM | POA: Insufficient documentation

## 2014-05-21 DIAGNOSIS — R300 Dysuria: Secondary | ICD-10-CM | POA: Diagnosis not present

## 2014-05-21 DIAGNOSIS — I10 Essential (primary) hypertension: Secondary | ICD-10-CM | POA: Diagnosis not present

## 2014-05-21 DIAGNOSIS — I4891 Unspecified atrial fibrillation: Secondary | ICD-10-CM | POA: Diagnosis not present

## 2014-05-21 DIAGNOSIS — Z951 Presence of aortocoronary bypass graft: Secondary | ICD-10-CM | POA: Insufficient documentation

## 2014-05-21 DIAGNOSIS — N138 Other obstructive and reflux uropathy: Secondary | ICD-10-CM | POA: Insufficient documentation

## 2014-05-21 DIAGNOSIS — I2510 Atherosclerotic heart disease of native coronary artery without angina pectoris: Secondary | ICD-10-CM | POA: Diagnosis not present

## 2014-05-21 HISTORY — DX: Presence of aortocoronary bypass graft: Z95.1

## 2014-05-21 HISTORY — DX: Occlusion and stenosis of bilateral carotid arteries: I65.23

## 2014-05-21 HISTORY — DX: Other specified personal risk factors, not elsewhere classified: Z91.89

## 2014-05-21 HISTORY — DX: Male erectile dysfunction, unspecified: N52.9

## 2014-05-21 HISTORY — DX: Personal history of transient ischemic attack (TIA), and cerebral infarction without residual deficits: Z86.73

## 2014-05-21 HISTORY — DX: Crossing vessel and stricture of ureter without hydronephrosis: N13.5

## 2014-05-21 HISTORY — DX: Gastro-esophageal reflux disease without esophagitis: K21.9

## 2014-05-21 HISTORY — PX: CYSTOSCOPY W/ URETERAL STENT PLACEMENT: SHX1429

## 2014-05-21 HISTORY — PX: CYSTOSCOPY WITH URETEROSCOPY: SHX5123

## 2014-05-21 LAB — POCT I-STAT, CHEM 8
BUN: 24 mg/dL — ABNORMAL HIGH (ref 6–23)
CALCIUM ION: 1.3 mmol/L (ref 1.13–1.30)
CHLORIDE: 104 meq/L (ref 96–112)
Creatinine, Ser: 1.4 mg/dL — ABNORMAL HIGH (ref 0.50–1.35)
GLUCOSE: 108 mg/dL — AB (ref 70–99)
HCT: 52 % (ref 39.0–52.0)
Hemoglobin: 17.7 g/dL — ABNORMAL HIGH (ref 13.0–17.0)
Potassium: 4.4 mEq/L (ref 3.7–5.3)
Sodium: 141 mEq/L (ref 137–147)
TCO2: 27 mmol/L (ref 0–100)

## 2014-05-21 SURGERY — CYSTOSCOPY, WITH RETROGRADE PYELOGRAM AND URETERAL STENT INSERTION
Anesthesia: General | Site: Ureter | Laterality: Right

## 2014-05-21 MED ORDER — ACETAMINOPHEN 10 MG/ML IV SOLN
INTRAVENOUS | Status: DC | PRN
  Administered 2014-05-21: 1000 mg via INTRAVENOUS

## 2014-05-21 MED ORDER — DEXAMETHASONE SODIUM PHOSPHATE 4 MG/ML IJ SOLN
INTRAMUSCULAR | Status: DC | PRN
  Administered 2014-05-21: 4 mg via INTRAVENOUS

## 2014-05-21 MED ORDER — CIPROFLOXACIN HCL 500 MG PO TABS: 500.0000 mg | ORAL_TABLET | Freq: Two times a day (BID) | ORAL | Status: DC

## 2014-05-21 MED ORDER — LACTATED RINGERS IV SOLN
INTRAVENOUS | Status: DC
  Administered 2014-05-21 (×2): via INTRAVENOUS
  Filled 2014-05-21: qty 1000

## 2014-05-21 MED ORDER — IOHEXOL 350 MG/ML SOLN
INTRAVENOUS | Status: DC | PRN
  Administered 2014-05-21: 15 mL

## 2014-05-21 MED ORDER — FENTANYL CITRATE 0.05 MG/ML IJ SOLN
INTRAMUSCULAR | Status: DC | PRN
  Administered 2014-05-21: 25 ug via INTRAVENOUS
  Administered 2014-05-21: 50 ug via INTRAVENOUS
  Administered 2014-05-21: 25 ug via INTRAVENOUS

## 2014-05-21 MED ORDER — FENTANYL CITRATE 0.05 MG/ML IJ SOLN
INTRAMUSCULAR | Status: AC
  Filled 2014-05-21: qty 4

## 2014-05-21 MED ORDER — SODIUM CHLORIDE 0.9 % IR SOLN
Status: DC | PRN
  Administered 2014-05-21: 3000 mL

## 2014-05-21 MED ORDER — PROPOFOL 10 MG/ML IV BOLUS
INTRAVENOUS | Status: DC | PRN
  Administered 2014-05-21: 120 mg via INTRAVENOUS

## 2014-05-21 MED ORDER — PHENAZOPYRIDINE HCL 100 MG PO TABS: 100.0000 mg | ORAL_TABLET | Freq: Three times a day (TID) | ORAL | Status: DC | PRN

## 2014-05-21 MED ORDER — LACTATED RINGERS IV SOLN
INTRAVENOUS | Status: DC
  Filled 2014-05-21: qty 1000

## 2014-05-21 MED ORDER — PHENAZOPYRIDINE HCL 100 MG PO TABS
ORAL_TABLET | ORAL | Status: AC
  Filled 2014-05-21: qty 1

## 2014-05-21 MED ORDER — FENTANYL CITRATE 0.05 MG/ML IJ SOLN
25.0000 ug | INTRAMUSCULAR | Status: DC | PRN
  Filled 2014-05-21: qty 1

## 2014-05-21 MED ORDER — CIPROFLOXACIN IN D5W 400 MG/200ML IV SOLN
400.0000 mg | INTRAVENOUS | Status: AC
  Administered 2014-05-21: 400 mg via INTRAVENOUS
  Filled 2014-05-21: qty 200

## 2014-05-21 MED ORDER — STERILE WATER FOR IRRIGATION IR SOLN
Status: DC | PRN
  Administered 2014-05-21: 3000 mL

## 2014-05-21 MED ORDER — PHENAZOPYRIDINE HCL 100 MG PO TABS
100.0000 mg | ORAL_TABLET | Freq: Once | ORAL | Status: AC
  Administered 2014-05-21: 100 mg via ORAL
  Filled 2014-05-21: qty 1

## 2014-05-21 MED ORDER — CIPROFLOXACIN IN D5W 400 MG/200ML IV SOLN
INTRAVENOUS | Status: AC
  Filled 2014-05-21: qty 200

## 2014-05-21 MED ORDER — EPHEDRINE SULFATE 50 MG/ML IJ SOLN
INTRAMUSCULAR | Status: DC | PRN
Start: 2014-05-21 — End: 2014-05-21
  Administered 2014-05-21: 10 mg via INTRAVENOUS

## 2014-05-21 MED ORDER — LIDOCAINE HCL (CARDIAC) 20 MG/ML IV SOLN
INTRAVENOUS | Status: DC | PRN
  Administered 2014-05-21: 60 mg via INTRAVENOUS

## 2014-05-21 SURGICAL SUPPLY — 23 items
ADAPTER CATH URET PLST 4-6FR (CATHETERS) IMPLANT
ADPR CATH URET STRL DISP 4-6FR (CATHETERS)
BAG DRAIN URO-CYSTO SKYTR STRL (DRAIN) ×3 IMPLANT
BAG DRN UROCATH (DRAIN) ×1
BOOTIES KNEE HIGH SLOAN (MISCELLANEOUS) ×3 IMPLANT
CANISTER SUCT LVC 12 LTR MEDI- (MISCELLANEOUS) ×3 IMPLANT
CATH INTERMIT  6FR 70CM (CATHETERS) ×3 IMPLANT
CATH URET DUAL LUMEN 6-10FR 50 (CATHETERS) IMPLANT
CLOTH BEACON ORANGE TIMEOUT ST (SAFETY) ×3 IMPLANT
DRAPE CAMERA CLOSED 9X96 (DRAPES) ×3 IMPLANT
GLOVE BIO SURGEON STRL SZ7.5 (GLOVE) IMPLANT
GLOVE SURG SS PI 7.5 STRL IVOR (GLOVE) ×6 IMPLANT
GOWN PREVENTION PLUS LG XLONG (DISPOSABLE) IMPLANT
GOWN STRL REIN XL XLG (GOWN DISPOSABLE) IMPLANT
GOWN STRL REUS W/TWL XL LVL3 (GOWN DISPOSABLE) ×6 IMPLANT
GUIDEWIRE 0.038 PTFE COATED (WIRE) IMPLANT
GUIDEWIRE ANG ZIPWIRE 038X150 (WIRE) IMPLANT
GUIDEWIRE STR DUAL SENSOR (WIRE) ×3 IMPLANT
IV NS IRRIG 3000ML ARTHROMATIC (IV SOLUTION) ×3 IMPLANT
NS IRRIG 500ML POUR BTL (IV SOLUTION) IMPLANT
PACK CYSTO (CUSTOM PROCEDURE TRAY) ×3 IMPLANT
STENT POLARIS 5FRX24 (STENTS) ×3 IMPLANT
WATER STERILE IRR 3000ML UROMA (IV SOLUTION) ×3 IMPLANT

## 2014-05-21 NOTE — Discharge Instructions (Signed)
1. You may see some blood in the urine and may have some burning with urination for 48-72 hours. You also may notice that you have to urinate more frequently or urgently after your procedure which is normal.  2. You should call should you develop an inability urinate, fever > 101, persistent nausea and vomiting that prevents you from eating or drinking to stay hydrated.  3. You have a stent. You will likely urinate more frequently and urgently until the stent is removed and you may experience some discomfort/pain in the lower abdomen and flank especially when urinating. You may take pain medication prescribed to you if needed for pain. You may also intermittently have blood in the urine until the stent is removed. [facility] [phone] Post Ureteroscopy With or Without Stent Instructions  Definitions:  Ureter: The duct that transports urine from the kidney to the bladder. Stent:   A plastic hollow tube that is placed into the ureter, from the kidney to the                 bladder to prevent the ureter from swelling shut.  GENERAL INSTRUCTIONS:  Despite the fact that no skin incisions were used, the area around the ureter and bladder is raw and irritated. The stent is a foreign body which will further irritate the bladder wall. This irritation is manifested by increased frequency of urination, both day and night, and by an increase in the urge to urinate. In some, the urge to urinate is present almost always. Sometimes the urge is strong enough that you may not be able to stop yourself from urinating. The only real cure is to remove the stent and then give time for the bladder wall to heal which can't be done until the danger of the ureter swelling shut has passed, which varies.  You may see some blood in your urine while the stent is in place and a few days afterwards. Do not be alarmed, even if the urine was clear for a while. Get off your feet and drink lots of fluids until  clearing occurs. If you start to pass clots or don't improve, call us.  DIET: You may return to your normal diet immediately. Because of the raw surface of your bladder, alcohol, spicy foods, acid type foods and drinks with caffeine may cause irritation or frequency and should be used in moderation. To keep your urine flowing freely and to avoid constipation, drink plenty of fluids during the day ( 8-10 glasses ). Tip: Avoid cranberry juice because it is very acidic.  ACTIVITY: Your physical activity doesn't need to be restricted. However, if you are very active, you may see some blood in your urine. We suggest that you reduce your activity under these circumstances until the bleeding has stopped.  BOWELS: It is important to keep your bowels regular during the postoperative period. Straining with bowel movements can cause bleeding. A bowel movement every other day is reasonable. Use a mild laxative if needed, such as Milk of Magnesia 2-3 tablespoons, or 2 Dulcolax tablets. Call if you continue to have problems. If you have been taking narcotics for pain, before, during or after your surgery, you may be constipated. Take a laxative if necessary.   MEDICATION: You should resume your pre-surgery medications unless told not to. In addition you will often be given an antibiotic to prevent infection. These should be taken as prescribed until the bottles are finished unless you are having an unusual reaction to  one of the drugs.  PROBLEMS YOU SHOULD REPORT TO [place]:  Fevers over 100.5 Fahrenheit.  Heavy bleeding, or clots ( See above notes about blood in urine ).  Inability to urinate.  Drug reactions ( hives, rash, nausea, vomiting, diarrhea ).  Severe burning or pain with urination that is not improving.  FOLLOW-UP: You will need a follow-up appointment to monitor your progress. Call for this appointment at the number listed above. Usually the first appointment will be about three to fourteen  days after your surgery.  Post Anesthesia Home Care Instructions  Activity: Get plenty of rest for the remainder of the day. A responsible adult should stay with you for 24 hours following the procedure.  For the next 24 hours, DO NOT: -Drive a car -Advertising copywriter -Drink alcoholic beverages -Take any medication unless instructed by your physician -Make any legal decisions or sign important papers.  Meals: Start with liquid foods such as gelatin or soup. Progress to regular foods as tolerated. Avoid greasy, spicy, heavy foods. If nausea and/or vomiting occur, drink only clear liquids until the nausea and/or vomiting subsides. Call your physician if vomiting continues.  Special Instructions/Symptoms: Your throat may feel dry or sore from the anesthesia or the breathing tube placed in your throat during surgery. If this causes discomfort, gargle with warm salt water. The discomfort should disappear within 24 hours.

## 2014-05-21 NOTE — Interval H&P Note (Signed)
History and Physical Interval Note:  05/21/2014 9:43 AM  Jose [name]  has presented today for surgery, with the diagnosis of Right Ureteral Pelvic Junction Obstruction  The various methods of treatment have been discussed with the patient and family. After consideration of risks, benefits and other options for treatment, the patient has consented to  Procedure(s): CYSTOSCOPY WITH RETROGRADE PYELOGRAM/URETERAL STENT PLACEMENT (Right) as a surgical intervention .  The patient's history has been reviewed, patient examined, no change in status, stable for surgery.  I have reviewed the patient's chart and labs.  Questions were answered to the patient's satisfaction.     Laural Benes, Sheyenne Konz C

## 2014-05-21 NOTE — H&P (Signed)
Reason For Visit Discuss options for UPJ obstruction   Active Problems Problems  1. Benign prostatic hyperplasia with urinary obstruction (600.01,599.69)   Assessed By: Jethro Bolus (Urology); Last Assessed: 24 Mar 2014 2. Gross hematuria (599.71)  History of Present Illness     80 yo Jose [name] [name] returns today to discuss option for hx of chronic UPJ obstruction. He was a former patient of Dr. Valda Lamb, recently had gross hematuria. He states that he has been having gross hematuria x 1 week now associated with dysuria, frequency, incontinence & nocturia. He says that he just finished Levaquin x 45 days for a UTI. He was out of town at the time of onset of a fever, was seen in the ER & was determined to have a UTI. He says that he had an abdominal x-ray that was apparently negative. He had a negative CT, FISH & cystoscopy work up.     Hx of a hydronephrotic Rt kidney, with failed pyeloplasty at the age of 38.     03/24/14 labs: Urine cytology - negative, Urine C&S - negative   Past Medical History Problems  1. History of arthritis (V13.4) 2. History of atrial fibrillation (V12.59) Jose [name]. History of cardiac disorder (V12.50) 4. History of esophageal reflux (V12.79) 5. History of hypercholesterolemia (V12.29) 6. History of hypertension (V12.59) 7. History of transient cerebral ischemia (V12.54)  Surgical History Problems  1. History of Appendectomy 2. History of CABG (CABG) Jose [name]. History of Hernia Repair 4. History of Pyeloplasty  Current Meds 1. AmLODIPine Besylate 5 MG Oral Tablet;  Therapy: (Recorded:28Jul2015) to Recorded 2. Cialis 5 MG Oral Tablet;  Therapy: (Recorded:28Jul2015) to Recorded Jose [name]. Losartan Potassium 100 MG Oral Tablet;  Therapy: (Recorded:28Jul2015) to Recorded 4. Multiple Vitamin TABS;  Therapy: (Recorded:28Jul2015) to Recorded 5. Niaspan 500 MG Oral Tablet Extended Release;  Therapy: (Recorded:28Jul2015) to Recorded 6. Polyethylene Glycol 3350 Oral  Powder;  Therapy: (Recorded:28Jul2015) to Recorded 7. Pravastatin Sodium 80 MG Oral Tablet;  Therapy: (Recorded:28Jul2015) to Recorded 8. PredniSONE 50 MG Oral Tablet; one tablet 13 hrs prior, one tablet 7 hrs  prior & one tablet 1 hr prior to procedure;  Therapy: 29Jul2015 to (Last Rx:29Jul2015)  Requested for: 29Jul2015  Ordered 9. PriLOSEC 20 MG Oral Capsule Delayed Release;  Therapy: (Recorded:28Jul2015) to Recorded 10. Saw Palmetto CAPS;   Therapy: (Recorded:28Jul2015) to Recorded 11. Testosterone Cream 10%;   Therapy: (Recorded:28Jul2015) to Recorded 12. Xarelto 20 MG Oral Tablet;   Therapy: (Recorded:28Jul2015) to Recorded  Allergies Medication  1. ACE Inhibitors 2. Codeine Derivatives Jose [name]. Metoprolol Succinate ER TB24 4. Sulfa Drugs 5. Cephalexin CAPS Non-Medication  6. Contrast Dye  Family History Problems  1. Family history of Deceased : Mother, Father 2. Family history of cardiac disorder (V17.49) : Mother Jose [name]. Family history of gastric ulcer (V18.59) : Brother 4. Family history of hypertension (V17.49) : Mother 5. Family history of kidney stones (V18.69) : Father 6. Family history of prostate cancer (N82.95) : Father 7. Family history of Hematuria, microscopic : Father  Social History Problems  1. Former smoker (V15.82) 2. Former smoker (V15.82)   1 ppd x 64yrs Jose [name]. Married 4. No alcohol use 5. No caffeine use 6. Number of children   1 son, Jose [name] daughters 7. Retired  Review of Systems Genitourinary, constitutional, skin, eye, otolaryngeal, hematologic/lymphatic, cardiovascular, pulmonary, endocrine, musculoskeletal, gastrointestinal, neurological and psychiatric system(s) were reviewed and pertinent findings if present are noted.  Genitourinary: urinary frequency, dysuria, nocturia, incontinence, hematuria and erectile dysfunction.  Constitutional: fever and  feeling tired (fatigue).  Musculoskeletal: back pain and joint pain.    Vitals Vital Signs [Data  Includes: Last 1 Day]  Recorded: 27Aug2015 09:59AM  Blood Pressure: 129 / 68 Temperature: 97.8 F Heart Rate: 61  Physical Exam Constitutional: Well nourished and well developed . No acute distress.  ENT:. The ears and nose are normal in appearance.  Neck: The appearance of the neck is normal and no neck mass is present.  Pulmonary: No respiratory distress and normal respiratory rhythm and effort.  Cardiovascular: Heart rate and rhythm are normal . No peripheral edema.  Abdomen: The abdomen is soft and nontender. No masses are palpated. mild right CVA tenderness. No hernias are palpable. No hepatosplenomegaly noted.  Genitourinary: Examination of the penis demonstrates no discharge, no masses, no lesions and a normal meatus. The scrotum is without lesions. The right epididymis is palpably normal and non-tender. The left epididymis is palpably normal and non-tender. The right testis is non-tender and without masses. The left testis is non-tender and without masses.  Lymphatics: The femoral and inguinal nodes are not enlarged or tender.  Skin: Normal skin turgor, no visible rash and no visible skin lesions.  Neuro/Psych:. Mood and affect are appropriate.    Assessment Assessed  1. 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 Gross hematuria (599.71)  80 yo Jose [name] [name] with hx of congenital UPJ obstruction, post repair, and with R flank fullness, and gross hematuria. He has confusing UPJ studies, with hx of 20 % renal function, and now 50% function. I have discussed the case with Dr. Berneice Heinrich, and note that the patient has scar in the kidney, and questionable 50% function. Therefore, he will have cysto, right retrograde pyelogram, and right Polaris stent placement for 1 month, with re-do of his scan. If his scan looks good, and his symptoms of R flank fullness go away, then he could have repeat R UPJ repair per Dr. Berneice Heinrich.   Plan UPJ obstruction, congenital  1. LASIX RENOGRAM; Status:Hold For -   Appointment,PreCert,Print,Records; Requested for:27Aug2015;  2. Follow-up After Test Office  Follow-up after Lasix Renal scan.  Status: Hold  For - Appointment,Date of Service  Requested for: 27Aug2015  1. cysto, Right retrograde pyelogram and right JJ stent ( Polaris). ( 5'9")   Discussion/Summary cc: Dr. Mosetta Putt     Signatures Electronically signed by : Jethro Bolus, M.D.; Apr 23 2014 10:27AM EST

## 2014-05-21 NOTE — Op Note (Signed)
Preoperative diagnosis: Right hydronephrosis, possible ureteral stricture vs UPJ obstruction  Postoperative diagnosis: Same  Procedure:  1. Cystoscopy 2. Right Polaris ureteral stent placement (5Fr x 24 cm) 3. Right retrograde pyelography with interpretation 4. Right diagnostic ureteroscopy  Surgeon: Dr. Jethro Bolus  Resident: Elon Jester, MD  Anesthesia: General  Complications: None  Intraoperative findings: Right retrograde pyelography demonstrated a J-hook in the proximal ureter. There was dilation proximal to the J-hook. There is dilation of the renal pelvis with poor drainage. No definitive UPJ obstruction.   EBL: Minimal  Indication: Jose [name] is a 80 y.o. male patient with history of UPJ obstruction. He had a prior UPJ repair at age 78 that failed. He has disparate results on his lasix renal scans ranging from <15% to 40%.  After reviewing the management options for treatment, they elected to proceed with the above surgical procedure(s). We have discussed the potential benefits and risks of the procedure, side effects of the proposed treatment, the likelihood of the patient achieving the goals of the procedure, and any potential problems that might occur during the procedure or recuperation. Informed consent has been obtained.  Description of procedure:  The patient was taken to the operating room and general anesthesia was induced.  The patient was placed in the dorsal lithotomy position, prepped and draped in the usual sterile fashion, and preoperative antibiotics were administered. A preoperative time-out was performed.   Cystourethroscopy was performed.  The patient's urethra was examined and was normal. The bladder was then systematically examined in its entirety. There was no evidence for any bladder tumors, stones, or other mucosal pathology.    Attention then turned to the Right ureteral orifice and a ureteral catheter was used to intubate the ureteral  orifice.  Omnipaque contrast was injected through the ureteral catheter and a retrograde pyelogram was performed with findings as dictated above.  A 0.38 sensor guidewire was then advanced up the Right ureter into the renal pelvis under fluoroscopic guidance.The J-hook straightened out. The rigid ureteroscope was passed proximally up the ureter. The ureter was of normal caliber until the area of the J-hook at which point the tortuosity of the ureter would not allow the scope to pass.   The guidewire was backloaded through the cystoscope and a ureteral stent was advance over the wire using Seldinger technique.  The 5Fr x 24cm Polaris stent was positioned appropriately under fluoroscopic and cystoscopic guidance.  The wire was then removed with an adequate stent curl noted in the renal pelvis as well as in the bladder.  The bladder was then emptied and the procedure ended.  The patient appeared to tolerate the procedure well and without complications.  The patient was able to be awakened and transferred to the recovery unit in satisfactory condition.

## 2014-05-21 NOTE — Anesthesia Postprocedure Evaluation (Signed)
  Anesthesia Post-op Note  Patient: Jose [name]  Procedure(s) Performed: Procedure(s) (LRB): CYSTOSCOPY WITH RETROGRADE PYELOGRAM/URETERAL STENT PLACEMENT (Right) CYSTOSCOPY WITH URETEROSCOPY (Right)  Patient Location: PACU  Anesthesia Type: General  Level of Consciousness: awake and alert   Airway and Oxygen Therapy: Patient Spontanous Breathing  Post-op Pain: mild  Post-op Assessment: Post-op Vital signs reviewed, Patient's Cardiovascular Status Stable, Respiratory Function Stable, Patent Airway and No signs of Nausea or vomiting  Last Vitals:  Filed Vitals:   05/21/14 1115  BP: 131/64  Pulse: 68  Temp:   Resp: 16    Post-op Vital Signs: stable   Complications: No apparent anesthesia complications

## 2014-05-21 NOTE — Anesthesia Preprocedure Evaluation (Addendum)
Anesthesia Evaluation  Patient identified by MRN, date of birth, ID band Patient awake    Reviewed: Allergy & Precautions, H&P , NPO status , Patient's Chart, lab work & pertinent test results  Airway Mallampati: II TM Distance: >3 FB Neck ROM: full    Dental  (+) Edentulous Upper, Edentulous Lower, Dental Advisory Given   Pulmonary neg pulmonary ROS, former smoker,  Stop bang 5 breath sounds clear to auscultation  Pulmonary exam normal       Cardiovascular hypertension, Pt. on medications + CAD and + CABG + dysrhythmias Atrial Fibrillation Rhythm:regular Rate:Normal  ECG - NSR   Neuro/Psych Bilateral internal carotid stenosis - mild CVA, No Residual Symptoms negative neurological ROS  negative psych ROS   GI/Hepatic negative GI ROS, Neg liver ROS, GERD-  Medicated and Controlled,  Endo/Other  negative endocrine ROS  Renal/GU negative Renal ROS  negative genitourinary   Musculoskeletal   Abdominal   Peds  Hematology negative hematology ROS (+)   Anesthesia Other Findings   Reproductive/Obstetrics negative OB ROS                          Anesthesia Physical Anesthesia Plan  ASA: III  Anesthesia Plan: General   Post-op Pain Management:    Induction: Intravenous  Airway Management Planned: LMA  Additional Equipment:   Intra-op Plan:   Post-operative Plan:   Informed Consent: I have reviewed the patients History and Physical, chart, labs and discussed the procedure including the risks, benefits and alternatives for the proposed anesthesia with the patient or authorized representative who has indicated his/her understanding and acceptance.   Dental Advisory Given  Plan Discussed with: CRNA and Surgeon  Anesthesia Plan Comments:         Anesthesia Quick Evaluation

## 2014-05-21 NOTE — Anesthesia Procedure Notes (Signed)
Procedure Name: LMA Insertion Date/Time: 05/21/2014 10:10 AM Performed by: Norva Pavlov Pre-anesthesia Checklist: Patient identified, Emergency Drugs available, Suction available and Patient being monitored Patient Re-evaluated:Patient Re-evaluated prior to inductionOxygen Delivery Method: Circle System Utilized Preoxygenation: Pre-oxygenation with 100% oxygen Intubation Type: IV induction Ventilation: Mask ventilation without difficulty LMA: LMA inserted LMA Size: 4.0 Number of attempts: 1 Airway Equipment and Method: bite block Placement Confirmation: positive ETCO2 Tube secured with: Tape Dental Injury: Teeth and Oropharynx as per pre-operative assessment

## 2014-05-21 NOTE — Transfer of Care (Signed)
Immediate Anesthesia Transfer of Care Note  Patient: Jose [name]  Procedure(s) Performed: Procedure(s) (LRB): CYSTOSCOPY WITH RETROGRADE PYELOGRAM/URETERAL STENT PLACEMENT (Right) CYSTOSCOPY WITH URETEROSCOPY (Right)  Patient Location: PACU  Anesthesia Type: General  Level of Consciousness: awake, alert  and oriented  Airway & Oxygen Therapy: Patient Spontanous Breathing and Patient connected to face mask oxygen  Post-op Assessment: Report given to PACU RN and Post -op Vital signs reviewed and stable  Post vital signs: Reviewed and stable  Complications: No apparent anesthesia complications

## 2014-05-22 ENCOUNTER — Telehealth: Payer: Self-pay | Admitting: Cardiology

## 2014-05-22 ENCOUNTER — Encounter (HOSPITAL_BASED_OUTPATIENT_CLINIC_OR_DEPARTMENT_OTHER): Payer: Self-pay | Admitting: Urology

## 2014-05-22 MED ORDER — RIVAROXABAN 15 MG PO TABS: 15.0000 mg | ORAL_TABLET | Freq: Every day | ORAL | Status: DC

## 2014-05-22 NOTE — Telephone Encounter (Signed)
xarelto should be 15 mg daily as most recent GFR < 50. Jose [name]

## 2014-05-22 NOTE — Telephone Encounter (Signed)
SPOKE TO PATIENT INSTRUCTION GIVEN.  START XARELTO 15 MG DAILY NOW E- SENT NEW RX  #90 DAY SUPPLY PATIENT AWARE

## 2014-05-22 NOTE — Telephone Encounter (Signed)
Pt had surgery yesterday and he wants to talk to the nurse about his Xarelto.

## 2014-05-22 NOTE — Telephone Encounter (Signed)
Patient called he had a question concerning discharge instruction of XARELTO from procedure yesterday. Patient states instruction stated XARELTO 15 MG twice a day. He did not think that seem right.  RN reviewed patient chart - lab result from hepatic level 04/22/14- states Xarelto 15 mg daily. Patient states he has about 2 months worth of Xarelto 20 mg left, he wanted to know if he needs complete this or  Start with Xarelto 15 mg daily now.  Patient aware ,will defer to Dr Jens Som- and contact him back

## 2014-05-29 NOTE — Op Note (Signed)
Urologic Attending Note: Primary Surgeon for procedure, pt will need follow-up evaluation for renal function, and possibly 2nd look at the UPJ anastomosis with digital flexible scope.

## 2014-06-22 ENCOUNTER — Ambulatory Visit (HOSPITAL_COMMUNITY)
Admission: RE | Admit: 2014-06-22 | Discharge: 2014-06-22 | Disposition: A | Discharge status: Home / Self Care | Payer: Medicare Other | Source: Ambulatory Visit | Attending: Urology | Admitting: Urology

## 2014-06-22 DIAGNOSIS — R319 Hematuria, unspecified: Secondary | ICD-10-CM | POA: Insufficient documentation

## 2014-06-22 DIAGNOSIS — Q6239 Other obstructive defects of renal pelvis and ureter: Secondary | ICD-10-CM

## 2014-06-22 DIAGNOSIS — N131 Hydronephrosis with ureteral stricture, not elsewhere classified: Secondary | ICD-10-CM | POA: Diagnosis present

## 2014-06-22 DIAGNOSIS — Q6211 Congenital occlusion of ureteropelvic junction: Secondary | ICD-10-CM

## 2014-06-22 MED ORDER — TECHNETIUM TC 99M MERTIATIDE
15.0000 | Freq: Once | INTRAVENOUS | Status: AC | PRN
  Administered 2014-06-22: 15 via INTRAVENOUS

## 2014-06-22 MED ORDER — FUROSEMIDE 10 MG/ML IJ SOLN
39.0000 mg | Freq: Once | INTRAMUSCULAR | Status: AC
  Administered 2014-06-22: 39 mg via INTRAVENOUS
  Filled 2014-06-22: qty 4

## 2014-07-06 ENCOUNTER — Ambulatory Visit (INDEPENDENT_AMBULATORY_CARE_PROVIDER_SITE_OTHER): Payer: Medicare Other | Admitting: Nurse Practitioner

## 2014-07-06 ENCOUNTER — Encounter: Payer: Self-pay | Admitting: Nurse Practitioner

## 2014-07-06 VITALS — BP 131/64 | HR 60 | Wt 183.0 lb

## 2014-07-06 DIAGNOSIS — I2510 Atherosclerotic heart disease of native coronary artery without angina pectoris: Secondary | ICD-10-CM

## 2014-07-06 DIAGNOSIS — I480 Paroxysmal atrial fibrillation: Secondary | ICD-10-CM

## 2014-07-06 DIAGNOSIS — I639 Cerebral infarction, unspecified: Secondary | ICD-10-CM

## 2014-07-06 DIAGNOSIS — I63219 Cerebral infarction due to unspecified occlusion or stenosis of unspecified vertebral arteries: Secondary | ICD-10-CM

## 2014-07-06 NOTE — Progress Notes (Signed)
I agree with the above plan 

## 2014-07-06 NOTE — Progress Notes (Signed)
PATIENT: Jose [name] DOB: Dec 18, 1930  REASON FOR VISIT: routine follow up for stroke HISTORY FROM: patient  HISTORY OF PRESENT ILLNESS: UPDATE 07/06/14 (LL): [name] returns for stroke followup. Last visit was 6 months ago with me. Since last visit, he has not had any recurrent neurological symptoms. Blood pressure is well controlled, it is 131/64 in the office today. He is tolerating Xarelto well with no signs of significant bleeding or bruising. His dose of Xarelto was reduced recently due to lowered kidney function. 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 Differential renal function was found to be approximately 58% left and 42% right. He continues to play golf as much as weather permits. His last lipid panel in August shows total cholesterol of 118, LDL of 57. He has no complaints today.  UPDATE 01/02/14 (LL): Patient returns for stroke revisit.  He states his blood pressure is well controlled, it is 128/70 in our office today.  He is tolerating Xarelto well with no signs of significant bleeding or bruising.  His peripheral vision has corrected and he has no further problems with his vision except redness and itching from Spring allergies.  He continues to play golf 3 x week and feels well.  Update 09/17/13 (PS): He returns for followup after last visit 2 months ago. He was found to have paroxysmal atrial fibrillation on life watch monitor with controlled heart rate, CHADs2VASC score was 6 and I prescribed Eliquis but has seen Dr. Johney FrameAllred cardiologist on 09/10/13 who has started him on Xarelto instead do to cost reasons. He seems to be tolerating it well without significant bleeding and only minor bruising. He had lab work done on 07/10/13 which showed normal lipid profile and hemoglobin A1c was borderline at 6.2. He continues to have mild residual left-sided peripheral vision difficulties but has been able  to drive okay and has not had any accidents or near misses. He has noticed for the last several years that he has a little bit of fullness and swelling in his right groin and has not yet sought any medical evaluation for this. He denies significant pain but does notice some fullness in the groin.   Initial Consult 07/09/2013 ;[age] year Caucasian male who developed sudden onset of left-sided peripheral vision difficulties on 07/02/13. He noted that he was in black spots on the left side of field of vision and difficulty reading sentences as he could read only parts of the words towards the right and was missing portions on the left. He also developed mild bifrontal headaches which he thought was related to sinuses. The headache was mild and intermittent and did not bother him. Denied any double vision, vertigo, gait, balance difficulties, memory loss focal weakness or numbness. He has no prior history of strokes. He does have vascular factors of hypertension since age 80 as well as coronary artery disease and underwent CABG 7 years ago. He also has hyperlipidemia for which she takes protocol which and lipids were last checked in May and was fine. He had MRI scan of the brain done on 07/07/13 which I personally reviewed and shows a right occipital as well as right frontal MCA branch acute infarcts. MRA of the brain shows no large vessel intracranial stenosis. MRA of the neck showed no extracranial stenosis either. He has a 2-D echocardiogram planned for tomorrow. He was previously on aspirin and Plavix was added by  Dr. Lorenz Coaster. He states that his peripheral vision symptoms improved fairly quickly he still has some difficulty while reading and on the extreme left peripheral visual field.   REVIEW OF SYSTEMS: Full 14 system review of systems performed and notable only for:   No complaints  ALLERGIES: Allergies  Allergen Reactions  . Iohexol Anaphylaxis     THROAT SWELLS AND STOPS BREATHING   . Codeine Other  (See Comments)    headaches  . Sulfa Antibiotics Rash    HOME MEDICATIONS: Outpatient Prescriptions Prior to Visit  Medication Sig Dispense Refill  . amLODipine (NORVASC) 5 MG tablet Take 5 mg by mouth every morning.     . cholecalciferol (VITAMIN D) 1000 UNITS tablet Take 1,000 Units by mouth daily.    Marland Kitchen losartan (COZAAR) 100 MG tablet Take 100 mg by mouth every morning.     . Multiple Vitamin (MULTIVITAMIN) capsule Take 1 capsule by mouth daily.      . niacin (NIASPAN) 500 MG CR tablet Take 500 mg by mouth every evening.     Marland Kitchen omeprazole (PRILOSEC) 20 MG capsule Take 20 mg by mouth every morning.     . phenazopyridine (PYRIDIUM) 100 MG tablet Take 1 tablet (100 mg total) by mouth 3 (three) times daily as needed for pain. 10 tablet 0  . polyethylene glycol (MIRALAX / GLYCOLAX) packet Take 17 g by mouth daily.    . pravastatin (PRAVACHOL) 80 MG tablet Take 80 mg by mouth every evening.     . Rivaroxaban (XARELTO) 15 MG TABS tablet Take 1 tablet (15 mg total) by mouth daily with supper. 90 tablet 3  . saw palmetto 160 MG capsule Take 160 mg by mouth 2 (two) times daily.      . tadalafil (CIALIS) 5 MG tablet Take 5 mg by mouth every evening.     . Testosterone 10 MG/ACT (2%) GEL Place onto the skin daily.     . ciprofloxacin (CIPRO) 500 MG tablet Take 1 tablet (500 mg total) by mouth 2 (two) times daily. 6 tablet 0   No facility-administered medications prior to visit.    PHYSICAL EXAM Filed Vitals:   07/06/14 1429  BP: 131/64  Pulse: 60  Weight: 183 lb (83.008 kg)   Body mass index is 26.26 kg/(m^2).  General: well developed, well nourished, seated, in no evident distress   Head: head normocephalic and atraumatic. Orohparynx benign   Neck: supple with no carotid or supraclavicular bruits   Cardiovascular: regular rate and rhythm, no murmurs   Musculoskeletal: no deformity   Skin: no rash/petichiae   Vascular: Normal pulses all extremities   Neurologic Exam   Mental Status:  Awake and fully alert. Oriented to place and time. Recent and remote memory intact. Attention span, concentration and fund of knowledge appropriate. Mood and affect appropriate.   Cranial Nerves: Pupils equal, briskly reactive to light. Extraocular movements full without nystagmus. Visual fields almostfull to confrontation with only mild extreme left peripheral temporal field defect to bedside confrontational testing. Hearing intact. Facial sensation intact. Face, tongue, palate moves normally and symmetrically.   Motor: Normal bulk and tone. Normal strength in all tested extremity muscles.   Sensory: intact to touch and pinprick and vibratory.   Coordination: Rapid alternating movements normal in all extremities. Finger-to-nose and heel-to-shin performed accurately bilaterally.   Gait and Station: Arises from chair without difficulty. Stance is normal. Gait demonstrates normal stride length and balance . Able to heel, toe and tandem walk without difficulty.  Reflexes: 1+ and symmetric.   DIAGNOSTIC DATA (LABS, IMAGING, TESTING) - I reviewed patient records, labs, notes, testing and imaging myself where available.  Lab Results  Component Value Date   WBC 6.3 04/08/2014   HGB 17.7* 05/21/2014   HCT 52.0 05/21/2014   MCV 91.8 04/08/2014   PLT 152.0 04/08/2014      Component Value Date/Time   NA 141 05/21/2014 0909   K 4.4 05/21/2014 0909   CL 104 05/21/2014 0909   CO2 27 04/22/2014 0902   GLUCOSE 108* 05/21/2014 0909   BUN 24* 05/21/2014 0909   CREATININE 1.40* 05/21/2014 0909   CREATININE 1.38* 04/22/2014 0902   CALCIUM 9.5 04/22/2014 0902   PROT 7.3 04/22/2014 0902   ALBUMIN 4.2 04/22/2014 0902   AST 15 04/22/2014 0902   ALT 12 04/22/2014 0902   ALKPHOS 92 04/22/2014 0902   BILITOT 0.5 04/22/2014 0902   GFRNONAA 48* 04/22/2014 0902   GFRAA 55* 04/22/2014 0902   Lab Results  Component Value Date   CHOL 118 04/22/2014   HDL 45 04/22/2014   LDLCALC 57 04/22/2014   TRIG 79  04/22/2014   CHOLHDL 2.6 04/22/2014    ASSESSMENT: 80 y.o. year old male  has a past medical history of Coronary artery disease; Chest pain; Hypertension; Other malaise and fatigue; Stroke; and Paroxysmal atrial fibrillation here with transient left-sided vision difficulties do to embolic right right PCA and MCA branch infarcts in November 2014, now on Xarelto.  Doing well.  PLAN:  Continue Xarelto for atrial fibrillation and secondary stroke prevention. Maintain strict control of hypertension with blood pressure goal below 130/90, diabetes with hemoglobin A1c goal below 6.5% and lipids with LDL cholesterol goal below 100 mg/dL.   Followup in 6 months with Dr. Pearlean BrownieSethi, sooner as needed.  Tawny AsalLYNN E. LAM, MSN, FNP-BC, A/GNP-C 07/06/2014, 2:33 PM [facility] [address], [address] [place], [place] [id number] [phone]  Note: This document was prepared with digital dictation and possible smart phrase technology. Any transcriptional errors that result from this process are unintentional.

## 2014-07-06 NOTE — Patient Instructions (Addendum)
Continue Xarelto  for secondary stroke prevention and maintain strict control of hypertension with blood pressure goal below 130/90, diabetes with hemoglobin A1c goal below 6.5% and lipids with LDL cholesterol goal below 100 mg/dL.   Followup in 6 months with Dr. SePearlean Browniethi, sooner as needed.   Stroke Prevention Some medical conditions and behaviors are associated with an increased chance of having a stroke. You may prevent a stroke by making healthy choices and managing medical conditions. HOW CAN I REDUCE MY RISK OF HAVING A STROKE?   Stay physically active. Get at least 30 minutes of activity on most or all days.  Do not smoke. It may also be helpful to avoid exposure to secondhand smoke.  Limit alcohol use. Moderate alcohol use is considered to be:  No more than 2 drinks per day for men.  No more than 1 drink per day for nonpregnant women.  Eat healthy foods. This involves:  Eating 5 or more servings of fruits and vegetables a day.  Making dietary changes that address high blood pressure (hypertension), high cholesterol, diabetes, or obesity.  Manage your cholesterol levels.  Making food choices that are high in fiber and low in saturated fat, trans fat, and cholesterol may control cholesterol levels.  Take any prescribed medicines to control cholesterol as directed by your health care provider.  Manage your diabetes.  Controlling your carbohydrate and sugar intake is recommended to manage diabetes.  Take any prescribed medicines to control diabetes as directed by your health care provider.  Control your hypertension.  Making food choices that are low in salt (sodium), saturated fat, trans fat, and cholesterol is recommended to manage hypertension.  Take any prescribed medicines to control hypertension as directed by your health care provider.  Maintain a healthy weight.  Reducing calorie intake and making food choices that are low in sodium, saturated fat, trans fat, and  cholesterol are recommended to manage weight.  Stop drug abuse.  Avoid taking birth control pills.  Talk to your health care provider about the risks of taking birth control pills if you are over 78 years old, smoke, get migraines, or have ever had a blood clot.  Get evaluated for sleep disorders (sleep apnea).  Talk to your health care provider about getting a sleep evaluation if you snore a lot or have excessive sleepiness.  Take medicines only as directed by your health care provider.  For some people, aspirin or blood thinners (anticoagulants) are helpful in reducing the risk of forming abnormal blood clots that can lead to stroke. If you have the irregular heart rhythm of atrial fibrillation, you should be on a blood thinner unless there is a good reason you cannot take them.  Understand all your medicine instructions.  Make sure that other conditions (such as anemia or atherosclerosis) are addressed. SEEK IMMEDIATE MEDICAL CARE IF:   You have sudden weakness or numbness of the face, arm, or leg, especially on one side of the body.  Your face or eyelid droops to one side.  You have sudden confusion.  You have trouble speaking (aphasia) or understanding.  You have sudden trouble seeing in one or both eyes.  You have sudden trouble walking.  You have dizziness.  You have a loss of balance or coordination.  You have a sudden, severe headache with no known cause.  You have new chest pain or an irregular heartbeat. Any of these symptoms may represent a serious problem that is an emergency. Do not wait to see if  the symptoms will go away. Get medical help at once. Call your local emergency services (911 in [place] Do not drive yourself to the hospital. Document Released: 09/21/2004 Document Revised: 12/29/2013 Document Reviewed: 02/14/2013 [facility] Patient Information 2015 [employer] [place]. This information is not intended to replace advice given to you by your health care  provider. Make sure you discuss any questions you have with your health care provider.

## 2014-10-09 ENCOUNTER — Ambulatory Visit (INDEPENDENT_AMBULATORY_CARE_PROVIDER_SITE_OTHER): Payer: Medicare Other | Admitting: *Deleted

## 2014-10-09 DIAGNOSIS — I4891 Unspecified atrial fibrillation: Secondary | ICD-10-CM

## 2014-10-09 LAB — BASIC METABOLIC PANEL
BUN: 26 mg/dL — ABNORMAL HIGH (ref 6–23)
CO2: 30 mEq/L (ref 19–32)
Calcium: 9.3 mg/dL (ref 8.4–10.5)
Chloride: 104 mEq/L (ref 96–112)
Creatinine, Ser: 1.65 mg/dL — ABNORMAL HIGH (ref 0.40–1.50)
GFR: 42.72 mL/min — AB (ref >60.00)
Glucose, Bld: 85 mg/dL (ref 70–99)
POTASSIUM: 4.1 meq/L (ref 3.5–5.1)
SODIUM: 137 meq/L (ref 135–145)

## 2014-10-09 LAB — CBC
HCT: 40.6 % (ref 39.0–52.0)
HEMOGLOBIN: 13.8 g/dL (ref 13.0–17.0)
MCHC: 34 g/dL (ref 30.0–36.0)
MCV: 88.2 fl (ref 78.0–100.0)
PLATELETS: 146 10*3/uL — AB (ref 150.0–400.0)
RBC: 4.6 Mil/uL (ref 4.22–5.81)
RDW: 14.6 % (ref 11.5–15.5)
WBC: 6.4 10*3/uL (ref 4.0–10.5)

## 2014-10-09 MED ORDER — RIVAROXABAN 15 MG PO TABS: 15.0000 mg | ORAL_TABLET | Freq: Every day | ORAL | Status: DC

## 2014-10-09 NOTE — Progress Notes (Signed)
Pt was started on Xarelto for AFIB on September 10, 2013. He is currently taking Xarelto 15mg  daily.    Reviewed patients medication list.  Pt is not currently on any combined P-gp and strong CYP3A4 inhibitors/inducers (ketoconazole, traconazole, ritonavir, carbamazepine, phenytoin, rifampin, St. John's wort).  Reviewed labs.  SCr 1.65, Weight 83.1 kg, CrCl- 41.27, Hgb 13.8 and HCT 40.6.    Dose appropriate based on dosing criteria.     A full discussion of the nature of anticoagulants has been carried out.  A benefit/risk analysis has been presented to the patient, so that they understand the justification for choosing anticoagulation with Xarelto at this time.  The need for compliance is stressed.  Pt is aware to take the medication once daily with the largest meal of the day.  Side effects of potential bleeding are discussed, including unusual colored urine or stools, coughing up blood or coffee ground emesis, nose bleeds or serious fall or head trauma.  Discussed signs and symptoms of stroke. The patient should avoid any OTC items containing aspirin or ibuprofen.  Avoid alcohol consumption.   Call if any signs of abnormal bleeding.  Discussed financial obligations and resolved any difficulty in obtaining medication.  Next lab test test in 6 months.

## 2014-11-11 ENCOUNTER — Ambulatory Visit (INDEPENDENT_AMBULATORY_CARE_PROVIDER_SITE_OTHER): Payer: Medicare Other | Admitting: Cardiology

## 2014-11-11 ENCOUNTER — Encounter: Payer: Self-pay | Admitting: Cardiology

## 2014-11-11 VITALS — BP 120/68 | HR 56 | Ht 70.0 in | Wt 182.1 lb

## 2014-11-11 DIAGNOSIS — I10 Essential (primary) hypertension: Secondary | ICD-10-CM | POA: Diagnosis not present

## 2014-11-11 DIAGNOSIS — I2510 Atherosclerotic heart disease of native coronary artery without angina pectoris: Secondary | ICD-10-CM

## 2014-11-11 DIAGNOSIS — I480 Paroxysmal atrial fibrillation: Secondary | ICD-10-CM

## 2014-11-11 NOTE — Assessment & Plan Note (Signed)
Patient remains in sinus rhythm.continue xarelto.

## 2014-11-11 NOTE — Assessment & Plan Note (Signed)
Continue statin.not on aspirin given need for anticoagulation. 

## 2014-11-11 NOTE — Progress Notes (Signed)
HPI: FU CAD and atrial fibrillation. Patient underwent coronary artery bypass and graft in 2007. He had a LIMA to the LAD, saphenous vein graft to first diagonal, sequential saphenous vein graft to the first marginal and second marginal, sequential saphenous vein graft to the PDA and posterior lateral. He also had closure of PFO. Abdominal ultrasound May 2014 showed no aneurysm. Had CVA in November of 2014. Echocardiogram November 2014 showed an ejection fraction of 45-50%, grade 1 diastolic dysfunction and mild left atrial enlargement. Transesophageal echocardiogram November 2014 showed normal LV function, mild prolapse of the posterior mitral valve leaflet and an atrial septal aneurysm with negative bubble study. Carotid Dopplers November 2014 showed less than 40% bilateral stenosis. 30 day event monitor showed atrial fibrillation. Nuclear study in September 2015 showed an ejection fraction of 53%. Inferior basal scar but no ischemia. Since last seen, he denies dyspnea, chest pain, palpitations or syncope.  Current Outpatient Prescriptions  Medication Sig Dispense Refill  . amLODipine (NORVASC) 5 MG tablet Take 5 mg by mouth every morning.     Marland Kitchen azithromycin (AZASITE) 1 % ophthalmic solution Place 1 drop into both eyes at bedtime.    . cholecalciferol (VITAMIN D) 1000 UNITS tablet Take 1,000 Units by mouth daily.    Marland Kitchen desonide (DESOWEN) 0.05 % cream Apply 1 application topically 2 (two) times a week.   0  . doxycycline (VIBRA-TABS) 100 MG tablet Take 100 mg by mouth 2 (two) times daily.   0  . losartan (COZAAR) 100 MG tablet Take 100 mg by mouth every morning.     . Multiple Vitamin (MULTIVITAMIN) capsule Take 1 capsule by mouth daily.      . niacin (NIASPAN) 500 MG CR tablet Take 500 mg by mouth every evening.     Marland Kitchen omeprazole (PRILOSEC) 20 MG capsule Take 20 mg by mouth every morning.     . polyethylene glycol (MIRALAX / GLYCOLAX) packet Take 17 g by mouth daily.    . pravastatin  (PRAVACHOL) 80 MG tablet Take 80 mg by mouth every evening.     . Rivaroxaban (XARELTO) 15 MG TABS tablet Take 1 tablet (15 mg total) by mouth daily with supper. 90 tablet 3  . saw palmetto 160 MG capsule Take 160 mg by mouth 2 (two) times daily.      . tadalafil (CIALIS) 5 MG tablet Take 5 mg by mouth every evening.     . Testosterone 10 MG/ACT (2%) GEL Place onto the skin daily.      No current facility-administered medications for this visit.     Past Medical History  Diagnosis Date  . Hypertension   . Other malaise and fatigue   . Paroxysmal atrial fibrillation   . History of embolic stroke no residual    11/ 2014  --  right PCA and MCA branch infarts (left side vision difficulties)  . Bilateral carotid artery stenosis     mild bilateral proximcal ICA  40% per duplex 11/ 2014  . S/P CABG x 6     2007  . GERD (gastroesophageal reflux disease)   . Ureteral obstruction, right   . At risk for sleep apnea     STOP-BANG= 5   SENT TO PCP 05-20-2014  . ED (erectile dysfunction)   . Coronary artery disease CARDIOLOGIST-  DR  Jens Som    s/p cabg 2007    Past Surgical History  Procedure Laterality Date  . Tee without cardioversion N/A 07/17/2013  Procedure: TRANSESOPHAGEAL ECHOCARDIOGRAM (TEE);  Surgeon: Wendall StadePeter C Nishan, MD;  [facility];  Service: Cardiovascular;  Laterality: N/A;  normal LV size, mild prolapse of posterior mitral valve leaflet,  mild MR and TR, normal AV,  no LAA thrombus,  atrial septal aneurysm with no PFO/ ASD and negative bubble study, normal RV, normal aorta with no debris  . Laparoscopic cholecystectomy  10-02-2000  . Coronary artery bypass graft  11-01-2005  dr Cornelius Morasowen    CLOSURE OF PFO/  LIMA to LAD,  SVG to D1,  SVG to 1st & 2nd  MARGINAL branches, SVG  to PDA and posterior lateral  . Inguinal hernia repair Bilateral   . Cardiac catheterization  10-31-2005  dr wall    severe three vessel/  perserved LV  . Cardiovascular stress test  last one  05-19-2014  dr Jens Somcrenshaw    low risk lexiscan no exercise study/ small inferobasal infarct with no ischemia /  ef 53%  . Appendectomy  1950's  . Right ureter obstruction surgery  age 81  . Cystoscopy w/ ureteral stent placement Right 05/21/2014    Procedure: CYSTOSCOPY WITH RETROGRADE PYELOGRAM/URETERAL STENT PLACEMENT;  Surgeon: Kathi LudwigSigmund I Tannenbaum, MD;  [facility];  Service: Urology;  Laterality: Right;  . Cystoscopy with ureteroscopy Right 05/21/2014    Procedure: CYSTOSCOPY WITH URETEROSCOPY;  Surgeon: Kathi LudwigSigmund I Tannenbaum, MD;  [facility];  Service: Urology;  Laterality: Right;    History   Social History  . Marital Status: Married    Spouse Name: maxine  . Number of Children: 4  . Years of Education: college4   Occupational History  . retired    Social History Main Topics  . Smoking status: Former Smoker -- 1.00 packs/day for 35 years    Types: Cigarettes    Quit date: 08/29/1977  . Smokeless tobacco: Never Used  . Alcohol Use: No  . Drug Use: No  . Sexual Activity: Not on file   Other Topics Concern  . Not on file   Social History Narrative   Patient is married with 4 children.   Patient is right handed.   Patient has college education.   Patient drinks decaff coffee.    ROS: no fevers or chills, productive cough, hemoptysis, dysphasia, odynophagia, melena, hematochezia, dysuria, hematuria, rash, seizure activity, orthopnea, PND, pedal edema, claudication. Remaining systems are negative.  Physical Exam: Well-developed well-nourished in no acute distress.  Skin is warm and dry.  HEENT is normal.  Neck is supple.  Chest is clear to auscultation with normal expansion.  Cardiovascular exam is regular rate and rhythm.  Abdominal exam nontender or distended. No masses palpated. Extremities show no edema. neuro grossly intact  ECG sinus bradycardia at a rate of 56. First degree AV block.

## 2014-11-11 NOTE — Assessment & Plan Note (Signed)
Blood pressure controlled. Continue present medications. 

## 2014-11-11 NOTE — Assessment & Plan Note (Signed)
Continue statin. 

## 2014-11-11 NOTE — Patient Instructions (Signed)
Your physician wants you to follow-up in: 6 MONTHS WITH DR CRENSHAW You will receive a reminder letter in the mail two months in advance. If you don't receive a letter, please call our office to schedule the follow-up appointment.  

## 2014-12-11 ENCOUNTER — Other Ambulatory Visit (HOSPITAL_COMMUNITY): Payer: Self-pay | Admitting: Urology

## 2014-12-11 DIAGNOSIS — Q6239 Other obstructive defects of renal pelvis and ureter: Secondary | ICD-10-CM

## 2014-12-11 DIAGNOSIS — Q6211 Congenital occlusion of ureteropelvic junction: Principal | ICD-10-CM

## 2014-12-27 DIAGNOSIS — H44009 Unspecified purulent endophthalmitis, unspecified eye: Secondary | ICD-10-CM

## 2014-12-27 HISTORY — DX: Unspecified purulent endophthalmitis, unspecified eye: H44.009

## 2015-01-04 ENCOUNTER — Ambulatory Visit (INDEPENDENT_AMBULATORY_CARE_PROVIDER_SITE_OTHER): Payer: Medicare Other | Admitting: Neurology

## 2015-01-04 ENCOUNTER — Encounter: Payer: Self-pay | Admitting: Neurology

## 2015-01-04 VITALS — BP 107/60 | HR 57 | Wt 178.8 lb

## 2015-01-04 DIAGNOSIS — I6529 Occlusion and stenosis of unspecified carotid artery: Secondary | ICD-10-CM | POA: Diagnosis not present

## 2015-01-04 NOTE — Patient Instructions (Signed)
I had a long d/w patient about his remote stroke, risk for recurrent stroke/TIAs, personally independently reviewed imaging studies and stroke evaluation results and answered questions.Continue Xarelto for atrial fibrillation  for secondary stroke prevention and maintain strict control of hypertension with blood pressure goal below 130/90, diabetes with hemoglobin A1c goal below 6.5% and lipids with LDL cholesterol goal below 100 mg/dL. I advised him to have his fasting lipid profile checked at his upcoming annual physical visit with his primary physician. Check follow-up carotid ultrasound study.  Followup in the future with me in one year or call earlier if necessary.

## 2015-01-04 NOTE — Progress Notes (Signed)
PATIENT: Jose BergamoHenry V [name] DOB: Dec 18, 1930  REASON FOR VISIT: routine follow up for stroke HISTORY FROM: patient  HISTORY OF PRESENT ILLNESS: UPDATE 07/06/14 (LL): [name] returns for stroke followup. Last visit was 6 months ago with me. Since last visit, he has not had any recurrent neurological symptoms. Blood pressure is well controlled, it is 131/64 in the office today. He is tolerating Xarelto well with no signs of significant bleeding or bruising. His dose of Xarelto was reduced recently due to lowered kidney function. 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 Differential renal function was found to be approximately 58% left and 42% right. He continues to play golf as much as weather permits. His last lipid panel in August shows total cholesterol of 118, LDL of 57. He has no complaints today.  UPDATE 01/02/14 (LL): Patient returns for stroke revisit.  He states his blood pressure is well controlled, it is 128/70 in our office today.  He is tolerating Xarelto well with no signs of significant bleeding or bruising.  His peripheral vision has corrected and he has no further problems with his vision except redness and itching from Spring allergies.  He continues to play golf 3 x week and feels well.  Update 09/17/13 (PS): He returns for followup after last visit 2 months ago. He was found to have paroxysmal atrial fibrillation on life watch monitor with controlled heart rate, CHADs2VASC score was 6 and I prescribed Eliquis but has seen Dr. Johney FrameAllred cardiologist on 09/10/13 who has started him on Xarelto instead do to cost reasons. He seems to be tolerating it well without significant bleeding and only minor bruising. He had lab work done on 07/10/13 which showed normal lipid profile and hemoglobin A1c was borderline at 6.2. He continues to have mild residual left-sided peripheral vision difficulties but has been able  to drive okay and has not had any accidents or near misses. He has noticed for the last several years that he has a little bit of fullness and swelling in his right groin and has not yet sought any medical evaluation for this. He denies significant pain but does notice some fullness in the groin.   Initial Consult 07/09/2013 ;[age] year Caucasian male who developed sudden onset of left-sided peripheral vision difficulties on 07/02/13. He noted that he was in black spots on the left side of field of vision and difficulty reading sentences as he could read only parts of the words towards the right and was missing portions on the left. He also developed mild bifrontal headaches which he thought was related to sinuses. The headache was mild and intermittent and did not bother him. Denied any double vision, vertigo, gait, balance difficulties, memory loss focal weakness or numbness. He has no prior history of strokes. He does have vascular factors of hypertension since age 81 as well as coronary artery disease and underwent CABG 7 years ago. He also has hyperlipidemia for which she takes protocol which and lipids were last checked in May and was fine. He had MRI scan of the brain done on 07/07/13 which I personally reviewed and shows a right occipital as well as right frontal MCA branch acute infarcts. MRA of the brain shows no large vessel intracranial stenosis. MRA of the neck showed no extracranial stenosis either. He has a 2-D echocardiogram planned for tomorrow. He was previously on aspirin and Plavix was added by  Dr. Lorenz CoasterKeller. He states that his peripheral vision symptoms improved fairly quickly he still has some difficulty while reading and on the extreme left peripheral visual field.  Update 01/04/2015 : He returns for follow-up after last visit 6 months ago. Continues to do well from stroke standpoint without recurrent stroke or TIA symptoms. He remains on Xarelto which is tolerating well without significant bleeding  or bruising. He states his blood pressure is quite well controlled and today in fact it is 107/60. He remains on Pravachol 80 mg daily which is tolerating well without myalgias or arthralgias. Last lipid profile checked in August 2015 was fine. He has an annual physical exam coming up on 01/15/15 and will have repeat lipid profile checked at that time. 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 He is being treated by ophthalmologist Dr. Delaney MeigsStonecipher. REVIEW OF SYSTEMS: Full 14 system review of systems performed and notable only for:   left eye pain, itching, redness, light sensitivity, runny nose and all other systems negative No complaints  ALLERGIES: Allergies  Allergen Reactions  . Cephalexin Anaphylaxis  . Iodinated Diagnostic Agents Anaphylaxis  . Iohexol Anaphylaxis     THROAT SWELLS AND STOPS BREATHING   . Ace Inhibitors Other (See Comments)  . Metoprolol Other (See Comments)  . Ciprofloxacin Hives, Itching and Rash  . Codeine Other (See Comments)    headaches  . Other Hives, Itching and Rash  . Sulfa Antibiotics Rash    HOME MEDICATIONS: Outpatient Prescriptions Prior to Visit  Medication Sig Dispense Refill  . amLODipine (NORVASC) 5 MG tablet Take 5 mg by mouth every morning.     Marland Kitchen. azithromycin (AZASITE) 1 % ophthalmic solution Place 1 drop into both eyes at bedtime.    . cholecalciferol (VITAMIN D) 1000 UNITS tablet Take 1,000 Units by mouth daily.    Marland Kitchen. desonide (DESOWEN) 0.05 % cream Apply 1 application topically 2 (two) times a week.   0  . doxycycline (VIBRA-TABS) 100 MG tablet Take 100 mg by mouth 2 (two) times daily.   0  . losartan (COZAAR) 100 MG tablet Take 100 mg by mouth every morning.     . Multiple Vitamin (MULTIVITAMIN) capsule Take 1 capsule by mouth daily.      . niacin (NIASPAN) 500 MG CR tablet Take 500 mg by mouth every evening.     Marland Kitchen. omeprazole (PRILOSEC) 20 MG capsule Take 20 mg by mouth every  morning.     . polyethylene glycol (MIRALAX / GLYCOLAX) packet Take 17 g by mouth daily.    . pravastatin (PRAVACHOL) 80 MG tablet Take 80 mg by mouth every evening.     . Rivaroxaban (XARELTO) 15 MG TABS tablet Take 1 tablet (15 mg total) by mouth daily with supper. 90 tablet 3  . saw palmetto 160 MG capsule Take 160 mg by mouth 2 (two) times daily.      . tadalafil (CIALIS) 5 MG tablet Take 5 mg by mouth every evening.     . Testosterone 10 MG/ACT (2%) GEL Place onto the skin daily.      No facility-administered medications prior to visit.    PHYSICAL EXAM Filed Vitals:   01/04/15 1101  BP: 107/60  Pulse: 57  Weight: 178 lb 12.8 oz (81.103 kg)   Body mass index is 25.66 kg/(m^2).  General: well developed, well nourished, seated, in no evident distress  . Left eye shows conjunctival redness with partial  drooping and has photophobia Head: head normocephalic and atraumatic. Orohparynx benign   Neck: supple with no carotid or supraclavicular bruits   Cardiovascular: regular rate and rhythm, no murmurs   Musculoskeletal: no deformity   Skin: no rash/petichiae   Vascular: Normal pulses all extremities   Neurologic Exam   Mental Status: Awake and fully alert. Oriented to place and time. Recent and remote memory intact. Attention span, concentration and fund of knowledge appropriate. Mood and affect appropriate.   Cranial Nerves: Pupils equal, briskly reactive to light. Extraocular movements full without nystagmus. Visual fields almostfull to confrontation with only mild extreme left partial peripheral temporal field defect to bedside confrontational testing. Hearing intact. Facial sensation intact. Face, tongue, palate moves normally and symmetrically.   Motor: Normal bulk and tone. Normal strength in all tested extremity muscles.   Sensory: intact to touch and pinprick and vibratory.   Coordination: Rapid alternating movements normal in all extremities. Finger-to-nose and heel-to-shin  performed accurately bilaterally.   Gait and Station: Arises from chair without difficulty. Stance is normal. Gait demonstrates normal stride length and balance . Able to heel, toe and tandem walk without difficulty.   Reflexes: 1+ and symmetric.   DIAGNOSTIC DATA (LABS, IMAGING, TESTING) - I reviewed patient records, labs, notes, testing and imaging myself where available.  Lab Results  Component Value Date   WBC 6.4 10/09/2014   HGB 13.8 10/09/2014   HCT 40.6 10/09/2014   MCV 88.2 10/09/2014   PLT 146.0* 10/09/2014      Component Value Date/Time   NA 137 10/09/2014 1018   K 4.1 10/09/2014 1018   CL 104 10/09/2014 1018   CO2 30 10/09/2014 1018   GLUCOSE 85 10/09/2014 1018   BUN 26* 10/09/2014 1018   CREATININE 1.65* 10/09/2014 1018   CREATININE 1.38* 04/22/2014 0902   CALCIUM 9.3 10/09/2014 1018   PROT 7.3 04/22/2014 0902   ALBUMIN 4.2 04/22/2014 0902   AST 15 04/22/2014 0902   ALT 12 04/22/2014 0902   ALKPHOS 92 04/22/2014 0902   BILITOT 0.5 04/22/2014 0902   GFRNONAA 48* 04/22/2014 0902   GFRAA 55* 04/22/2014 0902   Lab Results  Component Value Date   CHOL 118 04/22/2014   HDL 45 04/22/2014   LDLCALC 57 04/22/2014   TRIG 79 04/22/2014   CHOLHDL 2.6 04/22/2014    ASSESSMENT: 81 y.o. year old male  has a past medical history of Coronary artery disease; Chest pain; Hypertension; Other malaise and fatigue; Stroke; and Paroxysmal atrial fibrillation here with transient left-sided vision difficulties do to embolic right right PCA and MCA branch infarcts in November 2014, now on Xarelto.  Doing well. New left eye infection  PLAN:  I had a long d/w patient about his remote stroke, risk for recurrent stroke/TIAs, personally independently reviewed imaging studies and stroke evaluation results and answered questions.Continue Xarelto for atrial fibrillation  for secondary stroke prevention and maintain strict control of hypertension with blood pressure goal below 130/90,  diabetes with hemoglobin A1c goal below 6.5% and lipids with LDL cholesterol goal below 100 mg/dL. I advised him to have his fasting lipid profile checked at his upcoming annual physical visit with his primary physician. Check follow-up carotid ultrasound study.  Followup in the future with me in one year or call earlier if necessary.  Delia HeadyPramod Arbor Cohen, MD  01/04/2015, 11:34 AM [facility] [address], [address] [place], [place] [address] [phone]  Note: This document was prepared with digital dictation and possible smart phrase  technology. Any transcriptional errors that result from this process are unintentional.

## 2015-01-11 ENCOUNTER — Ambulatory Visit (HOSPITAL_COMMUNITY)
Admission: RE | Admit: 2015-01-11 | Discharge: 2015-01-11 | Disposition: A | Discharge status: Home / Self Care | Payer: Medicare Other | Source: Ambulatory Visit | Attending: Urology | Admitting: Urology

## 2015-01-11 DIAGNOSIS — N2889 Other specified disorders of kidney and ureter: Secondary | ICD-10-CM | POA: Insufficient documentation

## 2015-01-11 DIAGNOSIS — Q6211 Congenital occlusion of ureteropelvic junction: Secondary | ICD-10-CM

## 2015-01-11 DIAGNOSIS — Q6239 Other obstructive defects of renal pelvis and ureter: Secondary | ICD-10-CM

## 2015-01-11 MED ORDER — TECHNETIUM TC 99M MERTIATIDE
15.6000 | Freq: Once | INTRAVENOUS | Status: AC | PRN
Start: 2015-01-11 — End: 2015-01-11

## 2015-01-11 MED ORDER — FUROSEMIDE 10 MG/ML IJ SOLN
40.0000 mg | Freq: Once | INTRAMUSCULAR | Status: AC
  Administered 2015-01-11: 40 mg via INTRAVENOUS
  Filled 2015-01-11: qty 4

## 2015-01-21 ENCOUNTER — Ambulatory Visit (INDEPENDENT_AMBULATORY_CARE_PROVIDER_SITE_OTHER): Payer: Medicare Other

## 2015-01-21 DIAGNOSIS — I6529 Occlusion and stenosis of unspecified carotid artery: Secondary | ICD-10-CM

## 2015-01-26 ENCOUNTER — Telehealth: Payer: Self-pay | Admitting: Cardiology

## 2015-01-26 NOTE — Telephone Encounter (Signed)
DC xarelto 2 days prior to procedure and resume day after Brian Crenshaw  

## 2015-01-26 NOTE — Telephone Encounter (Signed)
Jose [name] is callnig to see if he can stop Xarelto for an eye procedure on 02/01/2015. Wants to know when to stop and for how long . Please call    Thanks

## 2015-01-26 NOTE — Telephone Encounter (Signed)
Instructions communicated to patient. He verbalized understanding.

## 2015-01-26 NOTE — Telephone Encounter (Signed)
Called pt, he is having an incision & repair on an inverted eyelid - wanted OK from Dr. Jens Somrenshaw to d/c Xarelto for this proc (scheduled for next Monday)  Will defer for OK.

## 2015-05-14 ENCOUNTER — Telehealth: Payer: Self-pay | Admitting: *Deleted

## 2015-05-14 NOTE — Telephone Encounter (Signed)
Pt calls to informs [place] that he will not be able to come in for lab, Xarelto follow up that's is scheduled for Tuesday. Thus, Lab order faxed to [facility] in [place], [place] for CBC and BMET. Pt aware to go today for labs and that we will call him with results upon receipt.

## 2015-05-17 ENCOUNTER — Ambulatory Visit (INDEPENDENT_AMBULATORY_CARE_PROVIDER_SITE_OTHER): Payer: Medicare Other | Admitting: Pharmacist

## 2015-05-17 NOTE — Progress Notes (Signed)
Pt was started on Xarelto for AFIB on September 10, 2013. He is currently taking Xarelto  daily.    Reviewed patients medication list.  Pt is not currently on any combined P-gp and strong CYP3A4 inhibitors/inducers (ketoconazole, traconazole, ritonavir, carbamazepine, phenytoin, rifampin, St. John's wort).  Reviewed labs.  SCr 1.55, Weight 81.1 kg, CrCl- 42 mL/min. Hgb 15.2 and HCT 44.6.    Dose appropriate based on dosing criteria.     A full discussion of the nature of anticoagulants has been carried out.  A benefit/risk analysis has been presented to the patient, so that they understand the justification for choosing anticoagulation with Xarelto at this time.  The need for compliance is stressed.  Pt is aware to take the medication once daily with the largest meal of the day.  Side effects of potential bleeding are discussed, including unusual colored urine or stools, coughing up blood or coffee ground emesis, nose bleeds or serious fall or head trauma.  Discussed signs and symptoms of stroke. The patient should avoid any OTC items containing aspirin or ibuprofen.  Avoid alcohol consumption.   Call if any signs of abnormal bleeding.  Discussed financial obligations and resolved any difficulty in obtaining medication.  Next lab test test in 6 months.

## 2015-07-08 ENCOUNTER — Other Ambulatory Visit: Payer: Self-pay | Admitting: Cardiology

## 2015-07-09 ENCOUNTER — Telehealth: Payer: Self-pay | Admitting: Cardiology

## 2015-07-09 NOTE — Telephone Encounter (Signed)
°*  STAT* If patient is at the pharmacy, call can be transferred to refill team.   1. Which medications need to be refilled? (please list name of each medication and dose if known) Xarelto  2. Which pharmacy/location (including street and city if local pharmacy) is medication to be sent to?[facility] in [address]  3. Do they need a 30 day or 90 day supply? He did not say

## 2015-07-12 NOTE — Telephone Encounter (Signed)
Called pt and left message informing him that his medication of Xarelto 5 mg tablets was sent to his pharmacy, a 90 day supply and that he needed to call back to schedule a 6 mos appointment.

## 2015-08-18 ENCOUNTER — Encounter: Payer: Self-pay | Admitting: *Deleted

## 2015-08-31 NOTE — Progress Notes (Signed)
HPI: FU CAD and atrial fibrillation. Patient underwent coronary artery bypass and graft in 2007. He had a LIMA to the LAD, saphenous vein graft to first diagonal, sequential saphenous vein graft to the first marginal and second marginal, sequential saphenous vein graft to the PDA and posterior lateral. He also had closure of PFO. Abdominal ultrasound May 2014 showed no aneurysm. Had CVA in November of 2014. Echocardiogram November 2014 showed an ejection fraction of 45-50%, grade 1 diastolic dysfunction and mild left atrial enlargement. Transesophageal echocardiogram November 2014 showed normal LV function, mild prolapse of the posterior mitral valve leaflet and an atrial septal aneurysm with negative bubble study. Carotid Dopplers November 2014 showed less than 40% bilateral stenosis. 30 day event monitor showed atrial fibrillation. Nuclear study in September 2015 showed an ejection fraction of 53%. Inferior basal scar but no ischemia. Since last seen, Patient denies dyspnea, chest pain, palpitations, syncope or bleeding.  Current Outpatient Prescriptions  Medication Sig Dispense Refill  . amLODipine (NORVASC) 5 MG tablet Take 5 mg by mouth every morning.     . cholecalciferol (VITAMIN D) 1000 UNITS tablet Take 1,000 Units by mouth daily.    . COD LIVER OIL PO Take 415 mg by mouth daily.    Marland Kitchen. desonide (DESOWEN) 0.05 % cream Apply 1 application topically 2 (two) times a week.   0  . losartan (COZAAR) 100 MG tablet Take 100 mg by mouth every morning.     . Multiple Vitamin (MULTIVITAMIN) capsule Take 1 capsule by mouth daily.      . niacin (NIASPAN) 500 MG CR tablet Take 500 mg by mouth every evening.     Marland Kitchen. omeprazole (PRILOSEC) 20 MG capsule Take 20 mg by mouth every morning.     . polyethylene glycol (MIRALAX / GLYCOLAX) packet Take 17 g by mouth daily.    . pravastatin (PRAVACHOL) 80 MG tablet Take 80 mg by mouth every evening.     . saw palmetto 160 MG capsule Take 160 mg by mouth 2 (two)  times daily.      . tadalafil (CIALIS) 5 MG tablet Take 5 mg by mouth every evening.     . Testosterone 10 MG/ACT (2%) GEL Place onto the skin daily.     Carlena Hurl. XARELTO 15 MG TABS tablet TAKE 1 TABLET BY MOUTH DAILY WITH SUPPER 90 tablet 0   No current facility-administered medications for this visit.     Past Medical History  Diagnosis Date  . Hypertension   . Other malaise and fatigue   . Paroxysmal atrial fibrillation (HCC)   . History of embolic stroke no residual    11/ 2014  --  right PCA and MCA branch infarts (left side vision difficulties)  . Bilateral carotid artery stenosis     mild bilateral proximcal ICA  40% per duplex 11/ 2014  . S/P CABG x 6     2007  . GERD (gastroesophageal reflux disease)   . Ureteral obstruction, right   . At risk for sleep apnea     STOP-BANG= 5   SENT TO PCP 05-20-2014  . ED (erectile dysfunction)   . Coronary artery disease CARDIOLOGIST-  DR  CRENSHAW    s/p cabg 2007  . Eye infection 12/2014    Past Surgical History  Procedure Laterality Date  . Tee without cardioversion N/A 07/17/2013    Procedure: TRANSESOPHAGEAL ECHOCARDIOGRAM (TEE);  Surgeon: Wendall StadePeter C Nishan, MD;  [facility];  Service: Cardiovascular;  Laterality:  N/A;  normal LV size, mild prolapse of posterior mitral valve leaflet,  mild MR and TR, normal AV,  no LAA thrombus,  atrial septal aneurysm with no PFO/ ASD and negative bubble study, normal RV, normal aorta with no debris  . Laparoscopic cholecystectomy  10-02-2000  . Coronary artery bypass graft  11-01-2005  dr Cornelius Moras    CLOSURE OF PFO/  LIMA to LAD,  SVG to D1,  SVG to 1st & 2nd  MARGINAL branches, SVG  to PDA and posterior lateral  . Inguinal hernia repair Bilateral   . Cardiac catheterization  10-31-2005  dr wall    severe three vessel/  perserved LV  . Cardiovascular stress test  last one 05-19-2014  dr Jens Som    low risk lexiscan no exercise study/ small inferobasal infarct with no ischemia /  ef 53%  .  Appendectomy  1950's  . Right ureter obstruction surgery  age 4  . Cystoscopy w/ ureteral stent placement Right 05/21/2014    Procedure: CYSTOSCOPY WITH RETROGRADE PYELOGRAM/URETERAL STENT PLACEMENT;  Surgeon: Kathi Ludwig, MD;  Location: [facility];  Service: Urology;  Laterality: Right;  . Cystoscopy with ureteroscopy Right 05/21/2014    Procedure: CYSTOSCOPY WITH URETEROSCOPY;  Surgeon: Kathi Ludwig, MD;  Location[facility];  Service: Urology;  Laterality: Right;    Social History   Social History  . Marital Status: Married    Spouse Name: maxine  . Number of Children: 4  . Years of Education: college4   Occupational History  . retired    Social History Main Topics  . Smoking status: Former Smoker -- 1.00 packs/day for 35 years    Types: Cigarettes    Quit date: 08/29/1977  . Smokeless tobacco: Never Used  . Alcohol Use: No  . Drug Use: No  . Sexual Activity: Not on file   Other Topics Concern  . Not on file   Social History Narrative   Patient is married with 4 children.   Patient is right handed.   Patient has college education.   Patient drinks decaff coffee.    ROS: Right shoulder arthralgias but no fevers or chills, productive cough, hemoptysis, dysphasia, odynophagia, melena, hematochezia, dysuria, hematuria, rash, seizure activity, orthopnea, PND, pedal edema, claudication. Remaining systems are negative.  Physical Exam: Well-developed well-nourished in no acute distress.  Skin is warm and dry.  HEENT is normal.  Neck is supple.  Chest is clear to auscultation with normal expansion.  Cardiovascular exam is regular rate and rhythm.  Abdominal exam nontender or distended. No masses palpated. Extremities show no edema. neuro grossly intact  ECG Sinus bradycardia at a rate of 56. Rule out prior inferior infarct.

## 2015-09-01 ENCOUNTER — Ambulatory Visit (INDEPENDENT_AMBULATORY_CARE_PROVIDER_SITE_OTHER): Payer: Medicare Other | Admitting: Cardiology

## 2015-09-01 ENCOUNTER — Encounter: Payer: Self-pay | Admitting: Cardiology

## 2015-09-01 VITALS — BP 116/72 | HR 56 | Ht 70.0 in | Wt 178.0 lb

## 2015-09-01 DIAGNOSIS — I10 Essential (primary) hypertension: Secondary | ICD-10-CM | POA: Diagnosis not present

## 2015-09-01 DIAGNOSIS — I480 Paroxysmal atrial fibrillation: Secondary | ICD-10-CM | POA: Diagnosis not present

## 2015-09-01 DIAGNOSIS — I25810 Atherosclerosis of coronary artery bypass graft(s) without angina pectoris: Secondary | ICD-10-CM

## 2015-09-01 DIAGNOSIS — I2510 Atherosclerotic heart disease of native coronary artery without angina pectoris: Secondary | ICD-10-CM | POA: Diagnosis not present

## 2015-09-01 NOTE — Patient Instructions (Signed)
Medication Instructions:   NO CHANGE  Follow-Up:  Your physician wants you to follow-up in: ONE YEAR WITH DR CRENSHAW You will receive a reminder letter in the mail two months in advance. If you don't receive a letter, please call our office to schedule the follow-up appointment.   If you need a refill on your cardiac medications before your next appointment, please call your pharmacy.  

## 2015-09-01 NOTE — Assessment & Plan Note (Addendum)
Blood pressure controlled. Continue present medications. K and renal function monitored by primary care. 

## 2015-09-01 NOTE — Assessment & Plan Note (Addendum)
Patient remains in sinus rhythm.Continue xarelto. Hemoglobin and renal function monitored by primary care. Patient will require lifelong anticoagulation given history of CVA

## 2015-09-01 NOTE — Assessment & Plan Note (Signed)
Continue statin. Not on aspirin given need for anticoagulation. 

## 2015-09-01 NOTE — Assessment & Plan Note (Signed)
Continue statin. Lipids and liver monitored by primary care. 

## 2015-10-27 DIAGNOSIS — J189 Pneumonia, unspecified organism: Secondary | ICD-10-CM

## 2015-10-27 HISTORY — DX: Pneumonia, unspecified organism: J18.9

## 2015-11-08 ENCOUNTER — Ambulatory Visit
Admission: RE | Admit: 2015-11-08 | Discharge: 2015-11-08 | Disposition: A | Discharge status: Home / Self Care | Payer: Medicare Other | Source: Ambulatory Visit | Attending: Family Medicine | Admitting: Family Medicine

## 2015-11-08 ENCOUNTER — Other Ambulatory Visit: Payer: Self-pay | Admitting: Family Medicine

## 2015-11-08 DIAGNOSIS — R05 Cough: Secondary | ICD-10-CM

## 2015-11-08 DIAGNOSIS — R059 Cough, unspecified: Secondary | ICD-10-CM

## 2015-11-22 ENCOUNTER — Ambulatory Visit
Admission: RE | Admit: 2015-11-22 | Discharge: 2015-11-22 | Disposition: A | Discharge status: Home / Self Care | Payer: Medicare Other | Source: Ambulatory Visit | Attending: Family Medicine | Admitting: Family Medicine

## 2015-11-22 ENCOUNTER — Other Ambulatory Visit: Payer: Self-pay | Admitting: Family Medicine

## 2015-11-22 ENCOUNTER — Encounter (HOSPITAL_COMMUNITY): Payer: Self-pay | Admitting: Family Medicine

## 2015-11-22 ENCOUNTER — Inpatient Hospital Stay (HOSPITAL_COMMUNITY)
Admission: AD | Admit: 2015-11-22 | Discharge: 2015-11-28 | DRG: 291 | Disposition: A | Discharge status: Home / Self Care | Payer: Medicare Other | Source: Ambulatory Visit | Attending: Internal Medicine | Admitting: Internal Medicine

## 2015-11-22 DIAGNOSIS — N183 Chronic kidney disease, stage 3 unspecified: Secondary | ICD-10-CM | POA: Diagnosis present

## 2015-11-22 DIAGNOSIS — J8489 Other specified interstitial pulmonary diseases: Secondary | ICD-10-CM | POA: Diagnosis present

## 2015-11-22 DIAGNOSIS — N184 Chronic kidney disease, stage 4 (severe): Secondary | ICD-10-CM | POA: Diagnosis present

## 2015-11-22 DIAGNOSIS — R7989 Other specified abnormal findings of blood chemistry: Secondary | ICD-10-CM | POA: Diagnosis present

## 2015-11-22 DIAGNOSIS — I5023 Acute on chronic systolic (congestive) heart failure: Secondary | ICD-10-CM | POA: Diagnosis not present

## 2015-11-22 DIAGNOSIS — Z951 Presence of aortocoronary bypass graft: Secondary | ICD-10-CM | POA: Diagnosis not present

## 2015-11-22 DIAGNOSIS — I6523 Occlusion and stenosis of bilateral carotid arteries: Secondary | ICD-10-CM | POA: Diagnosis present

## 2015-11-22 DIAGNOSIS — K219 Gastro-esophageal reflux disease without esophagitis: Secondary | ICD-10-CM | POA: Diagnosis present

## 2015-11-22 DIAGNOSIS — I5043 Acute on chronic combined systolic (congestive) and diastolic (congestive) heart failure: Secondary | ICD-10-CM | POA: Diagnosis present

## 2015-11-22 DIAGNOSIS — J849 Interstitial pulmonary disease, unspecified: Secondary | ICD-10-CM | POA: Diagnosis present

## 2015-11-22 DIAGNOSIS — Z8673 Personal history of transient ischemic attack (TIA), and cerebral infarction without residual deficits: Secondary | ICD-10-CM | POA: Diagnosis not present

## 2015-11-22 DIAGNOSIS — N1330 Unspecified hydronephrosis: Secondary | ICD-10-CM | POA: Diagnosis present

## 2015-11-22 DIAGNOSIS — J129 Viral pneumonia, unspecified: Secondary | ICD-10-CM | POA: Diagnosis present

## 2015-11-22 DIAGNOSIS — Z7901 Long term (current) use of anticoagulants: Secondary | ICD-10-CM | POA: Diagnosis not present

## 2015-11-22 DIAGNOSIS — I130 Hypertensive heart and chronic kidney disease with heart failure and stage 1 through stage 4 chronic kidney disease, or unspecified chronic kidney disease: Principal | ICD-10-CM | POA: Diagnosis present

## 2015-11-22 DIAGNOSIS — I4891 Unspecified atrial fibrillation: Secondary | ICD-10-CM | POA: Diagnosis present

## 2015-11-22 DIAGNOSIS — I10 Essential (primary) hypertension: Secondary | ICD-10-CM | POA: Diagnosis not present

## 2015-11-22 DIAGNOSIS — J181 Lobar pneumonia, unspecified organism: Secondary | ICD-10-CM | POA: Diagnosis present

## 2015-11-22 DIAGNOSIS — E871 Hypo-osmolality and hyponatremia: Secondary | ICD-10-CM | POA: Diagnosis present

## 2015-11-22 DIAGNOSIS — J9601 Acute respiratory failure with hypoxia: Secondary | ICD-10-CM | POA: Diagnosis present

## 2015-11-22 DIAGNOSIS — Z09 Encounter for follow-up examination after completed treatment for conditions other than malignant neoplasm: Secondary | ICD-10-CM

## 2015-11-22 DIAGNOSIS — E1122 Type 2 diabetes mellitus with diabetic chronic kidney disease: Secondary | ICD-10-CM | POA: Diagnosis present

## 2015-11-22 DIAGNOSIS — I2510 Atherosclerotic heart disease of native coronary artery without angina pectoris: Secondary | ICD-10-CM | POA: Diagnosis present

## 2015-11-22 DIAGNOSIS — Z8249 Family history of ischemic heart disease and other diseases of the circulatory system: Secondary | ICD-10-CM

## 2015-11-22 DIAGNOSIS — I509 Heart failure, unspecified: Secondary | ICD-10-CM | POA: Diagnosis not present

## 2015-11-22 DIAGNOSIS — R778 Other specified abnormalities of plasma proteins: Secondary | ICD-10-CM | POA: Diagnosis present

## 2015-11-22 DIAGNOSIS — R0602 Shortness of breath: Secondary | ICD-10-CM | POA: Diagnosis present

## 2015-11-22 DIAGNOSIS — Z87891 Personal history of nicotine dependence: Secondary | ICD-10-CM

## 2015-11-22 DIAGNOSIS — E785 Hyperlipidemia, unspecified: Secondary | ICD-10-CM | POA: Diagnosis present

## 2015-11-22 DIAGNOSIS — I480 Paroxysmal atrial fibrillation: Secondary | ICD-10-CM | POA: Diagnosis present

## 2015-11-22 DIAGNOSIS — R0600 Dyspnea, unspecified: Secondary | ICD-10-CM | POA: Diagnosis present

## 2015-11-22 DIAGNOSIS — I5021 Acute systolic (congestive) heart failure: Secondary | ICD-10-CM | POA: Insufficient documentation

## 2015-11-22 DIAGNOSIS — Z8701 Personal history of pneumonia (recurrent): Secondary | ICD-10-CM | POA: Diagnosis not present

## 2015-11-22 DIAGNOSIS — Z8774 Personal history of (corrected) congenital malformations of heart and circulatory system: Secondary | ICD-10-CM

## 2015-11-22 DIAGNOSIS — I5033 Acute on chronic diastolic (congestive) heart failure: Secondary | ICD-10-CM | POA: Diagnosis not present

## 2015-11-22 LAB — CBC WITH DIFFERENTIAL/PLATELET
BASOS PCT: 0 %
Basophils Absolute: 0 10*3/uL (ref 0.0–0.1)
EOS ABS: 0.2 10*3/uL (ref 0.0–0.7)
EOS PCT: 1 %
HEMATOCRIT: 39.1 % (ref 39.0–52.0)
Hemoglobin: 13.5 g/dL (ref 13.0–17.0)
Lymphocytes Relative: 7 %
Lymphs Abs: 1.2 10*3/uL (ref 0.7–4.0)
MCH: 30.4 pg (ref 26.0–34.0)
MCHC: 34.5 g/dL (ref 30.0–36.0)
MCV: 88.1 fL (ref 78.0–100.0)
MONO ABS: 0.9 10*3/uL (ref 0.1–1.0)
MONOS PCT: 5 %
NEUTROS ABS: 14.3 10*3/uL — AB (ref 1.7–7.7)
Neutrophils Relative %: 87 %
PLATELETS: 178 10*3/uL (ref 150–400)
RBC: 4.44 MIL/uL (ref 4.22–5.81)
RDW: 14 % (ref 11.5–15.5)
WBC: 16.6 10*3/uL — ABNORMAL HIGH (ref 4.0–10.5)

## 2015-11-22 LAB — BASIC METABOLIC PANEL
Anion gap: 8 (ref 5–15)
BUN: 19 mg/dL (ref 6–20)
CALCIUM: 11.2 mg/dL — AB (ref 8.9–10.3)
CO2: 26 mmol/L (ref 22–32)
CREATININE: 1.34 mg/dL — AB (ref 0.61–1.24)
Chloride: 100 mmol/L — ABNORMAL LOW (ref 101–111)
GFR calc non Af Amer: 48 mL/min — ABNORMAL LOW (ref >60)
GFR, EST AFRICAN AMERICAN: 55 mL/min — AB (ref >60)
Glucose, Bld: 101 mg/dL — ABNORMAL HIGH (ref 65–99)
Potassium: 4.5 mmol/L (ref 3.5–5.1)
Sodium: 134 mmol/L — ABNORMAL LOW (ref 135–145)

## 2015-11-22 LAB — BRAIN NATRIURETIC PEPTIDE: B Natriuretic Peptide: 339.2 pg/mL — ABNORMAL HIGH (ref 0.0–100.0)

## 2015-11-22 LAB — MAGNESIUM: MAGNESIUM: 1.6 mg/dL — AB (ref 1.7–2.4)

## 2015-11-22 LAB — TSH: TSH: 0.642 u[IU]/mL (ref 0.350–4.500)

## 2015-11-22 LAB — TROPONIN I: TROPONIN I: 0.04 ng/mL — AB (ref <0.031)

## 2015-11-22 MED ORDER — MAGNESIUM SULFATE 2 GM/50ML IV SOLN
2.0000 g | Freq: Once | INTRAVENOUS | Status: AC
  Administered 2015-11-22: 2 g via INTRAVENOUS
  Filled 2015-11-22: qty 50

## 2015-11-22 MED ORDER — AMLODIPINE BESYLATE 5 MG PO TABS
5.0000 mg | ORAL_TABLET | Freq: Every day | ORAL | Status: DC
Start: 2015-11-23 — End: 2015-11-23
  Administered 2015-11-23: 5 mg via ORAL
  Filled 2015-11-22: qty 1

## 2015-11-22 MED ORDER — ONDANSETRON HCL 4 MG/2ML IJ SOLN: 4.0000 mg | Freq: Four times a day (QID) | INTRAMUSCULAR | Status: DC | PRN

## 2015-11-22 MED ORDER — PANTOPRAZOLE SODIUM 40 MG PO TBEC
40.0000 mg | DELAYED_RELEASE_TABLET | Freq: Every day | ORAL | Status: DC
  Administered 2015-11-23 – 2015-11-28 (×6): 40 mg via ORAL
  Filled 2015-11-22 (×6): qty 1

## 2015-11-22 MED ORDER — LOSARTAN POTASSIUM 50 MG PO TABS
100.0000 mg | ORAL_TABLET | Freq: Every day | ORAL | Status: DC
  Administered 2015-11-23: 100 mg via ORAL
  Filled 2015-11-22: qty 2

## 2015-11-22 MED ORDER — POLYVINYL ALCOHOL 1.4 % OP SOLN
1.0000 [drp] | Freq: Two times a day (BID) | OPHTHALMIC | Status: DC
  Administered 2015-11-23 – 2015-11-27 (×9): 1 [drp] via OPHTHALMIC
  Filled 2015-11-22 (×2): qty 15

## 2015-11-22 MED ORDER — ALBUTEROL SULFATE (2.5 MG/3ML) 0.083% IN NEBU: 2.5000 mg | INHALATION_SOLUTION | RESPIRATORY_TRACT | Status: DC | PRN

## 2015-11-22 MED ORDER — TRAZODONE HCL 50 MG PO TABS
25.0000 mg | ORAL_TABLET | Freq: Every evening | ORAL | Status: DC | PRN
Start: 2015-11-22 — End: 2015-11-28

## 2015-11-22 MED ORDER — ONDANSETRON HCL 4 MG PO TABS: 4.0000 mg | ORAL_TABLET | Freq: Four times a day (QID) | ORAL | Status: DC | PRN

## 2015-11-22 MED ORDER — PRAVASTATIN SODIUM 40 MG PO TABS
80.0000 mg | ORAL_TABLET | Freq: Every day | ORAL | Status: DC
  Administered 2015-11-22 – 2015-11-27 (×6): 80 mg via ORAL
  Filled 2015-11-22 (×7): qty 2

## 2015-11-22 MED ORDER — GUAIFENESIN-DM 100-10 MG/5ML PO SYRP: 5.0000 mL | ORAL_SOLUTION | ORAL | Status: DC | PRN

## 2015-11-22 MED ORDER — RIVAROXABAN 15 MG PO TABS
15.0000 mg | ORAL_TABLET | Freq: Every day | ORAL | Status: DC
  Administered 2015-11-22 – 2015-11-27 (×6): 15 mg via ORAL
  Filled 2015-11-22 (×6): qty 1

## 2015-11-22 MED ORDER — FUROSEMIDE 10 MG/ML IJ SOLN
40.0000 mg | Freq: Once | INTRAMUSCULAR | Status: AC
  Administered 2015-11-22: 40 mg via INTRAVENOUS
  Filled 2015-11-22: qty 4

## 2015-11-22 MED ORDER — POLYETHYLENE GLYCOL 3350 17 G PO PACK
17.0000 g | PACK | Freq: Every day | ORAL | Status: DC | PRN
  Administered 2015-11-24 – 2015-11-27 (×3): 17 g via ORAL
  Filled 2015-11-22 (×3): qty 1

## 2015-11-22 NOTE — Progress Notes (Addendum)
Pt arrived the unit from home , assessment completed see flow sheet, skin warm and dry  vital signs obtained, pt O2 was 70s on room air,complained of SOB, placed on O24L to bring his O2sat above 92. Pt oriented to room and staff, [facility]

## 2015-11-22 NOTE — H&P (Signed)
History and Physical  Patient Name: Jose [name]     ZGY:[id number]    DOB: 10/21/1930    DOA: 11/22/2015 Referring physician: Derinda Late, MD PCP: Marylene Land, MD      Chief Complaint: Dyspnea on exertion  HPI: Jose [name] is a 82 y.o. male with a past medical history significant for CAD s/p CABG, pAF on Xarelto, HTN and CHF EF 45% who presents with dyspnea for two weeks.  The patient was in his usual state of health until about 2 and half weeks ago when he started to develop shortness of breath. He was seen by his PCP, diagnosed with pneumonia based on patchy interstitial opacities on CXR and low grade fever, and treated with Levaquin, but experienced no improvement in symptoms, and in fact worsening since then. In particular he notes persisting shortness of breath, shortness of breath worse with exertion, difficulty sleeping from chest congestion with lying flat improved with sitting up, and ankle swelling. He denies fever, rigors, dysuria, frequent diarrhea, confusion, syncope.     the patient was seen today by his PCP again, a chest x-ray showed pulmonary edema, CHF was suspected, and TRH were asked to except in direct admit.   At baseline, he is retired, does work around the [place] several times per week.  His last echocardiogram was in 2014, and showed ejection fraction 45-50%, he is not on daily furosemide. He noted a 5 pound weight gain around the start of this illness, but has since come back down to around his baseline weight of 172 pounds.   Review of Systems:  All other systems negative except as just noted or noted in the history of present illness.  Allergies  Allergen Reactions  . Cephalexin Anaphylaxis  . Iodinated Diagnostic Agents Anaphylaxis  . Iohexol Anaphylaxis     THROAT SWELLS AND STOPS BREATHING   . Ace Inhibitors Cough  . Metoprolol Other (See Comments)    Erectile dysfunction  . Viagra [Sildenafil Citrate] Other (See Comments)   Headache and vision changes  . Ciprofloxacin Hives, Itching and Rash  . Codeine Nausea And Vomiting and Other (See Comments)    headaches  . Sulfa Antibiotics Rash    Prior to Admission medications   Medication Sig Start Date End Date Taking? Authorizing Provider  Albuterol Sulfate (PROAIR RESPICLICK) 591 (90 Base) MCG/ACT AEPB Inhale 2 puffs into the lungs every 4 (four) hours as needed (shortness of breath/ wheezing).   Yes Historical Provider, MD  amLODipine (NORVASC) 5 MG tablet Take 5 mg by mouth daily.    Yes Historical Provider, MD  cholecalciferol (VITAMIN D) 1000 UNITS tablet Take 1,000 Units by mouth daily.   Yes Historical Provider, MD  COD LIVER OIL PO Take 1 capsule by mouth at bedtime.    Yes Historical Provider, MD  desonide (DESOWEN) 0.05 % cream Apply 1 application topically every other day. For rosacea 08/12/14  Yes Historical Provider, MD  losartan (COZAAR) 100 MG tablet Take 100 mg by mouth daily.    Yes Historical Provider, MD  Multiple Vitamin (MULTIVITAMIN WITH MINERALS) TABS tablet Take 1 tablet by mouth daily.   Yes Historical Provider, MD  omeprazole (PRILOSEC OTC) 20 MG tablet Take 20 mg by mouth daily.   Yes Historical Provider, MD  Polyethyl Glycol-Propyl Glycol (SYSTANE ULTRA OP) Place 1 drop into both eyes 2 (two) times daily.   Yes Historical Provider, MD  polyethylene glycol (MIRALAX / GLYCOLAX) packet Take 17 g by mouth daily.  Mix in 8 oz liquid and drink   Yes Historical Provider, MD  pravastatin (PRAVACHOL) 80 MG tablet Take 80 mg by mouth at bedtime.    Yes Historical Provider, MD  saw palmetto 160 MG capsule Take 160 mg by mouth 2 (two) times daily.     Yes Historical Provider, MD  tadalafil (CIALIS) 5 MG tablet Take 5 mg by mouth at bedtime.    Yes Historical Provider, MD  TESTOSTERONE TD Place 1 mL onto the skin daily. Apply every morning to back shoulder - 10% lipoderm cream compounded at [facility]   Yes Historical Provider, MD  XARELTO 15 MG  TABS tablet TAKE 1 TABLET BY MOUTH DAILY WITH SUPPER 07/09/15  Yes Lelon Perla, MD    Past Medical History  Diagnosis Date  . Hypertension   . Other malaise and fatigue   . Paroxysmal atrial fibrillation (HCC)   . History of embolic stroke no residual    11/ 2014  --  right PCA and MCA branch infarts (left side vision difficulties)  . Bilateral carotid artery stenosis     mild bilateral proximcal ICA  40% per duplex 11/ 2014  . S/P CABG x 6     2007  . GERD (gastroesophageal reflux disease)   . Ureteral obstruction, right   . At risk for sleep apnea     STOP-BANG= 5   SENT TO PCP 05-20-2014  . ED (erectile dysfunction)   . Coronary artery disease CARDIOLOGIST-  DR  CRENSHAW    s/p cabg 2007  . Eye infection 12/2014    Past Surgical History  Procedure Laterality Date  . Tee without cardioversion N/A 07/17/2013    Procedure: TRANSESOPHAGEAL ECHOCARDIOGRAM (TEE);  Surgeon: Josue Hector, MD;  Location: [facility] ENDOSCOPY;  Service: Cardiovascular;  Laterality: N/A;  normal LV size, mild prolapse of posterior mitral valve leaflet,  mild MR and TR, normal AV,  no LAA thrombus,  atrial septal aneurysm with no PFO/ ASD and negative bubble study, normal RV, normal aorta with no debris  . Laparoscopic cholecystectomy  10-02-2000  . Coronary artery bypass graft  11-01-2005  dr Roxy Manns    CLOSURE OF PFO/  LIMA to LAD,  SVG to D1,  SVG to 1st & 2nd  MARGINAL branches, SVG  to PDA and posterior lateral  . Inguinal hernia repair Bilateral   . Cardiac catheterization  10-31-2005  dr wall    severe three vessel/  perserved LV  . Cardiovascular stress test  last one 05-19-2014  dr Stanford Breed    low risk lexiscan no exercise study/ small inferobasal infarct with no ischemia /  ef 53%  . Appendectomy  1950's  . Right ureter obstruction surgery  age 45  . Cystoscopy w/ ureteral stent placement Right 05/21/2014    Procedure: CYSTOSCOPY WITH RETROGRADE PYELOGRAM/URETERAL STENT PLACEMENT;  Surgeon: Ailene Rud, MD;  Location[facility];  Service: Urology;  Laterality: Right;  . Cystoscopy with ureteroscopy Right 05/21/2014    Procedure: CYSTOSCOPY WITH URETEROSCOPY;  Surgeon: Ailene Rud, MD;  Location: [facility];  Service: Urology;  Laterality: Right;    Family history: family history includes Aneurysm in his brother; CAD in his brother; Depression in his sister; Heart disease in his mother; Hypertension in his mother; Lung cancer in his brother; Lymphoma in his father; Other in his brother and sister; Prostate cancer in his brother and father.  Social History: Patient lives with his wife.  He  is from near [place].  He smoked until 1979.  He does not use a cane or walker, has no dementia, and golfs regularly.    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     Physical Exam: BP 149/69 mmHg  Pulse 78  Temp(Src) 98.3 F (36.8 C) (Oral)  Resp 22  Ht 5' 9" (1.753 m)  Wt 78.79 kg (173 lb 11.2 oz)  BMI 25.64 kg/m2  SpO2 91% General appearance: Well-developed, elderly adult male, alert and in moderate distress from dyspnea.   Eyes: Anicteric, conjunctiva injected, lids and lashes normal.     ENT: No nasal deformity, discharge, or epistaxis.  OP moist without lesions.   Lymph: No cervical or supraclavicular lymphadenopathy. Skin: Warm and dry.  Ruddy.  No jaundice.  No suspicious rashes or lesions. Cardiac: RRR, nl S1-S2, no murmurs appreciated.  Capillary refill is brisk.  JVP elevated.   Pitting 1+ bilateral LE edema to shin.  Radial and DP pulses 2+ and symmetric. Respiratory: Normal respiratory rate and rhythm.  Rales bilaterally to mid chest. Abdomen: Abdomen soft without rigidity.  No TTP. No ascites, distension.   MSK: No deformities or effusions. Neuro: Sensorium intact and responding to questions, attention normal.  Speech is fluent.  Moves all extremities equally and with normal coordination.   Cranial nerves grossly  intact. Psych: Behavior appropriate.  Affect normal.  No evidence of aural or visual hallucinations or delusions.       Labs on Admission:  The metabolic panel shows hyponatremia, chronic CKD eGFR 48, hypercalcemia. BNP elevated. Troponin minimally elevated. Magnesium low. TSH pending. The complete blood count shows leukocytosis without anemia or thrombocytopenia.   Radiological Exams on Admission: Personally reviewed: Dg Chest 2 View  11/22/2015  CLINICAL DATA:  Followup pneumonia EXAM: CHEST  2 VIEW COMPARISON:  11/08/2015 FINDINGS: Cardiac shadow remains enlarged. Postsurgical changes are again seen. Increased bilateral opacities are noted when compared with the prior exam. This likely represents a component of superimposed edema over the chronic changes seen previously. No focal confluent infiltrate or effusion is noted. No bony abnormality is seen. IMPRESSION: Diffuse increased opacities bilaterally likely related to pulmonary edema. Electronically Signed   By: Inez Catalina M.D.   On: 11/22/2015 16:10    EKG: Independently reviewed. Rate 74, sinus rhythm, QTc 399.  ?small ST depressions in V3-5, also old inferior TWI compared to January.      Assessment/Plan 1. Acute on chronic systolic CHF:  This is new.  Last echo 2014 showed some LVSD.  Not on furosemide at baseline.  Functional class 1 at baseline.  Now with dyspnea on exertion, rales, pulmonary edema on CXR, orthopnea, PND, and ankle swelling.   -Check BMP, BNP -Echocardiogram ordered -Furosemide 40 mg IV once -Strict I/Os, daily weights, and close monitoring of electrolytes and renal function -Goal net negative 1-1.5L daily -Check TSH -Continue home ARB -Not on BB, has been intolerant to metoprolol in the past   2. pAF:  CHADs2Vasc 4.  On Xarelto.  In sinus rhythm presently. -Continue rivaroxaban -Telemetry for now -Check magnesium  3. HTN and CAD secondary prevention:  Hypertensive at admission.  Not on aspirin  given rivaroxaban. -Continue home amlodipine and losartan and statin  4. Leukocytosis:  With neutrophil predominance.  Unclear etiology.  Just finished Levaquin.  Presumably this is reactive or demargination.   -Trend CBC  5. Elevated troponin:  Suspect this is from CHF and fluid overload.  No active chest pain, ACS  doubted. -Cycle enzymes -Consult to Cardiology, appreciate cares  6. Hyponatremia:  Hypervolemic hyponatremia. -Diuresis and trend BMP  7. Hypomagnesemia:  -Supplement now  8. CKD stage 3 eGFR 48:  Stable. -Close monitoring while diuresing  9. Hypercalcemia: Unclear etiology.  Previously normal.      DVT PPx: Rivaroxaban Diet: Heart healthy Consultants: None Code Status: FULL Family Communication: Wife at bedside.  Overnight plan discussed.  All questions answered.  CODE STATUS confirmed.  Medical decision making: What exists of the patient's previous chart was reviewed in depth and outside records/notes/labs from his PCP were reviewed. Patient seen 7:55 PM on 11/22/2015.  Disposition Plan:  I recommend admission to telemetry, inpatient status for CHF.  Clinical condition: stable.  Anticipate diuresis for 3-4 days then discharge when respiratory status improved.      Edwin Dada [facility] Pager [phone]

## 2015-11-23 ENCOUNTER — Inpatient Hospital Stay (HOSPITAL_COMMUNITY): Payer: Medicare Other

## 2015-11-23 ENCOUNTER — Encounter (HOSPITAL_COMMUNITY): Payer: Self-pay | Admitting: Student

## 2015-11-23 DIAGNOSIS — R7989 Other specified abnormal findings of blood chemistry: Secondary | ICD-10-CM

## 2015-11-23 DIAGNOSIS — I5043 Acute on chronic combined systolic (congestive) and diastolic (congestive) heart failure: Secondary | ICD-10-CM

## 2015-11-23 DIAGNOSIS — I509 Heart failure, unspecified: Secondary | ICD-10-CM

## 2015-11-23 DIAGNOSIS — I480 Paroxysmal atrial fibrillation: Secondary | ICD-10-CM

## 2015-11-23 LAB — BASIC METABOLIC PANEL
Anion gap: 7 (ref 5–15)
BUN: 19 mg/dL (ref 6–20)
CHLORIDE: 100 mmol/L — AB (ref 101–111)
CO2: 27 mmol/L (ref 22–32)
CREATININE: 1.46 mg/dL — AB (ref 0.61–1.24)
Calcium: 10.9 mg/dL — ABNORMAL HIGH (ref 8.9–10.3)
GFR calc Af Amer: 50 mL/min — ABNORMAL LOW (ref >60)
GFR calc non Af Amer: 43 mL/min — ABNORMAL LOW (ref >60)
GLUCOSE: 111 mg/dL — AB (ref 65–99)
POTASSIUM: 4.3 mmol/L (ref 3.5–5.1)
SODIUM: 134 mmol/L — AB (ref 135–145)

## 2015-11-23 LAB — ECHOCARDIOGRAM COMPLETE
Height: 69 in
WEIGHTICAEL: 2687.85 [oz_av]

## 2015-11-23 LAB — CBC
HEMATOCRIT: 37.7 % — AB (ref 39.0–52.0)
HEMOGLOBIN: 12.7 g/dL — AB (ref 13.0–17.0)
MCH: 29.9 pg (ref 26.0–34.0)
MCHC: 33.7 g/dL (ref 30.0–36.0)
MCV: 88.7 fL (ref 78.0–100.0)
PLATELETS: 173 10*3/uL (ref 150–400)
RBC: 4.25 MIL/uL (ref 4.22–5.81)
RDW: 14 % (ref 11.5–15.5)
WBC: 14 10*3/uL — AB (ref 4.0–10.5)

## 2015-11-23 LAB — TROPONIN I
Troponin I: 1.34 ng/mL (ref <0.031)
Troponin I: 0.08 ng/mL — ABNORMAL HIGH (ref <0.031)
Troponin I: 0.06 ng/mL — ABNORMAL HIGH (ref <0.031)

## 2015-11-23 MED ORDER — GUAIFENESIN ER 600 MG PO TB12
600.0000 mg | ORAL_TABLET | Freq: Two times a day (BID) | ORAL | Status: DC
  Administered 2015-11-23 – 2015-11-28 (×10): 600 mg via ORAL
  Filled 2015-11-23 (×10): qty 1

## 2015-11-23 MED ORDER — LOSARTAN POTASSIUM 50 MG PO TABS
50.0000 mg | ORAL_TABLET | Freq: Every day | ORAL | Status: DC
  Administered 2015-11-24 – 2015-11-28 (×5): 50 mg via ORAL
  Filled 2015-11-23 (×5): qty 1

## 2015-11-23 MED ORDER — FUROSEMIDE 10 MG/ML IJ SOLN
40.0000 mg | Freq: Once | INTRAMUSCULAR | Status: AC
Start: 2015-11-23 — End: 2015-11-23
  Administered 2015-11-23: 40 mg via INTRAVENOUS
  Filled 2015-11-23: qty 4

## 2015-11-23 MED ORDER — CARVEDILOL 3.125 MG PO TABS
3.1250 mg | ORAL_TABLET | Freq: Two times a day (BID) | ORAL | Status: DC
  Administered 2015-11-23 – 2015-11-27 (×5): 3.125 mg via ORAL
  Filled 2015-11-23 (×9): qty 1

## 2015-11-23 MED ORDER — ASPIRIN 81 MG PO CHEW
81.0000 mg | CHEWABLE_TABLET | Freq: Every day | ORAL | Status: DC
  Administered 2015-11-23 – 2015-11-28 (×5): 81 mg via ORAL
  Filled 2015-11-23 (×6): qty 1

## 2015-11-23 MED ORDER — FUROSEMIDE 10 MG/ML IJ SOLN
40.0000 mg | Freq: Every day | INTRAMUSCULAR | Status: DC
  Administered 2015-11-23 – 2015-11-28 (×6): 40 mg via INTRAVENOUS
  Filled 2015-11-23 (×6): qty 4

## 2015-11-23 MED ORDER — CALCITONIN (SALMON) 200 UNIT/ML IJ SOLN
100.0000 [IU] | Freq: Every day | INTRAMUSCULAR | Status: AC
  Administered 2015-11-23 – 2015-11-25 (×2): 100 [IU] via INTRAMUSCULAR
  Filled 2015-11-23: qty 0.5
  Filled 2015-11-23 (×2): qty 1
  Filled 2015-11-23 (×2): qty 0.5

## 2015-11-23 NOTE — Progress Notes (Signed)
TRH Progress Note                                            Patient Demographics:    Jose [name], is a 82 y.o. male, DOB - 02-15-1931, ZOX:[id number]  Admit date - 11/22/2015   Admitting Physician Ozella Rocks, MD  Outpatient Primary MD for the patient is Carolyne Fiscal, MD  LOS - 1  Outpatient Specialists:   No chief complaint on file.       Subjective:    Jose [name] today has, No headache, No chest pain, No abdominal pain - No Nausea, No new weakness tingling or numbness, No Cough - improved SOB.   Assessment  & Plan :     1.Acute on chronic diastolic heart failure. EF on last echo 2 years ago was preserved. He is better after Lasix which will be continued at a low-dose, fluid and salt restriction, daily weight, monitor intake and output. Repeat echogram cardiology consulted. Placed on Coreg, home dose ARB cut in half to provide room for Coreg.   2. Elevated troponin, will trend, chest pain-free, EKG nonacute, could be mild NSTEMI. We will place on aspirin, Coreg, on xaralto which will be continued, echo to evaluate wall motion and EF, cardiology consulted. He does have history of CAD status post CABG several years ago.   3. Essential hypertension. Added Coreg, ARB cut in half Will monitor. Discontinue Norvasc for now.   4. Paroxysmal atrial fibrillation. [place] vasc 2 score of 4. On xaralto continue, low-dose Coreg. Telemetry monitor. Goal is rate controlled.   5. CKD IV. Baseline creatinine close to 1.4. Will monitor.   6. Moderate hypercalcemia. Check PTH and PTH RP, monitor ionized calcium. Gentle calcitonin 3, Does take  vitamin D at home which will be held.   Code Status : Full  Family Communication  : wife- daughters  Disposition Plan  : home 2-3 days  Barriers For Discharge : CHF  Consults  :  Cards  Procedures  :   TTE  DVT Prophylaxis  :  Xaralto  Lab Results  Component Value Date   PLT 173 11/23/2015    Antibiotics  :     Anti-infectives    None        Objective:   Filed Vitals:   11/22/15 2031 11/22/15 2035 11/23/15 0500 11/23/15 0942  BP: 141/69  104/44 115/61  Pulse: 77  67   Temp: 98.8 F (37.1 C)  98.3 F (36.8 C)   TempSrc:   Oral   Resp: 20  18   Height:      Weight:   76.2 kg (167 lb 15.9 oz)   SpO2: 85% 91% 92%  Wt Readings from Last 3 Encounters:  11/23/15 76.2 kg (167 lb 15.9 oz)  09/01/15 80.74 kg (178 lb)  01/04/15 81.103 kg (178 lb 12.8 oz)     Intake/Output Summary (Last 24 hours) at 11/23/15 1007 Last data filed at 11/23/15 0500  Gross per 24 hour  Intake      0 ml  Output   1800 ml  Net  -1800 ml     Physical Exam  Awake Alert, Oriented X 3, No new F.N deficits, Normal affect [place].AT,PERRAL Supple Neck,No JVD, No cervical lymphadenopathy appriciated.  Symmetrical Chest wall movement, Good air movement bilaterally, few rales RRR,No Gallops,Rubs or new Murmurs, No Parasternal Heave +ve B.Sounds, Abd Soft, No tenderness, No organomegaly appriciated, No rebound - guarding or rigidity. No Cyanosis, Clubbing , trace edema, No new Rash or bruise       Data Review:    CBC  Recent Labs Lab 11/22/15 2034 11/23/15 0415  WBC 16.6* 14.0*  HGB 13.5 12.7*  HCT 39.1 37.7*  PLT 178 173  MCV 88.1 88.7  MCH 30.4 29.9  MCHC 34.5 33.7  RDW 14.0 14.0  LYMPHSABS 1.2  --   MONOABS 0.9  --   EOSABS 0.2  --   BASOSABS 0.0  --     Chemistries   Recent Labs Lab 11/22/15 2034 11/23/15 0415  NA 134* 134*  K 4.5 4.3  CL 100* 100*  CO2 26 27  GLUCOSE 101* 111*  BUN 19 19  CREATININE 1.34* 1.46*  CALCIUM 11.2* 10.9*  MG 1.6*  --     ------------------------------------------------------------------------------------------------------------------ No results for input(s): CHOL, HDL, LDLCALC, TRIG, CHOLHDL, LDLDIRECT in the last 72 hours.  Lab Results  Component Value Date   HGBA1C 6.2* 07/10/2013   ------------------------------------------------------------------------------------------------------------------  Recent Labs  11/22/15 2034  TSH 0.642   ------------------------------------------------------------------------------------------------------------------ No results for input(s): VITAMINB12, FOLATE, FERRITIN, TIBC, IRON, RETICCTPCT in the last 72 hours.  Coagulation profile No results for input(s): INR, PROTIME in the last 168 hours.  No results for input(s): DDIMER in the last 72 hours.  Cardiac Enzymes  Recent Labs Lab 11/22/15 2034 11/23/15 0415  TROPONINI 0.04* 1.34*   ------------------------------------------------------------------------------------------------------------------    Component Value Date/Time   BNP 339.2* 11/22/2015 2034    Inpatient Medications  Scheduled Meds: . aspirin  81 mg Oral Daily  . carvedilol  3.125 mg Oral BID WC  . furosemide  40 mg Intravenous Daily  . [START ON 11/24/2015] losartan  50 mg Oral Daily  . pantoprazole  40 mg Oral Daily  . polyvinyl alcohol  1 drop Both Eyes BID  . pravastatin  80 mg Oral QHS  . Rivaroxaban  15 mg Oral Q supper   Continuous Infusions:  PRN Meds:.albuterol, guaiFENesin-dextromethorphan, [DISCONTINUED] ondansetron **OR** ondansetron (ZOFRAN) IV, polyethylene glycol, traZODone  Micro Results No results found for this or any previous visit (from the past 240 hour(s)).  Radiology Reports Dg Chest 2 View  11/22/2015  CLINICAL DATA:  Followup pneumonia EXAM: CHEST  2 VIEW COMPARISON:  11/08/2015 FINDINGS: Cardiac shadow remains enlarged. Postsurgical changes are again seen. Increased bilateral opacities are noted when  compared with the prior exam. This likely represents a component of superimposed edema over the chronic changes seen previously. No focal confluent infiltrate or effusion is noted. No bony abnormality is seen. IMPRESSION: Diffuse increased opacities bilaterally likely related to pulmonary edema. Electronically Signed   By: Alcide Clever M.D.   On: 11/22/2015 16:10   Dg Chest 2 View  11/08/2015  CLINICAL DATA:  Cough and shortness of breath for 2 weeks. Concern for pneumonia. EXAM: CHEST  2 VIEW COMPARISON:  01/12/2012 FINDINGS: Prior median sternotomy. Patient rotated minimally right on the frontal. Midline trachea. Mild cardiomegaly with transverse aortic atherosclerosis. No pleural effusion or pneumothorax. Patchy interstitial and airspace opacities throughout both lungs. IMPRESSION: Patchy interstitial and airspace opacities, suspicious for multifocal pneumonia. Interstitial pulmonary edema could look similar, but is felt unlikely given absence of pleural fluid. Electronically Signed   By: Jeronimo GreavesKyle  Talbot M.D.   On: 11/08/2015 15:56    Time Spent in minutes  35   Margene Cherian K M.D on 11/23/2015 at 10:07 AM  Between 7am to 7pm - Pager - [phone]  After 7pm go to [email or web address] - password [email or web address]  [facility] -  Office  [phone]

## 2015-11-23 NOTE — Progress Notes (Addendum)
Documented on the wrong patient.   Jose [name], Charlaine DaltonAnn Brooke RN

## 2015-11-23 NOTE — Care Management Note (Signed)
Case Management Note Donn PieriniKristi Zandra Lajeunesse RN, BSN Unit 2W-Case Manager [phone]  Patient Details  Name: Jose BergamoHenry V Kernodle MRN: [id number] Date of Birth: 1931/04/25  Subjective/Objective:     Pt admitted with acute on chronic heart failure               Action/Plan: PTA pt lived at home with wife- independent- anticipate return home- CM to follow for potential d/c needs  Expected Discharge Date:                  Expected Discharge Plan:  Home/Self Care  In-House Referral:     Discharge planning Services  CM Consult  Post Acute Care Choice:    Choice offered to:     DME Arranged:    DME Agency:     HH Arranged:    HH Agency:     Status of Service:  In process, will continue to follow  Medicare Important Message Given:    Date Medicare IM Given:    Medicare IM give by:    Date Additional Medicare IM Given:    Additional Medicare Important Message give by:     If discussed at Long Length of Stay Meetings, dates discussed:    Additional Comments:  Darrold SpanWebster, Katilyn Miltenberger Hall, RN 11/23/2015, 4:42 PM

## 2015-11-23 NOTE — Progress Notes (Signed)
CRITICAL VALUE ALERT  Critical value received:  Troponin 1.34  Date of notification:  11/23/2015  Time of notification:  0555  Critical value read back: Yes  Nurse who received alert:  Gillie Crisci, RN  MD notified (1st page):  Dr. Maryfrances Bunnellanford  Time of first page:  0600  MD notified (2nd page):  Time of second page:  Responding MD:  Maryfrances Bunnellanford  Time MD responded:  [phone]

## 2015-11-23 NOTE — Progress Notes (Signed)
  Echocardiogram 2D Echocardiogram has been performed.  Cathie BeamsGREGORY, Evetta Renner 11/23/2015, 4:54 PM

## 2015-11-23 NOTE — Progress Notes (Signed)
Nutrition Brief Note  Patient identified on the Malnutrition Screening Tool (MST) Report.  Per weight readings below, patient has had a 6% weight loss since January 2017; not significant for time frame.  Wt Readings from Last 15 Encounters:  11/23/15 167 lb 15.9 oz (76.2 kg)  09/01/15 178 lb (80.74 kg)  01/04/15 178 lb 12.8 oz (81.103 kg)  11/11/14 182 lb 1.9 oz (82.609 kg)  07/06/14 183 lb (83.008 kg)  05/21/14 172 lb (78.019 kg)  05/19/14 177 lb (80.287 kg)  04/22/14 177 lb 14.4 oz (80.695 kg)  04/03/14 181 lb (82.101 kg)  01/02/14 181 lb (82.101 kg)  09/17/13 181 lb (82.101 kg)  09/10/13 182 lb (82.555 kg)  07/17/13 178 lb (80.74 kg)  07/09/13 186 lb (84.369 kg)  01/30/13 181 lb (82.101 kg)    Body mass index is 24.8 kg/(m^2). Patient meets criteria for Normal based on current BMI.   Current diet order is Heart Healthy, patient is consuming approximately 100% of meals at this time. Labs and medications reviewed.   No nutrition interventions warranted at this time. If nutrition issues arise, please consult RD.   Maureen ChattersKatie Jaslen Adcox, RD, LDN Pager #: [phone] After-Hours Pager #: [phone]

## 2015-11-23 NOTE — Consult Note (Signed)
Cardiology Consult    Patient ID: Jose [name] MRN: [id number], DOB/AGE: [birth year]   Admit date: 11/22/2015 Date of Consult: 11/23/2015  Primary Physician: Carolyne Fiscal, MD Reason for Consult: Elevated Troponin Primary Cardiologist: Dr. Jens Som Requesting Provider: Dr. Maryfrances Bunnell   History of Present Illness    Jose [name] is a 82 y.o. male with past medical history of CAD (s/p CABG in 2007 w/ LIMA-LAD, SVG-D1, SVG-1st & 2nd Mrg, SVG-PDA and posterior lateral with PFO closure at that time), chronic combined systolic and diastolic CHF (EF 45 - 50% by echo in 2014), CVA (06/2013), HTN, HLD, and  PAF (on Xarelto) who presented to [facility] on 11/22/2015 as a direct admit from his PCP's office for worsening shortness of breath for the past two weeks.  He was seen by his PCP initially and diagnosed with PNA due to his CXR showing patchy interstitial and airspace opacities, suspicious for multifocal pneumonia but noted interstitial pulmonary edema could look similar, but felt unlikely given absence of pleural fluid. However despite Abx treatment, his symptoms continued to worsen.  A repeat CXR was obtained yesterday and showed diffuse increased opacities bilaterally likely related to pulmonary edema. He was therefore directly admitted to the hospital for further evaluation.  While admitted, his BNP is elevated to 339. WBC 16.6. Hgb 13.5. Platelets 178. Creatinine 1.34 (close to baseline). Mg 1.6. TSH 0.642. Cyclic troponin values have been 0.04, 1.34, and 0.08.  He was started on IV Lasix  daily with a net output of 1.8L thus far. Reports significant improvement in his respiratory status. Says his lower extremity edema was only minimal but says that has improved as well. Usually weighs approximately 170 lbs on his home scale but had noticed his weight had trended up to 177 lbs over the past few days. Is at 167 lbs currently.   He denies any recent chest pain or dyspnea with  exertion. Was playing golf 2-3 times a week without symptoms until he developed his productive cough and fatigue two weeks ago.  Was seen by Dr. Jens Som in 08/2015 and doing well at that time. Denied any current anginal symptoms. Last ischemic evaluation was in 04/2014 with a low-risk nuclear stress test at that time.   Past Medical History   Past Medical History  Diagnosis Date  . Hypertension   . Other malaise and fatigue   . Paroxysmal atrial fibrillation (HCC)   . History of embolic stroke no residual    11/ 2014  --  right PCA and MCA branch infarts (left side vision difficulties)  . Bilateral carotid artery stenosis     mild bilateral proximcal ICA  40% per duplex 11/ 2014  . S/P CABG x 6     2007  . GERD (gastroesophageal reflux disease)   . Ureteral obstruction, right   . At risk for sleep apnea     STOP-BANG= 5   SENT TO PCP 05-20-2014  . ED (erectile dysfunction)   . Coronary artery disease     a. s/p CABG in 2007 w/ LIMA-LAD, SVG-D1, SVG-1st & 2nd Mrg, SVG-PDA and posterior lateral  . Eye infection 12/2014    Past Surgical History  Procedure Laterality Date  . Tee without cardioversion N/A 07/17/2013    Procedure: TRANSESOPHAGEAL ECHOCARDIOGRAM (TEE);  Surgeon: Wendall Stade, MD;  Location: [facility] ENDOSCOPY;  Service: Cardiovascular;  Laterality: N/A;  normal LV size, mild prolapse of posterior mitral valve leaflet,  mild MR and TR, normal AV,  no LAA thrombus,  atrial septal aneurysm with no PFO/ ASD and negative bubble study, normal RV, normal aorta with no debris  . Laparoscopic cholecystectomy  10-02-2000  . Coronary artery bypass graft  11-01-2005  dr Cornelius Moras    CLOSURE OF PFO/  LIMA to LAD,  SVG to D1,  SVG to 1st & 2nd  MARGINAL branches, SVG  to PDA and posterior lateral  . Inguinal hernia repair Bilateral   . Cardiac catheterization  10-31-2005  dr wall    severe three vessel/  perserved LV  . Cardiovascular stress test  last one 05-19-2014  dr Jens Som    low risk  lexiscan no exercise study/ small inferobasal infarct with no ischemia /  ef 53%  . Appendectomy  1950's  . Right ureter obstruction surgery  age 77  . Cystoscopy w/ ureteral stent placement Right 05/21/2014    Procedure: CYSTOSCOPY WITH RETROGRADE PYELOGRAM/URETERAL STENT PLACEMENT;  Surgeon: Kathi Ludwig, MD;  Location: [facility];  Service: Urology;  Laterality: Right;  . Cystoscopy with ureteroscopy Right 05/21/2014    Procedure: CYSTOSCOPY WITH URETEROSCOPY;  Surgeon: Kathi Ludwig, MD;  Location: [facility];  Service: Urology;  Laterality: Right;     Allergies Allergies  Allergen Reactions  . Cephalexin Anaphylaxis  . Iodinated Diagnostic Agents Anaphylaxis  . Iohexol Anaphylaxis     THROAT SWELLS AND STOPS BREATHING   . Ace Inhibitors Cough  . Metoprolol Other (See Comments)    Erectile dysfunction  . Viagra [Sildenafil Citrate] Other (See Comments)    Headache and vision changes  . Ciprofloxacin Hives, Itching and Rash  . Codeine Nausea And Vomiting and Other (See Comments)    headaches  . Sulfa Antibiotics Rash    Inpatient Medications    . aspirin  81 mg Oral Daily  . calcitonin  100 Units Intramuscular Daily  . carvedilol  3.125 mg Oral BID WC  . furosemide  40 mg Intravenous Daily  . [START ON 11/24/2015] losartan  50 mg Oral Daily  . pantoprazole  40 mg Oral Daily  . polyvinyl alcohol  1 drop Both Eyes BID  . pravastatin  80 mg Oral QHS  . Rivaroxaban  15 mg Oral Q supper    Family History    Family History  Problem Relation Age of Onset  . Heart disease Mother     PPM  . Hypertension Mother   . Lymphoma Father   . Prostate cancer Father   . Prostate cancer Brother   . Aneurysm Brother     REPAIR  . CAD Brother     CABG  . Other Brother     STENTS IN LEGS  . Other Sister     STOMACH ISSUES  . Depression Sister   . Lung cancer Brother     Asbestosis    Social History    Social History   Social  History  . Marital Status: Married    Spouse Name: Jose [name]  . Number of Children: 4  . Years of Education: college4   Occupational History  . retired    Social History Main Topics  . Smoking status: Former Smoker -- 1.00 packs/day for 35 years    Types: Cigarettes    Quit date: 08/29/1977  . Smokeless tobacco: Never Used  . Alcohol Use: No  . Drug Use: No  . Sexual Activity: Not on file   Other Topics Concern  . Not on file   Social History Narrative  Patient is married with 4 children.   Patient is right handed.   Patient has college education.   Patient drinks decaff coffee.     Review of Systems    General:  No chills, fever, night sweats or weight changes.  Cardiovascular:  No chest pain, palpitations, or paroxysmal nocturnal dyspnea. Positive for dyspnea on exertion and edema. Dermatological: No rash, lesions/masses Respiratory: Positive for productive cough and dyspnea. Urologic: No hematuria, dysuria Abdominal:   No nausea, vomiting, diarrhea, bright red blood per rectum, melena, or hematemesis Neurologic:  No visual changes, wkns, changes in mental status. All other systems reviewed and are otherwise negative except as noted above.  Physical Exam    Blood pressure 118/65, pulse 68, temperature 98.4 F (36.9 C), temperature source Oral, resp. rate 18, height 5\' 9"  (1.753 m), weight 167 lb 15.9 oz (76.2 kg), SpO2 95 %.  General: Pleasant, Caucasian male appearing in NAD. Psych: Normal affect. Neuro: Alert and oriented X 3. Moves all extremities spontaneously. HEENT: Normal  Neck: Supple without bruits or JVD. Lungs:  Resp regular and unlabored, minimal rales at bases bilaterally. Heart: RRR no s3, s4, or murmurs. Abdomen: Soft, non-tender, non-distended, BS + x 4.  Extremities: No clubbing, cyanosis or edema. DP/PT/Radials 2+ and equal bilaterally.  Labs    Troponin (Point of Care Test) No results for input(s): TROPIPOC in the last 72 hours.  Recent  Labs  11/22/15 2034 11/23/15 0415 11/23/15 0939  TROPONINI 0.04* 1.34* 0.08*   Lab Results  Component Value Date   WBC 14.0* 11/23/2015   HGB 12.7* 11/23/2015   HCT 37.7* 11/23/2015   MCV 88.7 11/23/2015   PLT 173 11/23/2015     Recent Labs Lab 11/23/15 0415  NA 134*  K 4.3  CL 100*  CO2 27  BUN 19  CREATININE 1.46*  CALCIUM 10.9*  GLUCOSE 111*   Lab Results  Component Value Date   CHOL 118 04/22/2014   HDL 45 04/22/2014   LDLCALC 57 04/22/2014   TRIG 79 04/22/2014   [facility] results found for: [facility]   Radiology Studies    Dg Chest 2 View: 11/22/2015  CLINICAL DATA:  Followup pneumonia EXAM: CHEST  2 VIEW COMPARISON:  11/08/2015 FINDINGS: Cardiac shadow remains enlarged. Postsurgical changes are again seen. Increased bilateral opacities are noted when compared with the prior exam. This likely represents a component of superimposed edema over the chronic changes seen previously. No focal confluent infiltrate or effusion is noted. No bony abnormality is seen. IMPRESSION: Diffuse increased opacities bilaterally likely related to pulmonary edema. Electronically Signed   By: Alcide CleverMark  Lukens M.D.   On: 11/22/2015 16:10   EKG & Cardiac Imaging    EKG: NSR, HR 74, with occasional PVC's.   Echocardiogram: 06/2013 Study Conclusions:  - Left ventricle: The cavity size was normal. Wall thickness was increased in a pattern of mild LVH. Systolic function was mildly reduced. The estimated ejection fraction was in the range of 45% to 50%. Wall motion was normal; there were no regional wall motion abnormalities. Doppler parameters are consistent with abnormal left ventricular relaxation (grade 1 diastolic dysfunction). - Left atrium: The atrium was mildly dilated.  Impressions: - No cardiac source of emboli was indentified.  Assessment & Plan    1. Acute on Chronic Combined Systolic and Diastolic CHF Exacerbation - echo in 2014 showed mildly reduced EF of 45-50% with  Grade 1 DD (repeat echocardiogram is pending). - was diagnosed with PNA two weeks ago but his  symptoms continued to worsen despite Abx treatment.  - repeat CXR obtained yesterday and showed diffuse increased opacities bilaterally likely related to pulmonary edema.  - BNP mildly elevated to 339.  - started on IV Lasix 40mg  daily with a net output of 1.8L thus far. Reports significant improvement in his respiratory status. Would wean O2 as able.  - baseline weight of 170 lbs with recent gain up to 177 lbs over the past few days. Currently at 167 lbs.  - will give additional dose of IV Lasix this afternoon. He is below baseline weight and his volume status has improved. Will likely be able to switch to PO Lasix tomorrow. Continue to obtain daily weighs and strict I&O's. - continue BB and ARB.  2. Elevated Troponin/ History of CAD - s/p CABG in 2007 w/ LIMA-LAD, SVG-D1, SVG-1st & 2nd Mrg, SVG-PDA and posterior lateral. - denies any recent chest pain. Was very active until he developed PNA two weeks ago.  - cyclic troponin values have been 0.04, 1.34, and 0.08. Discussed with Dr. Clifton James, the 1.34 value is a significant outlier with the two surrounding values being significantly less. Will recheck once more. - If no significant wall motion abnormalities on echo and troponin trending down, would likely not require further ischemic evaluation. - if he developed recurrent dyspnea following improvement in his CHF, could consider [name] outpatient NST. - continue ASA, statin, and BB.  3. HTN - BP has been 104/44 - 149/69 in the past 24 hours. - ARB dose decreased to allow room for BB.  - continue to monitor.  4. HLD - continue statin therapy  5. PAF  - maintaining NSR this admission. - This patients CHA2DS2-VASc Score and unadjusted Ischemic Stroke Rate (% per year) is equal to 11.2 % stroke rate/year from a score of 7 (CHF, HTN, Vascular, Age (2), Stroke (2)). Continue Xarelto for anticoagulation. -  continue BB for rate control.  6. Leukocytosis - WBC elevated to 16.6 on admission. Trending down to 14.0 on 11/23/2015. - main concern at this time is his sinus pressure and productive cough (will add Mucinex to his medication regimen). - per admitting team  7. Stage 3 CKD - creatinine stable at 1.46 on 11/23/2015 (close to baseline).    Signed, Jose Lennox, PA-C 11/23/2015, 1:30 PM Pager: [phone]  I have personally seen and examined this patient with Jose An, PA-C. I agree with the assessment and plan as outlined above. He has known mild LV systolic dysfunction. He is admitted with volume overload. He has responded well to IV Lasix with good diuresis thus far. He only has minimal volume overload on exam at this time. My exam shows normally developed elderly male with RRR, clear lungs, Trace bilateral LE edema, soft abdomen. He is Alert and oriented x 3. Labs reviewed. EKG reviewed and there are no ischemic changes. He has no chest pain. Agree with echo today to define LVEF (last echo 2014). Would repeat troponin as I think the 1.3 was incorrect. Just before that the troponin was 0.04 and just after it was 0.08. It is not typical for a troponin to be elevated at 1.3 and then normal on next check. Would continue IV Lasix tonight and then reassess need for diuresis in am.   Jose [name] 11/23/2015 2:38 PM

## 2015-11-24 ENCOUNTER — Other Ambulatory Visit (HOSPITAL_COMMUNITY): Payer: Medicare Other

## 2015-11-24 ENCOUNTER — Inpatient Hospital Stay (HOSPITAL_COMMUNITY): Payer: Medicare Other

## 2015-11-24 LAB — BASIC METABOLIC PANEL
Anion gap: 9 (ref 5–15)
BUN: 22 mg/dL — AB (ref 6–20)
CO2: 27 mmol/L (ref 22–32)
Calcium: 10.2 mg/dL (ref 8.9–10.3)
Chloride: 97 mmol/L — ABNORMAL LOW (ref 101–111)
Creatinine, Ser: 1.44 mg/dL — ABNORMAL HIGH (ref 0.61–1.24)
GFR calc Af Amer: 51 mL/min — ABNORMAL LOW (ref >60)
GFR, EST NON AFRICAN AMERICAN: 44 mL/min — AB (ref >60)
GLUCOSE: 102 mg/dL — AB (ref 65–99)
POTASSIUM: 3.6 mmol/L (ref 3.5–5.1)
Sodium: 133 mmol/L — ABNORMAL LOW (ref 135–145)

## 2015-11-24 LAB — EXPECTORATED SPUTUM ASSESSMENT W GRAM STAIN, RFLX TO RESP C

## 2015-11-24 LAB — URINALYSIS, ROUTINE W REFLEX MICROSCOPIC
BILIRUBIN URINE: NEGATIVE
GLUCOSE, UA: NEGATIVE mg/dL
KETONES UR: NEGATIVE mg/dL
Leukocytes, UA: NEGATIVE
NITRITE: NEGATIVE
PH: 6.5 (ref 5.0–8.0)
Protein, ur: NEGATIVE mg/dL
Specific Gravity, Urine: 1.012 (ref 1.005–1.030)

## 2015-11-24 LAB — URINE MICROSCOPIC-ADD ON
BACTERIA UA: NONE SEEN
SQUAMOUS EPITHELIAL / LPF: NONE SEEN
WBC UA: NONE SEEN WBC/hpf (ref 0–5)

## 2015-11-24 LAB — CBC
HEMATOCRIT: 37.1 % — AB (ref 39.0–52.0)
Hemoglobin: 12.3 g/dL — ABNORMAL LOW (ref 13.0–17.0)
MCH: 29.4 pg (ref 26.0–34.0)
MCHC: 33.2 g/dL (ref 30.0–36.0)
MCV: 88.8 fL (ref 78.0–100.0)
Platelets: 192 10*3/uL (ref 150–400)
RBC: 4.18 MIL/uL — ABNORMAL LOW (ref 4.22–5.81)
RDW: 13.9 % (ref 11.5–15.5)
WBC: 13.5 10*3/uL — ABNORMAL HIGH (ref 4.0–10.5)

## 2015-11-24 LAB — EXPECTORATED SPUTUM ASSESSMENT W REFEX TO RESP CULTURE

## 2015-11-24 LAB — PARATHYROID HORMONE, INTACT (NO CA): PTH: 6 pg/mL — AB (ref 15–65)

## 2015-11-24 MED ORDER — SODIUM CHLORIDE 0.9 % IV SOLN
3.0000 g | Freq: Four times a day (QID) | INTRAVENOUS | Status: DC
  Administered 2015-11-24 – 2015-11-27 (×13): 3 g via INTRAVENOUS
  Filled 2015-11-24 (×17): qty 3

## 2015-11-24 MED ORDER — DEXTROSE 5 % IV SOLN
500.0000 mg | INTRAVENOUS | Status: AC
  Administered 2015-11-24 – 2015-11-28 (×5): 500 mg via INTRAVENOUS
  Filled 2015-11-24 (×7): qty 500

## 2015-11-24 NOTE — Research (Signed)
[email or web address] Informed Consent   Subject Name: Jose [name]  Subject met inclusion and exclusion criteria.  The informed consent form, study requirements and expectations were reviewed with the subject and questions and concerns were addressed prior to the signing of the consent form.  The subject verbalized understanding of the trail requirements.  The subject agreed to participate in the [email or web address] trial and signed the informed consent.  The informed consent was obtained prior to performance of any protocol-specific procedures for the subject.  A copy of the signed informed consent was given to the subject and a copy was placed in the subject's medical record.  Vivian Garman 11/24/2015, 10:46 AM  

## 2015-11-24 NOTE — Progress Notes (Addendum)
TRH Progress Note                                            Patient Demographics:    Jose [name], is a 82 y.o. male, DOB - 1931-04-29, ZOX:[id number]  Admit date - 11/22/2015   Admitting Physician Ozella Rocks, MD  Outpatient Primary MD for the patient is Carolyne Fiscal, MD  LOS - 2  Outpatient Specialists:   No chief complaint on file.       Subjective:    Jose [name] today has, No headache, No chest pain, No abdominal pain - No Nausea, No new weakness tingling or numbness, Has developed a productive Cough - improved SOB but still feels short of breath on exertion.   Assessment  & Plan :     1. Acute on chronic diastolic heart failure. EF 60%. He is better after Lasix which will be continued at a low-dose, fluid and salt restriction, daily weight, monitor intake and output. So far he is 3 L negative, Repeat echogram noted with grade 1 diastolic dysfunction and preserved EF of close to 60%, cardiology has been consulted. Placed on Coreg, home dose ARB cut in half to provide room for Coreg. Continue Lasix and monitor.   2. Possible partially treated CAP. He has finished Levaquin regimen outpatient however continues to have a productive cough which is now yellowish-green, does have mild leukocytosis and does have shortness of breath. Will order sputum Gram stain culture, he is afebrile, if runs a fever we will obtain blood cultures, will place him on azithromycin and Unasyn and monitor. Added flutter valve and I asked. Monitor clinically.   3. Elevated troponin, will trend, chest pain-free, EKG nonacute, could be mild NSTEMI. We will place on aspirin, Coreg, on xaralto which will be continued, echo to evaluate wall motion and EF, cardiology consulted.  He does have history of CAD status post CABG several years ago.   4. Essential hypertension. Added Coreg, ARB cut in half Will monitor. Discontinue Norvasc for now.   5. Paroxysmal atrial fibrillation. [place] vasc 2 score of 4. On xaralto continue, low-dose Coreg. Telemetry monitor. Goal is rate controlled.   6. CKD IV. Baseline creatinine close to 1.4. Will monitor.   7. Moderate hypercalcemia. Check PTH and PTH RP, monitor ionized calcium. [place] calcitonin 3, Does take vitamin D at home which will be held. Calcium now is normal. Stop vitamin D upon discharge with outpatient follow-up with PCP.   Code Status : Full  Family Communication  : wife- daughters  Disposition Plan  : home 2-3 days  Barriers For Discharge : CHF  Consults  :  Cards  Procedures  :   TTE -   Left ventricle: The cavity size was normal. There was mild concentric hypertrophy. Systolic function was normal. The estimated ejection fraction was in the range of 55% to 60%. Wall motion was normal; there were no regional wall motion abnormalities. Doppler parameters are consistent with abnormal  left ventricular relaxation (grade 1 diastolic dysfunction). - Aortic valve: Transvalvular velocity was within the normal range. There was no stenosis. There was no regurgitation. - Mitral valve: Calcified annulus. There was trivial regurgitation. - Left atrium: The atrium was moderately dilated. - Right ventricle: The cavity size was normal. Wall thickness was normal. Systolic function was mildly reduced.   Atrial septum: No defect or patent foramen  ovale was identified  by color flow Doppler. - Tricuspid valve: There was mild regurgitation. - Pulmonary arteries: Systolic pressure was within the normal range. PA peak pressure: 32 mm Hg (S). - Inferior vena cava: The vessel was normal in size. The respirophasic diameter changes were in the normal range (>= 50%), consistent with normal central venous pressure.    DVT  Prophylaxis  :  Xaralto  Lab Results  Component Value Date   PLT 192 11/24/2015    Antibiotics  :     Anti-infectives    Start     Dose/Rate Route Frequency Ordered Stop   11/24/15 1100  Ampicillin-Sulbactam (UNASYN) 3 g in sodium chloride 0.9 % 100 mL IVPB     3 g 100 mL/hr over 60 Minutes Intravenous Every 6 hours 11/24/15 0909     11/24/15 1000  azithromycin (ZITHROMAX) 500 mg in dextrose 5 % 250 mL IVPB     500 mg 250 mL/hr over 60 Minutes Intravenous Every 24 hours 11/24/15 0855          Objective:   Filed Vitals:   11/23/15 1712 11/23/15 2144 11/24/15 0432 11/24/15 0754  BP: 119/60 110/42 105/51   Pulse: 64 65 74 60  Temp:  98.6 F (37 C) 98.8 F (37.1 C)   TempSrc:  Oral Oral   Resp:  20 18   Height:      Weight:   75.206 kg (165 lb 12.8 oz)   SpO2:  93% 92%     Wt Readings from Last 3 Encounters:  11/24/15 75.206 kg (165 lb 12.8 oz)  09/01/15 80.74 kg (178 lb)  01/04/15 81.103 kg (178 lb 12.8 oz)     Intake/Output Summary (Last 24 hours) at 11/24/15 1118 Last data filed at 11/24/15 0730  Gross per 24 hour  Intake   1200 ml  Output   2825 ml  Net  -1625 ml     Physical Exam  Awake Alert, Oriented X 3, No new F.N deficits, Normal affect [place].AT,PERRAL Supple Neck,No JVD, No cervical lymphadenopathy appriciated.  Symmetrical Chest wall movement, Good air movement bilaterally, few rales RRR,No Gallops,Rubs or new Murmurs, No Parasternal Heave +ve B.Sounds, Abd Soft, No tenderness, No organomegaly appriciated, No rebound - guarding or rigidity. No Cyanosis, Clubbing , trace edema, No new Rash or bruise       Data Review:    CBC  Recent Labs Lab 11/22/15 2034 11/23/15 0415 11/24/15 0650  WBC 16.6* 14.0* 13.5*  HGB 13.5 12.7* 12.3*  HCT 39.1 37.7* 37.1*  PLT 178 173 192  MCV 88.1 88.7 88.8  MCH 30.4 29.9 29.4  MCHC 34.5 33.7 33.2  RDW 14.0 14.0 13.9  LYMPHSABS 1.2  --   --   MONOABS 0.9  --   --   EOSABS 0.2  --   --   BASOSABS 0.0   --   --     Chemistries   Recent Labs Lab 11/22/15 2034 11/23/15 0415 11/24/15 0320  NA 134* 134* 133*  K 4.5 4.3 3.6  CL 100* 100* 97*  CO2 26 27 27   GLUCOSE 101* 111* 102*  BUN 19 19 22*  CREATININE 1.34* 1.46* 1.44*  CALCIUM 11.2* 10.9* 10.2  MG 1.6*  --   --    ------------------------------------------------------------------------------------------------------------------ No results for input(s): CHOL, HDL, LDLCALC, TRIG, CHOLHDL, LDLDIRECT in the last 72 hours.  Lab Results  Component Value Date   HGBA1C 6.2* 07/10/2013   ------------------------------------------------------------------------------------------------------------------  Recent Labs  11/22/15  2034  TSH 0.642   ------------------------------------------------------------------------------------------------------------------ No results for input(s): VITAMINB12, FOLATE, FERRITIN, TIBC, IRON, RETICCTPCT in the last 72 hours.  Coagulation profile No results for input(s): INR, PROTIME in the last 168 hours.  No results for input(s): DDIMER in the last 72 hours.  Cardiac Enzymes  Recent Labs Lab 11/23/15 0415 11/23/15 0939 11/23/15 1423  TROPONINI 1.34* 0.08* 0.06*   ------------------------------------------------------------------------------------------------------------------    Component Value Date/Time   BNP 339.2* 11/22/2015 2034    Inpatient Medications  Scheduled Meds: . ampicillin-sulbactam (UNASYN) IV  3 g Intravenous Q6H  . aspirin  81 mg Oral Daily  . azithromycin  500 mg Intravenous Q24H  . calcitonin  100 Units Intramuscular Daily  . carvedilol  3.125 mg Oral BID WC  . furosemide  40 mg Intravenous Daily  . guaiFENesin  600 mg Oral BID  . losartan  50 mg Oral Daily  . pantoprazole  40 mg Oral Daily  . polyvinyl alcohol  1 drop Both Eyes BID  . pravastatin  80 mg Oral QHS  . Rivaroxaban  15 mg Oral Q supper   Continuous Infusions:  PRN Meds:.albuterol,  guaiFENesin-dextromethorphan, [DISCONTINUED] ondansetron **OR** ondansetron (ZOFRAN) IV, polyethylene glycol, traZODone  Micro Results No results found for this or any previous visit (from the past 240 hour(s)).  Radiology Reports Dg Chest 2 View  11/22/2015  CLINICAL DATA:  Followup pneumonia EXAM: CHEST  2 VIEW COMPARISON:  11/08/2015 FINDINGS: Cardiac shadow remains enlarged. Postsurgical changes are again seen. Increased bilateral opacities are noted when compared with the prior exam. This likely represents a component of superimposed edema over the chronic changes seen previously. No focal confluent infiltrate or effusion is noted. No bony abnormality is seen. IMPRESSION: Diffuse increased opacities bilaterally likely related to pulmonary edema. Electronically Signed   By: Alcide Clever M.D.   On: 11/22/2015 16:10   Dg Chest 2 View  11/08/2015  CLINICAL DATA:  Cough and shortness of breath for 2 weeks. Concern for pneumonia. EXAM: CHEST  2 VIEW COMPARISON:  01/12/2012 FINDINGS: Prior median sternotomy. Patient rotated minimally right on the frontal. Midline trachea. Mild cardiomegaly with transverse aortic atherosclerosis. No pleural effusion or pneumothorax. Patchy interstitial and airspace opacities throughout both lungs. IMPRESSION: Patchy interstitial and airspace opacities, suspicious for multifocal pneumonia. Interstitial pulmonary edema could look similar, but is felt unlikely given absence of pleural fluid. Electronically Signed   By: Jeronimo Greaves M.D.   On: 11/08/2015 15:56   Dg Chest Port 1 View  11/24/2015  CLINICAL DATA:  Shortness of breath.  Cough. EXAM: PORTABLE CHEST 1 VIEW COMPARISON:  11/22/2015 FINDINGS: There are diffuse reticular densities throughout both lungs. No focal confluent opacities. Heart size is within normal limits and stable. Again noted are median sternotomy wires and evidence of prior CABG procedure. Atherosclerotic calcifications at the aortic arch. Negative for a  pneumothorax. IMPRESSION: Diffuse reticular lung densities. These findings likely represent pulmonary edema. Atypical infectious process would also be in the differential diagnosis. Electronically Signed   By: Richarda Overlie M.D.   On: 11/24/2015 07:16    Time Spent in minutes  35   Samiha Denapoli K M.D on 11/24/2015 at 11:18 AM  Between 7am to 7pm - Pager - [phone]  After 7pm go to [email or web address] - password [facility]  [facility] -  Office  [phone]

## 2015-11-24 NOTE — Progress Notes (Signed)
SUBJECTIVE: Still with dyspnea. No chest pain.   Tele: sinus  BP 105/51 mmHg  Pulse 60  Temp(Src) 98.8 F (37.1 C) (Oral)  Resp 18  Ht 5\' 9"  (1.753 m)  Wt 165 lb 12.8 oz (75.206 kg)  BMI 24.47 kg/m2  SpO2 92%  Intake/Output Summary (Last 24 hours) at 11/24/15 1056 Last data filed at 11/24/15 0730  Gross per 24 hour  Intake   1200 ml  Output   2825 ml  Net  -1625 ml    PHYSICAL EXAM General: Well developed, well nourished, in no acute distress. Alert and oriented x 3.  Psych:  Good affect, responds appropriately Neck: No JVD. No masses noted.  Lungs: Clear bilaterally with no wheezes or rhonci noted.  Heart: RRR with no murmurs noted. Abdomen: Bowel sounds are present. Soft, non-tender.  Extremities: No lower extremity edema.   LABS: Basic Metabolic Panel:  Recent Labs  14/78/2903/27/17 2034 11/23/15 0415 11/24/15 0320  NA 134* 134* 133*  K 4.5 4.3 3.6  CL 100* 100* 97*  CO2 26 27 27   GLUCOSE 101* 111* 102*  BUN 19 19 22*  CREATININE 1.34* 1.46* 1.44*  CALCIUM 11.2* 10.9* 10.2  MG 1.6*  --   --    CBC:  Recent Labs  11/22/15 2034 11/23/15 0415 11/24/15 0650  WBC 16.6* 14.0* 13.5*  NEUTROABS 14.3*  --   --   HGB 13.5 12.7* 12.3*  HCT 39.1 37.7* 37.1*  MCV 88.1 88.7 88.8  PLT 178 173 192   Cardiac Enzymes:  Recent Labs  11/23/15 0415 11/23/15 0939 11/23/15 1423  TROPONINI 1.34* 0.08* 0.06*    Current Meds: . ampicillin-sulbactam (UNASYN) IV  3 g Intravenous Q6H  . aspirin  81 mg Oral Daily  . azithromycin  500 mg Intravenous Q24H  . calcitonin  100 Units Intramuscular Daily  . carvedilol  3.125 mg Oral BID WC  . furosemide  40 mg Intravenous Daily  . guaiFENesin  600 mg Oral BID  . losartan  50 mg Oral Daily  . pantoprazole  40 mg Oral Daily  . polyvinyl alcohol  1 drop Both Eyes BID  . pravastatin  80 mg Oral QHS  . Rivaroxaban  15 mg Oral Q supper   Echo 11/23/15: Left ventricle: The cavity size was normal. There was mild   concentric hypertrophy. Systolic function was normal. The  estimated ejection fraction was in the range of 55% to 60%. Wall  motion was normal; there were no regional wall motion  abnormalities. Doppler parameters are consistent with abnormal  left ventricular relaxation (grade 1 diastolic dysfunction). - Aortic valve: Transvalvular velocity was within the normal range.  There was no stenosis. There was no regurgitation. - Mitral valve: Calcified annulus. There was trivial regurgitation. - Left atrium: The atrium was moderately dilated. - Right ventricle: The cavity size was normal. Wall thickness was  normal. Systolic function was mildly reduced. - Atrial septum: No defect or patent foramen ovale was identified  by color flow Doppler. - Tricuspid valve: There was mild regurgitation. - Pulmonary arteries: Systolic pressure was within the normal  range. PA peak pressure: 32 mm Hg (S). - Inferior vena cava: The vessel was normal in size. The  respirophasic diameter changes were in the normal range (>= 50%),  consistent with normal central venous pressure.  ASSESSMENT AND PLAN:  1. Acute on Chronic Combined Systolic and Diastolic CHF Exacerbation: Echo with normal LV function. Treatment for possible PNA 2 weeks  ago. Repeat CXR 11/24/15 showed diffuse increased opacities bilaterally likely related to pulmonary edema. He has diuresed well with IV Lasix ( 1.6 Liters last 24 hours) but still dyspneic. I would continue IV Lasix today. He is being started on abx by primary team. Continue beta blocker and ARB.   2. Elevated Troponin/ History of CAD: He has no chest pain. The troponin value that was elevated is likely an error as all other values were normal.   3. PAF: Maintaining NSR this admission. This patients CHA2DS2-VASc Score and unadjusted Ischemic Stroke Rate (% per year) is equal to 11.2 % stroke rate/year from a score of 7 (CHF, HTN, Vascular, Age (2), Stroke (2)). Continue Xarelto  for anticoagulation. Continue BB for rate control.  MCALHANY,CHRISTOPHER  3/29/201710:56 AM

## 2015-11-24 NOTE — Progress Notes (Signed)
ANTIBIOTIC CONSULT NOTE - INITIAL  Pharmacy Consult for Unasyn Indication: pneumonia  Allergies  Allergen Reactions  . Cephalexin Anaphylaxis    Pt says he has tolerated PCN in the past  . Iodinated Diagnostic Agents Anaphylaxis  . Iohexol Anaphylaxis     THROAT SWELLS AND STOPS BREATHING   . Ace Inhibitors Cough  . Metoprolol Other (See Comments)    Erectile dysfunction  . Viagra [Sildenafil Citrate] Other (See Comments)    Headache and vision changes  . Ciprofloxacin Hives, Itching and Rash  . Codeine Nausea And Vomiting and Other (See Comments)    headaches  . Sulfa Antibiotics Rash    Patient Measurements: Height: 5\' 9"  (175.3 cm) Weight: 165 lb 12.8 oz (75.206 kg) IBW/kg (Calculated) : 70.7 Adjusted Body Weight:    Vital Signs: Temp: 98.8 F (37.1 C) (03/29 0432) Temp Source: Oral (03/29 0432) BP: 105/51 mmHg (03/29 0432) Pulse Rate: 60 (03/29 0754) Intake/Output from previous day: 03/28 0701 - 03/29 0700 In: 1200 [P.O.:1200] Out: 2175 [Urine:2175] Intake/Output from this shift: Total I/O In: 240 [P.O.:240] Out: 650 [Urine:650]  Labs:  Recent Labs  11/22/15 2034 11/23/15 0415 11/24/15 0320 11/24/15 0650  WBC 16.6* 14.0*  --  13.5*  HGB 13.5 12.7*  --  12.3*  PLT 178 173  --  192  CREATININE 1.34* 1.46* 1.44*  --    Estimated Creatinine Clearance: 39.6 mL/min (by C-G formula based on Cr of 1.44). No results for input(s): VANCOTROUGH, VANCOPEAK, VANCORANDOM, GENTTROUGH, GENTPEAK, GENTRANDOM, TOBRATROUGH, TOBRAPEAK, TOBRARND, AMIKACINPEAK, AMIKACINTROU, AMIKACIN in the last 72 hours.   Microbiology: No results found for this or any previous visit (from the past 720 hour(s)).  Medical History: Past Medical History  Diagnosis Date  . Hypertension   . Other malaise and fatigue   . Paroxysmal atrial fibrillation (HCC)   . History of embolic stroke no residual    11/ 2014  --  right PCA and MCA branch infarts (left side vision difficulties)  .  Bilateral carotid artery stenosis     mild bilateral proximcal ICA  40% per duplex 11/ 2014  . S/P CABG x 6     2007  . GERD (gastroesophageal reflux disease)   . Ureteral obstruction, right   . At risk for sleep apnea     STOP-BANG= 5   SENT TO PCP 05-20-2014  . ED (erectile dysfunction)   . Coronary artery disease     a. s/p CABG in 2007 w/ LIMA-LAD, SVG-D1, SVG-1st & 2nd Mrg, SVG-PDA and posterior lateral  . Eye infection 12/2014    Assessment: 82 y/o M admitted with DOE x 2 wks>>CHF Was treated in the last 2 wks by PCP for PNA with Levaquin with no improvement.   PMH: CAD s/p CABG, pAF on Xarelto, HTN and CHF EF 45% (2014) -Baseline weight 172 lbs.  Anticoag: PAF. CHADs2Vasc 4.  On Xarelto.  In sinus rhythm presently. CBC wNL.  ID: Change to Unasyn/Azithro for worsening PNA with productive cough. WBC 13.5 remains elevated. Afebrile. CrCl 39  Levaquin: Outpatient Azithro 3/29>> Unasyn 3/29>>  Goal of Therapy:  Eradication of infection   Plan:  Xarelto 15mg /d adjusted for renal function Start Unasyn 3g IV q6hr. Monitor for any allergic reaction. MD aware of allergy history   Jose [name], Jose [name], Jose [name] Clinical Staff Pharmacist Pager [phone]  Misty Stanleyobertson, Rmani Kellogg Stillinger 11/24/2015,9:13 AM

## 2015-11-24 NOTE — Discharge Instructions (Addendum)

## 2015-11-25 DIAGNOSIS — J181 Lobar pneumonia, unspecified organism: Secondary | ICD-10-CM | POA: Diagnosis present

## 2015-11-25 DIAGNOSIS — I5033 Acute on chronic diastolic (congestive) heart failure: Secondary | ICD-10-CM | POA: Diagnosis present

## 2015-11-25 LAB — URINE CULTURE

## 2015-11-25 LAB — BASIC METABOLIC PANEL
ANION GAP: 9 (ref 5–15)
BUN: 22 mg/dL — ABNORMAL HIGH (ref 6–20)
CALCIUM: 10.8 mg/dL — AB (ref 8.9–10.3)
CHLORIDE: 96 mmol/L — AB (ref 101–111)
CO2: 29 mmol/L (ref 22–32)
CREATININE: 1.44 mg/dL — AB (ref 0.61–1.24)
GFR calc non Af Amer: 44 mL/min — ABNORMAL LOW (ref >60)
GFR, EST AFRICAN AMERICAN: 51 mL/min — AB (ref >60)
Glucose, Bld: 112 mg/dL — ABNORMAL HIGH (ref 65–99)
Potassium: 3.9 mmol/L (ref 3.5–5.1)
SODIUM: 134 mmol/L — AB (ref 135–145)

## 2015-11-25 LAB — CBC
HCT: 38.6 % — ABNORMAL LOW (ref 39.0–52.0)
HEMOGLOBIN: 13 g/dL (ref 13.0–17.0)
MCH: 29.8 pg (ref 26.0–34.0)
MCHC: 33.7 g/dL (ref 30.0–36.0)
MCV: 88.5 fL (ref 78.0–100.0)
PLATELETS: 196 10*3/uL (ref 150–400)
RBC: 4.36 MIL/uL (ref 4.22–5.81)
RDW: 13.9 % (ref 11.5–15.5)
WBC: 13.8 10*3/uL — AB (ref 4.0–10.5)

## 2015-11-25 LAB — CALCIUM, IONIZED: Calcium, Ionized, Serum: 6.3 mg/dL — ABNORMAL HIGH (ref 4.5–5.6)

## 2015-11-25 NOTE — Care Management Important Message (Signed)
Important Message  Patient Details  Name: Jose [name] MRN: [id number] Date of Birth: [birth year]   Medicare Important Message Given:  Yes    Kyla BalzarineShealy, Levette Paulick Abena 11/25/2015, 1:56 PM

## 2015-11-25 NOTE — Progress Notes (Signed)
SATURATION QUALIFICATIONS: (This note is used to comply with regulatory documentation for home oxygen)  Patient Saturations on Room Air at Rest = 88%  Patient Saturations on Room Air while Ambulating = 81%  Patient Saturations on 6 Liters of oxygen while Ambulating = 96%  Please briefly explain why patient needs home oxygen:

## 2015-11-25 NOTE — Progress Notes (Signed)
[facility] PROGRESS NOTE  AMERICO VALLERY ZOX:[id number] DOB: March 06, 1931 DOA: 11/22/2015 PCP: Carolyne Fiscal, MD   Brief narrative 82 year old male with history of chronic combined systolic and diastolic CHF, CAD status post CABG, paroxysmal A. fib on Xarelto CKD stage IV, hypertension presented with progressive dyspnea for 2 weeks duration. Patient admitted for acute on chronic CHF.  Assessment/Plan: Acute on chronic diastolic CHF Clinically improving. On IV Lasix.Marland Kitchen Has diuresed about 4.2 L since admission. Feels about 80% of his baseline. Still feels winded on ambulating and has nonproductive cough. -Appreciate cardiology evaluation. 2-D echo shows improved EF of 50 to have and 60% with diastolic dysfunction. Continue aspirin, losartan and Coreg. Continue statin. Next line per his A. fib Rate controlled. Continue Xarelto.  Paroxysmal A. fib Rate controlled. Continue beta blocker and Xarelto.   Elevated troponin with history of CAD s/p CABG. Troponin peaked at 1.34. No chest pain symptoms. EKG without ST T changes. Echo with normal and no wall motion abnormality. Cardiogenic recommend current management.  Continue aspirin, beta blocker and statin.  Chronic kidney disease stage IV Area function at baseline. Continue to monitor.  Possible pneumonia On empiric Unasyn and azithromycin.  Moderate hypercalcemia Low PTH. Follow PTH related peptide and ionized calcium. Vitamin D supplement held.  Levator troponin  DVT prophylaxis: On Xarelto   Diet: Heart healthy    Code Status: Full code Family Communication: Wife At bedside Disposition Plan: Continue IV Lasix and IV antibiotics for today. Ambulate patient check for home O2 needs. Possibly discharge in a.m. if improving   Consultants:  Cardiology   Procedures: 2-D echo  Antibiotics:  IV Unasyn and azithromycin  HPI/Subjective: Seen and examined. Reports his breathing to be much better but still not back  to baseline. Still gets winded on walking to the bathroom and having nonproductive cough.  Objective: Filed Vitals:   11/24/15 2143 11/25/15 0515  BP: 113/59 118/55  Pulse: 63 63  Temp: 97.7 F (36.5 C) 99.5 F (37.5 C)  Resp: 18     Intake/Output Summary (Last 24 hours) at 11/25/15 1337 Last data filed at 11/25/15 0800  Gross per 24 hour  Intake    240 ml  Output   1350 ml  Net  -1110 ml   Filed Weights   11/23/15 0500 11/24/15 0432 11/25/15 0515  Weight: 76.2 kg (167 lb 15.9 oz) 75.206 kg (165 lb 12.8 oz) 75.796 kg (167 lb 1.6 oz)    Exam:   General:  Elderly male not in distress  HEENT: No pallor, moist mucosa, no JVD, supple neck  Cardiovascular: S1 and S2 irregularly irregular, no murmurs  Respiratory: Clear bilaterally, no added sounds  GI: Soft, nondistended, nontender  Musculoskeletal: Warm, no edema  CNS: Alert and oriented  Data Reviewed: Basic Metabolic Panel:  Recent Labs Lab 11/22/15 2034 11/23/15 0415 11/24/15 0320 11/25/15 0320  NA 134* 134* 133* 134*  K 4.5 4.3 3.6 3.9  CL 100* 100* 97* 96*  CO2 GLUCOSE 101* 111* 102* 112*  BUN 19 19 22* 22*  CREATININE 1.34* 1.46* 1.44* 1.44*  CALCIUM 11.2* 10.9* 10.2 10.8*  MG 1.6*  --   --   --    Liver Function Tests: No results for input(s): AST, ALT, ALKPHOS, BILITOT, PROT, ALBUMIN in the last 168 hours. No results for input(s): LIPASE, AMYLASE in the last 168 hours. No results for input(s): AMMONIA in the last 168 hours. CBC:  Recent Labs Lab 11/22/15 2034 11/23/15 0415  11/24/15 0650 11/25/15 0320  WBC 16.6* 14.0* 13.5* 13.8*  NEUTROABS 14.3*  --   --   --   HGB 13.5 12.7* 12.3* 13.0  HCT 39.1 37.7* 37.1* 38.6*  MCV 88.1 88.7 88.8 88.5  PLT 178 173 192 196   Cardiac Enzymes:  Recent Labs Lab 11/22/15 2034 11/23/15 0415 11/23/15 0939 11/23/15 1423  TROPONINI 0.04* 1.34* 0.08* 0.06*   BNP (last 3 results)  Recent Labs  11/22/15 2034  BNP 339.2*     ProBNP (last 3 results) No results for input(s): PROBNP in the last 8760 hours.  CBG: No results for input(s): GLUCAP in the last 168 hours.  Recent Results (from the past 240 hour(s))  Culture, expectorated sputum-assessment     Status: None   Collection Time: 11/24/15 11:07 AM  Result Value Ref Range Status   Specimen Description SPUTUM  Final   Special Requests NONE  Final   Sputum evaluation   Final    MICROSCOPIC FINDINGS SUGGEST THAT THIS SPECIMEN IS NOT REPRESENTATIVE OF LOWER RESPIRATORY SECRETIONS. PLEASE RECOLLECT. Gram Stain Report Called to,Read Back By and Verified With: T Oletha BlendMAINIERO,RN AT 1212 11/24/15 BY L BENFIELD    Report Status 11/24/2015 FINAL  Final  Culture, expectorated sputum-assessment     Status: None   Collection Time: 11/24/15  9:19 PM  Result Value Ref Range Status   Specimen Description SPUTUM  Final   Special Requests NONE  Final   Sputum evaluation   Final    THIS SPECIMEN IS ACCEPTABLE. RESPIRATORY CULTURE REPORT TO FOLLOW.   Report Status 11/24/2015 FINAL  Final     Studies: Dg Chest Port 1 View  11/24/2015  CLINICAL DATA:  Shortness of breath.  Cough. EXAM: PORTABLE CHEST 1 VIEW COMPARISON:  11/22/2015 FINDINGS: There are diffuse reticular densities throughout both lungs. No focal confluent opacities. Heart size is within normal limits and stable. Again noted are median sternotomy wires and evidence of prior CABG procedure. Atherosclerotic calcifications at the aortic arch. Negative for a pneumothorax. IMPRESSION: Diffuse reticular lung densities. These findings likely represent pulmonary edema. Atypical infectious process would also be in the differential diagnosis. Electronically Signed   By: Richarda OverlieAdam  Henn M.D.   On: 11/24/2015 07:16    Scheduled Meds: . ampicillin-sulbactam (UNASYN) IV  3 g Intravenous Q6H  . aspirin  81 mg Oral Daily  . azithromycin  500 mg Intravenous Q24H  . calcitonin  100 Units Intramuscular Daily  . carvedilol  3.125 mg  Oral BID WC  . furosemide  40 mg Intravenous Daily  . guaiFENesin  600 mg Oral BID  . losartan  50 mg Oral Daily  . pantoprazole  40 mg Oral Daily  . polyvinyl alcohol  1 drop Both Eyes BID  . pravastatin  80 mg Oral QHS  . Rivaroxaban  15 mg Oral Q supper   Continuous Infusions:     Time spent: 25 minutes    Davelle Anselmi  [facility] Pager [phone]. If 7PM-7AM, please contact night-coverage at [email or web address], password [email or web address] 11/25/2015, 1:37 PM  LOS: 3 days

## 2015-11-25 NOTE — Progress Notes (Signed)
SUBJECTIVE: Feels much better. No dyspnea or chest pain.   BP 118/55 mmHg  Pulse 63  Temp(Src) 99.5 F (37.5 C) (Oral)  Resp 18  Ht 5\' 9"  (1.753 m)  Wt 167 lb 1.6 oz (75.796 kg)  BMI 24.67 kg/m2  SpO2 94%  Intake/Output Summary (Last 24 hours) at 11/25/15 [id number] Last data filed at 11/24/15 2142  Gross per 24 hour  Intake    720 ml  Output   2200 ml  Net  -1480 ml    PHYSICAL EXAM General: Well developed, well nourished, in no acute distress. Alert and oriented x 3.  Psych:  Good affect, responds appropriately Neck: No JVD. No masses noted.  Lungs: Clear bilaterally with no wheezes or rhonci noted.  Heart: RRR with no murmurs noted. Abdomen: Bowel sounds are present. Soft, non-tender.  Extremities: No lower extremity edema.   LABS: Basic Metabolic Panel:  Recent Labs  75/64/3303/27/17 2034  11/24/15 0320 11/25/15 0320  NA 134*  < > 133* 134*  K 4.5  < > 3.6 3.9  CL 100*  < > 97* 96*  CO2 26  < > 27 29  GLUCOSE 101*  < > 102* 112*  BUN 19  < > 22* 22*  CREATININE 1.34*  < > 1.44* 1.44*  CALCIUM 11.2*  < > 10.2 10.8*  MG 1.6*  --   --   --   < > = values in this interval not displayed. CBC:  Recent Labs  11/22/15 2034  11/24/15 0650 11/25/15 0320  WBC 16.6*  < > 13.5* 13.8*  NEUTROABS 14.3*  --   --   --   HGB 13.5  < > 12.3* 13.0  HCT 39.1  < > 37.1* 38.6*  MCV 88.1  < > 88.8 88.5  PLT 178  < > 192 196  < > = values in this interval not displayed. Cardiac Enzymes:  Recent Labs  11/23/15 0415 11/23/15 0939 11/23/15 1423  TROPONINI 1.34* 0.08* 0.06*    Current Meds: . ampicillin-sulbactam (UNASYN) IV  3 g Intravenous Q6H  . aspirin  81 mg Oral Daily  . azithromycin  500 mg Intravenous Q24H  . calcitonin  100 Units Intramuscular Daily  . carvedilol  3.125 mg Oral BID WC  . furosemide  40 mg Intravenous Daily  . guaiFENesin  600 mg Oral BID  . losartan  50 mg Oral Daily  . pantoprazole  40 mg Oral Daily  . polyvinyl alcohol  1 drop Both Eyes  BID  . pravastatin  80 mg Oral QHS  . Rivaroxaban  15 mg Oral Q supper    Echo 11/23/15: Left ventricle: The cavity size was normal. There was mild  concentric hypertrophy. Systolic function was normal. The  estimated ejection fraction was in the range of 55% to 60%. Wall  motion was normal; there were no regional wall motion  abnormalities. Doppler parameters are consistent with abnormal  left ventricular relaxation (grade 1 diastolic dysfunction). - Aortic valve: Transvalvular velocity was within the normal range.  There was no stenosis. There was no regurgitation. - Mitral valve: Calcified annulus. There was trivial regurgitation. - Left atrium: The atrium was moderately dilated. - Right ventricle: The cavity size was normal. Wall thickness was  normal. Systolic function was mildly reduced. - Atrial septum: No defect or patent foramen ovale was identified  by color flow Doppler. - Tricuspid valve: There was mild regurgitation. - Pulmonary arteries: Systolic pressure was within the normal  range. PA peak pressure: 32 mm Hg (S). - Inferior vena cava: The vessel was normal in size. The  respirophasic diameter changes were in the normal range (>= 50%),  consistent with normal central venous pressure.  ASSESSMENT AND PLAN:  1. Acute on Chronic Combined Systolic and Diastolic CHF Exacerbation with CAPEcho with normal LV function. Treatment for possible PNA 2 weeks ago. Repeat CXR 11/24/15 showed diffuse increased opacities bilaterally likely related to pulmonary edema. He has diuresed well with IV Lasix ( 1.5 Liters last 24 hours). He is on abx per primary team. His volume status is now normal. I would give him Lasix 20 mg po to use as needed for weight gain or swelling. I would stop IV Lasix today. Continue beta blocker and ARB. Would remove supplemental O2 this am.   2. Elevated Troponin/ History of CAD: He has no chest pain. The troponin value that was elevated is likely an  error as all other values were normal.   3. PAF: Maintaining NSR this admission. This patients CHA2DS2-VASc Score and unadjusted Ischemic Stroke Rate (% per year) is equal to 11.2 % stroke rate/year from a score of 7 (CHF, HTN, Vascular, Age (2), Stroke (2)). Continue Xarelto for anticoagulation. Continue beta blocker for rate control.   Olson Lucarelli  3/30/20177:11 AM

## 2015-11-26 ENCOUNTER — Inpatient Hospital Stay (HOSPITAL_COMMUNITY): Payer: Medicare Other

## 2015-11-26 DIAGNOSIS — R0600 Dyspnea, unspecified: Secondary | ICD-10-CM

## 2015-11-26 DIAGNOSIS — J9601 Acute respiratory failure with hypoxia: Secondary | ICD-10-CM | POA: Diagnosis present

## 2015-11-26 DIAGNOSIS — N183 Chronic kidney disease, stage 3 unspecified: Secondary | ICD-10-CM | POA: Diagnosis present

## 2015-11-26 DIAGNOSIS — I5033 Acute on chronic diastolic (congestive) heart failure: Secondary | ICD-10-CM

## 2015-11-26 DIAGNOSIS — N184 Chronic kidney disease, stage 4 (severe): Secondary | ICD-10-CM | POA: Diagnosis present

## 2015-11-26 LAB — BASIC METABOLIC PANEL
ANION GAP: 9 (ref 5–15)
BUN: 19 mg/dL (ref 6–20)
CALCIUM: 10.4 mg/dL — AB (ref 8.9–10.3)
CO2: 29 mmol/L (ref 22–32)
CREATININE: 1.42 mg/dL — AB (ref 0.61–1.24)
Chloride: 96 mmol/L — ABNORMAL LOW (ref 101–111)
GFR, EST AFRICAN AMERICAN: 52 mL/min — AB (ref >60)
GFR, EST NON AFRICAN AMERICAN: 44 mL/min — AB (ref >60)
Glucose, Bld: 105 mg/dL — ABNORMAL HIGH (ref 65–99)
Potassium: 3.6 mmol/L (ref 3.5–5.1)
SODIUM: 134 mmol/L — AB (ref 135–145)

## 2015-11-26 LAB — CALCIUM, IONIZED: Calcium, Ionized, Serum: 6.2 mg/dL — ABNORMAL HIGH (ref 4.5–5.6)

## 2015-11-26 LAB — SEDIMENTATION RATE: SED RATE: 94 mm/h — AB (ref 0–16)

## 2015-11-26 MED ORDER — GUAIFENESIN ER 600 MG PO TB12: 600.0000 mg | ORAL_TABLET | Freq: Two times a day (BID) | ORAL | Status: DC

## 2015-11-26 MED ORDER — METHYLPREDNISOLONE SODIUM SUCC 125 MG IJ SOLR
60.0000 mg | INTRAMUSCULAR | Status: DC
  Administered 2015-11-26 – 2015-11-27 (×2): 60 mg via INTRAVENOUS
  Filled 2015-11-26 (×2): qty 2

## 2015-11-26 MED ORDER — DOXYCYCLINE HYCLATE 100 MG PO TABS: 100.0000 mg | ORAL_TABLET | Freq: Two times a day (BID) | ORAL | Status: DC

## 2015-11-26 MED ORDER — GUAIFENESIN-DM 100-10 MG/5ML PO SYRP: 5.0000 mL | ORAL_SOLUTION | ORAL | Status: DC | PRN

## 2015-11-26 MED ORDER — FUROSEMIDE 40 MG PO TABS: 40.0000 mg | ORAL_TABLET | Freq: Every day | ORAL | Status: DC

## 2015-11-26 MED ORDER — CARVEDILOL 3.125 MG PO TABS: 3.1250 mg | ORAL_TABLET | Freq: Two times a day (BID) | ORAL | Status: DC

## 2015-11-26 NOTE — Progress Notes (Addendum)
[facility] PROGRESS NOTE  Jose [name] DGU:[id number] DOB: 05-13-1931 DOA: 11/22/2015 PCP: Marylene Land, MD   Brief narrative 82 year old male with history of chronic combined systolic and diastolic CHF, CAD status post CABG, paroxysmal A. fib on Xarelto CKD stage IV, hypertension presented with progressive dyspnea for 2 weeks duration. Patient admitted for acute on chronic CHF.  Assessment/Plan: Acute on chronic diastolic CHF Diuresed with IV lasix (negative  6 L since admission). Still requiring 3 L oxygen at rest and on ambulation with O2 sat in low 90s.  2-D echo shows improved EF of 55- 95% with diastolic dysfunction. Continue aspirin, losartan and Coreg. Continue statin. Discussed with Dr. Haroldine Laws recommends that although patient has diuresed very well he still appears quite short of breath with chest x-ray showing pulmonary edema versus atypical infection. Labs ordered for rheumatoid factor, ANCA, GBM, ANA. ESR elevated. Ordered CT chest without contrast to rule out interstitial lung disease/pulmonary fibrosis versus atypical infection. i will place him on IV solumedrol.  CT chest shows  Diffuse bilateral airspace ds with pulmonary edema vs atypical infection. Pulmonary consulted( Spoke with Dr Oletta Darter)  Assessment A. fib Rate controlled. Continue Xarelto.  Paroxysmal A. fib Rate controlled. Continue beta blocker and Xarelto.   Elevated troponin with history of CAD s/p CABG. Troponin peaked at 1.34. No chest pain symptoms. EKG without ST T changes. Echo with normal and no wall motion abnormality. Cardiology recommend medical management.  Continue aspirin, beta blocker and statin.  Chronic kidney disease stage III  at baseline. Continue to monitor.  Possible pneumonia Treated with empiric Unasyn and azithromycin. Discharged on oral doxycycline.  Moderate hypercalcemia Low PTH. Follow PTH related peptide and ionized calcium. Vitamin D supplement  held.    DVT prophylaxis: On Xarelto   Diet: Heart healthy    Code Status: Full code Family Communication: Wife At bedside Disposition Plan: Further workup with concern for atypical pneumonia versus interstitial lung disease.   Consultants:  Cardiology   Procedures: 2-D echo  Antibiotics:  IV Unasyn and azithromycin  HPI/Subjective: Seen and examined. Planned for discharge today but he still gets quite when did with hypoxia on room air. O2 sat stable in the 90s on 3 L.  Objective: Filed Vitals:   11/26/15 0959 11/26/15 1243  BP: 105/49 122/59  Pulse: 52 56  Temp: 98 F (36.7 C)   Resp: 18 18    Intake/Output Summary (Last 24 hours) at 11/26/15 1503 Last data filed at 11/26/15 1426  Gross per 24 hour  Intake      0 ml  Output   2025 ml  Net  -2025 ml   Filed Weights   11/24/15 0432 11/25/15 0515 11/26/15 0552  Weight: 75.206 kg (165 lb 12.8 oz) 75.796 kg (167 lb 1.6 oz) 75.615 kg (166 lb 11.2 oz)    Exam:   General:  Elderly male appears fatigued, not in distress  HEENT: No pallor, moist mucosa, no JVD, supple neck  Cardiovascular: S1 and S2 irregularly irregular, no murmurs  Respiratory: Clear bilaterally, no added sounds  GI: Soft, nondistended, nontender  Musculoskeletal: Warm, no edema  CNS: Alert and oriented  Data Reviewed: Basic Metabolic Panel:  Recent Labs Lab 11/22/15 2034 11/23/15 0415 11/24/15 0320 11/25/15 0320 11/26/15 0300  NA 134* 134* 133* 134* 134*  K 4.5 4.3 3.6 3.9 3.6  CL 100* 100* 97* 96* 96*  CO2 _0 GLUCOSE 101* 111* 102* 112* 105*  BUN 19 19 22* 22*  19  CREATININE 1.34* 1.46* 1.44* 1.44* 1.42*  CALCIUM 11.2* 10.9* 10.2 10.8* 10.4*  MG 1.6*  --   --   --   --    Liver Function Tests: No results for input(s): AST, ALT, ALKPHOS, BILITOT, PROT, ALBUMIN in the last 168 hours. No results for input(s): LIPASE, AMYLASE in the last 168 hours. No results for input(s): AMMONIA in the last 168  hours. CBC:  Recent Labs Lab 11/22/15 2034 11/23/15 0415 11/24/15 0650 11/25/15 0320  WBC 16.6* 14.0* 13.5* 13.8*  NEUTROABS 14.3*  --   --   --   HGB 13.5 12.7* 12.3* 13.0  HCT 39.1 37.7* 37.1* 38.6*  MCV 88.1 88.7 88.8 88.5  PLT 178 173 192 196   Cardiac Enzymes:  Recent Labs Lab 11/22/15 2034 11/23/15 0415 11/23/15 0939 11/23/15 1423  TROPONINI 0.04* 1.34* 0.08* 0.06*   BNP (last 3 results)  Recent Labs  11/22/15 2034  BNP 339.2*    ProBNP (last 3 results) No results for input(s): PROBNP in the last 8760 hours.  CBG: No results for input(s): GLUCAP in the last 168 hours.  Recent Results (from the past 240 hour(s))  Urine culture     Status: None   Collection Time: 11/24/15  8:25 AM  Result Value Ref Range Status   Specimen Description URINE, CLEAN CATCH  Final   Special Requests NONE  Final   Culture MULTIPLE SPECIES PRESENT, SUGGEST RECOLLECTION  Final   Report Status 11/25/2015 FINAL  Final  Culture, expectorated sputum-assessment     Status: None   Collection Time: 11/24/15 11:07 AM  Result Value Ref Range Status   Specimen Description SPUTUM  Final   Special Requests NONE  Final   Sputum evaluation   Final    MICROSCOPIC FINDINGS SUGGEST THAT THIS SPECIMEN IS NOT REPRESENTATIVE OF LOWER RESPIRATORY SECRETIONS. PLEASE RECOLLECT. Gram Stain Report Called to,Read Back By and Verified With: T Casilda Carls AT 1212 11/24/15 BY L BENFIELD    Report Status 11/24/2015 FINAL  Final  Culture, expectorated sputum-assessment     Status: None   Collection Time: 11/24/15  9:19 PM  Result Value Ref Range Status   Specimen Description SPUTUM  Final   Special Requests NONE  Final   Sputum evaluation   Final    THIS SPECIMEN IS ACCEPTABLE. RESPIRATORY CULTURE REPORT TO FOLLOW.   Report Status 11/24/2015 FINAL  Final  Culture, respiratory (NON-Expectorated)     Status: None (Preliminary result)   Collection Time: 11/24/15  9:19 PM  Result Value Ref Range Status    Specimen Description SPUTUM  Final   Special Requests NONE  Final   Gram Stain   Final    ABUNDANT WBC PRESENT,BOTH PMN AND MONONUCLEAR FEW SQUAMOUS EPITHELIAL CELLS PRESENT MODERATE GRAM POSITIVE COCCI IN PAIRS MODERATE GRAM NEGATIVE RODS Performed at [facility]    Culture   Final    Culture reincubated for better growth Performed at [facility]    Report Status PENDING  Incomplete     Studies: No results found.  Scheduled Meds: . ampicillin-sulbactam (UNASYN) IV  3 g Intravenous Q6H  . aspirin  81 mg Oral Daily  . azithromycin  500 mg Intravenous Q24H  . carvedilol  3.125 mg Oral BID WC  . furosemide  40 mg Intravenous Daily  . guaiFENesin  600 mg Oral BID  . losartan  50 mg Oral Daily  . pantoprazole  40 mg Oral Daily  . polyvinyl alcohol  1  drop Both Eyes BID  . pravastatin  80 mg Oral QHS  . Rivaroxaban  15 mg Oral Q supper   Continuous Infusions:     Time spent: 25 minutes    Abbie Berling  [facility] Pager [phone]. If 7PM-7AM, please contact night-coverage at [email or web address], password [facility] 11/26/2015, 3:03 PM  LOS: 4 days

## 2015-11-26 NOTE — Consult Note (Addendum)
Advanced Heart Failure Team Consult Note  Referring Physician: Dr Clementeen Graham Primary Physician: Dr Sandi Mariscal Primary Cardiologist:  Dr Verl Blalock  Reason for Consultation: ReDS Vest number 49%, Diastolic CHF  HPI:    Jose [name] is a 82 y.o. male with past medical history of CAD (s/p CABG in 2007 w/ LIMA-LAD, SVG-D1, SVG-1st & 2nd Mrg, SVG-PDA and posterior lateral with PFO closure at that time), chronic diastolic CHF (EF 74 - 44% by echo in 2014), CVA (06/2013), HTN, HLD, and PAF (on Xarelto)  Has been more SOB over the past 2-3 weeks.  Usually golfs 2-3 times a week and has been unable to as of late.  Very weak. SOB with mild/moderate exertion. Weight has been up 5-7 lbs.   He presented to [facility] on 11/22/2015 as a direct admit from his PCP's office for worsening shortness of breath for the past two weeks. Pertinent labs on admission include BNP 339. CXR with diffuse infiltrates.   Currently feeling SOB with productive cough with yellow sputum. No fevers. + chills. No change in weight.   Review of Systems: [y] = yes, [ ]  = no   General: Weight gain [y]; Weight loss [ ] ; Anorexia [ ] ; Fatigue [y]; Fever [ ] ; Chills [ ] ; Weakness [ ]   Cardiac: Chest pain/pressure [ ] ; Resting SOB [y]; Exertional SOB [y]; Orthopnea [ ] ; Pedal Edema [ ] ; Palpitations [ ] ; Syncope [ ] ; Presyncope [ ] ; Paroxysmal nocturnal dyspnea[ ]   Pulmonary: Cough [y]; Wheezing[ ] ; Hemoptysis[ ] ; Sputum [y]; Snoring [ ]   GI: Vomiting[ ] ; Dysphagia[ ] ; Melena[ ] ; Hematochezia [ ] ; Heartburn[ ] ; Abdominal pain [ ] ; Constipation [ ] ; Diarrhea [ ] ; BRBPR [ ]   GU: Hematuria[ ] ; Dysuria [ ] ; Nocturia[ ]   Vascular: Pain in legs with walking [ ] ; Pain in feet with lying flat [ ] ; Non-healing sores [ ] ; Stroke [ ] ; TIA [ ] ; Slurred speech [ ] ;  Neuro: Headaches[ ] ; Vertigo[ ] ; Seizures[ ] ; Paresthesias[ ] ;Blurred vision [ ] ; Diplopia [ ] ; Vision changes [ ]   Ortho/Skin: Arthritis [y]; Joint pain [y]; Muscle pain [ ] ; Joint  swelling [ ] ; Back Pain [ ] ; Rash [ ]   Psych: Depression[ ] ; Anxiety[ ]   Heme: Bleeding problems [ ] ; Clotting disorders [ ] ; Anemia [ ]   Endocrine: Diabetes [ ] ; Thyroid dysfunction[ ]   Home Medications Prior to Admission medications   Medication Sig Start Date End Date Taking? Authorizing Provider  Albuterol Sulfate (PROAIR RESPICLICK) 967 (90 Base) MCG/ACT AEPB Inhale 2 puffs into the lungs every 4 (four) hours as needed (shortness of breath/ wheezing).   Yes Historical Provider, MD  amLODipine (NORVASC) 5 MG tablet Take 5 mg by mouth daily.    Yes Historical Provider, MD  cholecalciferol (VITAMIN D) 1000 UNITS tablet Take 1,000 Units by mouth daily.   Yes Historical Provider, MD  COD LIVER OIL PO Take 1 capsule by mouth at bedtime.    Yes Historical Provider, MD  desonide (DESOWEN) 0.05 % cream Apply 1 application topically every other day. For rosacea 08/12/14  Yes Historical Provider, MD  losartan (COZAAR) 100 MG tablet Take 100 mg by mouth daily.    Yes Historical Provider, MD  Multiple Vitamin (MULTIVITAMIN WITH MINERALS) TABS tablet Take 1 tablet by mouth daily.   Yes Historical Provider, MD  omeprazole (PRILOSEC OTC) 20 MG tablet Take 20 mg by mouth daily.   Yes Historical Provider, MD  Polyethyl Glycol-Propyl Glycol (SYSTANE ULTRA OP) Place 1 drop into  both eyes 2 (two) times daily.   Yes Historical Provider, MD  polyethylene glycol (MIRALAX / GLYCOLAX) packet Take 17 g by mouth daily. Mix in 8 oz liquid and drink   Yes Historical Provider, MD  pravastatin (PRAVACHOL) 80 MG tablet Take 80 mg by mouth at bedtime.    Yes Historical Provider, MD  saw palmetto 160 MG capsule Take 160 mg by mouth 2 (two) times daily.     Yes Historical Provider, MD  tadalafil (CIALIS) 5 MG tablet Take 5 mg by mouth at bedtime.    Yes Historical Provider, MD  TESTOSTERONE TD Place 1 mL onto the skin daily. Apply every morning to back shoulder - 10% lipoderm cream compounded at [place]   Yes  Historical Provider, MD  XARELTO 15 MG TABS tablet TAKE 1 TABLET BY MOUTH DAILY WITH SUPPER 07/09/15  Yes Lelon Perla, MD  carvedilol (COREG) 3.125 MG tablet Take 1 tablet (3.125 mg total) by mouth 2 (two) times daily with a meal. 11/26/15   Nishant Dhungel, MD  doxycycline (VIBRA-TABS) 100 MG tablet Take 1 tablet (100 mg total) by mouth 2 (two) times daily. 11/26/15 11/28/15  Nishant Dhungel, MD  furosemide (LASIX) 40 MG tablet Take 1 tablet (40 mg total) by mouth daily. 11/26/15   Nishant Dhungel, MD  guaiFENesin (MUCINEX) 600 MG 12 hr tablet Take 1 tablet (600 mg total) by mouth 2 (two) times daily. 11/26/15   Nishant Dhungel, MD  guaiFENesin-dextromethorphan (ROBITUSSIN DM) 100-10 MG/5ML syrup Take 5 mLs by mouth every 4 (four) hours as needed for cough. 11/26/15   Louellen Molder, MD    Past Medical History: Past Medical History  Diagnosis Date  . Hypertension   . Other malaise and fatigue   . Paroxysmal atrial fibrillation (HCC)   . History of embolic stroke no residual    11/ 2014  --  right PCA and MCA branch infarts (left side vision difficulties)  . Bilateral carotid artery stenosis     mild bilateral proximcal ICA  40% per duplex 11/ 2014  . S/P CABG x 6     2007  . GERD (gastroesophageal reflux disease)   . Ureteral obstruction, right   . At risk for sleep apnea     STOP-BANG= 5   SENT TO PCP 05-20-2014  . ED (erectile dysfunction)   . Coronary artery disease     a. s/p CABG in 2007 w/ LIMA-LAD, SVG-D1, SVG-1st & 2nd Mrg, SVG-PDA and posterior lateral  . Eye infection 12/2014    Past Surgical History: Past Surgical History  Procedure Laterality Date  . Tee without cardioversion N/A 07/17/2013    Procedure: TRANSESOPHAGEAL ECHOCARDIOGRAM (TEE);  Surgeon: Josue Hector, MD;  Location: [facility] ENDOSCOPY;  Service: Cardiovascular;  Laterality: N/A;  normal LV size, mild prolapse of posterior mitral valve leaflet,  mild MR and TR, normal AV,  no LAA thrombus,  atrial septal  aneurysm with no PFO/ ASD and negative bubble study, normal RV, normal aorta with no debris  . Laparoscopic cholecystectomy  10-02-2000  . Coronary artery bypass graft  11-01-2005  dr Roxy Manns    CLOSURE OF PFO/  LIMA to LAD,  SVG to D1,  SVG to 1st & 2nd  MARGINAL branches, SVG  to PDA and posterior lateral  . Inguinal hernia repair Bilateral   . Cardiac catheterization  10-31-2005  dr wall    severe three vessel/  perserved LV  . Cardiovascular stress test  last one 05-19-2014  dr  crenshaw    low risk lexiscan no exercise study/ small inferobasal infarct with no ischemia /  ef 53%  . Appendectomy  1950's  . Right ureter obstruction surgery  age 64  . Cystoscopy w/ ureteral stent placement Right 05/21/2014    Procedure: CYSTOSCOPY WITH RETROGRADE PYELOGRAM/URETERAL STENT PLACEMENT;  Surgeon: Ailene Rud, MD;  Location: [facility];  Service: Urology;  Laterality: Right;  . Cystoscopy with ureteroscopy Right 05/21/2014    Procedure: CYSTOSCOPY WITH URETEROSCOPY;  Surgeon: Ailene Rud, MD;  Location: [facility];  Service: Urology;  Laterality: Right;    Family History: Family History  Problem Relation Age of Onset  . Heart disease Mother     PPM  . Hypertension Mother   . Lymphoma Father   . Prostate cancer Father   . Prostate cancer Brother   . Aneurysm Brother     REPAIR  . CAD Brother     CABG  . Other Brother     STENTS IN LEGS  . Other Sister     STOMACH ISSUES  . Depression Sister   . Lung cancer Brother     Asbestosis    Social History: Social History   Social History  . Marital Status: Married    Spouse Name: maxine  . Number of Children: 4  . Years of Education: college4   Occupational History  . retired    Social History Main Topics  . Smoking status: Former Smoker -- 1.00 packs/day for 35 years    Types: Cigarettes    Quit date: 08/29/1977  . Smokeless tobacco: Never Used  . Alcohol Use: No  . Drug Use: No    . Sexual Activity: Not Asked   Other Topics Concern  . None   Social History Narrative   Patient is married with 4 children.   Patient is right handed.   Patient has college education.   Patient drinks decaff coffee.    Allergies:  Allergies  Allergen Reactions  . Cephalexin Anaphylaxis    Pt says he has tolerated PCN in the past  . Iodinated Diagnostic Agents Anaphylaxis  . Iohexol Anaphylaxis     THROAT SWELLS AND STOPS BREATHING   . Ace Inhibitors Cough  . Metoprolol Other (See Comments)    Erectile dysfunction  . Viagra [Sildenafil Citrate] Other (See Comments)    Headache and vision changes  . Ciprofloxacin Hives, Itching and Rash  . Codeine Nausea And Vomiting and Other (See Comments)    headaches  . Sulfa Antibiotics Rash    Objective:    Vital Signs:   Temp:  [98 F (36.7 C)-99 F (37.2 C)] 98 F (36.7 C) (03/31 0959) Pulse Rate:  [52-67] 52 (03/31 0959) Resp:  [16-18] 18 (03/31 0959) BP: (105-116)/(45-64) 105/49 mmHg (03/31 0959) SpO2:  [93 %-100 %] 100 % (03/31 0959) Weight:  [166 lb 11.2 oz (75.615 kg)] 166 lb 11.2 oz (75.615 kg) (03/31 0552) Last BM Date: 11/26/15  Weight change: Filed Weights   11/24/15 0432 11/25/15 0515 11/26/15 0552  Weight: 165 lb 12.8 oz (75.206 kg) 167 lb 1.6 oz (75.796 kg) 166 lb 11.2 oz (75.615 kg)    Intake/Output:   Intake/Output Summary (Last 24 hours) at 11/26/15 1230 Last data filed at 11/26/15 0558  Gross per 24 hour  Intake      0 ml  Output   1475 ml  Net  -1475 ml     Physical Exam: General:  Elderly appearing  HEENT: normal Neck: supple. JVP 6-7. Carotids 2+ bilat; no bruits. No lymphadenopathy or thyromegaly appreciated. Cor: PMI nondisplaced. Regular rate & rhythm. No rubs, gallops or murmurs. Lungs: Mild basilar crackles, R>L Abdomen: soft, NT, ND, no HSM. No bruits or masses. +BS  Extremities: no cyanosis, clubbing, rash, edema Neuro: alert & orientedx3, cranial nerves grossly intact. moves all  4 extremities w/o difficulty. Affect pleasant  Telemetry: NSR 50-60s  Labs: Basic Metabolic Panel:  Recent Labs Lab 11/22/15 2034 11/23/15 0415 11/24/15 0320 11/25/15 0320 11/26/15 0300  NA 134* 134* 133* 134* 134*  K 4.5 4.3 3.6 3.9 3.6  CL 100* 100* 97* 96* 96*  CO2 26 27 27 29 29   GLUCOSE 101* 111* 102* 112* 105*  BUN 19 19 22* 22* 19  CREATININE 1.34* 1.46* 1.44* 1.44* 1.42*  CALCIUM 11.2* 10.9* 10.2 10.8* 10.4*  MG 1.6*  --   --   --   --     Liver Function Tests: No results for input(s): AST, ALT, ALKPHOS, BILITOT, PROT, ALBUMIN in the last 168 hours. No results for input(s): LIPASE, AMYLASE in the last 168 hours. No results for input(s): AMMONIA in the last 168 hours.  CBC:  Recent Labs Lab 11/22/15 2034 11/23/15 0415 11/24/15 0650 11/25/15 0320  WBC 16.6* 14.0* 13.5* 13.8*  NEUTROABS 14.3*  --   --   --   HGB 13.5 12.7* 12.3* 13.0  HCT 39.1 37.7* 37.1* 38.6*  MCV 88.1 88.7 88.8 88.5  PLT 178 173 192 196    Cardiac Enzymes:  Recent Labs Lab 11/22/15 2034 11/23/15 0415 11/23/15 0939 11/23/15 1423  TROPONINI 0.04* 1.34* 0.08* 0.06*    BNP: BNP (last 3 results)  Recent Labs  11/22/15 2034  BNP 339.2*    ProBNP (last 3 results) No results for input(s): PROBNP in the last 8760 hours.   CBG: No results for input(s): GLUCAP in the last 168 hours.  Coagulation Studies: No results for input(s): LABPROT, INR in the last 72 hours.  Other results: EKG: 11/22/15 NSR with PVCs  Imaging:  No results found.   Medications:     Current Medications: . ampicillin-sulbactam (UNASYN) IV  3 g Intravenous Q6H  . aspirin  81 mg Oral Daily  . azithromycin  500 mg Intravenous Q24H  . carvedilol  3.125 mg Oral BID WC  . furosemide  40 mg Intravenous Daily  . guaiFENesin  600 mg Oral BID  . losartan  50 mg Oral Daily  . pantoprazole  40 mg Oral Daily  . polyvinyl alcohol  1 drop Both Eyes BID  . pravastatin  80 mg Oral QHS  . Rivaroxaban  15  mg Oral Q supper     Infusions:      Assessment   1. Chronic diastolic HF 2. Severe dyspnea with abnormal  CXR 3. CAD 4. PAF  Plan    HF team consulted with ReDS Vest reading 58%.    He is not volume overloaded on exam.  CXR with concern for pulmonary fibrosis.  Will order CT w/o contrast, ANA, and sed rate. Needs a pulmonary consult.   Length of Stay: 4  Shirley Friar PA-C 11/26/2015, 12:30 PM  Advanced Heart Failure Team Pager [phone] (M-F; 7a - 4p)  Please contact [facility] for night-coverage after hours (4p -7a ) and weekends on [email or web address]  Patient seen and examined with Oda Kilts, PA-C. We discussed all aspects of the encounter. I agree with the assessment and plan as stated above.  Echo and CXR images reviewed personally. EF is normal Grade I DD. IVC small. No fluid on exam. CXR markedly abnormal and he is very SOB. ReDS reading very high suggestive of lung fluid/inflammation. Suspect he has atypical PNA versus acute pneumonitis. Will get non-contrast chest CT, ESR, ANA, ANCA, RF, anti-GBM. Will likely need Pulmonary Consult and steroids. Would not diurese farther at this point. Discussed with Dr. Clementeen Graham.   Jaeshaun Riva,MD 12:42 PM

## 2015-11-26 NOTE — Progress Notes (Signed)
ReDS Vest Discharge Study  Results of ReDS reading  Your patient is in the Unblinded arm of the Vest at Discharge study.  The ReDS reading is:  ( >39) = 58.  Your patient will not be discharged at this time and will have a AHF team consult.  Thank You   The research team

## 2015-11-26 NOTE — Care Management Note (Signed)
Case Management Note Donn PieriniKristi Shenequa Howse RN, BSN Unit 2W-Case Manager [phone]  Patient Details  Name: Jose BergamoHenry V [name] MRN: [id number] Date of Birth: [birth year]  Subjective/Objective:     Pt admitted with acute on chronic heart failure               Action/Plan: PTA pt lived at home with wife- independent- anticipate return home- CM to follow for potential d/c needs  Expected Discharge Date:   11/26/15               Expected Discharge Plan:  Home w Home Health Services  In-House Referral:     Discharge planning Services  CM Consult  Post Acute Care Choice:  Home Health, Durable Medical Equipment Choice offered to:  Patient, Spouse  DME Arranged:  Oxygen DME Agency:  [facility]  HH Arranged:  RN, Disease Management, PT HH Agency:  [facility]  Status of Service:  Completed, signed off  Medicare Important Message Given:  Yes Date Medicare IM Given:    Medicare IM give by:    Date Additional Medicare IM Given:    Additional Medicare Important Message give by:     If discussed at Long Length of Stay Meetings, dates discussed:   Discharge Disposition: home/home health    Additional Comments:  11/26/15- 1015- Donn PieriniKristi Kerra Guilfoil RN, BSN- pt for d/c home today with [facility] for HF f/u and PT- pt will also need home 02 arranged- orders have been placed- in to speak with pt and wife at bedside- [place] [facility] [place] [place] would like to check [place]- [facility]- spoke with Berna SpareMarcus- who states that pt can [facility]- in the [place] are not contracted with any one agency- f/u done with pt and [facility]- [facility] with HF management-   Zenda AlpersWebster, [name], RN 11/26/2015, 10:27 AM

## 2015-11-27 DIAGNOSIS — J8489 Other specified interstitial pulmonary diseases: Secondary | ICD-10-CM | POA: Diagnosis present

## 2015-11-27 DIAGNOSIS — J849 Interstitial pulmonary disease, unspecified: Secondary | ICD-10-CM | POA: Diagnosis present

## 2015-11-27 DIAGNOSIS — J9601 Acute respiratory failure with hypoxia: Secondary | ICD-10-CM

## 2015-11-27 DIAGNOSIS — R7989 Other specified abnormal findings of blood chemistry: Secondary | ICD-10-CM

## 2015-11-27 DIAGNOSIS — R0602 Shortness of breath: Secondary | ICD-10-CM

## 2015-11-27 DIAGNOSIS — N1330 Unspecified hydronephrosis: Secondary | ICD-10-CM

## 2015-11-27 DIAGNOSIS — I10 Essential (primary) hypertension: Secondary | ICD-10-CM

## 2015-11-27 DIAGNOSIS — N183 Chronic kidney disease, stage 3 (moderate): Secondary | ICD-10-CM

## 2015-11-27 DIAGNOSIS — I2510 Atherosclerotic heart disease of native coronary artery without angina pectoris: Secondary | ICD-10-CM

## 2015-11-27 DIAGNOSIS — R778 Other specified abnormalities of plasma proteins: Secondary | ICD-10-CM | POA: Diagnosis present

## 2015-11-27 LAB — CALCIUM, IONIZED: Calcium, Ionized, Serum: 6 mg/dL — ABNORMAL HIGH (ref 4.5–5.6)

## 2015-11-27 LAB — PTH-RELATED PEPTIDE

## 2015-11-27 LAB — RHEUMATOID FACTOR: Rhuematoid fact SerPl-aCnc: 28 IU/mL — ABNORMAL HIGH (ref 0.0–13.9)

## 2015-11-27 NOTE — Progress Notes (Addendum)
Advanced Heart Failure Team Rounding Note    Subjective    Jose [name] is a 82 y.o. male with past medical history of CAD (s/p CABG in 2007 w/ LIMA-LAD, SVG-D1, SVG-1st & 2nd Mrg, SVG-PDA and posterior lateral with PFO closure at that time), chronic diastolic CHF (EF 45 - 17% by echo in 2014), CVA (06/2013), HTN, HLD, and PAF (on Xarelto)  He presented to [facility] on 11/22/2015 as a direct admit from his PCP's office for worsening shortness of breath for the past two weeks. Pertinent labs on admission include BNP 339. CXR with diffuse infiltrates. ReDS Vest 58%  Chest CT 3/31 with diffuse infiltrates. ESR 94 RF markedly elevated. Started on solumedrol on 11/26/15. Feels much better. Breathing improved. Cough resolved for first time in months. Slept well.     Objective:    Vital Signs:   Temp:  [97.8 F (36.6 C)-98.4 F (36.9 C)] 97.8 F (36.6 C) (04/01 0437) Pulse Rate:  [52-69] 62 (04/01 0437) Resp:  [18-20] 20 (04/01 0437) BP: (105-145)/(49-63) 126/63 mmHg (04/01 0437) SpO2:  [91 %-100 %] 99 % (04/01 0437) Weight:  [76 kg (167 lb 8.8 oz)] 76 kg (167 lb 8.8 oz) (04/01 0437) Last BM Date: 11/26/15  Weight change: Filed Weights   11/25/15 0515 11/26/15 0552 11/27/15 0437  Weight: 75.796 kg (167 lb 1.6 oz) 75.615 kg (166 lb 11.2 oz) 76 kg (167 lb 8.8 oz)    Intake/Output:   Intake/Output Summary (Last 24 hours) at 11/27/15 0754 Last data filed at 11/27/15 0438  Gross per 24 hour  Intake      0 ml  Output   2470 ml  Net  -2470 ml     Physical Exam: General:  Elderly appearing HEENT: normal Neck: supple. JVP 6-7. Carotids 2+ bilat; no bruits. No lymphadenopathy or thyromegaly appreciated. Cor: PMI nondisplaced. Regular rate & rhythm. No rubs, gallops or murmurs. Lungs: Mild basilar crackles, R>L Abdomen: soft, NT, ND, no HSM. No bruits or masses. +BS  Extremities: no cyanosis, clubbing, rash, edema Neuro: alert & orientedx3, cranial nerves grossly intact.  moves all 4 extremities w/o difficulty. Affect pleasant  Telemetry: NSR 50-60s  Labs: Basic Metabolic Panel:  Recent Labs Lab 11/22/15 2034 11/23/15 0415 11/24/15 0320 11/25/15 0320 11/26/15 0300  NA 134* 134* 133* 134* 134*  K 4.5 4.3 3.6 3.9 3.6  CL 100* 100* 97* 96* 96*  CO2 26 27 27 29 29   GLUCOSE 101* 111* 102* 112* 105*  BUN 19 19 22* 22* 19  CREATININE 1.34* 1.46* 1.44* 1.44* 1.42*  CALCIUM 11.2* 10.9* 10.2 10.8* 10.4*  MG 1.6*  --   --   --   --     Liver Function Tests: No results for input(s): AST, ALT, ALKPHOS, BILITOT, PROT, ALBUMIN in the last 168 hours. No results for input(s): LIPASE, AMYLASE in the last 168 hours. No results for input(s): AMMONIA in the last 168 hours.  CBC:  Recent Labs Lab 11/22/15 2034 11/23/15 0415 11/24/15 0650 11/25/15 0320  WBC 16.6* 14.0* 13.5* 13.8*  NEUTROABS 14.3*  --   --   --   HGB 13.5 12.7* 12.3* 13.0  HCT 39.1 37.7* 37.1* 38.6*  MCV 88.1 88.7 88.8 88.5  PLT 178 173 192 196    Cardiac Enzymes:  Recent Labs Lab 11/22/15 2034 11/23/15 0415 11/23/15 0939 11/23/15 1423  TROPONINI 0.04* 1.34* 0.08* 0.06*    BNP: BNP (last 3 results)  Recent Labs  11/22/15 2034  BNP 339.2*    ProBNP (last 3 results) No results for input(s): PROBNP in the last 8760 hours.   CBG: No results for input(s): GLUCAP in the last 168 hours.  Coagulation Studies: No results for input(s): LABPROT, INR in the last 72 hours.  Other results: EKG: 11/22/15 NSR with PVCs  Imaging: Ct Chest Wo Contrast  11/26/2015  CLINICAL DATA:  Cough, shortness of breath, and weakness for 3 weeks. EXAM: CT CHEST WITHOUT CONTRAST TECHNIQUE: Multidetector CT imaging of the chest was performed following the standard protocol without IV contrast. COMPARISON:  Chest radiograph on 10/17/2015 and 11/08/2015 FINDINGS: Mediastinum/Lymph Nodes: Mild cardiomegaly is noted without evidence of pericardial effusion. A moderate size hiatal hernia is also  seen. Mild mediastinal and probable bilateral hilar lymphadenopathy is noted. Largest index lymph node in the precarinal region measures 1.5 cm in short axis. Lungs/Pleura: There is diffuse heterogeneous bilateral airspace disease seen throughout both lungs, involving right lung slightly worse than left. This is suspicious for diffuse pulmonary edema although infection cannot be excluded. No evidence of pleural effusion. Mild underlying emphysema noted. Upper abdomen: Severe right hydronephrosis is seen, without significant change compared to previous abdomen CT in 2015. Musculoskeletal: No chest wall mass or suspicious bone lesions identified. IMPRESSION: Diffuse bilateral heterogeneous airspace disease superimposed on mild emphysema. This is suspicious for diffuse pulmonary edema, although infection cannot definitely be excluded. Mild nonspecific mediastinal and probable bilateral hilar lymphadenopathy. This may be reactive in etiology or secondary to lymphedema. Mild cardiomegaly and moderate hiatal hernia. Chronic severe right hydronephrosis, without significant change compared to previous CT in 2015. Electronically Signed   By: Earle Gell M.D.   On: 11/26/2015 16:16     Medications:     Current Medications: . ampicillin-sulbactam (UNASYN) IV  3 g Intravenous Q6H  . aspirin  81 mg Oral Daily  . azithromycin  500 mg Intravenous Q24H  . carvedilol  3.125 mg Oral BID WC  . furosemide  40 mg Intravenous Daily  . guaiFENesin  600 mg Oral BID  . losartan  50 mg Oral Daily  . methylPREDNISolone (SOLU-MEDROL) injection  60 mg Intravenous Q24H  . pantoprazole  40 mg Oral Daily  . polyvinyl alcohol  1 drop Both Eyes BID  . pravastatin  80 mg Oral QHS  . Rivaroxaban  15 mg Oral Q supper    Infusions:     Assessment   1. Chronic diastolic HF 2. Severe dyspnea with abnormal  CXR - suspect acute pneumonitis 3. CAD 4. PAF  Plan     Echo and CXR images reviewed personally. EF is normal Grade  I DD. IVC small. No fluid on exam. ReDS reading very high suggestive of lung fluid/inflammation. Suspect he has atypical PNA versus acute pneumonitis.  Chest CT reviewed personally with diffuse infiltrates . Exam not c/w fluid overload. ESR and RF very elevated. Suspect acute pneumonitis. Now on steroids with marked improvement. Would suggest Pulmonary consult (Dr. Clementeen Graham spoke with Dr. Oletta Darter on 3/31). Continue Xarelto for PAF. CAD is stable. Would continue home diuretic regimen.   Bensimhon, Daniel,MD 7:54 AM

## 2015-11-27 NOTE — Progress Notes (Signed)
Pharmacy Antibiotic Note  Jose [name] is a 82 y.o. male admitted on 11/22/2015 with pneumonia.  Pharmacy has been consulted for Unasyn dosing - D#4. WBC 13.8 stable. Afeb. To doxy on dc per MD note. CrCl~40.  Plan:   Height: 5\' 9"  (175.3 cm) Weight: 167 lb 8.8 oz (76 kg) IBW/kg (Calculated) : 70.7  Temp (24hrs), Avg:98.1 F (36.7 C), Min:97.8 F (36.6 C), Max:98.4 F (36.9 C)   Recent Labs Lab 11/22/15 2034 11/23/15 0415 11/24/15 0320 11/24/15 0650 11/25/15 0320 11/26/15 0300  WBC 16.6* 14.0*  --  13.5* 13.8*  --   CREATININE 1.34* 1.46* 1.44*  --  1.44* 1.42*    Estimated Creatinine Clearance: 40.1 mL/min (by C-G formula based on Cr of 1.42).    Allergies  Allergen Reactions  . Cephalexin Anaphylaxis    Pt says he has tolerated PCN in the past  . Iodinated Diagnostic Agents Anaphylaxis  . Iohexol Anaphylaxis     THROAT SWELLS AND STOPS BREATHING   . Ace Inhibitors Cough  . Metoprolol Other (See Comments)    Erectile dysfunction  . Viagra [Sildenafil Citrate] Other (See Comments)    Headache and vision changes  . Ciprofloxacin Hives, Itching and Rash  . Codeine Nausea And Vomiting and Other (See Comments)    headaches  . Sulfa Antibiotics Rash    Antimicrobials this admission: Levaquin pta Azithro 3/29>> Unasyn 3/29>>  Dose adjustments this admission: n/a  Microbiology results: 3/29 sputum>> 3/29 UC>>mult species, recollect  Babs BertinHaley Yandel Zeiner, PharmD, BCPS Clinical Pharmacist Pager [phone] 11/27/2015 1:11 PM

## 2015-11-27 NOTE — Progress Notes (Signed)
Progress Note   Jose [name] EZM:[id number] DOB: [birth year] DOA: 11/22/2015 PCP: Marylene Land, MD   Brief Narrative:   Jose [name] is an 82 y.o. male with history of chronic combined systolic and diastolic CHF, CAD status post CABG, paroxysmal A. fib on Xarelto CKD stage IV, hypertension presented with progressive dyspnea for 2 weeks duration. Patient admitted for acute on chronic CHF.  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 Still requiring 3 L oxygen at rest and on ambulation with O2 sat in low 90s. 2-D echo shows improved EF of 55- 50% with diastolic dysfunction.  Continue aspirin, losartan and Coreg.  Dr. Haroldine Laws recommended pulmonary evaluation given lack of improvement despite adequate diuresis. Follow up ANCA, GBM, ANA. ESR/RF elevated. CT of the chest done 11/26/15 showed diffuse bilateral heterogeneous airspace disease superimposed on mild emphysema with nonspecific mediastinal and bilateral hilar lymphadenopathy. 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  Active problems  Atrial fibrillation Rate controlled. Continue Xarelto and beta blocker.  Elevated troponin with history of CAD s/p CABG. Troponin peaked at 1.34. No chest pain symptoms. EKG without ST T changes. Echo negative for wall motion abnormality. Cardiology recommends medical management. Continue aspirin, beta blocker and statin.  Chronic kidney disease stage III/chronic severe right hydronephrosis Baseline creatinine 1.3-1.4. Creatinine isat baseline. Continue to monitor. Hydronephrosis unchanged from prior CT done in 2015.  Possible pneumonia Was on  outpatient doxycycline prior to admission and then was treated with empiric Unasyn and azithromycin in-house. Sputum culture pending. We'll discontinue Unasyn per pulmonology recommendations. We'll complete 5 day course of azithromycin 11/28/15.  Moderate hypercalcemia Low PTH. Follow PTH related peptide and ionized calcium. Vitamin D supplement held.  DVT prophylaxis On Xarelto   Family Communication/Anticipated D/C date and plan/Code Status   Family Communication: Nephew updated at the bedside. Disposition Plan/date: Home when stable, pending PT evaluation and improvement of respiratory symptoms. Code Status: Full code.   Procedures and diagnostic studies:   Dg Chest 2 View  11/22/2015  CLINICAL DATA:  Followup pneumonia EXAM: CHEST  2 VIEW COMPARISON:  11/08/2015 FINDINGS: Cardiac shadow remains enlarged. Postsurgical changes are again seen. Increased bilateral opacities are noted when compared with the prior exam. This likely represents a component of superimposed edema over the chronic changes seen previously. No focal confluent infiltrate or effusion is noted. No bony abnormality is seen. IMPRESSION: Diffuse increased opacities bilaterally likely related to pulmonary edema. Electronically Signed   By: Inez Catalina M.D.   On: 11/22/2015 16:10   Dg Chest 2 View  11/08/2015  CLINICAL DATA:  Cough and shortness of breath for 2 weeks. Concern for pneumonia. EXAM: CHEST  2 VIEW COMPARISON:  01/12/2012 FINDINGS: Prior median sternotomy. Patient rotated minimally right on the frontal. Midline trachea. Mild cardiomegaly with transverse aortic atherosclerosis. No pleural effusion or pneumothorax. Patchy interstitial and airspace opacities throughout both lungs. IMPRESSION: Patchy interstitial and airspace opacities, suspicious for multifocal pneumonia. Interstitial pulmonary edema could look similar, but is felt unlikely given absence of pleural fluid. Electronically Signed   By: Abigail Miyamoto M.D.    On: 11/08/2015 15:56   Ct Chest Wo Contrast  11/26/2015  CLINICAL  DATA:  Cough, shortness of breath, and weakness for 3 weeks. EXAM: CT CHEST WITHOUT CONTRAST TECHNIQUE: Multidetector CT imaging of the chest was performed following the standard protocol without IV contrast. COMPARISON:  Chest radiograph on 10/17/2015 and 11/08/2015 FINDINGS: Mediastinum/Lymph Nodes: Mild cardiomegaly is noted without evidence of pericardial effusion. A moderate size hiatal hernia is also seen. Mild mediastinal and probable bilateral hilar lymphadenopathy is noted. Largest index lymph node in the precarinal region measures 1.5 cm in short axis. Lungs/Pleura: There is diffuse heterogeneous bilateral airspace disease seen throughout both lungs, involving right lung slightly worse than left. This is suspicious for diffuse pulmonary edema although infection cannot be excluded. No evidence of pleural effusion. Mild underlying emphysema noted. Upper abdomen: Severe right hydronephrosis is seen, without significant change compared to previous abdomen CT in 2015. Musculoskeletal: No chest wall mass or suspicious bone lesions identified. IMPRESSION: Diffuse bilateral heterogeneous airspace disease superimposed on mild emphysema. This is suspicious for diffuse pulmonary edema, although infection cannot definitely be excluded. Mild nonspecific mediastinal and probable bilateral hilar lymphadenopathy. This may be reactive in etiology or secondary to lymphedema. Mild cardiomegaly and moderate hiatal hernia. Chronic severe right hydronephrosis, without significant change compared to previous CT in 2015. Electronically Signed   By: Earle Gell M.D.   On: 11/26/2015 16:16   Dg Chest Port 1 View  11/24/2015  CLINICAL DATA:  Shortness of breath.  Cough. EXAM: PORTABLE CHEST 1 VIEW COMPARISON:  11/22/2015 FINDINGS: There are diffuse reticular densities throughout both lungs. No focal confluent opacities. Heart size is within normal limits and  stable. Again noted are median sternotomy wires and evidence of prior CABG procedure. Atherosclerotic calcifications at the aortic arch. Negative for a pneumothorax. IMPRESSION: Diffuse reticular lung densities. These findings likely represent pulmonary edema. Atypical infectious process would also be in the differential diagnosis. Electronically Signed   By: Markus Daft M.D.   On: 11/24/2015 07:16    Medical Consultants:    Pulmonology  Cardiology  Anti-Infectives:   Unasyn 11/24/15---> 11/27/15 Azithromycin 11/24/15---> 11/28/15  Subjective:   Jose [name] feels weak but says he is breathing a little better. Occasional cough. No complaints of pain. Appetite fair.  Objective:    Filed Vitals:   11/26/15 1959 11/27/15 0437 11/27/15 1416 11/27/15 1510  BP: 145/61 126/63  138/76  Pulse: 69 62  70  Temp: 98.4 F (36.9 C) 97.8 F (36.6 C) 97.6 F (36.4 C) 97.7 F (36.5 C)  TempSrc: Oral Oral Oral Oral  Resp: 18 20 18 18   Height:      Weight:  76 kg (167 lb 8.8 oz)    SpO2: 91% 99% 95% 96%    Intake/Output Summary (Last 24 hours) at 11/27/15 1621 Last data filed at 11/27/15 1300  Gross per 24 hour  Intake    240 ml  Output   2070 ml  Net  -1830 ml   Filed Weights   11/25/15 0515 11/26/15 0552 11/27/15 0437  Weight: 75.796 kg (167 lb 1.6 oz) 75.615 kg (166 lb 11.2 oz) 76 kg (167 lb 8.8 oz)    Exam: Gen:  Frail, weak Cardiovascular:  RRR, No M/R/G Respiratory:  Bibasilar crackles Gastrointestinal:  Abdomen soft, NT/ND, + BS Extremities:  No C/E/C   Data Reviewed:    Labs: Basic Metabolic Panel:  Recent Labs Lab 11/22/15 2034 11/23/15 0415 11/24/15 0320 11/25/15 0320 11/26/15 0300  NA 134* 134* 133* 134* 134*  K 4.5 4.3 3.6 3.9 3.6  CL 100* 100*  97* 96* 96*  CO2 26 27 27 29 29   GLUCOSE 101* 111* 102* 112* 105*  BUN 19 19 22* 22* 19  CREATININE 1.34* 1.46* 1.44* 1.44* 1.42*  CALCIUM 11.2* 10.9* 10.2 10.8* 10.4*  MG 1.6*  --   --   --   --     GFR Estimated Creatinine Clearance: 40.1 mL/min (by C-G formula based on Cr of 1.42). Liver Function Tests: No results for input(s): AST, ALT, ALKPHOS, BILITOT, PROT, ALBUMIN in the last 168 hours. No results for input(s): LIPASE, AMYLASE in the last 168 hours. No results for input(s): AMMONIA in the last 168 hours. Coagulation profile No results for input(s): INR, PROTIME in the last 168 hours.  CBC:  Recent Labs Lab 11/22/15 2034 11/23/15 0415 11/24/15 0650 11/25/15 0320  WBC 16.6* 14.0* 13.5* 13.8*  NEUTROABS 14.3*  --   --   --   HGB 13.5 12.7* 12.3* 13.0  HCT 39.1 37.7* 37.1* 38.6*  MCV 88.1 88.7 88.8 88.5  PLT 178 173 192 196   Cardiac Enzymes:  Recent Labs Lab 11/22/15 2034 11/23/15 0415 11/23/15 0939 11/23/15 1423  TROPONINI 0.04* 1.34* 0.08* 0.06*    Microbiology Recent Results (from the past 240 hour(s))  Urine culture     Status: None   Collection Time: 11/24/15  8:25 AM  Result Value Ref Range Status   Specimen Description URINE, CLEAN CATCH  Final   Special Requests NONE  Final   Culture MULTIPLE SPECIES PRESENT, SUGGEST RECOLLECTION  Final   Report Status 11/25/2015 FINAL  Final  Culture, expectorated sputum-assessment     Status: None   Collection Time: 11/24/15 11:07 AM  Result Value Ref Range Status   Specimen Description SPUTUM  Final   Special Requests NONE  Final   Sputum evaluation   Final    MICROSCOPIC FINDINGS SUGGEST THAT THIS SPECIMEN IS NOT REPRESENTATIVE OF LOWER RESPIRATORY SECRETIONS. PLEASE RECOLLECT. Gram Stain Report Called to,Read Back By and Verified With: T Casilda Carls AT 1212 11/24/15 BY L BENFIELD    Report Status 11/24/2015 FINAL  Final  Culture, expectorated sputum-assessment     Status: None   Collection Time: 11/24/15  9:19 PM  Result Value Ref Range Status   Specimen Description SPUTUM  Final   Special Requests NONE  Final   Sputum evaluation   Final    THIS SPECIMEN IS ACCEPTABLE. RESPIRATORY CULTURE REPORT TO  FOLLOW.   Report Status 11/24/2015 FINAL  Final  Culture, respiratory (NON-Expectorated)     Status: None (Preliminary result)   Collection Time: 11/24/15  9:19 PM  Result Value Ref Range Status   Specimen Description SPUTUM  Final   Special Requests NONE  Final   Gram Stain   Final    ABUNDANT WBC PRESENT,BOTH PMN AND MONONUCLEAR FEW SQUAMOUS EPITHELIAL CELLS PRESENT MODERATE GRAM POSITIVE COCCI IN PAIRS MODERATE GRAM NEGATIVE RODS Performed at [facility]    Culture   Final    Culture reincubated for better growth Performed at [facility]    Report Status PENDING  Incomplete     Medications:   . ampicillin-sulbactam (UNASYN) IV  3 g Intravenous Q6H  . aspirin  81 mg Oral Daily  . azithromycin  500 mg Intravenous Q24H  . carvedilol  3.125 mg Oral BID WC  . furosemide  40 mg Intravenous Daily  . guaiFENesin  600 mg Oral BID  . losartan  50 mg Oral Daily  . methylPREDNISolone (SOLU-MEDROL) injection  60 mg  Intravenous Q24H  . pantoprazole  40 mg Oral Daily  . polyvinyl alcohol  1 drop Both Eyes BID  . pravastatin  80 mg Oral QHS  . Rivaroxaban  15 mg Oral Q supper   Continuous Infusions:   Time spent: 25 minutes.   LOS: 5 days   [place] [facility] Pager [phone]. If unable to reach me by pager, please call my cell phone at [phone].  *Please refer to [email or web address], password [email or web address] to get updated schedule on who will round on this patient, as hospitalists switch teams weekly. If 7PM-7AM, please contact night-coverage at [email or web address], password [email or web address] for any overnight needs.  11/27/2015, 4:21 PM

## 2015-11-27 NOTE — Consult Note (Signed)
PULMONARY / CRITICAL CARE MEDICINE   Name: Jose [name] MRN: [id number] DOB: 1931-03-07    ADMISSION DATE:  11/22/2015 CONSULTATION DATE:  11/27/15  REFERRING MD:   Venia Carbon  CHIEF COMPLAINT:  Sob   HISTORY OF PRESENT ILLNESS:   82 yowm remote smoker acutely ill x 6 weeks pta with sob/cough variably yellow already rx with approp abx for [facility] admitted with diffuse pulmonary infiltrates and high ESR and started on solumerol > pccm service asked to see am 4/1  No h/o unusual exp/ ctd/ travel  No obvious day to day or daytime variability or assoc   cp or chest tightness, subjective wheeze or overt sinus or hb symptoms. No unusual exp hx or h/o childhood pna/ asthma or knowledge of premature birth.  Sleeping ok without nocturnal  or early am exacerbation  of respiratory  c/o's or need for noct saba. Also denies any obvious fluctuation of symptoms with weather or environmental changes or other aggravating or alleviating factors except as outlined above   Current Medications, Allergies, Complete Past Medical History, Past Surgical History, Family History, and Social History were reviewed in [employer] record.  ROS  The following are not active complaints unless bolded sore throat, dysphagia, dental problems, itching, sneezing,  nasal congestion or excess/ purulent secretions, ear ache,   fever, chills, sweats, unintended wt loss, classically pleuritic or exertional cp, hemoptysis,  orthopnea pnd or leg swelling, presyncope, palpitations, abdominal pain, anorexia, nausea, vomiting, diarrhea  or change in bowel or bladder habits, change in stools or urine, dysuria,hematuria,  rash, arthralgias, visual complaints, headache, numbness, weakness or ataxia or problems with walking or coordination,  change in mood/affect or memory.       PAST MEDICAL HISTORY :  He  has a past medical history of Hypertension; Other malaise and fatigue; Paroxysmal atrial fibrillation ([place]);  History of embolic stroke (no residual); Bilateral carotid artery stenosis; S/P CABG x 6; GERD (gastroesophageal reflux disease); Ureteral obstruction, right; At risk for sleep apnea; ED (erectile dysfunction); Coronary artery disease; and Eye infection (12/2014).  PAST SURGICAL HISTORY: He  has past surgical history that includes TEE without cardioversion (N/A, 07/17/2013); Laparoscopic cholecystectomy (10-02-2000); Coronary artery bypass graft (11-01-2005  dr Roxy Manns); Inguinal hernia repair (Bilateral); Cardiac catheterization (10-31-2005  dr wall); Cardiovascular stress test (last one 05-19-2014  dr Stanford Breed); Appendectomy (1950's); RIGHT URETER OBSTRUCTION SURGERY (age 8); Cystoscopy w/ ureteral stent placement (Right, 05/21/2014); and Cystoscopy with ureteroscopy (Right, 05/21/2014).  Allergies  Allergen Reactions  . Cephalexin Anaphylaxis    Pt says he has tolerated PCN in the past  . Iodinated Diagnostic Agents Anaphylaxis  . Iohexol Anaphylaxis     THROAT SWELLS AND STOPS BREATHING   . Ace Inhibitors Cough  . Metoprolol Other (See Comments)    Erectile dysfunction  . Viagra [Sildenafil Citrate] Other (See Comments)    Headache and vision changes  . Ciprofloxacin Hives, Itching and Rash  . Codeine Nausea And Vomiting and Other (See Comments)    headaches  . Sulfa Antibiotics Rash    No current facility-administered medications on file prior to encounter.   Current Outpatient Prescriptions on File Prior to Encounter  Medication Sig  . amLODipine (NORVASC) 5 MG tablet Take 5 mg by mouth daily.   . cholecalciferol (VITAMIN D) 1000 UNITS tablet Take 1,000 Units by mouth daily.  . COD LIVER OIL PO Take 1 capsule by mouth at bedtime.   Marland Kitchen desonide (DESOWEN) 0.05 % cream Apply 1 application  topically every other day. For rosacea  . losartan (COZAAR) 100 MG tablet Take 100 mg by mouth daily.   . polyethylene glycol (MIRALAX / GLYCOLAX) packet Take 17 g by mouth daily. Mix in 8 oz liquid  and drink  . pravastatin (PRAVACHOL) 80 MG tablet Take 80 mg by mouth at bedtime.   . saw palmetto 160 MG capsule Take 160 mg by mouth 2 (two) times daily.    . tadalafil (CIALIS) 5 MG tablet Take 5 mg by mouth at bedtime.   Alveda Reasons 15 MG TABS tablet TAKE 1 TABLET BY MOUTH DAILY WITH SUPPER    FAMILY HISTORY:  His indicated that his mother is deceased. He indicated that his father is deceased. He indicated that both of his sisters are alive. He indicated that both of his brothers are deceased.   SOCIAL HISTORY: He  reports that he quit smoking about 38 years ago. His smoking use included Cigarettes. He has a 35 pack-year smoking history. He has never used smokeless tobacco. He reports that he does not drink alcohol or use illicit drugs.     SUBJECTIVE:  Standing shaving RA nad    VITAL SIGNS: BP 126/63 mmHg  Pulse 62  Temp(Src) 97.8 F (36.6 C) (Oral)  Resp 20  Ht 5' 9"  (1.753 m)  Wt 167 lb 8.8 oz (76 kg)  BMI 24.73 kg/m2  SpO2 99%  HEMODYNAMICS:    VENTILATOR SETTINGS:    INTAKE / OUTPUT: I/O last 3 completed shifts: In: -  Out: 3320 [Urine:3320]  PHYSICAL EXAMINATION:  amb wm nad  Wt Readings from Last 3 Encounters:  11/27/15 167 lb 8.8 oz (76 kg)  09/01/15 178 lb (80.74 kg)  01/04/15 178 lb 12.8 oz (81.103 kg)    Vital signs reviewed  HEENT: nl dentition, turbinates, and oropharynx. Nl external ear canals without cough reflex   NECK :  without JVD/Nodes/TM/ nl carotid upstrokes bilaterally   LUNGS: no acc muscle use,  Nl contour chest with dry insp crackles both bases   CV:  RRR  no s3 or murmur or increase in P2, no edema   ABD:  soft and nontender with nl inspiratory excursion in the supine position. No bruits or organomegaly, bowel sounds nl  MS:  Nl gait/ ext warm without deformities, calf tenderness, cyanosis or clubbing No obvious joint restrictions   SKIN: warm and dry without lesions    NEURO:  alert, approp, nl sensorium with  no motor  deficits      LABS:  BMET  Recent Labs Lab 11/24/15 0320 11/25/15 0320 11/26/15 0300  NA 133* 134* 134*  K 3.6 3.9 3.6  CL 97* 96* 96*  CO2 27 29 29   BUN 22* 22* 19  CREATININE 1.44* 1.44* 1.42*  GLUCOSE 102* 112* 105*    Electrolytes  Recent Labs Lab 11/22/15 2034  11/24/15 0320 11/25/15 0320 11/26/15 0300  CALCIUM 11.2*  < > 10.2 10.8* 10.4*  MG 1.6*  --   --   --   --   < > = values in this interval not displayed.  CBC  Recent Labs Lab 11/23/15 0415 11/24/15 0650 11/25/15 0320  WBC 14.0* 13.5* 13.8*  HGB 12.7* 12.3* 13.0  HCT 37.7* 37.1* 38.6*  PLT 173 192 196    Coag's No results for input(s): APTT, INR in the last 168 hours.  Sepsis Markers No results for input(s): LATICACIDVEN, PROCALCITON, O2SATVEN in the last 168 hours.  ABG No results for input(s): PHART,  PCO2ART, PO2ART in the last 168 hours.  Liver Enzymes No results for input(s): AST, ALT, ALKPHOS, BILITOT, ALBUMIN in the last 168 hours.  Cardiac Enzymes  Recent Labs Lab 11/23/15 0415 11/23/15 0939 11/23/15 1423  TROPONINI 1.34* 0.08* 0.06*    Glucose No results for input(s): GLUCAP in the last 168 hours.     Lab Results  Component Value Date   ESRSEDRATE 94* 11/26/2015     Imaging Ct Chest Wo Contrast  11/26/2015  CLINICAL DATA:  Cough, shortness of breath, and weakness for 3 weeks. EXAM: CT CHEST WITHOUT CONTRAST TECHNIQUE: Multidetector CT imaging of the chest was performed following the standard protocol without IV contrast. COMPARISON:  Chest radiograph on 10/17/2015 and 11/08/2015 FINDINGS: Mediastinum/Lymph Nodes: Mild cardiomegaly is noted without evidence of pericardial effusion. A moderate size hiatal hernia is also seen. Mild mediastinal and probable bilateral hilar lymphadenopathy is noted. Largest index lymph node in the precarinal region measures 1.5 cm in short axis. Lungs/Pleura: There is diffuse heterogeneous bilateral airspace disease seen throughout both  lungs, involving right lung slightly worse than left. This is suspicious for diffuse pulmonary edema although infection cannot be excluded. No evidence of pleural effusion. Mild underlying emphysema noted. Upper abdomen: Severe right hydronephrosis is seen, without significant change compared to previous abdomen CT in 2015. Musculoskeletal: No chest wall mass or suspicious bone lesions identified. IMPRESSION: Diffuse bilateral heterogeneous airspace disease superimposed on mild emphysema. This is suspicious for diffuse pulmonary edema, although infection cannot definitely be excluded. Mild nonspecific mediastinal and probable bilateral hilar lymphadenopathy. This may be reactive in etiology or secondary to lymphedema. Mild cardiomegaly and moderate hiatal hernia. Chronic severe right hydronephrosis, without significant change compared to previous CT in 2015. Electronically Signed   By: Earle Gell M.D.   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    rec no further abx/ taper steroids down but not off x 2weeks (no lower than 20 mg daily ) then f/u with me with a cxr to see if can taper the rest of the way   Discussed in detail all the  indications, usual  risks and alternatives  relative to the benefits with patient /wife who agree  to proceed with conservative f/u as outlined     Christinia Gully, MD Pulmonary and [place] [phone] After 5:30 PM or weekends, call [phone]

## 2015-11-28 DIAGNOSIS — J8489 Other specified interstitial pulmonary diseases: Secondary | ICD-10-CM

## 2015-11-28 DIAGNOSIS — J181 Lobar pneumonia, unspecified organism: Secondary | ICD-10-CM

## 2015-11-28 LAB — CULTURE, RESPIRATORY W GRAM STAIN: Culture: NORMAL

## 2015-11-28 MED ORDER — PREDNISONE 20 MG PO TABS: 20.0000 mg | ORAL_TABLET | Freq: Every day | ORAL | Status: DC

## 2015-11-28 NOTE — Discharge Summary (Signed)
Physician Discharge Summary  SHELLEY POOLEY QIO:[id number] DOB: 11-Sep-1930 DOA: 11/22/2015  PCP: Marylene Land, MD  Admit date: 11/22/2015 Discharge date: 11/28/2015   Recommendations for Outpatient Follow-Up:   1. The patient will follow up with Dr. Melvyn Novas for further weaning of steroids and F/U CXR. 2. Home oxygen therapy set up. 3. ReDS Vest per cardiology. 4. Follow up ANCA, GBM, ANA. Re-check ionized calcium after holding Vitamin D for 1 month.   Discharge Diagnosis:   Principal Problem:    Acute on chronic diastolic (congestive) heart failure (HCC) Active Problems:    Essential hypertension    CAD, NATIVE VESSEL    Atrial fibrillation (HCC)    Lobar pneumonia due to unspecified organism    Acute respiratory failure with hypoxia (HCC)    Chronic kidney disease (CKD), stage III (moderate)    SOB (shortness of breath)    Interstitial lung disease (HCC)    BOOP (bronchiolitis obliterans with organizing pneumonia) ([place])    Elevated troponin    Hydronephrosis, right  Discharge disposition:  Home.    Discharge Condition: Improved.  Diet recommendation: Low sodium, heart healthy.     History of Present Illness:   Jose [name] is an 82 y.o. male with history of chronic combined systolic and diastolic CHF, CAD status post CABG, paroxysmal A. fib on Xarelto CKD stage IV, hypertension presented with progressive dyspnea for 2 weeks duration. Patient admitted for acute on chronic CHF.  Hospital Course by Problem:   Principal problem:  Acute respiratory failure with hypoxia secondary to acute on chronic diastolic CHF and BOOP Diuresed with IV lasix (negative8.8 L since admission). Still requiring 3 L oxygen at rest and on ambulation with O2 sat in low 90s. Home oxygen therefore set up. 2-D echo shows improved EF of 55- 32% with diastolic dysfunction. Continue aspirin, losartan and Coreg. Dr. Haroldine Laws recommended pulmonary evaluation given lack of  improvement despite adequate diuresis. Follow up ANCA, GBM, ANA. ESR/RF elevated. CT of the chest done 11/26/15 showed diffuse bilateral heterogeneous airspace disease superimposed on mild emphysema with nonspecific mediastinal and bilateral hilar lymphadenopathy. 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  Active problems  Atrial fibrillation Rate controlled. Continue Xarelto and beta blocker.  Elevated troponin with history of CAD s/p CABG. Troponin peaked at 1.34. No chest pain symptoms. EKG without ST T changes. Echo negative for wall motion abnormality. Cardiology recommends medical management. Continue aspirin, beta blocker and statin.  Chronic kidney disease stage III/chronic severe right hydronephrosis Baseline creatinine 1.3-1.4. Creatinine isat baseline. Continue to monitor. Hydronephrosis unchanged from prior CT done in 2015.  Possible pneumonia Was on outpatient doxycycline prior to admission and then was treated with empiric Unasyn and azithromycin in-house. Sputum culture negative to date, but received an adequate course of antibiotics with 4 days of Unasyn and 5 days of Azithromycin.  Moderate hypercalcemia possibly from Vitamin D intoxication Low PTH. PTH related peptide WNL.  Ionized calcium also slightly high. Vitamin D supplement held. Re-check after holding vitamin D for 1 month.   Medical Consultants:    Cardiology  Pulmonology   Discharge Exam:   Filed Vitals:   11/27/15 1510 11/27/15 2202  BP: 138/76 123/58  Pulse: 70 65  Temp: 97.7 F (36.5 C) 97.6 F (36.4 C)  Resp: 18 16  Filed Vitals:   11/27/15 0437 11/27/15 1416 11/27/15 1510 11/27/15 2202  BP: 126/63  138/76 123/58  Pulse: 62  70 65  Temp: 97.8 F (36.6 C) 97.6 F (36.4 C) 97.7 F (36.5 C)  97.6 F (36.4 C)  TempSrc: Oral Oral Oral Oral  Resp: 20 18 18 16   Height:      Weight: 76 kg (167 lb 8.8 oz)     SpO2: 99% 95% 96% 92%    Gen:  NAD Cardiovascular:  RRR, No M/R/G Respiratory: Crackles right base Gastrointestinal: Abdomen soft, NT/ND with normal active bowel sounds. Extremities: No C/E/C   The results of significant diagnostics from this hospitalization (including imaging, microbiology, ancillary and laboratory) are listed below for reference.     Procedures and Diagnostic Studies:   Dg Chest 2 View  11/22/2015  CLINICAL DATA:  Followup pneumonia EXAM: CHEST  2 VIEW COMPARISON:  11/08/2015 FINDINGS: Cardiac shadow remains enlarged. Postsurgical changes are again seen. Increased bilateral opacities are noted when compared with the prior exam. This likely represents a component of superimposed edema over the chronic changes seen previously. No focal confluent infiltrate or effusion is noted. No bony abnormality is seen. IMPRESSION: Diffuse increased opacities bilaterally likely related to pulmonary edema. Electronically Signed   By: Inez Catalina M.D.   On: 11/22/2015 16:10     Labs:   Basic Metabolic Panel:  Recent Labs Lab 11/22/15 2034 11/23/15 0415 11/24/15 0320 11/25/15 0320 11/26/15 0300  NA 134* 134* 133* 134* 134*  K 4.5 4.3 3.6 3.9 3.6  CL 100* 100* 97* 96* 96*  CO2 26 27 27 29 29   GLUCOSE 101* 111* 102* 112* 105*  BUN 19 19 22* 22* 19  CREATININE 1.34* 1.46* 1.44* 1.44* 1.42*  CALCIUM 11.2* 10.9* 10.2 10.8* 10.4*  MG 1.6*  --   --   --   --    GFR Estimated Creatinine Clearance: 40.1 mL/min (by C-G formula based on Cr of 1.42).  CBC:  Recent Labs Lab 11/22/15 2034 11/23/15 0415 11/24/15 0650 11/25/15 0320  WBC 16.6* 14.0* 13.5* 13.8*  NEUTROABS 14.3*  --   --   --   HGB 13.5 12.7* 12.3* 13.0  HCT 39.1 37.7* 37.1* 38.6*  MCV 88.1 88.7 88.8 88.5  PLT 178 173 192 196   Cardiac Enzymes:  Recent Labs Lab 11/22/15 2034  11/23/15 0415 11/23/15 0939 11/23/15 1423  TROPONINI 0.04* 1.34* 0.08* 0.06*   Microbiology Recent Results (from the past 240 hour(s))  Urine culture     Status: None   Collection Time: 11/24/15  8:25 AM  Result Value Ref Range Status   Specimen Description URINE, CLEAN CATCH  Final   Special Requests NONE  Final   Culture MULTIPLE SPECIES PRESENT, SUGGEST RECOLLECTION  Final   Report Status 11/25/2015 FINAL  Final  Culture, expectorated sputum-assessment     Status: None   Collection Time: 11/24/15 11:07 AM  Result Value Ref Range Status   Specimen Description SPUTUM  Final   Special Requests NONE  Final   Sputum evaluation   Final    MICROSCOPIC FINDINGS SUGGEST THAT THIS SPECIMEN IS NOT REPRESENTATIVE OF LOWER RESPIRATORY SECRETIONS. PLEASE RECOLLECT. Gram Stain Report Called to,Read Back By and Verified With: T Casilda Carls AT 1212 11/24/15 BY L BENFIELD    Report Status 11/24/2015 FINAL  Final  Culture, expectorated sputum-assessment     Status: None   Collection Time: 11/24/15  9:19 PM  Result Value Ref Range Status  Specimen Description SPUTUM  Final   Special Requests NONE  Final   Sputum evaluation   Final    THIS SPECIMEN IS ACCEPTABLE. RESPIRATORY CULTURE REPORT TO FOLLOW.   Report Status 11/24/2015 FINAL  Final  Culture, respiratory (NON-Expectorated)     Status: None (Preliminary result)   Collection Time: 11/24/15  9:19 PM  Result Value Ref Range Status   Specimen Description SPUTUM  Final   Special Requests NONE  Final   Gram Stain   Final    ABUNDANT WBC PRESENT,BOTH PMN AND MONONUCLEAR FEW SQUAMOUS EPITHELIAL CELLS PRESENT MODERATE GRAM POSITIVE COCCI IN PAIRS MODERATE GRAM NEGATIVE RODS Performed at [facility]    Culture   Final    Culture reincubated for better growth Performed at [facility]    Report Status PENDING  Incomplete     Discharge Instructions:       Discharge Instructions    ([place]) Call MD:   Anytime you have any of the following symptoms: 1) 3 pound weight gain in 24 hours or 5 pounds in 1 week 2) shortness of breath, with or without a dry hacking cough 3) swelling in the hands, feet or stomach 4) if you have to sleep on extra pillows at night in order to breathe.    Complete by:  As directed      Call MD for:  extreme fatigue    Complete by:  As directed      Call MD for:  temperature >100.4    Complete by:  As directed      Diet - low sodium heart healthy    Complete by:  As directed      Face-to-face encounter (required for Medicare/Medicaid patients)    Complete by:  As directed   I Lenola Lockner certify that this patient is under my care and that I, or a nurse practitioner or physician's assistant working with me, had a face-to-face encounter that meets the physician face-to-face encounter requirements with this patient on 11/28/2015. The encounter with the patient was in whole, or in part for the following medical condition(s) which is the primary reason for home health care (List medical condition): BOOP, new oxygen requirement, deconditioning/activity intolerance due to dyspnea  The encounter with the patient was in whole, or in part, for the following medical condition, which is the primary reason for home health care:  deconditioning  I certify that, based on my findings, the following services are medically necessary home health services:  Physical therapy  Reason for Medically Necessary Home Health Services:  Therapy- Therapeutic Exercises to Increase Strength and Endurance  My clinical findings support the need for the above services:  Shortness of breath with activity  Further, I certify that my clinical findings support that this patient is homebound due to:  Shortness of Breath with activity     For home use only DME oxygen    Complete by:  As directed   Mode or (Route):  Nasal cannula  Liters per Minute:  2  Frequency:  Continuous (stationary and portable oxygen unit  needed)  Oxygen conserving device:  Yes  Oxygen delivery system:  Gas     Home Health    Complete by:  As directed   To provide the following care/treatments:  PT     Increase activity slowly    Complete by:  As directed             Medication List    STOP  taking these medications        amLODipine 5 MG tablet  Commonly known as:  NORVASC      TAKE these medications        carvedilol 3.125 MG tablet  Commonly known as:  COREG  Take 1 tablet (3.125 mg total) by mouth 2 (two) times daily with a meal.     cholecalciferol 1000 units tablet  Commonly known as:  VITAMIN D  Take 1,000 Units by mouth daily.     COD LIVER OIL PO  Take 1 capsule by mouth at bedtime.     desonide 0.05 % cream  Commonly known as:  DESOWEN  Apply 1 application topically every other day. For rosacea     furosemide 40 MG tablet  Commonly known as:  LASIX  Take 1 tablet (40 mg total) by mouth daily.     guaiFENesin 600 MG 12 hr tablet  Commonly known as:  MUCINEX  Take 1 tablet (600 mg total) by mouth 2 (two) times daily.     guaiFENesin-dextromethorphan 100-10 MG/5ML syrup  Commonly known as:  ROBITUSSIN DM  Take 5 mLs by mouth every 4 (four) hours as needed for cough.     losartan 100 MG tablet  Commonly known as:  COZAAR  Take 100 mg by mouth daily.     multivitamin with minerals Tabs tablet  Take 1 tablet by mouth daily.     omeprazole 20 MG tablet  Commonly known as:  PRILOSEC OTC  Take 20 mg by mouth daily.     polyethylene glycol packet  Commonly known as:  MIRALAX / GLYCOLAX  Take 17 g by mouth daily. Mix in 8 oz liquid and drink     pravastatin 80 MG tablet  Commonly known as:  PRAVACHOL  Take 80 mg by mouth at bedtime.     predniSONE 20 MG tablet  Commonly known as:  DELTASONE  Take 1 tablet (20 mg total) by mouth daily with breakfast.     PROAIR RESPICLICK 076 (90 Base) MCG/ACT Aepb  Generic drug:  Albuterol Sulfate  Inhale 2 puffs into the lungs every 4 (four)  hours as needed (shortness of breath/ wheezing).     saw palmetto 160 MG capsule  Take 160 mg by mouth 2 (two) times daily.     SYSTANE ULTRA OP  Place 1 drop into both eyes 2 (two) times daily.     tadalafil 5 MG tablet  Commonly known as:  CIALIS  Take 5 mg by mouth at bedtime.     TESTOSTERONE TD  Place 1 mL onto the skin daily. Apply every morning to back shoulder - 10% lipoderm cream compounded at [facility] 15 MG Tabs tablet  Generic drug:  Rivaroxaban  TAKE 1 TABLET BY MOUTH DAILY WITH SUPPER       Follow-up Information    Follow up with Marylene Land, MD. Schedule an appointment as soon as possible for a visit in 1 week.   Specialty:  Family Medicine   Contact information:   [place] [address] [phone]       Follow up with MCALHANY,CHRISTOPHER, MD. Schedule an appointment as soon as possible for a visit in 3 weeks.   Specialty:  Cardiology   Contact information:   [place] [address] [place] [address] [phone]       Follow up with [facility].   Why:  HH-RN/PT arranged-  they will call you to arrange home visits   Contact information:   [address] [place] [address] [phone]       Follow up with [facility].   Why:  Home 02 arranged- portable tank to be delivered to room prior to discharge   Contact information:   [address] [place] [address] [phone]       Follow up with Christinia Gully, MD. Schedule an appointment as soon as possible for a visit in 2 weeks.   Specialty:  Pulmonary Disease   Why:  Hospital follow up.   Contact information:   [address] [place] [address] [phone]        Time coordinating discharge: 40 minutes.  Signed:  Abiel Antrim  Pager [phone] [facility] 11/28/2015, 10:39 AM

## 2015-11-28 NOTE — Evaluation (Signed)
Physical Therapy Evaluation Patient Details Name: Jose [name] MRN: [id number] DOB: [birth year] Today's Date: 11/28/2015   History of Present Illness   82 y.o. male with past medical history of CAD (s/p CABG in 2007 w/ LIMA-LAD, SVG-D1, SVG-1st & 2nd Mrg, SVG-PDA and posterior lateral with PFO closure at that time), chronic diastolic CHF (EF 45 - 10% by echo in 2014), CVA (06/2013), HTN, HLD, and PAF (on Xarelto).  He presented to [facility] on 11/22/2015 as a direct admit from his PCP's office for worsening shortness of breath for the past two weeks. Pertinent labs on admission include BNP 339. CXR with diffuse infiltrates. ReDS Vest 58%.  Chest CT 3/31 with diffuse infiltrates. ESR 94 RF markedly elevated. Started on solumedrol on 11/26/15. Feels much better. Breathing improved. Cough resolved for first time in months. Slept well.    Clinical Impression  Pt presents at/near his baseline level of functional activity, limited primarily by cardiopulmonary dysfunction and mild LE weakness likely due to decr activity level and effects of illness.  Pt active at baseline and motivated to return to that level.  SaO2 monitored with activity, REST: 96%, 54 bpm; WALK x 2-3 minutes: 87-88%, 76 bpm; rebounds to 95%, 55 bpm after 2-3 min seated rest.  Pt will benefit from supplemental oxygen use during activity, HHPT to assist with return to prior functional level at d/c.  MD present at end of session to confirm.  No further acute PT needs at this time.     Follow Up Recommendations Home health PT    Equipment Recommendations  None recommended by PT    Recommendations for Other Services       Precautions / Restrictions Precautions Precautions: None      Mobility  Bed Mobility Overal bed mobility: Independent             General bed mobility comments: up at EOB on arrival  Transfers Overall transfer level: Independent Equipment used: None             General transfer comment: no  difficulties or concerns with sit<>stand  Ambulation/Gait Ambulation/Gait assistance: Modified independent (Device/Increase time) Ambulation Distance (Feet): 225 Feet Assistive device: None Gait Pattern/deviations: Step-through pattern Gait velocity: unmeasured, decr  Gait velocity interpretation: Below normal speed for age/gender General Gait Details: self-selects slower speed to manage SOB symptoms, no obvious deficits or reported feeling of instabilty outside of residual effects of prolonged bedrest/acute illness, expect to recover; see General Comments for details of cardiopulmonary response  Stairs            Wheelchair Mobility    Modified Rankin (Stroke Patients Only)       Balance Overall balance assessment: Modified Independent                                           Pertinent Vitals/Pain Pain Assessment: No/denies pain    Home Living Family/patient expects to be discharged to:: Private residence Living Arrangements: Spouse/significant other Available Help at Discharge: Family;Available 24 hours/day Type of Home: Independent living facility Home Access: Level entry     Home Layout: One level Home Equipment: None      Prior Function Level of Independence: Independent         Comments: walks for exercise, plays golf 2-3x/week, anxious to return to PLOF     Hand Dominance   Dominant Hand: Right  Extremity/Trunk Assessment   Upper Extremity Assessment: Overall WFL for tasks assessed           Lower Extremity Assessment: Overall WFL for tasks assessed      Cervical / Trunk Assessment: Normal  Communication   Communication: No difficulties  Cognition Arousal/Alertness: Awake/alert Behavior During Therapy: WFL for tasks assessed/performed Overall Cognitive Status: Within Functional Limits for tasks assessed                      General Comments General comments (skin integrity, edema, etc.): PaSO2  monitored: REST: 96%/54bpm; ACTIVITY: decr to 87-88% over 2-3 minutes, HR incr to mid-70's; return to seated rest, rebounds to 95% and 54 bpm after 2-3 mintues; pt educated on utility of and need for supplemental O2, MD present to confirm    Exercises        Assessment/Plan    PT Assessment All further PT needs can be met in the next venue of care  PT Diagnosis Difficulty walking   PT Problem List Cardiopulmonary status limiting activity;Decreased mobility;Decreased activity tolerance  PT Treatment Interventions     PT Goals (Current goals can be found in the Care Plan section) Acute Rehab PT Goals Patient Stated Goal: play golf again with friends PT Goal Formulation: All assessment and education complete, DC therapy    Frequency     Barriers to discharge        Co-evaluation               End of Session Equipment Utilized During Treatment: Gait belt Activity Tolerance: Patient tolerated treatment well Patient left: in chair;with family/visitor present;with nursing/sitter in room Nurse Communication: Mobility status         Time: 4591-3685 PT Time Calculation (min) (ACUTE ONLY): 30 min   Charges:   PT Evaluation $PT Eval Moderate Complexity: 1 Procedure PT Treatments $Self Care/Home Management: 8-22   PT G Codes:        Herbie Drape 11/28/2015, 10:13 AM

## 2015-11-28 NOTE — Progress Notes (Addendum)
PULMONARY / CRITICAL CARE MEDICINE   Name: Jose [name] MRN: [id number] DOB: [birth year]    ADMISSION DATE:  11/22/2015 CONSULTATION DATE:  11/27/15  REFERRING MD:   Venia Carbon  CHIEF COMPLAINT:  Sob   HISTORY OF PRESENT ILLNESS:   53 yowm remote smoker acutely ill x 6 weeks pta with sob/cough variably yellow already rx with approp abx for CAP by PC admitted with diffuse pulmonary infiltrates and high ESR and started on solumerol > pccm service asked to see am 4/1   SUBJECTIVE:  Nad/ ?"when can I stop the oxygen"  VITAL SIGNS: BP 123/58 mmHg  Pulse 65  Temp(Src) 97.6 F (36.4 C) (Oral)  Resp 16  Ht _0  (1.753 m)  Wt 167 lb 8.8 oz (76 kg)  BMI 24.73 kg/m2  SpO2 92% on 3lpm         INTAKE / OUTPUT: I/O last 3 completed shifts: In: 480 [P.O.:480] Out: 2420 [Urine:2420]  PHYSICAL EXAMINATION:  amb wm nad  Wt Readings from Last 3 Encounters:  11/27/15 167 lb 8.8 oz (76 kg)  09/01/15 178 lb (80.74 kg)  01/04/15 178 lb 12.8 oz (81.103 kg)    Vital signs reviewed  HEENT: nl dentition, turbinates, and oropharynx. Nl external ear canals without cough reflex   NECK :  without JVD/Nodes/TM/ nl carotid upstrokes bilaterally   LUNGS: no acc muscle use,  Nl contour chest with dry insp crackles both bases   CV:  RRR  no s3 or murmur or increase in P2, no edema   ABD:  soft and nontender with nl inspiratory excursion in the supine position. No bruits or organomegaly, bowel sounds nl  MS:  Nl gait/ ext warm without deformities, calf tenderness, cyanosis or clubbing No obvious joint restrictions   SKIN: warm and dry without lesions    NEURO:  alert, approp, nl sensorium with  no motor deficits      LABS:  BMET  Recent Labs Lab 11/24/15 0320 11/25/15 0320 11/26/15 0300  NA 133* 134* 134*  K 3.6 3.9 3.6  CL 97* 96* 96*  CO2 _1 BUN 22* 22* 19  CREATININE 1.44* 1.44* 1.42*  GLUCOSE 102* 112* 105*    Electrolytes  Recent Labs Lab  11/22/15 2034  11/24/15 0320 11/25/15 0320 11/26/15 0300  CALCIUM 11.2*  < > 10.2 10.8* 10.4*  MG 1.6*  --   --   --   --   < > = values in this interval not displayed.  CBC  Recent Labs Lab 11/23/15 0415 11/24/15 0650 11/25/15 0320  WBC 14.0* 13.5* 13.8*  HGB 12.7* 12.3* 13.0  HCT 37.7* 37.1* 38.6*  PLT 173 192 196    Coag's No results for input(s): APTT, INR in the last 168 hours.  Sepsis Markers No results for input(s): LATICACIDVEN, PROCALCITON, O2SATVEN in the last 168 hours.  ABG No results for input(s): PHART, PCO2ART, PO2ART in the last 168 hours.  Liver Enzymes No results for input(s): AST, ALT, ALKPHOS, BILITOT, ALBUMIN in the last 168 hours.  Cardiac Enzymes  Recent Labs Lab 11/23/15 0415 11/23/15 0939 11/23/15 1423  TROPONINI 1.34* 0.08* 0.06*    Glucose No results for input(s): GLUCAP in the last 168 hours.     Lab Results  Component Value Date   ESRSEDRATE 94* 11/26/2015     Imaging No results found.       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    rec no further abx/ taper steroids down but not off x 2weeks (no lower than 20 mg daily ) then f/u with me with a cxr to see if can taper the rest of the way   Discussed with Pt: The goal with a chronic steroid dependent illness is always arriving at the lowest effective dose  that controls the disease/symptoms and not accepting a set "formula" which is based on statistics or guidelines that don't always take into account patient  variability or the natural hx of the dz in every individual patient, which may well vary over time.  For now therefore I recommend the patient maintain  20 mg floor but how we get down to that floor is highly debatable but overall strategy is to use the lowest dose that works - same for the 02 for sat > 90%  We will see again prn as inpt      Christinia Gully, MD Pulmonary and [place] [phone] After 5:30 PM or weekends, call [phone]

## 2015-11-28 NOTE — Progress Notes (Signed)
Advanced Heart Failure Team Rounding Note    Subjective    Feels much better with steroids. 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    Objective:    Vital Signs:   Temp:  [97.6 F (36.4 C)-97.7 F (36.5 C)] 97.6 F (36.4 C) (04/01 2202) Pulse Rate:  [65-70] 65 (04/01 2202) Resp:  [16-18] 16 (04/01 2202) BP: (123-138)/(58-76) 123/58 mmHg (04/01 2202) SpO2:  [92 %-96 %] 92 % (04/01 2202) Last BM Date: 11/27/15  Weight change: Filed Weights   11/25/15 0515 11/26/15 0552 11/27/15 0437  Weight: 75.796 kg (167 lb 1.6 oz) 75.615 kg (166 lb 11.2 oz) 76 kg (167 lb 8.8 oz)    Intake/Output:   Intake/Output Summary (Last 24 hours) at 11/28/15 0755 Last data filed at 11/27/15 2203  Gross per 24 hour  Intake    480 ml  Output    750 ml  Net   -270 ml     Physical Exam: General:  Elderly appearing HEENT: normal Neck: supple. JVP 6-7. Carotids 2+ bilat; no bruits. No lymphadenopathy or thyromegaly appreciated. Cor: PMI nondisplaced. Regular rate & rhythm. No rubs, gallops or murmurs. Lungs: Diffuse  crackles, R>L Abdomen: soft, NT, ND, no HSM. No bruits or masses. +BS  Extremities: no cyanosis, clubbing, rash, edema Neuro: alert & orientedx3, cranial nerves grossly intact. moves all 4 extremities w/o difficulty. Affect pleasant  Telemetry: NSR 50-60s  Labs: Basic Metabolic Panel:  Recent Labs Lab 11/22/15 2034 11/23/15 0415 11/24/15 0320 11/25/15 0320 11/26/15 0300  NA 134* 134* 133* 134* 134*  K 4.5 4.3 3.6 3.9 3.6  CL 100* 100* 97* 96* 96*  CO2 26 27 27 29 29   GLUCOSE 101* 111* 102* 112* 105*  BUN 19 19 22* 22* 19  CREATININE 1.34* 1.46* 1.44* 1.44* 1.42*  CALCIUM 11.2* 10.9* 10.2 10.8* 10.4*  MG 1.6*  --   --   --   --     Liver Function Tests: No results for input(s): AST, ALT, ALKPHOS, BILITOT, PROT, ALBUMIN in the last 168 hours. No results for input(s): LIPASE, AMYLASE in the last 168 hours. No results for input(s): AMMONIA in the last 168  hours.  CBC:  Recent Labs Lab 11/22/15 2034 11/23/15 0415 11/24/15 0650 11/25/15 0320  WBC 16.6* 14.0* 13.5* 13.8*  NEUTROABS 14.3*  --   --   --   HGB 13.5 12.7* 12.3* 13.0  HCT 39.1 37.7* 37.1* 38.6*  MCV 88.1 88.7 88.8 88.5  PLT 178 173 192 196    Cardiac Enzymes:  Recent Labs Lab 11/22/15 2034 11/23/15 0415 11/23/15 0939 11/23/15 1423  TROPONINI 0.04* 1.34* 0.08* 0.06*    BNP: BNP (last 3 results)  Recent Labs  11/22/15 2034  BNP 339.2*    ProBNP (last 3 results) No results for input(s): PROBNP in the last 8760 hours.   CBG: No results for input(s): GLUCAP in the last 168 hours.  Coagulation Studies: No results for input(s): LABPROT, INR in the last 72 hours.  Other results: EKG: 11/22/15 NSR with PVCs  Imaging: Ct Chest Wo Contrast  11/26/2015  CLINICAL DATA:  Cough, shortness of breath, and weakness for 3 weeks. EXAM: CT CHEST WITHOUT CONTRAST TECHNIQUE: Multidetector CT imaging of the chest was performed following the standard protocol without IV contrast. COMPARISON:  Chest radiograph on 10/17/2015 and 11/08/2015 FINDINGS: Mediastinum/Lymph Nodes: Mild cardiomegaly is noted without evidence of pericardial effusion. A moderate size hiatal hernia is also seen. Mild mediastinal and probable bilateral hilar lymphadenopathy  is noted. Largest index lymph node in the precarinal region measures 1.5 cm in short axis. Lungs/Pleura: There is diffuse heterogeneous bilateral airspace disease seen throughout both lungs, involving right lung slightly worse than left. This is suspicious for diffuse pulmonary edema although infection cannot be excluded. No evidence of pleural effusion. Mild underlying emphysema noted. Upper abdomen: Severe right hydronephrosis is seen, without significant change compared to previous abdomen CT in 2015. Musculoskeletal: No chest wall mass or suspicious bone lesions identified. IMPRESSION: Diffuse bilateral heterogeneous airspace disease  superimposed on mild emphysema. This is suspicious for diffuse pulmonary edema, although infection cannot definitely be excluded. Mild nonspecific mediastinal and probable bilateral hilar lymphadenopathy. This may be reactive in etiology or secondary to lymphedema. Mild cardiomegaly and moderate hiatal hernia. Chronic severe right hydronephrosis, without significant change compared to previous CT in 2015. Electronically Signed   By: Myles Rosenthal M.D.   On: 11/26/2015 16:16     Medications:     Current Medications: . aspirin  81 mg Oral Daily  . azithromycin  500 mg Intravenous Q24H  . carvedilol  3.125 mg Oral BID WC  . furosemide  40 mg Intravenous Daily  . guaiFENesin  600 mg Oral BID  . losartan  50 mg Oral Daily  . methylPREDNISolone (SOLU-MEDROL) injection  60 mg Intravenous Q24H  . pantoprazole  40 mg Oral Daily  . polyvinyl alcohol  1 drop Both Eyes BID  . pravastatin  80 mg Oral QHS  . Rivaroxaban  15 mg Oral Q supper    Infusions:     Assessment   1. Chronic diastolic HF 2. Severe dyspnea with abnormal  CXR - suspect acute pneumonitis 3. CAD 4. PAF  Plan     Likely post-viral pneumonitis. Plan per Pulmonary. I told him that he may get fluid retention with steroids and can take extra lasix as needed.   He will need ReDS Vest reading just before d/c so please let us know prior to d/c. (Unclear if going home today yet)  HF team will sign off.    Bensimhon, Daniel,MD 7:55 AM

## 2015-11-28 NOTE — Progress Notes (Signed)
ReDS Vest Discharge Study  Patient has had repeat ReDS vest reading and the new reading is 49.  Validated by Dweep with [employer].  Dr Gala RomneyBensimhon made aware.  Thank You   The research team

## 2015-11-29 LAB — ANCA TITERS
Atypical P-ANCA titer: <1:20 {titer}
C-ANCA: <1:20 {titer}
P-ANCA: <1:20 {titer}

## 2015-11-29 LAB — ANTINUCLEAR ANTIBODIES, IFA: ANTINUCLEAR ANTIBODIES, IFA: NEGATIVE

## 2015-11-29 LAB — GLOMERULAR BASEMENT MEMBRANE ANTIBODIES: GBM Ab: 4 units (ref 0–20)

## 2015-11-29 LAB — MPO/PR-3 (ANCA) ANTIBODIES: Myeloperoxidase Abs: <9 U/mL (ref 0.0–9.0)

## 2015-12-01 ENCOUNTER — Telehealth: Payer: Self-pay | Admitting: Cardiology

## 2015-12-01 ENCOUNTER — Telehealth: Payer: Self-pay | Admitting: Adult Health

## 2015-12-01 ENCOUNTER — Telehealth: Payer: Self-pay | Admitting: Internal Medicine

## 2015-12-01 DIAGNOSIS — J849 Interstitial pulmonary disease, unspecified: Secondary | ICD-10-CM

## 2015-12-01 MED ORDER — FIRST-DUKES MOUTHWASH MT SUSP: 10.0000 mL | Freq: Four times a day (QID) | OROMUCOSAL | Status: DC | PRN

## 2015-12-01 MED ORDER — MAGIC MOUTHWASH: 10.0000 mL | Freq: Four times a day (QID) | ORAL | Status: DC | PRN

## 2015-12-01 NOTE — Telephone Encounter (Signed)
Spoke to WorthingtonLeslie at Dr. Thurston HoleWert's office and explained request for humidified air. She notes this would be feasible - will discuss w/ Dr. Sherene SiresWert and call patient w/ recommendations.  I have contacted pt & informed him they will contact him.  Pt has numbers for our office and Dr. Thurston HoleWert's office and aware to call if new concerns.

## 2015-12-01 NOTE — Telephone Encounter (Signed)
LM for Harrold Donathathan to return call

## 2015-12-01 NOTE — Telephone Encounter (Signed)
Pt is calling in wanting to speak with a nurse about the Xarelto medication and the effects it is currently having on him. Please f/u with him  Thanks

## 2015-12-01 NOTE — Telephone Encounter (Signed)
[facility]  He states that the pt has been c/o nose bleeds  He is on xarelto  He wonders if adding humidity to o2 would help  Pt was seen in the hospital by MW and had pending HFU appt with TP for 12/16/15  I called and spoke with the pt and advised will ask MW about adding humidity to o2  Pt states he is also concerned about redness and soreness of tongue- wonders if he may have trush Dr Sherene SiresWert, please advise, thanks!

## 2015-12-01 NOTE — Telephone Encounter (Signed)
Pharmacist calling about formulation of Magic Mouthwash - advised that it should be the [facility].  This has been changed and will be filled. Nothing further needed.

## 2015-12-01 NOTE — Telephone Encounter (Signed)
Spoke with Jose [name] - [facility] Need to send an order to DME to add Humidity bottle to O2  Called pt, aware that orders are being sent and he can pick up his Rx from the pharmacy. Nothing further needed.

## 2015-12-01 NOTE — Telephone Encounter (Signed)
Recently d/c'ed from hospital.  He came home w/ home O2 to use PRN.  He is concerned about some recurrent nosebleeds he's been having since using the O2.  Notes he takes Xarelto daily. Notes no other abnormal bleeding. He is aware the O2 can dry his nasal passages out.  Reviewed chart. Pt to see pulmonary for follow up on 4/20 - Dr. Sherene SiresWert consulted on him while in hospital.  I did recommend to patient that pulmonary would be most appropriate to handle this, and that I would reach out to them for assistance.  I placed call to [facility] to see if I could speak to either Rubye Oaksammy Parrett, NP who pt is scheduled to see for his hosp f/u visit, or a triage RN - concerning getting humified O2 order or other intervention, as appropriate.  Nurse was not available and I left msg w/ operator requesting callback.

## 2015-12-01 NOTE — Telephone Encounter (Signed)
Ok for 02 rx magic mouthwash x 8 oz 2 tsp qid prn

## 2015-12-01 NOTE — Telephone Encounter (Signed)
Jose [name] returning call.Jose GriffinsStanley A [name]

## 2015-12-01 NOTE — Telephone Encounter (Signed)
LVM for Jose [name] to return call.

## 2015-12-01 NOTE — Addendum Note (Signed)
Addended by: Karalee HeightOX, Lirio Bach P on: 12/01/2015 02:46 PM   Modules accepted: Orders

## 2015-12-05 ENCOUNTER — Telehealth: Payer: Self-pay | Admitting: Internal Medicine

## 2015-12-05 NOTE — Telephone Encounter (Signed)
Discharged after hosp for pneumonia and CHF. Instruction on sheet to taper prednisone from 60 mg daily then continue pred 20 mg daily till see Dr Sherene SiresWert on 4/20. Instruction on pill bottle just said 1 x 20 mg daily, which is what he has been doing. He feels better. Rec- continue prednisone 20 mg daily until OV

## 2015-12-07 ENCOUNTER — Other Ambulatory Visit: Payer: Self-pay | Admitting: Family Medicine

## 2015-12-07 ENCOUNTER — Ambulatory Visit
Admission: RE | Admit: 2015-12-07 | Discharge: 2015-12-07 | Disposition: A | Discharge status: Home / Self Care | Payer: Medicare Other | Source: Ambulatory Visit | Attending: Family Medicine | Admitting: Family Medicine

## 2015-12-07 ENCOUNTER — Encounter: Payer: Self-pay | Admitting: Cardiology

## 2015-12-07 DIAGNOSIS — Z09 Encounter for follow-up examination after completed treatment for conditions other than malignant neoplasm: Secondary | ICD-10-CM

## 2015-12-13 ENCOUNTER — Telehealth: Payer: Self-pay | Admitting: Cardiology

## 2015-12-13 NOTE — Telephone Encounter (Signed)
Received records from Dr Mosetta PuttPeter Blomgren for appointment on 12/15/15 with Dr Jens Somrenshaw [facility] .  [facility]) for Dr Ludwig Clarksrenshaw's schedule on 12/15/15. lp

## 2015-12-14 NOTE — Progress Notes (Signed)
HPI: FU CAD and atrial fibrillation. Patient underwent coronary artery bypass and graft in 2007. He had a LIMA to the LAD, saphenous vein graft to first diagonal, sequential saphenous vein graft to the first marginal and second marginal, sequential saphenous vein graft to the PDA and posterior lateral. He also had closure of PFO. Abdominal ultrasound May 2014 showed no aneurysm. Had CVA in November of 2014. Transesophageal echocardiogram November 2014 showed normal LV function, mild prolapse of the posterior mitral valve leaflet and an atrial septal aneurysm with negative bubble study. Carotid Dopplers November 2014 showed less than 40% bilateral stenosis. 30 day event monitor showed atrial fibrillation. Nuclear study in September 2015 showed an ejection fraction of 53%. Inferior basal scar but no ischemia. Echocardiogram March 2017 showed normal LV systolic function, grade 1 diastolic dysfunction, moderate left atrial enlargement and mild tricuspid regurgitation. Patient admitted with acute on chronic congestive heart failure in April 2017. Patient was diuresed but continued to be short of breath. 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 Troponin was elevated at 1.34 and was treated medically.Since last seen, Patient is slowly improving. He has some dyspnea on exertion but better as his pneumonia has improved. No orthopnea, PND, pedal edema, chest pain or syncope.  Current Outpatient Prescriptions  Medication Sig Dispense Refill  . Albuterol Sulfate (PROAIR RESPICLICK) 108 (90 Base) MCG/ACT AEPB Inhale 2 puffs into the lungs every 4 (four) hours as needed (shortness of breath/ wheezing).    . carvedilol (COREG) 3.125 MG tablet Take 1 tablet (3.125 mg total) by mouth 2 (two) times daily with a meal. 60 tablet 0  . COD LIVER OIL PO Take 1 capsule by mouth at bedtime.     Marland Kitchen. desonide (DESOWEN) 0.05 % cream Apply 1 application topically every  other day. For rosacea  0  . Diphenhyd-Hydrocort-Nystatin (FIRST-DUKES MOUTHWASH) SUSP Use as directed 10 mLs in the mouth or throat 4 (four) times daily as needed (Swich, Gargle and Swallow). 240 mL 0  . furosemide (LASIX) 40 MG tablet Take 1 tablet (40 mg total) by mouth daily. 30 tablet 0  . guaiFENesin (MUCINEX) 600 MG 12 hr tablet Take 1 tablet (600 mg total) by mouth 2 (two) times daily. 10 tablet 0  . guaiFENesin-dextromethorphan (ROBITUSSIN DM) 100-10 MG/5ML syrup Take 5 mLs by mouth every 4 (four) hours as needed for cough. 118 mL 0  . losartan (COZAAR) 100 MG tablet Take 100 mg by mouth daily.     . Multiple Vitamin (MULTIVITAMIN WITH MINERALS) TABS tablet Take 1 tablet by mouth daily.    Marland Kitchen. omeprazole (PRILOSEC OTC) 20 MG tablet Take 20 mg by mouth daily.    Bertram Gala. Polyethyl Glycol-Propyl Glycol (SYSTANE ULTRA OP) Place 1 drop into both eyes 2 (two) times daily.    . polyethylene glycol (MIRALAX / GLYCOLAX) packet Take 17 g by mouth daily. Mix in 8 oz liquid and drink    . pravastatin (PRAVACHOL) 80 MG tablet Take 80 mg by mouth at bedtime.     . predniSONE (DELTASONE) 20 MG tablet Take 1 tablet (20 mg total) by mouth daily with breakfast. 40 tablet 0  . saw palmetto 160 MG capsule Take 160 mg by mouth 2 (two) times daily.      . tadalafil (CIALIS) 5 MG tablet Take 5 mg by mouth at bedtime.     . TESTOSTERONE TD Place 1 mL onto the skin daily. Apply every morning to  back shoulder - 10% lipoderm cream compounded at [employer]    . XARELTO 15 MG TABS tablet TAKE 1 TABLET BY MOUTH DAILY WITH SUPPER 90 tablet 0   No current facility-administered medications for this visit.     Past Medical History  Diagnosis Date  . Hypertension   . Other malaise and fatigue   . Paroxysmal atrial fibrillation (HCC)   . History of embolic stroke no residual    11/ 2014  --  right PCA and MCA branch infarts (left side vision difficulties)  . Bilateral carotid artery stenosis     mild bilateral  proximcal ICA  40% per duplex 11/ 2014  . S/P CABG x 6     2007  . GERD (gastroesophageal reflux disease)   . Ureteral obstruction, right   . At risk for sleep apnea     STOP-BANG= 5   SENT TO PCP 05-20-2014  . ED (erectile dysfunction)   . Coronary artery disease     a. s/p CABG in 2007 w/ LIMA-LAD, SVG-D1, SVG-1st & 2nd Mrg, SVG-PDA and posterior lateral  . Eye infection 12/2014    Past Surgical History  Procedure Laterality Date  . Tee without cardioversion N/A 07/17/2013    Procedure: TRANSESOPHAGEAL ECHOCARDIOGRAM (TEE);  Surgeon: Wendall Stade, MD;  Location: [facility] ENDOSCOPY;  Service: Cardiovascular;  Laterality: N/A;  normal LV size, mild prolapse of posterior mitral valve leaflet,  mild MR and TR, normal AV,  no LAA thrombus,  atrial septal aneurysm with no PFO/ ASD and negative bubble study, normal RV, normal aorta with no debris  . Laparoscopic cholecystectomy  10-02-2000  . Coronary artery bypass graft  11-01-2005  dr Cornelius Moras    CLOSURE OF PFO/  LIMA to LAD,  SVG to D1,  SVG to 1st & 2nd  MARGINAL branches, SVG  to PDA and posterior lateral  . Inguinal hernia repair Bilateral   . Cardiac catheterization  10-31-2005  dr wall    severe three vessel/  perserved LV  . Cardiovascular stress test  last one 05-19-2014  dr Jens Som    low risk lexiscan no exercise study/ small inferobasal infarct with no ischemia /  ef 53%  . Appendectomy  1950's  . Right ureter obstruction surgery  age 38  . Cystoscopy w/ ureteral stent placement Right 05/21/2014    Procedure: CYSTOSCOPY WITH RETROGRADE PYELOGRAM/URETERAL STENT PLACEMENT;  Surgeon: Kathi Ludwig, MD;  Location: [facility];  Service: Urology;  Laterality: Right;  . Cystoscopy with ureteroscopy Right 05/21/2014    Procedure: CYSTOSCOPY WITH URETEROSCOPY;  Surgeon: Kathi Ludwig, MD;  Location: [facility];  Service: Urology;  Laterality: Right;    Social History   Social History  . Marital  Status: Married    Spouse Name: maxine  . Number of Children: 4  . Years of Education: college4   Occupational History  . retired    Social History Main Topics  . Smoking status: Former Smoker -- 1.00 packs/day for 35 years    Types: Cigarettes    Quit date: 08/29/1977  . Smokeless tobacco: Never Used  . Alcohol Use: No  . Drug Use: No  . Sexual Activity: Not on file   Other Topics Concern  . Not on file   Social History Narrative   Patient is married with 4 children.   Patient is right handed.   Patient has college education.   Patient drinks decaff coffee.    Family History  Problem Relation Age of Onset  . Heart disease Mother     PPM  . Hypertension Mother   . Lymphoma Father   . Prostate cancer Father   . Prostate cancer Brother   . Aneurysm Brother     REPAIR  . CAD Brother     CABG  . Other Brother     STENTS IN LEGS  . Other Sister     STOMACH ISSUES  . Depression Sister   . Lung cancer Brother     Asbestosis    ROS: no fevers or chills, productive cough, hemoptysis, dysphasia, odynophagia, melena, hematochezia, dysuria, hematuria, rash, seizure activity, orthopnea, PND, pedal edema, claudication. Remaining systems are negative.  Physical Exam: Well-developed well-nourished in no acute distress.  Skin is warm and dry.  HEENT is normal.  Neck is supple.  Chest is clear to auscultation with normal expansion.  Cardiovascular exam is regular rate and rhythm.  Abdominal exam nontender or distended. No masses palpated. Extremities show no edema. neuro grossly intact

## 2015-12-15 ENCOUNTER — Encounter: Payer: Self-pay | Admitting: Cardiology

## 2015-12-15 ENCOUNTER — Inpatient Hospital Stay: Payer: Medicare Other | Admitting: Acute Care

## 2015-12-15 ENCOUNTER — Ambulatory Visit (INDEPENDENT_AMBULATORY_CARE_PROVIDER_SITE_OTHER): Payer: Medicare Other | Admitting: Cardiology

## 2015-12-15 VITALS — BP 109/65 | HR 57 | Ht 69.0 in | Wt 160.8 lb

## 2015-12-15 DIAGNOSIS — I25810 Atherosclerosis of coronary artery bypass graft(s) without angina pectoris: Secondary | ICD-10-CM

## 2015-12-15 DIAGNOSIS — I4891 Unspecified atrial fibrillation: Secondary | ICD-10-CM

## 2015-12-15 MED ORDER — CARVEDILOL 3.125 MG PO TABS: 3.1250 mg | ORAL_TABLET | Freq: Two times a day (BID) | ORAL | Status: DC

## 2015-12-15 MED ORDER — FUROSEMIDE 20 MG PO TABS: 20.0000 mg | ORAL_TABLET | Freq: Every day | ORAL | Status: DC

## 2015-12-15 NOTE — Patient Instructions (Signed)
Medication Instructions:   DECREASE FUROSEMIDE TO 20 MG ONCE DAILY= CALL IF WEIGHT INCREASES OR SHORTNESS OF BREATH OR SWELLING OCCURS AFTER DECREASE  Labwork:  Your physician recommends that you return for lab work in: ONE WEEK  Follow-Up:  Your physician recommends that you schedule a follow-up appointment in: 8 WEEKS WITH DR Jens SomRENSHAW

## 2015-12-15 NOTE — Assessment & Plan Note (Signed)
Continue statin.No aspirin given need for anticoagulation. 

## 2015-12-15 NOTE — Assessment & Plan Note (Signed)
Continue statin. 

## 2015-12-15 NOTE — Assessment & Plan Note (Signed)
Recent mild elevation in troponin likely related to pneumonia.We will consider repeating nuclear study in the future after he fully recovers from recent pneumonia.

## 2015-12-15 NOTE — Assessment & Plan Note (Signed)
Blood pressure controlled. Continue present medications. 

## 2015-12-15 NOTE — Assessment & Plan Note (Signed)
Patient remains in sinus rhythm on examination. Continue low-dose beta blocker and xarelto.

## 2015-12-15 NOTE — Assessment & Plan Note (Signed)
Patient is short of breath likely related to recent pneumonia and viral pneumonitis. This is slowly improving with therapy. He is not volume overloaded on examination. Recent BUN and creatinine mildly elevated. Decrease Lasix to 20 mg daily. Check potassium and renal function in 1 week.

## 2015-12-16 ENCOUNTER — Ambulatory Visit (INDEPENDENT_AMBULATORY_CARE_PROVIDER_SITE_OTHER): Payer: Medicare Other | Admitting: Adult Health

## 2015-12-16 ENCOUNTER — Encounter: Payer: Self-pay | Admitting: Adult Health

## 2015-12-16 ENCOUNTER — Ambulatory Visit (HOSPITAL_BASED_OUTPATIENT_CLINIC_OR_DEPARTMENT_OTHER)
Admission: RE | Admit: 2015-12-16 | Discharge: 2015-12-16 | Disposition: A | Discharge status: Home / Self Care | Payer: Medicare Other | Source: Ambulatory Visit | Attending: Adult Health | Admitting: Adult Health

## 2015-12-16 VITALS — BP 98/62 | HR 75 | Ht 68.0 in | Wt 159.0 lb

## 2015-12-16 DIAGNOSIS — J181 Lobar pneumonia, unspecified organism: Secondary | ICD-10-CM

## 2015-12-16 DIAGNOSIS — J9611 Chronic respiratory failure with hypoxia: Secondary | ICD-10-CM

## 2015-12-16 DIAGNOSIS — J9601 Acute respiratory failure with hypoxia: Secondary | ICD-10-CM | POA: Diagnosis not present

## 2015-12-16 DIAGNOSIS — J9610 Chronic respiratory failure, unspecified whether with hypoxia or hypercapnia: Secondary | ICD-10-CM | POA: Insufficient documentation

## 2015-12-16 DIAGNOSIS — B009 Herpesviral infection, unspecified: Secondary | ICD-10-CM

## 2015-12-16 DIAGNOSIS — I25810 Atherosclerosis of coronary artery bypass graft(s) without angina pectoris: Secondary | ICD-10-CM

## 2015-12-16 DIAGNOSIS — R0602 Shortness of breath: Secondary | ICD-10-CM

## 2015-12-16 DIAGNOSIS — J8489 Other specified interstitial pulmonary diseases: Secondary | ICD-10-CM | POA: Diagnosis not present

## 2015-12-16 MED ORDER — VALACYCLOVIR HCL 1 G PO TABS: 2000.0000 mg | ORAL_TABLET | Freq: Two times a day (BID) | ORAL | Status: AC

## 2015-12-16 MED ORDER — PREDNISONE 10 MG PO TABS: 10.0000 mg | ORAL_TABLET | Freq: Every day | ORAL | Status: DC

## 2015-12-16 NOTE — Progress Notes (Signed)
Quick Note:  Called spoke with pt. Reviewed results and recs. Pt voiced understanding and had no further questions. ______ 

## 2015-12-16 NOTE — Patient Instructions (Addendum)
Decrease Prednisone 68m daily  on 12/19/15 , for 2 weeks . Then decrease prednisone 524mdaily .  Return for labs next week as planned , ESR and CBC w/ diff. (BMET from Dr. CrStanford Breed Continue on Oxygen .  Follow up with Dr. WeMelvyn NovasIn 4 weeks and As needed   Chest xray today  Please contact office for sooner follow up if symptoms do not improve or worsen or seek emergency care

## 2015-12-16 NOTE — Assessment & Plan Note (Signed)
Decrease Prednisone 73m daily  on 12/19/15 , for 2 weeks . Then decrease prednisone 5110mdaily .  Return for labs next week as planned , ESR and CBC w/ diff. (BMET from Dr. CrStanford Breed Continue on Oxygen .  Follow up with Dr. WeMelvyn NovasIn 4 weeks and As needed   Chest xray today  Please contact office for sooner follow up if symptoms do not improve or worsen or seek emergency care

## 2015-12-16 NOTE — Assessment & Plan Note (Signed)
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 Then decrease prednisone 536mdaily .  Return for labs next week as planned , ESR and CBC w/ diff. (BMET from Dr. CrStanford Breed Continue on Oxygen .  Follow up with Dr. WeMelvyn NovasIn 4 weeks and As needed   Chest xray today  Please contact office for sooner follow up if symptoms do not improve or worsen or seek emergency care

## 2015-12-16 NOTE — Assessment & Plan Note (Signed)
Cold sores   Plan  Valtrex 1 d  follow up with PCP if not better.

## 2015-12-16 NOTE — Progress Notes (Signed)
Chart and office note reviewed in detail along with available xrays  > agree with a/p as outlined  

## 2015-12-16 NOTE — Addendum Note (Signed)
Addended by: York RamGAY, Bijon Mineer on: 12/16/2015 11:52 AM   Modules accepted: Orders

## 2015-12-16 NOTE — Progress Notes (Signed)
Subjective:    Patient ID: Jose [name], male    DOB: 1930-10-18, 82 y.o.   MRN: [id number]  HPI 82 yo male former smoker -quit smoking 1979 , seen for pulmonary consult 11/2015 for diffuse pulmonary infiltrates with elevated ESR ? BOOP     12/16/2015 [facility] Follow up  Pt returns for a 2 week post hospital follow up. Pt was admited 11/22/15 for acute on chronic diastolic CHF .he required aggressive diuresis . Echo showed EF 55-60%. With DD . CT chest showed diffuse bilateral aspdz with nonspecific mediastinal and bilateral adenopathy. He was seen by Dr. Melvyn Novas  For pulmonary consult for diffuse pulmonary infiltrates w/ high ESR . 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 Marland Kitchenlabs showed elevated RA factor . Neg ANCA, BGM Ab  and ANA  He was started on steroids . Discharged on prednisone taper and oxygen 3l/m at discharge.  Says he is feeling better but remains weak.  CXR done on 4/11 showed decreased interstitial edema.  Labs done by PCP showed elevated wbc ~19k.  Prior to admit , Played golf 3 days a week. And Walked most day .  Complains of Cold sores on lips.  Denies chest pain, orthopnea, edema or fever.      Past Medical History  Diagnosis Date  . Hypertension   . Other malaise and fatigue   . Paroxysmal atrial fibrillation (HCC)   . History of embolic stroke no residual    11/ 2014  --  right PCA and MCA branch infarts (left side vision difficulties)  . Bilateral carotid artery stenosis     mild bilateral proximcal ICA  40% per duplex 11/ 2014  . S/P CABG x 6     2007  . GERD (gastroesophageal reflux disease)   . Ureteral obstruction, right   . At risk for sleep apnea     STOP-BANG= 5   SENT TO PCP 05-20-2014  . ED (erectile dysfunction)   . Coronary artery disease     a. s/p CABG in 2007 w/ LIMA-LAD, SVG-D1, SVG-1st & 2nd Mrg, SVG-PDA and posterior lateral  . Eye infection 12/2014   Current Outpatient Prescriptions on File Prior to Visit    Medication Sig Dispense Refill  . Albuterol Sulfate (PROAIR RESPICLICK) 644 (90 Base) MCG/ACT AEPB Inhale 2 puffs into the lungs every 4 (four) hours as needed (shortness of breath/ wheezing).    . carvedilol (COREG) 3.125 MG tablet Take 1 tablet (3.125 mg total) by mouth 2 (two) times daily with a meal. 60 tablet 12  . COD LIVER OIL PO Take 1 capsule by mouth at bedtime.     Marland Kitchen desonide (DESOWEN) 0.05 % cream Apply 1 application topically every other day. For rosacea  0  . Diphenhyd-Hydrocort-Nystatin (FIRST-DUKES MOUTHWASH) SUSP Use as directed 10 mLs in the mouth or throat 4 (four) times daily as needed (Swich, Gargle and Swallow). 240 mL 0  . furosemide (LASIX) 20 MG tablet Take 1 tablet (20 mg total) by mouth daily. 30 tablet 12  . guaiFENesin (MUCINEX) 600 MG 12 hr tablet Take 1 tablet (600 mg total) by mouth 2 (two) times daily. 10 tablet 0  . guaiFENesin-dextromethorphan (ROBITUSSIN DM) 100-10 MG/5ML syrup Take 5 mLs by mouth every 4 (four) hours as needed for cough. 118 mL 0  . losartan (COZAAR) 100 MG tablet Take 100 mg by mouth daily.     . Multiple Vitamin (MULTIVITAMIN WITH MINERALS) TABS tablet Take 1  tablet by mouth daily.    Marland Kitchen omeprazole (PRILOSEC OTC) 20 MG tablet Take 20 mg by mouth daily.    Vladimir Faster Glycol-Propyl Glycol (SYSTANE ULTRA OP) Place 1 drop into both eyes 2 (two) times daily.    . polyethylene glycol (MIRALAX / GLYCOLAX) packet Take 17 g by mouth daily. Mix in 8 oz liquid and drink    . pravastatin (PRAVACHOL) 80 MG tablet Take 80 mg by mouth at bedtime.     . predniSONE (DELTASONE) 20 MG tablet Take 1 tablet (20 mg total) by mouth daily with breakfast. 40 tablet 0  . saw palmetto 160 MG capsule Take 160 mg by mouth 2 (two) times daily.      . tadalafil (CIALIS) 5 MG tablet Take 5 mg by mouth at bedtime.     . TESTOSTERONE TD Place 1 mL onto the skin daily. Apply every morning to back shoulder - 10% lipoderm cream compounded at [employer]    . XARELTO 15  MG TABS tablet TAKE 1 TABLET BY MOUTH DAILY WITH SUPPER 90 tablet 0   No current facility-administered medications on file prior to visit.   SH  Retired Optometrist  Married.  Former smoker  From [place] .  No unusal hobbies , no extensive travel recently .  No pets.   Review of Systems Constitutional:   No  weight loss, night sweats,  Fevers, chills,  +fatigue, or  lassitude.  HEENT:   No headaches,  Difficulty swallowing,  Tooth/dental problems, or  Sore throat,                No sneezing, itching, ear ache, nasal congestion, post nasal drip,   CV:  No chest pain,  Orthopnea, PND, swelling in lower extremities, anasarca, dizziness, palpitations, syncope.   GI  No heartburn, indigestion, abdominal pain, nausea, vomiting, diarrhea, change in bowel habits, loss of appetite, bloody stools.   Resp: .  No chest wall deformity  Skin: no rash or lesions.  GU: no dysuria, change in color of urine, no urgency or frequency.  No flank pain, no hematuria   MS:  No joint pain or swelling.  No decreased range of motion.  No back pain.  Psych:  No change in mood or affect. No depression or anxiety.  No memory loss.         Objective:   Physical Exam Filed Vitals:   12/16/15 0948  BP: 98/62  Pulse: 75  Height: _0  (1.727 m)  Weight: 159 lb (72.122 kg)  SpO2: 99%    GEN: A/Ox3; pleasant , NAD,elderly   HEENT:  [place]/AT,  EACs-clear, TMs-wnl, NOSE-clear, THROAT-clear, no lesions, no postnasal drip or exudate noted. Noted blisters on lips c/w cold sores   NECK:  Supple w/ fair ROM; no JVD; normal carotid impulses w/o bruits; no thyromegaly or nodules palpated; no lymphadenopathy.  RESP  Clear  P & A; w/o, wheezes/ rales/ or rhonchi.no accessory muscle use, no dullness to percussion  CARD:  RRR, no m/r/g  , no peripheral edema, pulses intact, no cyanosis or clubbing.  GI:   Soft & nt; nml bowel sounds; no organomegaly or masses detected.  Musco: Warm bil, no deformities or joint  swelling noted.   Neuro: alert, no focal deficits noted.    Skin: Warm, no lesions or rashes  Tammy Parrett NP-C  [facility]  12/16/2015        Assessment & Plan:

## 2015-12-20 ENCOUNTER — Telehealth: Payer: Self-pay | Admitting: Adult Health

## 2015-12-20 NOTE — Telephone Encounter (Signed)
Seen 4.20.17 Patient Instructions       Decrease Prednisone 26m daily  on 12/19/15 , for 2 weeks . Then decrease prednisone 572mdaily .   Return for labs next week as planned , ESR and CBC w/ diff. (BMET from Dr. CrStanford Breed  Continue on Oxygen .   Follow up with Dr. WeMelvyn NovasIn 4 weeks and As needed    Chest xray today   Please contact office for sooner follow up if symptoms do not improve or worsen or seek emergency care     Per 4.20.17 cxr results: Result Notes       Notes Recorded by AmOsa CraverCMA on 12/16/2015 at 2:33 PM Called spoke with pt. Reviewed results and recs. Pt voiced understanding and had no further questions. ------  Notes Recorded by TaMelvenia NeedlesNP on 12/16/2015 at 11:48 AM CXR improved with less edema but still has coarse marking /opacities  Would stay on prednisone 2078maily for 2 weeks then go to 41m73mily and hold at this dose until seen in office in 4 weeks  Please contact office for sooner follow up if symptoms do not improve or worsen or seek emergency care     Called spoke with patient who requested some additional clarification on his prednisone dosing.  Pt now understands to staying on the prednisone 20mg35mly x2 weeks, then decrease to 41mg 3ml seen back in the office on 5.18.17 w/ Dr Wert. Melvyn Novashing further needed; will sign off.

## 2015-12-21 ENCOUNTER — Other Ambulatory Visit (INDEPENDENT_AMBULATORY_CARE_PROVIDER_SITE_OTHER): Payer: Medicare Other

## 2015-12-21 DIAGNOSIS — R0602 Shortness of breath: Secondary | ICD-10-CM

## 2015-12-21 DIAGNOSIS — J181 Lobar pneumonia, unspecified organism: Secondary | ICD-10-CM | POA: Diagnosis not present

## 2015-12-21 DIAGNOSIS — J9601 Acute respiratory failure with hypoxia: Secondary | ICD-10-CM

## 2015-12-21 DIAGNOSIS — B009 Herpesviral infection, unspecified: Secondary | ICD-10-CM

## 2015-12-21 DIAGNOSIS — J8489 Other specified interstitial pulmonary diseases: Secondary | ICD-10-CM

## 2015-12-21 DIAGNOSIS — J9611 Chronic respiratory failure with hypoxia: Secondary | ICD-10-CM

## 2015-12-21 LAB — BASIC METABOLIC PANEL
BUN: 23 mg/dL (ref 7–25)
CALCIUM: 9.9 mg/dL (ref 8.6–10.3)
CO2: 28 mmol/L (ref 20–31)
CREATININE: 1.41 mg/dL — AB (ref 0.70–1.11)
Chloride: 97 mmol/L — ABNORMAL LOW (ref 98–110)
Glucose, Bld: 109 mg/dL — ABNORMAL HIGH (ref 65–99)
Potassium: 4.4 mmol/L (ref 3.5–5.3)
Sodium: 133 mmol/L — ABNORMAL LOW (ref 135–146)

## 2015-12-22 ENCOUNTER — Ambulatory Visit: Payer: Medicare Other | Admitting: Physician Assistant

## 2015-12-22 LAB — CBC WITH DIFFERENTIAL/PLATELET
BASOS ABS: 0 {cells}/uL (ref 0–200)
Basophils Relative: 0 %
EOS PCT: 0 %
Eosinophils Absolute: 0 cells/uL — ABNORMAL LOW (ref 15–500)
HEMATOCRIT: 42.7 % (ref 38.5–50.0)
HEMOGLOBIN: 14.3 g/dL (ref 13.2–17.1)
LYMPHS ABS: 808 {cells}/uL — AB (ref 850–3900)
Lymphocytes Relative: 8 %
MCH: 30.2 pg (ref 27.0–33.0)
MCHC: 33.5 g/dL (ref 32.0–36.0)
MCV: 90.1 fL (ref 80.0–100.0)
MONO ABS: 202 {cells}/uL (ref 200–950)
MPV: 10.1 fL (ref 7.5–12.5)
Monocytes Relative: 2 %
NEUTROS PCT: 90 %
Neutro Abs: 9090 cells/uL — ABNORMAL HIGH (ref 1500–7800)
Platelets: 164 10*3/uL (ref 140–400)
RBC: 4.74 MIL/uL (ref 4.20–5.80)
RDW: 14.9 % (ref 11.0–15.0)
WBC: 10.1 10*3/uL (ref 3.8–10.8)

## 2015-12-22 LAB — SEDIMENTATION RATE: Sed Rate: 20 mm/hr (ref 0–20)

## 2015-12-23 NOTE — Progress Notes (Signed)
Quick Note:  LMTCB x1 ______ 

## 2015-12-24 ENCOUNTER — Telehealth: Payer: Self-pay | Admitting: Adult Health

## 2015-12-24 NOTE — Telephone Encounter (Signed)
Patient notified of lab results. See results. Copy of labs sent to Dr. Jens Somrenshaw and Dr. Duaine DredgeBlomgren per patient's request.  Nothing further needed.

## 2015-12-24 NOTE — Progress Notes (Signed)
Quick Note:  Called spoke with pt. He voiced understanding and had no further questions. I sent PCP a copy of lab results. ______

## 2015-12-31 ENCOUNTER — Telehealth: Payer: Self-pay | Admitting: *Deleted

## 2015-12-31 NOTE — Telephone Encounter (Signed)
Spoke with patient who agreed to see Enid Skeens Martin, NP for FU appointment next week. Rescheduled for 11:15 am; he knows to arrive 15 min early.

## 2016-01-03 ENCOUNTER — Encounter (HOSPITAL_COMMUNITY): Payer: Self-pay

## 2016-01-03 NOTE — Progress Notes (Signed)
Called and spoke with patient regarding referral from PCP to pulmonary rehab. Patient states he continues to improve since discharged from hospital. He is exercising on his own and checking his oxygen saturation with exertion. He request to talk with his cardiologist and pulmonologist prior to signing up for orientation. He feels he may no longer need pulmonary rehab. Patient will call back once he has made a decision.

## 2016-01-05 ENCOUNTER — Encounter: Payer: Self-pay | Admitting: Nurse Practitioner

## 2016-01-05 ENCOUNTER — Ambulatory Visit: Payer: Medicare Other | Admitting: Neurology

## 2016-01-05 ENCOUNTER — Ambulatory Visit (INDEPENDENT_AMBULATORY_CARE_PROVIDER_SITE_OTHER): Payer: Medicare Other | Admitting: Nurse Practitioner

## 2016-01-05 VITALS — BP 110/59 | HR 49 | Ht 68.0 in | Wt 157.0 lb

## 2016-01-05 DIAGNOSIS — I480 Paroxysmal atrial fibrillation: Secondary | ICD-10-CM | POA: Diagnosis not present

## 2016-01-05 DIAGNOSIS — Z8673 Personal history of transient ischemic attack (TIA), and cerebral infarction without residual deficits: Secondary | ICD-10-CM | POA: Insufficient documentation

## 2016-01-05 DIAGNOSIS — I10 Essential (primary) hypertension: Secondary | ICD-10-CM | POA: Diagnosis not present

## 2016-01-05 DIAGNOSIS — I25810 Atherosclerosis of coronary artery bypass graft(s) without angina pectoris: Secondary | ICD-10-CM | POA: Diagnosis not present

## 2016-01-05 NOTE — Patient Instructions (Addendum)
Keep systolic blood pressure less than 130, today's reading 110/59 continue blood pressure medications Lipids are followed by PCP  continue Pravachol Carotid Doppler to be followed by Dr. Jens Somrenshaw Continue Xarelto for atrial fibrillation and secondary stroke prevention No further stroke or TIA symptoms since 07/02/2013 If recurrent stroke symptoms occur, call 911 and proceed to the hospital Discharge from neurologic services at this time

## 2016-01-05 NOTE — Progress Notes (Signed)
[facility]  PATIENT: Jose [name] DOB: [birth year]   REASON FOR VISIT: Follow-up for cerebrovascular accident HISTORY FROM: Patient  HISTORY OF PRESENT ILLNESS: Initial Consult 07/09/2013 ;28 year Caucasian male who developed sudden onset of left-sided peripheral vision difficulties on 07/02/13. He noted that he was in black spots on the left side of field of vision and difficulty reading sentences as he could read only parts of the words towards the right and was missing portions on the left. He also developed mild bifrontal headaches which he thought was related to sinuses. The headache was mild and intermittent and did not bother him. Denied any double vision, vertigo, gait, balance difficulties, memory loss focal weakness or numbness. He has no prior history of strokes. He does have vascular factors of hypertension since age 54 as well as coronary artery disease and underwent CABG 7 years ago. He also has hyperlipidemia for which she takes protocol which and lipids were last checked in May and was fine. He had MRI scan of the brain done on 07/07/13 which I personally reviewed and shows a right occipital as well as right frontal MCA branch acute infarcts. MRA of the brain shows no large vessel intracranial stenosis. MRA of the neck showed no extracranial stenosis either. He has a 2-D echocardiogram planned for tomorrow. He was previously on aspirin and Plavix was added by Dr. Lorenz Coaster. He states that his peripheral vision symptoms improved fairly quickly he still has some difficulty while reading and on the extreme left peripheral visual field.  Update 09/17/13 (PS): He returns for followup after last visit 2 months ago. He was found to have paroxysmal atrial fibrillation on life watch monitor with controlled heart rate, CHADs2VASC score was 6 and I prescribed Eliquis but has seen Dr. Johney Frame cardiologist on 09/10/13 who has started him on Xarelto instead do to cost reasons. He  seems to be tolerating it well without significant bleeding and only minor bruising. He had lab work done on 07/10/13 which showed normal lipid profile and hemoglobin A1c was borderline at 6.2. He continues to have mild residual left-sided peripheral vision difficulties but has been able to drive okay and has not had any accidents or near misses. He has noticed for the last several years that he has a little bit of fullness and swelling in his right groin and has not yet sought any medical evaluation for this. He denies significant pain but does notice some fullness in the groin.  UPDATE 01/02/14 (LL): Patient returns for stroke revisit. He states his blood pressure is well controlled, it is 128/70 in our office today. He is tolerating Xarelto well with no signs of significant bleeding or bruising. His peripheral vision has corrected and he has no further problems with his vision except redness and itching from Spring allergies. He continues to play golf 3 x week and feels well. UPDATE 07/06/14 (LL): Jose [name] returns for stroke followup. Last visit was 6 months ago with me. Since last visit, he has not had any recurrent neurological symptoms. Blood pressure is well controlled, it is 131/64 in the office today. He is tolerating Xarelto well with no signs of significant bleeding or bruising. His dose of Xarelto was reduced recently due to lowered kidney function. 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 Differential renal function was found to be approximately 58% left and 42% right. He continues to play golf  as much as weather permits. His last lipid panel in August shows total cholesterol of 118, LDL of 57. He has no complaints today. Update 5/9/2016PS : He returns for follow-up after last visit 6 months ago. Continues to do well from stroke standpoint without recurrent stroke or TIA symptoms. He remains on Xarelto which is  tolerating well without significant bleeding or bruising. He states his blood pressure is quite well controlled and today in fact it is 107/60. He remains on Pravachol 80 mg daily which is tolerating well without myalgias or arthralgias. Last lipid profile checked in August 2015 was fine. He has an annual physical exam coming up on 01/15/15 and will have repeat lipid profile checked at that time. 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 He is being treated by ophthalmologist Dr. Delaney MeigsStonecipher. UPDATE  05/10/2017CM Jose [name],  82 year old male returns for follow-up. He has a history of stroke event that occurred 07/02/2013. He has not had further stroke or TIA symptoms since that time. He remains on Xarelto and tolerating medication without significant bleeding or bruising. His blood pressure is well controlled and is 110/59 the office today. He remains on Pravachol 80 mg daily without myalgias. Lipid profile is followed by his primary care DrVergia Alberts.. Blumgren. His annual physical is coming up. He had an admission to the hospital in March for respiratory failure and chronic acute congestive heart failure. He just completed his physical therapy. Last carotid Doppler was in June 2016. He returns for reevaluation REVIEW OF SYSTEMS: Full 14 system review of systems performed and notable only for those listed, all others are neg:  Constitutional: neg  Cardiovascular: neg Ear/Nose/Throat: neg  Skin: neg Eyes: neg Respiratory:  Shortness of breath Gastroitestinal: neg  Hematology/Lymphatic: neg  Endocrine: neg Musculoskeletal:neg Allergy/Immunology: neg Neurological: neg Psychiatric: neg Sleep : neg   ALLERGIES: Allergies  Allergen Reactions  . Cephalexin Anaphylaxis    Pt says he has tolerated PCN in the past  . Iodinated Diagnostic Agents Anaphylaxis  . Iohexol Anaphylaxis     THROAT SWELLS AND STOPS BREATHING   . Ace Inhibitors Cough  .  Metoprolol Other (See Comments)    Erectile dysfunction  . Viagra [Sildenafil Citrate] Other (See Comments)    Headache and vision changes  . Ciprofloxacin Hives, Itching and Rash  . Codeine Nausea And Vomiting and Other (See Comments)    headaches  . Sulfa Antibiotics Rash    HOME MEDICATIONS: Outpatient Prescriptions Prior to Visit  Medication Sig Dispense Refill  . carvedilol (COREG) 3.125 MG tablet Take 1 tablet (3.125 mg total) by mouth 2 (two) times daily with a meal. 60 tablet 12  . COD LIVER OIL PO Take 1 capsule by mouth at bedtime.     Marland Kitchen. desonide (DESOWEN) 0.05 % cream Apply 1 application topically every other day. For rosacea  0  . furosemide (LASIX) 20 MG tablet Take 1 tablet (20 mg total) by mouth daily. 30 tablet 12  . losartan (COZAAR) 100 MG tablet Take 100 mg by mouth daily.     . Multiple Vitamin (MULTIVITAMIN WITH MINERALS) TABS tablet Take 1 tablet by mouth daily.    Marland Kitchen. omeprazole (PRILOSEC OTC) 20 MG tablet Take 20 mg by mouth daily.    Bertram Gala. Polyethyl Glycol-Propyl Glycol (SYSTANE ULTRA OP) Place 1 drop into both eyes 2 (two) times daily.    . polyethylene glycol (MIRALAX / GLYCOLAX) packet Take 17 g by mouth  daily. Mix in 8 oz liquid and drink    . pravastatin (PRAVACHOL) 80 MG tablet Take 80 mg by mouth at bedtime.     . predniSONE (DELTASONE) 10 MG tablet Take 1 tablet (10 mg total) by mouth daily with breakfast. And as directed 30 tablet 0  . predniSONE (DELTASONE) 20 MG tablet Take 1 tablet (20 mg total) by mouth daily with breakfast. 40 tablet 0  . saw palmetto 160 MG capsule Take 160 mg by mouth 2 (two) times daily.      . tadalafil (CIALIS) 5 MG tablet Take 5 mg by mouth at bedtime.     . TESTOSTERONE TD Place 1 mL onto the skin daily. Apply every morning to back shoulder - 10% lipoderm cream compounded at [employer]    . XARELTO 15 MG TABS tablet TAKE 1 TABLET BY MOUTH DAILY WITH SUPPER 90 tablet 0  . Albuterol Sulfate (PROAIR RESPICLICK) 108 (90 Base)  MCG/ACT AEPB Inhale 2 puffs into the lungs every 4 (four) hours as needed (shortness of breath/ wheezing).    . Diphenhyd-Hydrocort-Nystatin (FIRST-DUKES MOUTHWASH) SUSP Use as directed 10 mLs in the mouth or throat 4 (four) times daily as needed (Swich, Gargle and Swallow). 240 mL 0  . guaiFENesin (MUCINEX) 600 MG 12 hr tablet Take 1 tablet (600 mg total) by mouth 2 (two) times daily. 10 tablet 0  . guaiFENesin-dextromethorphan (ROBITUSSIN DM) 100-10 MG/5ML syrup Take 5 mLs by mouth every 4 (four) hours as needed for cough. 118 mL 0   No facility-administered medications prior to visit.    PAST MEDICAL HISTORY: Past Medical History  Diagnosis Date  . Hypertension   . Other malaise and fatigue   . Paroxysmal atrial fibrillation (HCC)   . History of embolic stroke no residual    11/ 2014  --  right PCA and MCA branch infarts (left side vision difficulties)  . Bilateral carotid artery stenosis     mild bilateral proximcal ICA  40% per duplex 11/ 2014  . S/P CABG x 6     2007  . GERD (gastroesophageal reflux disease)   . Ureteral obstruction, right   . At risk for sleep apnea     STOP-BANG= 5   SENT TO PCP 05-20-2014  . ED (erectile dysfunction)   . Coronary artery disease     a. s/p CABG in 2007 w/ LIMA-LAD, SVG-D1, SVG-1st & 2nd Mrg, SVG-PDA and posterior lateral  . Eye infection 12/2014    PAST SURGICAL HISTORY: Past Surgical History  Procedure Laterality Date  . Tee without cardioversion N/A 07/17/2013    Procedure: TRANSESOPHAGEAL ECHOCARDIOGRAM (TEE);  Surgeon: Wendall Stade, MD;  Location: [facility] ENDOSCOPY;  Service: Cardiovascular;  Laterality: N/A;  normal LV size, mild prolapse of posterior mitral valve leaflet,  mild MR and TR, normal AV,  no LAA thrombus,  atrial septal aneurysm with no PFO/ ASD and negative bubble study, normal RV, normal aorta with no debris  . Laparoscopic cholecystectomy  10-02-2000  . Coronary artery bypass graft  11-01-2005  dr Cornelius Moras    CLOSURE OF PFO/   LIMA to LAD,  SVG to D1,  SVG to 1st & 2nd  MARGINAL branches, SVG  to PDA and posterior lateral  . Inguinal hernia repair Bilateral   . Cardiac catheterization  10-31-2005  dr wall    severe three vessel/  perserved LV  . Cardiovascular stress test  last one 05-19-2014  dr Jens Som    low risk lexiscan no  exercise study/ small inferobasal infarct with no ischemia /  ef 53%  . Appendectomy  1950's  . Right ureter obstruction surgery  age 72  . Cystoscopy w/ ureteral stent placement Right 05/21/2014    Procedure: CYSTOSCOPY WITH RETROGRADE PYELOGRAM/URETERAL STENT PLACEMENT;  Surgeon: Kathi Ludwig, MD;  Location: [facility];  Service: Urology;  Laterality: Right;  . Cystoscopy with ureteroscopy Right 05/21/2014    Procedure: CYSTOSCOPY WITH URETEROSCOPY;  Surgeon: Kathi Ludwig, MD;  Location: [facility];  Service: Urology;  Laterality: Right;    FAMILY HISTORY: Family History  Problem Relation Age of Onset  . Heart disease Mother     PPM  . Hypertension Mother   . Lymphoma Father   . Prostate cancer Father   . Prostate cancer Brother   . Aneurysm Brother     REPAIR  . CAD Brother     CABG  . Other Brother     STENTS IN LEGS  . Other Sister     STOMACH ISSUES  . Depression Sister   . Lung cancer Brother     Asbestosis    SOCIAL HISTORY: Social History   Social History  . Marital Status: Married    Spouse Name: maxine  . Number of Children: 4  . Years of Education: college4   Occupational History  . retired    Social History Main Topics  . Smoking status: Former Smoker -- 1.00 packs/day for 35 years    Types: Cigarettes    Quit date: 08/29/1977  . Smokeless tobacco: Never Used  . Alcohol Use: No  . Drug Use: No  . Sexual Activity: Not on file   Other Topics Concern  . Not on file   Social History Narrative   Patient is married with 4 children.   Patient is right handed.   Patient has college education.    Patient drinks decaff coffee.     PHYSICAL EXAM  Filed Vitals:   01/05/16 1112  Weight: 157 lb (71.215 kg)   Body mass index is 23.88 kg/(m^2). General: well developed, well nourished, seated, in no evident distress .  Head: head normocephalic and atraumatic. Orohparynx benign  Neck: supple with no carotid  bruits  Cardiovascular: regular rate and rhythm, no murmurs  Musculoskeletal: no deformity  Skin: no rash/petichiae  Vascular: Normal pulses all extremities  Neurological examination   Mentation:Awake and fully alert. Oriented to place and time. Recent and remote memory intact. Attention span, concentration and fund of knowledge appropriate. Mood and affect appropriate.  Cranial Nerves: Pupils equal, briskly reactive to light. Extraocular movements full without nystagmus. Visual fields full to confrontation with only mild extreme left partial peripheral temporal field defect to bedside confrontational testing. Hearing intact. Facial sensation intact. Face, tongue, palate moves normally and symmetrically.  Motor: Normal bulk and tone. Normal strength in all tested extremity muscles.  Sensory: intact to touch and pinprick and vibratory.  Coordination: Rapid alternating movements normal in all extremities. Finger-to-nose and heel-to-shin performed accurately bilaterally.  Gait and Station: Arises from chair without difficulty. Stance is normal. Gait demonstrates normal stride length and balance . Able to heel, toe and tandem walk without difficulty.  Reflexes: 1+ and symmetric.  DIAGNOSTIC DATA (LABS, IMAGING, TESTING) - I reviewed patient records, labs, notes, testing and imaging myself where available.  Lab Results  Component Value Date   WBC 10.1 12/21/2015   HGB 14.3 12/21/2015   HCT 42.7 12/21/2015   MCV 90.1 12/21/2015  PLT 164 12/21/2015       Lab Results  Component Value Date   TSH 0.642 11/22/2015      ASSESSMENT AND PLAN 82 y.o. year old male  has a past medical history of Coronary artery disease; Chest pain; Hypertension; Other malaise and fatigue; Stroke;07/02/2013 and Paroxysmal atrial fibrillation right PCA and MCA branch infarcts in November 2014, now on Xarelto. Doing well.   Keep systolic blood pressure less than 130, today's reading 110/59 continue blood pressure medications Lipids are followed by PCP  continue Pravachol Carotid Doppler to be followed by Dr. Jens Som Continue Xarelto for atrial fibrillation and secondary stroke prevention No further stroke or TIA symptoms since 07/02/2013 If recurrent stroke symptoms occur, call 911 and proceed to the hospital Discharge from neurologic services at this time Nilda Riggs, [facility], [facility], APRN  [facility] [address] [place], [place] [address] [phone]

## 2016-01-06 NOTE — Progress Notes (Signed)
I agree with the above plan 

## 2016-01-13 ENCOUNTER — Other Ambulatory Visit: Payer: Self-pay | Admitting: Cardiology

## 2016-01-13 ENCOUNTER — Other Ambulatory Visit (INDEPENDENT_AMBULATORY_CARE_PROVIDER_SITE_OTHER): Payer: Medicare Other

## 2016-01-13 ENCOUNTER — Ambulatory Visit (INDEPENDENT_AMBULATORY_CARE_PROVIDER_SITE_OTHER): Payer: Medicare Other | Admitting: Internal Medicine

## 2016-01-13 ENCOUNTER — Encounter: Payer: Self-pay | Admitting: Internal Medicine

## 2016-01-13 VITALS — BP 110/60 | HR 56 | Ht 68.0 in | Wt 163.8 lb

## 2016-01-13 DIAGNOSIS — I25810 Atherosclerosis of coronary artery bypass graft(s) without angina pectoris: Secondary | ICD-10-CM

## 2016-01-13 DIAGNOSIS — J9601 Acute respiratory failure with hypoxia: Secondary | ICD-10-CM

## 2016-01-13 DIAGNOSIS — J8489 Other specified interstitial pulmonary diseases: Secondary | ICD-10-CM

## 2016-01-13 LAB — SEDIMENTATION RATE: SED RATE: 12 mm/h (ref 0–22)

## 2016-01-13 NOTE — Patient Instructions (Addendum)
Ok to wean to 2lpm at bedtime  Please remember to go to the lab  department downstairs for your tests - we will call you with the results when they are available.  Please schedule a follow up office visit in 4 weeks, sooner if needed to see Tammy NP with cxr  Then  Add try off noct 02 and check RA ONO next ov if continuing to do so well

## 2016-01-13 NOTE — Progress Notes (Signed)
Subjective:    Patient ID: Jose [name], male    DOB: 08/14/1931, 82 y.o.   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   History of Present Illness  12/16/2015 [name] Follow up / Prednisone 20  Pt returns for a 2 week post hospital follow up. Pt was admited 11/22/15 for acute on chronic diastolic CHF .he required aggressive diuresis . Echo showed EF 55-60%. With DD . CT chest showed diffuse bilateral aspdz with nonspecific mediastinal and bilateral adenopathy. He was seen by Dr. Melvyn Novas  For pulmonary consult for diffuse pulmonary infiltrates w/ high ESR . 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 Marland Kitchenlabs showed elevated RA factor . Neg ANCA, BGM Ab  and ANA  He was started on steroids . Discharged on prednisone taper and oxygen 3l/m at discharge.  Says he is feeling better but remains weak.  CXR done on 4/11 showed decreased interstitial edema.  Labs done by PCP showed elevated wbc ~19k.  Prior to admit , Played golf 3 days a week. And Walked most day .  rec Decrease Prednisone 39m daily   And leave there  Return for labs next week as planned , ESR and CBC w/ diff. (BMET from Dr. 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 Follow-up    Breathing is doing well. He has had min cough with yellow sputum in the am only. He has been exercising without any trouble.   monitoring his own sats and never under 90 % with any activity he wants to do  No obvious day to day or daytime variability or assoc  cp or chest tightness, subjective wheeze or overt sinus or hb symptoms. No unusual exp hx or h/o childhood pna/ asthma or knowledge of premature birth.  Sleeping ok without nocturnal  or early am exacerbation  of respiratory   c/o's or need for noct saba. Also denies any obvious fluctuation of symptoms with weather or environmental changes or other aggravating or alleviating factors except as outlined above   Current Medications, Allergies, Complete Past Medical History, Past Surgical History, Family History, and Social History were reviewed in [employer].  ROS  The following are not active complaints unless bolded sore throat, dysphagia, dental problems, itching, sneezing,  nasal congestion or excess/ purulent secretions, ear ache,   fever, chills, sweats, unintended wt loss, classically pleuritic or exertional cp, hemoptysis,  orthopnea pnd or leg swelling, presyncope, palpitations, abdominal pain, anorexia, nausea, vomiting, diarrhea  or change in bowel or bladder habits, change in stools or urine, dysuria,hematuria,  rash, arthralgias, visual complaints, headache, numbness, weakness or ataxia or problems with walking or coordination,  change in mood/affect or memory.               Objective:   Physical Exam    amb pleasant wm nad  Wt Readings from Last 3 Encounters:  01/13/16 163 lb 12.8 oz (74.299 kg)  01/05/16 157 lb (71.215 kg)  12/16/15 159 lb (72.122 kg)    Vital signs reviewed    GEN: A/Ox3; pleasant , NAD,elderly   HEENT:  [place]/AT,  EACs-clear, TMs-wnl, NOSE-clear, THROAT-clear, no lesions, no postnasal drip or exudate noted.  Noted blisters on lips c/w cold sores   NECK:  Supple w/ fair ROM; no JVD; normal carotid impulses w/o bruits; no thyromegaly or nodules palpated; no lymphadenopathy.  RESP  Very minimal insp crackles R > L base/ no accessory muscle use, no dullness to percussion  CARD:  RRR, no m/r/g  , no peripheral edema, pulses intact, no cyanosis or clubbing.  GI:   Soft & nt; nml bowel sounds; no organomegaly or masses detected.  Musco: Warm bil, no deformities or joint swelling noted.   Neuro: alert, no focal deficits noted.    Skin: Warm, no  lesions or rashes        Lab Results  Component Value Date   ESRSEDRATE 12 01/13/2016   ESRSEDRATE 20 12/21/2015   ESRSEDRATE 94* 11/26/2015      I personally reviewed images and agree with radiology impression as follows:  CXR:  12/16/15 Stable since 12/07/2015. Interstitial opacities it is likely incomplete clearing of the patient's recent pneumonia. No new abnormality.      Assessment & Plan:

## 2016-01-13 NOTE — Assessment & Plan Note (Signed)
Def responding to pred clinically with ex tol back to nl and esr also down dramatically  The goal with a chronic steroid dependent illness is always arriving at the lowest effective dose that controls the disease/symptoms and not accepting a set "formula" which is based on statistics or guidelines that don't always take into account patient  variability or the natural hx of the dz in every individual patient, which may well vary over time.  For now therefore I recommend the patient maintain  10/5 x 2 weeks then 5 mg daily then f/u to taper off at 4 weeks with repeat cxr then.  I had an extended discussion with the patient reviewing all relevant studies completed to date and  lasting 15 to 20 minutes of a 25 minute visit    Each maintenance medication was reviewed in detail including most importantly the difference between maintenance and prns and under what circumstances the prns are to be triggered using an action plan format that is not reflected in the computer generated alphabetically organized AVS.    Please see instructions for details which were reviewed in writing and the patient given a copy highlighting the part that I personally wrote and discussed at today's ov.

## 2016-01-13 NOTE — Assessment & Plan Note (Addendum)
Improved with steroid rx and only using noct 02 now so rec taper to 2lpm hs, consider d/c and repeat ono RA next ov if continues to do so well as steroids tapered completely off.

## 2016-01-13 NOTE — Telephone Encounter (Signed)
Rx request sent to pharmacy.  

## 2016-01-14 ENCOUNTER — Telehealth: Payer: Self-pay | Admitting: Internal Medicine

## 2016-01-14 NOTE — Telephone Encounter (Signed)
Result Note     Call patient : Study is improved on 10 mg pred so change to 10 a/w 5 x 2 week then 5 mg daily x 2 weeks but increase back to prev dose if worse sob or sats walking   Spoke with pt and notified of results per Dr. Sherene SiresWert. Pt verbalized understanding and denied any questions.

## 2016-01-14 NOTE — Progress Notes (Signed)
Quick Note:  Spoke with pt and notified of results per Dr. Wert. Pt verbalized understanding and denied any questions.  ______ 

## 2016-01-14 NOTE — Progress Notes (Signed)
Quick Note:  lmtcb ______ 

## 2016-01-17 ENCOUNTER — Telehealth: Payer: Self-pay | Admitting: Cardiology

## 2016-01-17 MED ORDER — RIVAROXABAN 15 MG PO TABS: 15.0000 mg | ORAL_TABLET | Freq: Two times a day (BID) | ORAL | Status: DC

## 2016-01-17 NOTE — Telephone Encounter (Signed)
Follow up      Pharmacy says they never received presc for xarelto 15mg .  Please call it in to [facility]

## 2016-01-17 NOTE — Telephone Encounter (Signed)
rx sent

## 2016-01-17 NOTE — Telephone Encounter (Signed)
New message       Pt c/o medication issue:  1. Name of Medication: Xarelto  2. How are you currently taking this medication (dosage and times per day)? Pharmacy tech uncertain of strength  3. Are you having a reaction (difficulty breathing--STAT)? No  4. What is your medication issue? The pharmacy tech please, to re-call in the prescription for the Xarelto the pharmacy is saying they never got a prescription.

## 2016-01-18 ENCOUNTER — Telehealth (HOSPITAL_COMMUNITY): Payer: Self-pay

## 2016-01-18 NOTE — Telephone Encounter (Signed)
Called patient in regards to Pulmonary Rehab referral. Patient states that he is already exercising at home and doing pretty well. He is declining rehab at this time. Encouraged to call us in the future if he changes his mind.

## 2016-01-19 ENCOUNTER — Ambulatory Visit (INDEPENDENT_AMBULATORY_CARE_PROVIDER_SITE_OTHER): Payer: Medicare Other | Admitting: Cardiology

## 2016-01-19 ENCOUNTER — Encounter: Payer: Self-pay | Admitting: Cardiology

## 2016-01-19 VITALS — BP 117/71 | HR 54 | Ht 68.0 in | Wt 166.0 lb

## 2016-01-19 DIAGNOSIS — I5033 Acute on chronic diastolic (congestive) heart failure: Secondary | ICD-10-CM

## 2016-01-19 DIAGNOSIS — I2510 Atherosclerotic heart disease of native coronary artery without angina pectoris: Secondary | ICD-10-CM

## 2016-01-19 DIAGNOSIS — I480 Paroxysmal atrial fibrillation: Secondary | ICD-10-CM

## 2016-01-19 NOTE — Assessment & Plan Note (Signed)
Patient is euvolemic on examination.Continue present dose of Lasix. 

## 2016-01-19 NOTE — Assessment & Plan Note (Signed)
Blood pressure controlled. Continue present medications. 

## 2016-01-19 NOTE — Assessment & Plan Note (Signed)
Continue statin.No aspirin given need for anticoagulation. 

## 2016-01-19 NOTE — Patient Instructions (Signed)
Medication Instructions:   NO CHANGE  Testing/Procedures:  Your physician has requested that you have a lexiscan myoview. For further information please visit [email or web address]. Please follow instruction sheet, as given.    Follow-Up:  Your physician wants you to follow-up in: 6 MONTHS WITH DR CRENSHAW You will receive a reminder letter in the mail two months in advance. If you don't receive a letter, please call our office to schedule the follow-up appointment.   If you need a refill on your cardiac medications before your next appointment, please call your pharmacy.    

## 2016-01-19 NOTE — Assessment & Plan Note (Signed)
Continue statin. 

## 2016-01-19 NOTE — Assessment & Plan Note (Signed)
Patient had mild elevation in troponin during recent hospitalization. Plan Lexiscan nuclear study for risk stratification.

## 2016-01-19 NOTE — Progress Notes (Signed)
HPI: FU CAD and atrial fibrillation. Patient underwent coronary artery bypass and graft in 2007. He had a LIMA to the LAD, saphenous vein graft to first diagonal, sequential saphenous vein graft to the first marginal and second marginal, sequential saphenous vein graft to the PDA and posterior lateral. He also had closure of PFO. Abdominal ultrasound May 2014 showed no aneurysm. Had CVA in November of 2014. Transesophageal echocardiogram November 2014 showed normal LV function, mild prolapse of the posterior mitral valve leaflet and an atrial septal aneurysm with negative bubble study. Carotid Dopplers November 2014 showed less than 40% bilateral stenosis. 30 day event monitor showed atrial fibrillation. Nuclear study in September 2015 showed an ejection fraction of 53%. Inferior basal scar but no ischemia. Echocardiogram March 2017 showed normal LV systolic function, grade 1 diastolic dysfunction, moderate left atrial enlargement and mild tricuspid regurgitation. Patient admitted with acute on chronic congestive heart failure in April 2017. Patient was diuresed but continued to be short of breath. 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 Troponin was elevated at 1.34 and was treated medically.Since last seen, He has some dyspnea on exertion but improved. No orthopnea, PND, pedal edema, chest pain or syncope. No bleeding.  Current Outpatient Prescriptions  Medication Sig Dispense Refill  . carvedilol (COREG) 3.125 MG tablet Take 1 tablet (3.125 mg total) by mouth 2 (two) times daily with a meal. 60 tablet 12  . COD LIVER OIL PO Take 1 capsule by mouth at bedtime.     Marland Kitchen desonide (DESOWEN) 0.05 % cream Apply 1 application topically every other day. For rosacea  0  . furosemide (LASIX) 20 MG tablet Take 1 tablet (20 mg total) by mouth daily. 30 tablet 12  . losartan (COZAAR) 100 MG tablet Take 100 mg by mouth daily.     . Multiple Vitamin  (MULTIVITAMIN WITH MINERALS) TABS tablet Take 1 tablet by mouth daily.    Marland Kitchen omeprazole (PRILOSEC OTC) 20 MG tablet Take 20 mg by mouth daily.    . OXYGEN 3lp with sleep only  [facility]    . Polyethyl Glycol-Propyl Glycol (SYSTANE ULTRA OP) Place 1 drop into both eyes 2 (two) times daily.    . polyethylene glycol (MIRALAX / GLYCOLAX) packet Take 17 g by mouth daily. Mix in 8 oz liquid and drink    . pravastatin (PRAVACHOL) 80 MG tablet Take 80 mg by mouth at bedtime.     . predniSONE (DELTASONE) 10 MG tablet Take 1 tablet (10 mg total) by mouth daily with breakfast. And as directed 30 tablet 0  . Rivaroxaban (XARELTO) 15 MG TABS tablet Take 1 tablet (15 mg total) by mouth 2 (two) times daily with a meal. Please keep appointment. (Patient taking differently: Take 15 mg by mouth daily with supper. Please keep appointment.) 90 tablet 1  . saw palmetto 160 MG capsule Take 160 mg by mouth 2 (two) times daily.      . tadalafil (CIALIS) 5 MG tablet Take 5 mg by mouth at bedtime.     . TESTOSTERONE TD Place 1 mL onto the skin daily. Apply every morning to back shoulder - 10% lipoderm cream compounded at [facility]     No current facility-administered medications for this visit.     Past Medical History  Diagnosis Date  . Hypertension   . Other malaise and fatigue   . Paroxysmal atrial fibrillation (HCC)   . History of embolic stroke no  residual    11/ 2014  --  right PCA and MCA branch infarts (left side vision difficulties)  . Bilateral carotid artery stenosis     mild bilateral proximcal ICA  40% per duplex 11/ 2014  . S/P CABG x 6     2007  . GERD (gastroesophageal reflux disease)   . Ureteral obstruction, right   . At risk for sleep apnea     STOP-BANG= 5   SENT TO PCP 05-20-2014  . ED (erectile dysfunction)   . Coronary artery disease     a. s/p CABG in 2007 w/ LIMA-LAD, SVG-D1, SVG-1st & 2nd Mrg, SVG-PDA and posterior lateral  . Eye infection 12/2014  . Pneumonia 10/2015    led to  CHF, in hospital x 6 days    Past Surgical History  Procedure Laterality Date  . Tee without cardioversion N/A 07/17/2013    Procedure: TRANSESOPHAGEAL ECHOCARDIOGRAM (TEE);  Surgeon: Wendall Stade, MD;  Location: [facility] ENDOSCOPY;  Service: Cardiovascular;  Laterality: N/A;  normal LV size, mild prolapse of posterior mitral valve leaflet,  mild MR and TR, normal AV,  no LAA thrombus,  atrial septal aneurysm with no PFO/ ASD and negative bubble study, normal RV, normal aorta with no debris  . Laparoscopic cholecystectomy  10-02-2000  . Coronary artery bypass graft  11-01-2005  dr Cornelius Moras    CLOSURE OF PFO/  LIMA to LAD,  SVG to D1,  SVG to 1st & 2nd  MARGINAL branches, SVG  to PDA and posterior lateral  . Inguinal hernia repair Bilateral   . Cardiac catheterization  10-31-2005  dr wall    severe three vessel/  perserved LV  . Cardiovascular stress test  last one 05-19-2014  dr Jens Som    low risk lexiscan no exercise study/ small inferobasal infarct with no ischemia /  ef 53%  . Appendectomy  1950's  . Right ureter obstruction surgery  age 18  . Cystoscopy w/ ureteral stent placement Right 05/21/2014    Procedure: CYSTOSCOPY WITH RETROGRADE PYELOGRAM/URETERAL STENT PLACEMENT;  Surgeon: Kathi Ludwig, MD;  Location: [facility];  Service: Urology;  Laterality: Right;  . Cystoscopy with ureteroscopy Right 05/21/2014    Procedure: CYSTOSCOPY WITH URETEROSCOPY;  Surgeon: Kathi Ludwig, MD;  Location: [facility];  Service: Urology;  Laterality: Right;    Social History   Social History  . Marital Status: Married    Spouse Name: maxine  . Number of Children: 4  . Years of Education: college4   Occupational History  . retired    Social History Main Topics  . Smoking status: Former Smoker -- 1.00 packs/day for 35 years    Types: Cigarettes    Quit date: 08/29/1977  . Smokeless tobacco: Never Used  . Alcohol Use: No  . Drug Use: No  . Sexual  Activity: Not on file   Other Topics Concern  . Not on file   Social History Narrative   Patient is married with 4 children.   Patient is right handed.   Patient has college education.   Patient drinks decaff coffee.    Family History  Problem Relation Age of Onset  . Heart disease Mother     PPM  . Hypertension Mother   . Lymphoma Father   . Prostate cancer Father   . Prostate cancer Brother   . Aneurysm Brother     REPAIR  . CAD Brother     CABG  . Other Brother  STENTS IN LEGS  . Other Sister     STOMACH ISSUES  . Depression Sister   . Lung cancer Brother     Asbestosis    ROS: no fevers or chills, productive cough, hemoptysis, dysphasia, odynophagia, melena, hematochezia, dysuria, hematuria, rash, seizure activity, orthopnea, PND, pedal edema, claudication. Remaining systems are negative.  Physical Exam: Well-developed well-nourished in no acute distress.  Skin is warm and dry.  HEENT is normal.  Neck is supple.  Chest Diminished breath sounds throughout Cardiovascular exam is regular rate and rhythm.  Abdominal exam nontender or distended. No masses palpated. Extremities show no edema. neuro grossly intact

## 2016-01-19 NOTE — Assessment & Plan Note (Signed)
Patient remains in sinus rhythm on examination. Continue beta blocker and xarelto.

## 2016-01-21 ENCOUNTER — Telehealth (HOSPITAL_COMMUNITY): Payer: Self-pay

## 2016-01-21 NOTE — Telephone Encounter (Signed)
Encounter complete. 

## 2016-01-26 ENCOUNTER — Ambulatory Visit (HOSPITAL_COMMUNITY)
Admission: RE | Admit: 2016-01-26 | Discharge: 2016-01-26 | Disposition: A | Discharge status: Home / Self Care | Payer: Medicare Other | Source: Ambulatory Visit | Attending: Cardiology | Admitting: Cardiology

## 2016-01-26 DIAGNOSIS — R0609 Other forms of dyspnea: Secondary | ICD-10-CM | POA: Insufficient documentation

## 2016-01-26 DIAGNOSIS — I10 Essential (primary) hypertension: Secondary | ICD-10-CM | POA: Insufficient documentation

## 2016-01-26 DIAGNOSIS — R0602 Shortness of breath: Secondary | ICD-10-CM | POA: Insufficient documentation

## 2016-01-26 DIAGNOSIS — I2510 Atherosclerotic heart disease of native coronary artery without angina pectoris: Secondary | ICD-10-CM

## 2016-01-26 DIAGNOSIS — Z87891 Personal history of nicotine dependence: Secondary | ICD-10-CM | POA: Diagnosis not present

## 2016-01-26 DIAGNOSIS — R9439 Abnormal result of other cardiovascular function study: Secondary | ICD-10-CM | POA: Diagnosis not present

## 2016-01-26 DIAGNOSIS — I779 Disorder of arteries and arterioles, unspecified: Secondary | ICD-10-CM | POA: Diagnosis not present

## 2016-01-26 DIAGNOSIS — Z8249 Family history of ischemic heart disease and other diseases of the circulatory system: Secondary | ICD-10-CM | POA: Insufficient documentation

## 2016-01-26 LAB — MYOCARDIAL PERFUSION IMAGING
CHL CUP NUCLEAR SDS: 1
CHL CUP NUCLEAR SRS: 7
CHL CUP RESTING HR STRESS: 52 {beats}/min
LV sys vol: 64 mL
LVDIAVOL: 135 mL (ref 62–150)
Peak HR: 65 {beats}/min
SSS: 8
TID: 0.99

## 2016-01-26 MED ORDER — TECHNETIUM TC 99M TETROFOSMIN IV KIT
10.0000 | PACK | Freq: Once | INTRAVENOUS | Status: AC | PRN
  Administered 2016-01-26: 10 via INTRAVENOUS
  Filled 2016-01-26: qty 10

## 2016-01-26 MED ORDER — TECHNETIUM TC 99M TETROFOSMIN IV KIT
29.1000 | PACK | Freq: Once | INTRAVENOUS | Status: AC | PRN
  Administered 2016-01-26: 29.1 via INTRAVENOUS
  Filled 2016-01-26: qty 29

## 2016-01-26 MED ORDER — REGADENOSON 0.4 MG/5ML IV SOLN
0.4000 mg | Freq: Once | INTRAVENOUS | Status: AC
  Administered 2016-01-26: 0.4 mg via INTRAVENOUS

## 2016-02-03 ENCOUNTER — Encounter: Payer: Self-pay | Admitting: Cardiology

## 2016-02-10 ENCOUNTER — Telehealth: Payer: Self-pay | Admitting: Adult Health

## 2016-02-10 ENCOUNTER — Ambulatory Visit (HOSPITAL_BASED_OUTPATIENT_CLINIC_OR_DEPARTMENT_OTHER)
Admission: RE | Admit: 2016-02-10 | Discharge: 2016-02-10 | Disposition: A | Discharge status: Home / Self Care | Payer: Medicare Other | Source: Ambulatory Visit | Attending: Adult Health | Admitting: Adult Health

## 2016-02-10 ENCOUNTER — Ambulatory Visit (INDEPENDENT_AMBULATORY_CARE_PROVIDER_SITE_OTHER): Payer: Medicare Other | Admitting: Adult Health

## 2016-02-10 ENCOUNTER — Encounter: Payer: Self-pay | Admitting: Adult Health

## 2016-02-10 VITALS — BP 141/74 | HR 53 | Temp 97.4°F | Ht 68.0 in | Wt 168.0 lb

## 2016-02-10 DIAGNOSIS — J9611 Chronic respiratory failure with hypoxia: Secondary | ICD-10-CM

## 2016-02-10 DIAGNOSIS — J8489 Other specified interstitial pulmonary diseases: Secondary | ICD-10-CM

## 2016-02-10 DIAGNOSIS — J984 Other disorders of lung: Secondary | ICD-10-CM | POA: Diagnosis not present

## 2016-02-10 NOTE — Progress Notes (Signed)
Quick Note:  LVM for pt to return call ______ 

## 2016-02-10 NOTE — Assessment & Plan Note (Signed)
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  Chest xray today .  Set up for ONO on room air.  Follow up Dr. Wert  In 3 months and As needed     

## 2016-02-10 NOTE — Patient Instructions (Signed)
Decrease Prednisone 5mg  every other day until gone , then stop .  Chest xray today .  Set up for ONO on room air.  Follow up Dr. Sherene SiresWert  In 3 months and As needed

## 2016-02-10 NOTE — Progress Notes (Signed)
 Subjective:    Patient ID: Jose [name], male    DOB: 10/30/1930, 82 y.o.   MRN: [id number]  Brief patient profile:  84 yowm -quit smoking 1979 , seen for pulmonary consult 11/27/2015 for diffuse pulmonary infiltrates with elevated ESR ? 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   History of Present Illness  12/16/2015 [facility] Follow up / Prednisone 20  Pt returns for a 2 week post hospital follow up. Pt was admited 11/22/15 for acute on chronic diastolic CHF .he required aggressive diuresis . Echo showed EF 55-60%. With DD . CT chest showed diffuse bilateral aspdz with nonspecific mediastinal and bilateral adenopathy. He was seen by Dr. Wert  For pulmonary consult for diffuse pulmonary infiltrates w/ high ESR . 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 .labs showed elevated RA factor . Neg ANCA, BGM Ab  and ANA  He was started on steroids . Discharged on prednisone taper and oxygen 3l/m at discharge.  Says he is feeling better but remains weak.  CXR done on 4/11 showed decreased interstitial edema.  Labs done by PCP showed elevated wbc ~19k.  Prior to admit , Played golf 3 days a week. And Walked most day .  rec Decrease Prednisone 10mg daily   And leave there  Return for labs next week as planned , ESR and CBC w/ diff. (BMET from Dr. 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 Follow-up    Breathing is doing well. He has had min cough with yellow sputum in the am only. He has been exercising without any trouble.   monitoring his own sats and never under 90 % with any activity he wants to do  decrease prednisone slow taper  02/10/2016 Follow up : Prob BOOP  Pt returns for 1 month follow up  He is feeling much better. Back to normal activities with golf and walking .  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 Steroid responsive.  He has been on slow taper  over last 2 months.  ESR went from 94 to 12.  Says breathing is doing well. No cough.  Denies chest pain, orthopnea, edema or fever.   Wife just dx with breast cancer. Support provided.  Wants to go to [place], wait until ONO done.    Current Medications, Allergies, Complete Past Medical History, Past Surgical History, Family History, and Social History were reviewed in [facility] electronic medical record.  ROS  The following are not active complaints unless bolded sore throat, dysphagia, dental problems, itching, sneezing,  nasal congestion or excess/ purulent secretions, ear ache,   fever, chills, sweats, unintended wt loss, classically pleuritic or exertional cp, hemoptysis,  orthopnea pnd or leg swelling, presyncope, palpitations, abdominal pain, anorexia, nausea, vomiting, diarrhea  or change in bowel or bladder habits, change in stools or urine, dysuria,hematuria,  rash, arthralgias, visual complaints, headache, numbness, weakness or ataxia or problems with walking or coordination,  change in mood/affect or memory.               Objective:   Physical Exam    amb pleasant wm nad Filed Vitals:   02/10/16 0956  BP: 141/74  Pulse: 53  Temp: 97.4 F (36.3 C)  TempSrc: Oral  Height: 5' 8" (1.727 m)  Weight: 168 lb (76.204 kg)  SpO2:   100%      Vital signs reviewed    GEN: A/Ox3; pleasant , NAD,elderly   HEENT:  [place]/AT,  EACs-clear, TMs-wnl, NOSE-clear, THROAT-clear, no lesions, no postnasal drip or exudate noted. Noted blisters on lips c/w cold sores   NECK:  Supple w/ fair ROM; no JVD; normal carotid impulses w/o bruits; no thyromegaly or nodules palpated; no lymphadenopathy.  RESP  CTA w/  no accessory muscle use, no dullness to percussion  CARD:  RRR, no m/r/g  , no peripheral edema, pulses intact, no cyanosis or clubbing.  GI:   Soft & nt; nml bowel sounds; no organomegaly or masses detected.  Musco: Warm bil, no deformities or joint swelling noted.    Neuro: alert, no focal deficits noted.    Skin: Warm, no lesions or rashes        Lab Results  Component Value Date   ESRSEDRATE 12 01/13/2016   ESRSEDRATE 20 12/21/2015   ESRSEDRATE 94* 11/26/2015        CXR:  12/16/15 Stable since 12/07/2015. Interstitial opacities it is likely incomplete clearing of the patient's recent pneumonia. No new abnormality.     Lindaann Gradilla NP-C  [facility]  02/10/2016

## 2016-02-10 NOTE — Assessment & Plan Note (Signed)
O2 at bedtime Check ONO on RA to see if can d/c O2.

## 2016-02-10 NOTE — Telephone Encounter (Signed)
Notes Recorded by Julio Sicksammy S Parrett, NP on 02/10/2016 at 11:00 AM CXR is stable ,  Cont w/ taper off steroids as discussed at ov  follow up in 3 months and As needed   --------------- Spoke with pt, aware of results/recs.  Nothing further needed.

## 2016-02-11 NOTE — Progress Notes (Signed)
Chart and office note reviewed in detail s > agree with a/p as outlined   

## 2016-02-14 NOTE — Progress Notes (Signed)
Quick Note:  Called and spoke with pt. Reviewed results and recs. Pt voiced understanding and had no further questions. ______ 

## 2016-02-22 ENCOUNTER — Telehealth: Payer: Self-pay | Admitting: *Deleted

## 2016-02-22 ENCOUNTER — Telehealth: Payer: Self-pay | Admitting: Internal Medicine

## 2016-02-22 DIAGNOSIS — J9611 Chronic respiratory failure with hypoxia: Secondary | ICD-10-CM

## 2016-02-22 NOTE — Telephone Encounter (Signed)
Lmtcb x1

## 2016-02-22 NOTE — Telephone Encounter (Signed)
I called patient for 2845-month follow-up for REDS@Discharge  Study. I spoke with wife since patient was not home. She said patient has not been re-hospitalized in last 3 months. I asked her to thank patient for his participation in the study.

## 2016-02-23 NOTE — Telephone Encounter (Signed)
LMTCB x 1 

## 2016-02-23 NOTE — Telephone Encounter (Signed)
[facility] had not been [facility]ed over yet. She is sending now. Will give to MW for review. Pt advised.   [place]ing to [place] for follow up.   [phone] pt's cell / they are going out of town today

## 2016-02-23 NOTE — Telephone Encounter (Signed)
[phone] pt returning call

## 2016-02-23 NOTE — Telephone Encounter (Signed)
Looks good, d/c 02 effective today

## 2016-02-23 NOTE — Telephone Encounter (Signed)
Results placed in Dr Thurston HoleWert's lookat to be reviewed

## 2016-02-24 NOTE — Telephone Encounter (Signed)
[phone] calling back

## 2016-02-24 NOTE — Telephone Encounter (Signed)
Spoke with pt and gave ONO results and advised of d/c O2. Pt very pleased with his improvement. Order placed to d/c O2. Nothing further needed.

## 2016-02-28 ENCOUNTER — Telehealth: Payer: Self-pay | Admitting: Internal Medicine

## 2016-02-28 DIAGNOSIS — J9601 Acute respiratory failure with hypoxia: Secondary | ICD-10-CM

## 2016-02-28 NOTE — Telephone Encounter (Signed)
New order placed.  Melissa aware.  Nothing further needed.

## 2016-02-28 NOTE — Telephone Encounter (Signed)
D/c all 02

## 2016-02-28 NOTE — Telephone Encounter (Signed)
Per 02/22/16 telephone note Nyoka CowdenMichael B Wert, MD at 02/23/2016 5:05 PM     Status: Signed       Expand All Collapse All   Looks good, d/c 02 effective today          Called spoke with Melissa. They received an order to d/c nocturnal O2. She wants to know if pt needs to d/c all O2? If so, we need to place new order.  Please advise MW thanks

## 2016-03-01 ENCOUNTER — Encounter: Payer: Self-pay | Admitting: Adult Health

## 2016-05-12 ENCOUNTER — Ambulatory Visit (INDEPENDENT_AMBULATORY_CARE_PROVIDER_SITE_OTHER)
Admission: RE | Admit: 2016-05-12 | Discharge: 2016-05-12 | Disposition: A | Discharge status: Home / Self Care | Payer: Medicare Other | Source: Ambulatory Visit | Attending: Internal Medicine | Admitting: Internal Medicine

## 2016-05-12 ENCOUNTER — Encounter: Payer: Self-pay | Admitting: Internal Medicine

## 2016-05-12 ENCOUNTER — Ambulatory Visit (INDEPENDENT_AMBULATORY_CARE_PROVIDER_SITE_OTHER): Payer: Medicare Other | Admitting: Internal Medicine

## 2016-05-12 VITALS — BP 118/70 | HR 55 | Ht 69.0 in | Wt 173.6 lb

## 2016-05-12 DIAGNOSIS — J8489 Other specified interstitial pulmonary diseases: Secondary | ICD-10-CM

## 2016-05-12 DIAGNOSIS — I25810 Atherosclerosis of coronary artery bypass graft(s) without angina pectoris: Secondary | ICD-10-CM

## 2016-05-12 NOTE — Patient Instructions (Addendum)
Please remember to go to the x-ray department downstairs for your tests - we will call you with the results when they are available.   You should receive Prevnar 13 if haven't already - check with your primary care provider   Pulmonary follow up is only as needed

## 2016-05-12 NOTE — Progress Notes (Signed)
Subjective:    Patient ID: Jose [name], male    DOB: [birth year], 82 y.o.   MRN: [id number]  Brief patient profile:  85 yowm -quit smoking 1979 , seen for pulmonary consult 11/27/2015 for diffuse pulmonary infiltrates with elevated ESR ? 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   History of Present Illness  12/16/2015 [facility] Follow up / Prednisone 20  Pt returns for a 2 week post hospital follow up. Pt was admited 11/22/15 for acute on chronic diastolic CHF .he required aggressive diuresis . Echo showed EF 55-60%. With DD . CT chest showed diffuse bilateral aspdz with nonspecific mediastinal and bilateral adenopathy. He was seen by Dr. Melvyn Novas  For pulmonary consult for diffuse pulmonary infiltrates w/ high ESR . 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 Marland Kitchenlabs showed elevated RA factor . Neg ANCA, BGM Ab  and ANA  He was started on steroids . Discharged on prednisone taper and oxygen 3l/m at discharge.  Says he is feeling better but remains weak.  CXR done on 4/11 showed decreased interstitial edema.  Labs done by PCP showed elevated wbc ~19k.  Prior to admit , Played golf 3 days a week. And Walked most day .  rec Decrease Prednisone 64m daily   And leave there  Return for labs next week as planned , ESR and CBC w/ diff. (BMET from Dr. 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 Follow-up    Breathing is doing well. He has had min cough with yellow sputum in the am only. He has been exercising without any trouble.   monitoring his own sats and never under 90 % with any activity he wants to do  rec decrease prednisone slow taper    02/10/2016 NP  Follow up : Prob BOOP  Pt returns for 1 month follow up  He is feeling much better. Back to normal activities with golf and walking .  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 Steroid responsive.  He has been on  slow taper over last 2 months.  ESR went from 94 to 12.   rec Decrease Prednisone 525mevery other day until gone , then stop .  Chest xray today .  Set up for ONO on room air. .     05/12/2016  f/u ov/Jose [name] re:  Off pred since early July 2017  Chief Complaint  Patient presents with  . Follow-up    He tapered off of pred- stopped med first of July 2017 and he is doing well. No new co's today.    Not limited by breathing from desired activities    No obvious day to day or daytime variability or assoc excess/ purulent sputum or mucus plugs or hemoptysis or cp or chest tightness, subjective wheeze or overt sinus or hb symptoms. No unusual exp hx or h/o childhood pna/ asthma or knowledge of premature birth.  Sleeping ok without nocturnal  or early am exacerbation  of respiratory  c/o's or need for noct saba. Also denies any obvious fluctuation of symptoms with weather or environmental changes or other aggravating or alleviating factors except as outlined above   Current Medications, Allergies, Complete Past Medical History, Past Surgical History, Family History, and Social History were reviewed in [employer].  ROS  The following are not  active complaints unless bolded sore throat, dysphagia, dental problems, itching, sneezing,  nasal congestion or excess/ purulent secretions, ear ache,   fever, chills, sweats, unintended wt loss, classically pleuritic or exertional cp,  orthopnea pnd or leg swelling, presyncope, palpitations, abdominal pain, anorexia, nausea, vomiting, diarrhea  or change in bowel or bladder habits, change in stools or urine, dysuria,hematuria,  rash, arthralgias, visual complaints, headache, numbness, weakness or ataxia or problems with walking or coordination,  change in mood/affect or memory.                 Objective:   Physical Exam    amb pleasant wm nad   Wt Readings from Last 3 Encounters:  05/12/16 173 lb 9.6 oz (78.7 kg)    02/10/16 168 lb (76.2 kg)  01/26/16 166 lb (75.3 kg)    Vital signs reviewed  - sats 96% RA on arrival     GEN: A/Ox3; pleasant , NAD,elderly    HEENT:  [place]/AT,  EACs-clear, TMs-wnl, NOSE-clear, THROAT-clear, no lesions, no postnasal drip or exudate noted. Noted blisters on lips c/w cold sores   NECK:  Supple w/ fair ROM; no JVD; normal carotid impulses w/o bruits; no thyromegaly or nodules palpated; no lymphadenopathy.    RESP  CTA w/  no accessory muscle use, no dullness to percussion  CARD:  RRR, no m/r/g  , no peripheral edema, pulses intact, no cyanosis or clubbing.  GI:   Soft & nt; nml bowel sounds; no organomegaly or masses detected.   Musco: Warm bil, no deformities or joint swelling noted.   Neuro: alert, no focal deficits noted.    Skin: Warm, no lesions or rashes        Lab Results  Component Value Date   ESRSEDRATE 12 01/13/2016   ESRSEDRATE 20 12/21/2015   ESRSEDRATE 94* 11/26/2015         CXR PA and Lateral:   05/12/2016 :    I personally reviewed images and agree with radiology impression as follows:    The patient is status post CABG. There is cardiomegaly without edema. The lungs are clear. No pneumothorax or pleural effusion. Aortic atherosclerosis is noted.

## 2016-05-13 NOTE — Assessment & Plan Note (Signed)
Steroid rx 11/27/15 >>>d/c'd early July 2017 > no evidence of recurrence 05/12/2016 > pulmonary f/u prn    I had an extended final summary discussion with the patient reviewing all relevant studies completed to date and  lasting 15 to 20 minutes of a 25 minute visit on the following issues:    At this point very unlikely to recur as occurred in setting of atypical pna, not idiopathic version, at least as far as can tell from hx, and he has no underlying collagen vasc dz.   Pulmonary f/u can be prn sob/cough  Each maintenance medication was reviewed in detail including most importantly the difference between maintenance and as needed and under what circumstances the prns are to be used.  Please see instructions for details which were reviewed in writing and the patient given a copy.

## 2016-05-15 NOTE — Progress Notes (Signed)
Spoke with pt and notified of results per Dr. Wert. Pt verbalized understanding and denied any questions. 

## 2016-06-06 ENCOUNTER — Telehealth: Payer: Self-pay | Admitting: Pharmacist

## 2016-06-06 MED ORDER — RIVAROXABAN 15 MG PO TABS: 15.0000 mg | ORAL_TABLET | Freq: Every day | ORAL | 5 refills | Status: DC

## 2016-06-06 NOTE — Telephone Encounter (Signed)
RX refill for [facility]

## 2016-09-06 ENCOUNTER — Telehealth: Payer: Self-pay | Admitting: Cardiology

## 2016-09-06 NOTE — Telephone Encounter (Signed)
New message      Pt is at PCP office now.  They are calling to get update on medications.

## 2016-09-06 NOTE — Telephone Encounter (Signed)
Spoke with nurse Sherrie at Dr. Moreen FowlerBlomgrene's office. Sherrie requested information r/t Furosemide. Informed pt's chart shows Furosemide 10 mg daily Rx on 05/12/16 by historical provider - Dr. Sandrea HughsMichael Wert [facility]. Sherrie verbalized understanding.

## 2017-04-10 IMAGING — CR DG CHEST 2V
2 series · 2 of 2 positions shown · non-contrast
Comparison: 11/08/2015

CLINICAL DATA: Followup pneumonia

EXAM:
CHEST  2 VIEW

[w chest pa]
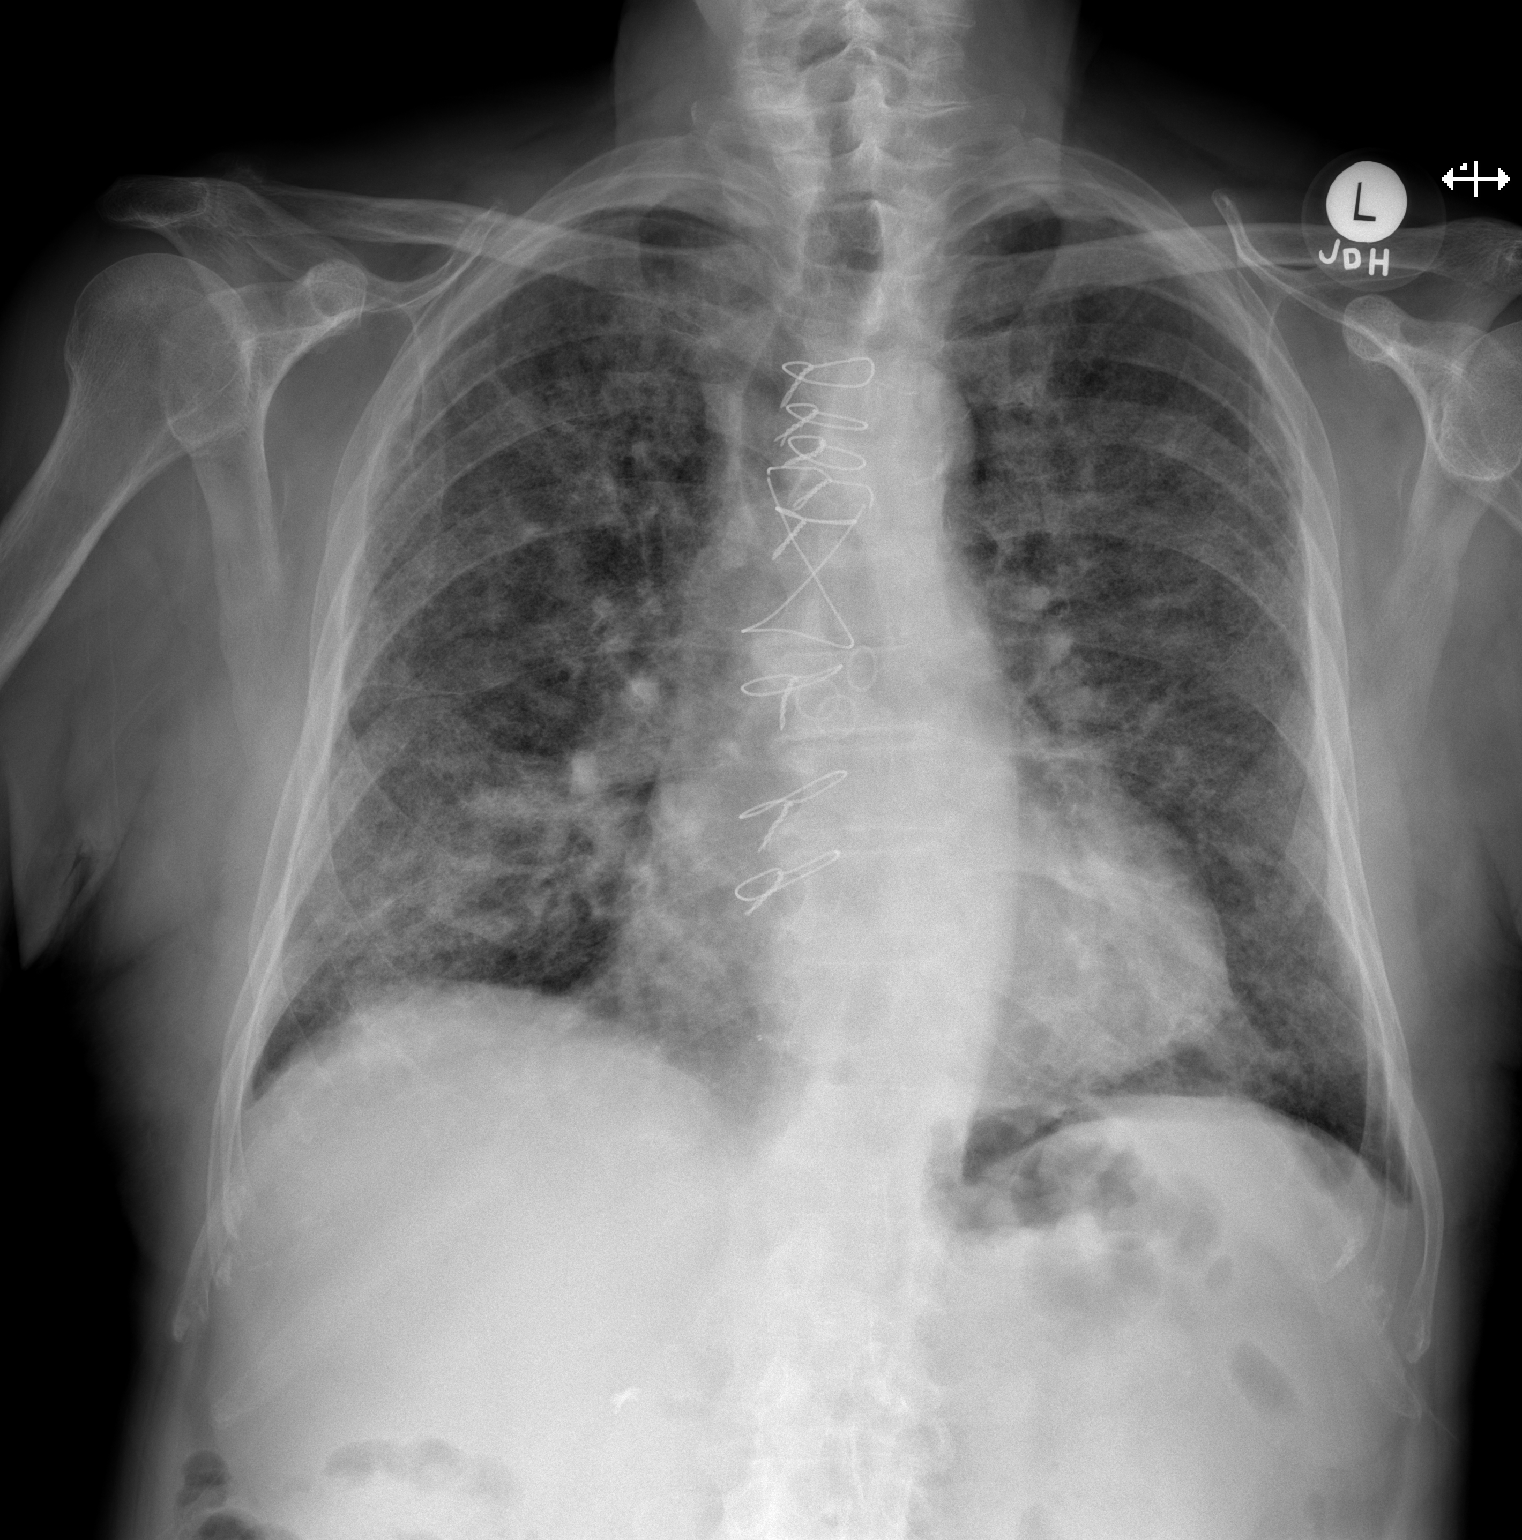

[w chest lat]
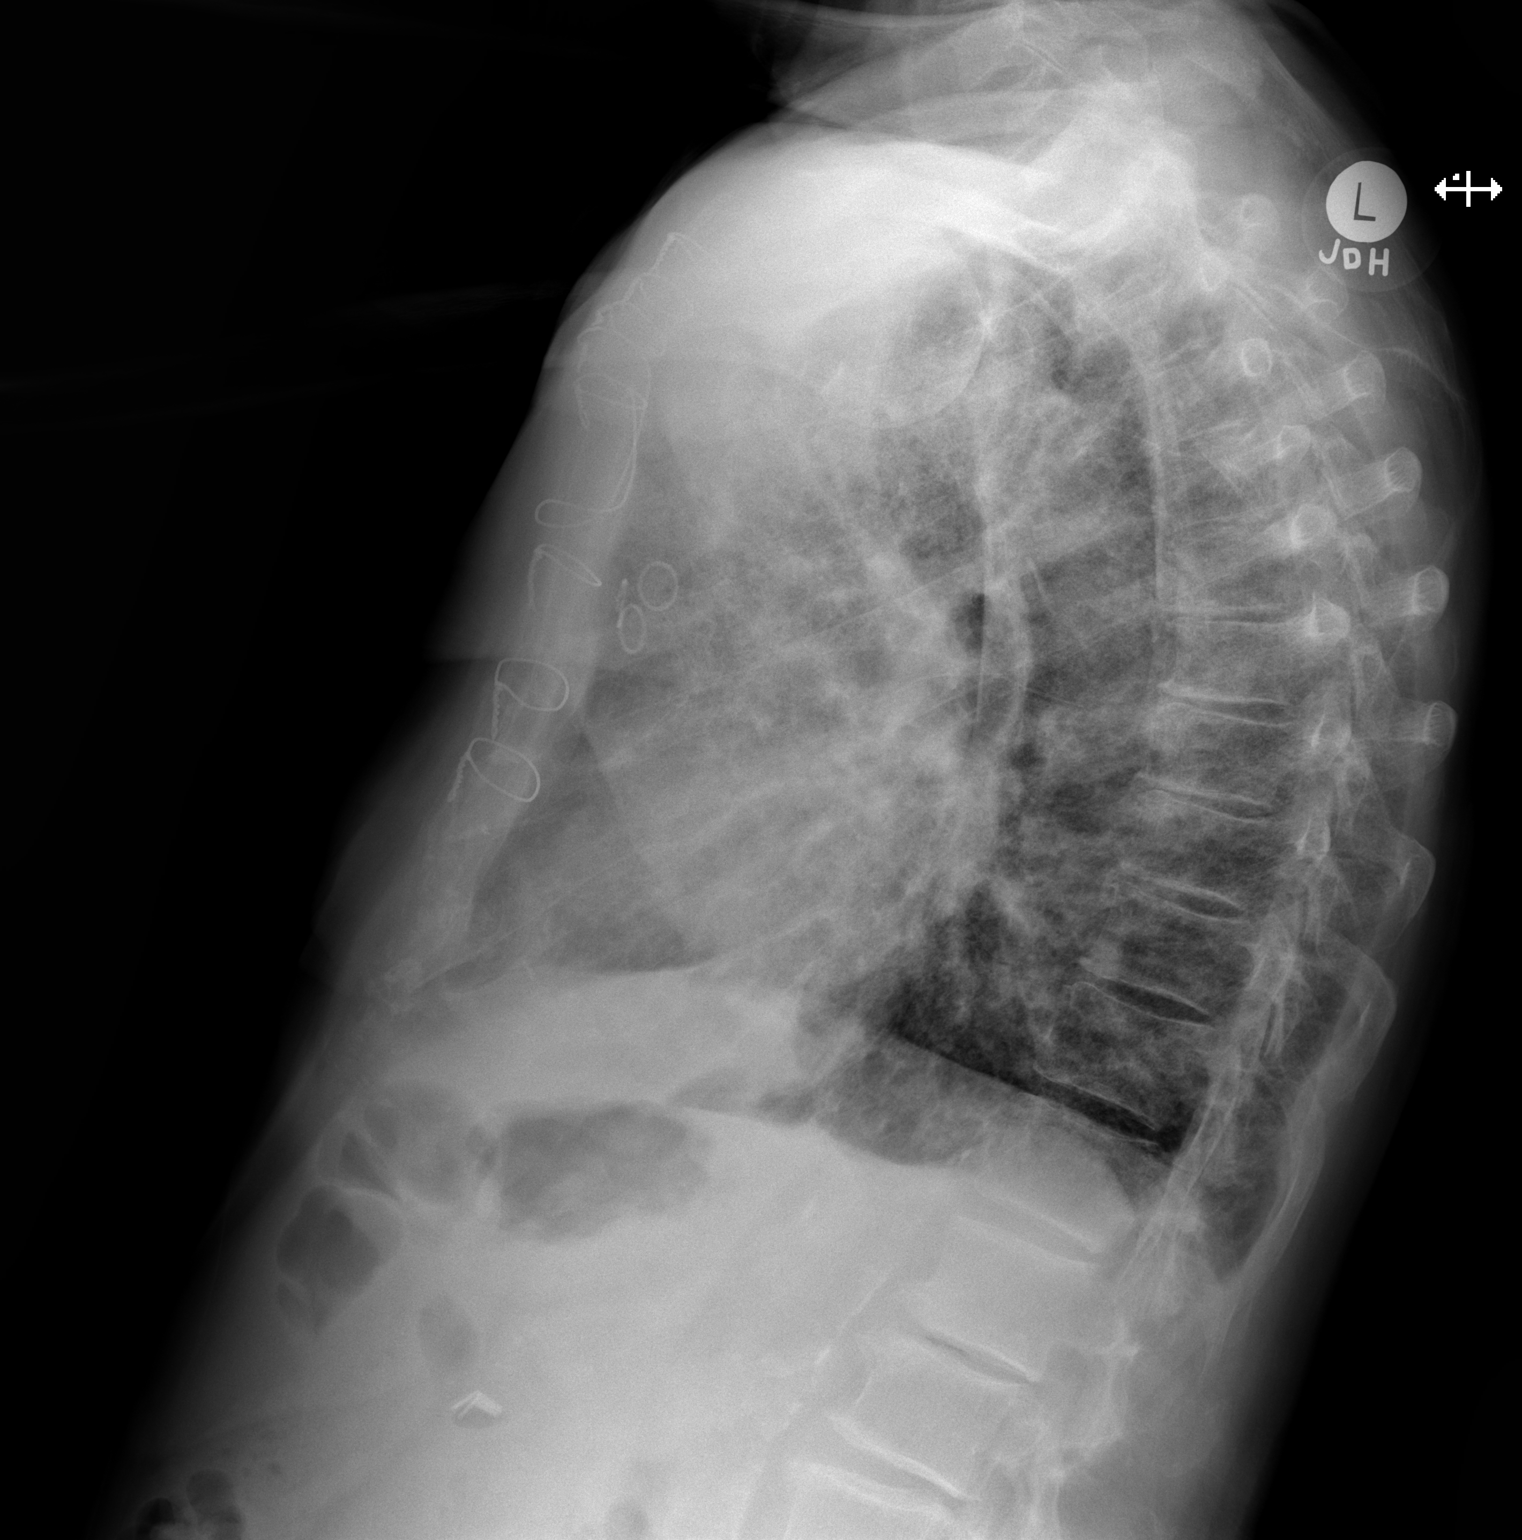

[2 of 2 positions shown; findings below may reference images not displayed]

FINDINGS: Cardiac shadow remains enlarged. Postsurgical changes are again
seen. Increased bilateral opacities are noted when compared with the
prior exam. This likely represents a component of superimposed edema
over the chronic changes seen previously. No focal confluent
infiltrate or effusion is noted. No bony abnormality is seen.
IMPRESSION: Diffuse increased opacities bilaterally likely related to pulmonary
edema.

## 2017-04-14 IMAGING — CT CT CHEST W/O CM
2 of 4 series · 15 of 36 positions shown, 18 images · non-contrast
Comparison: Chest radiograph on 10/17/2015 and 11/08/2015

CLINICAL DATA: Cough, shortness of breath, and weakness for 3
weeks.

EXAM:
CT CHEST WITHOUT CONTRAST
TECHNIQUE: Multidetector CT imaging of the chest was performed following the
standard protocol without IV contrast.

[Series 4: chest w/o 1mm st · axial · non-contrast · 0.73mm/px · z∈[-724,-445]mm · 12 of 383 slices shown, 15 images]
[im 17/383  mediastinal]
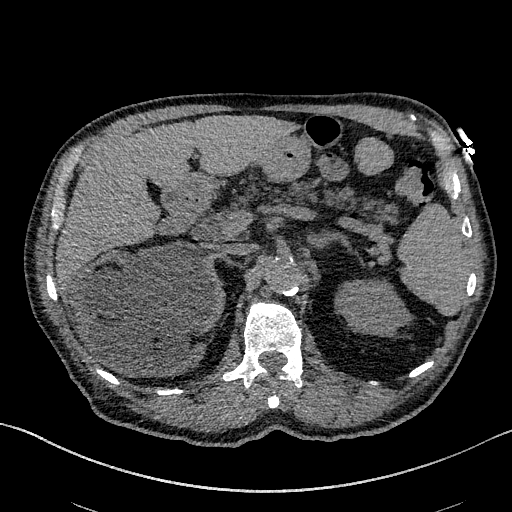
[im 17/383  lung]
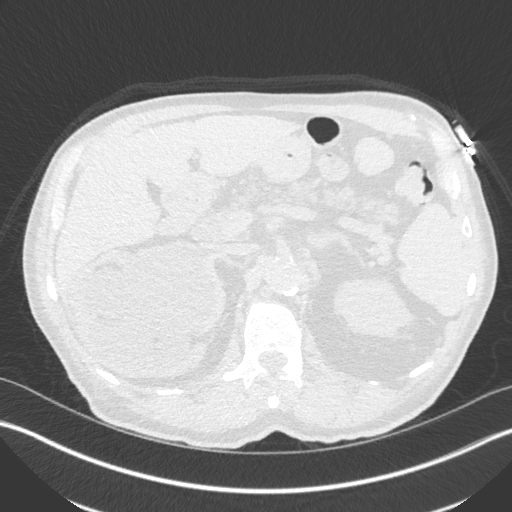
[im 50/383  lung]
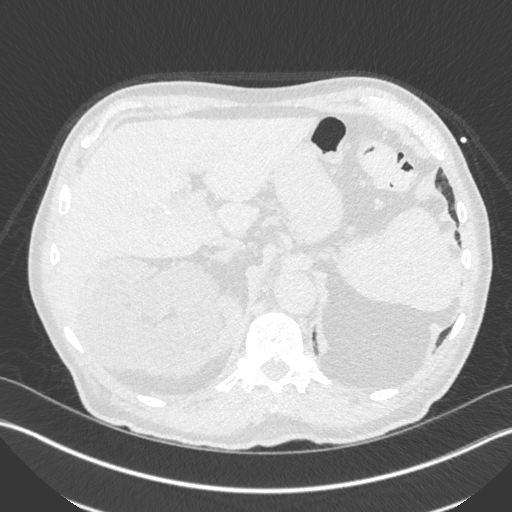
[im 84/383  lung]
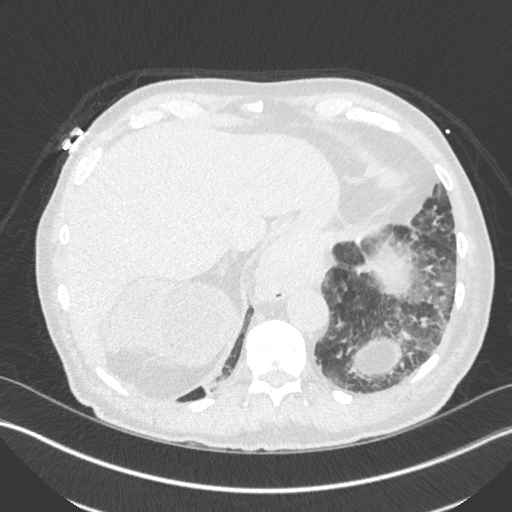
[im 117/383  lung]
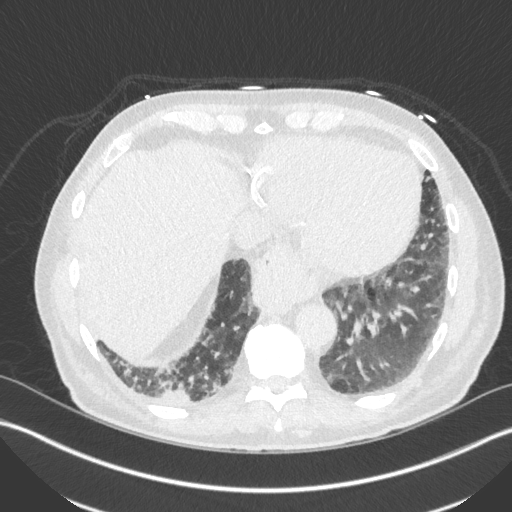
[im 150/383  mediastinal]
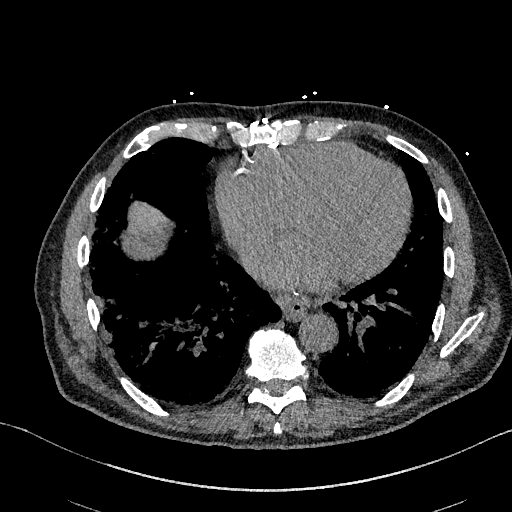
[im 150/383  lung]
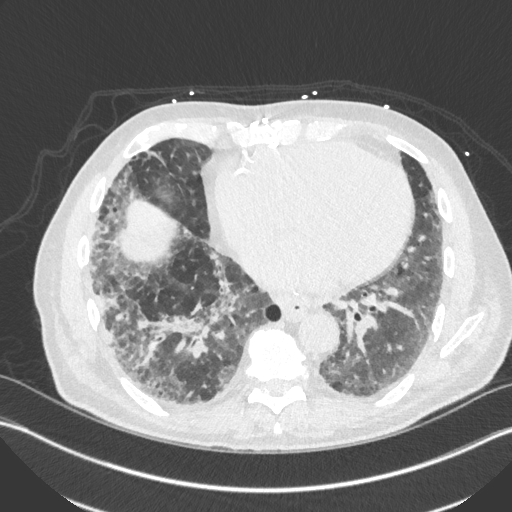
[im 183/383  lung]
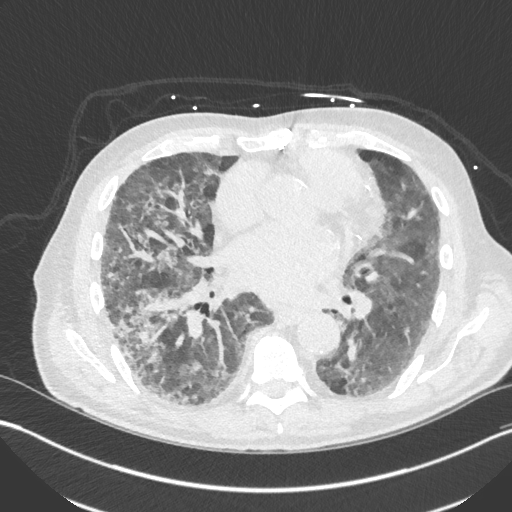
[im 200/383  lung]
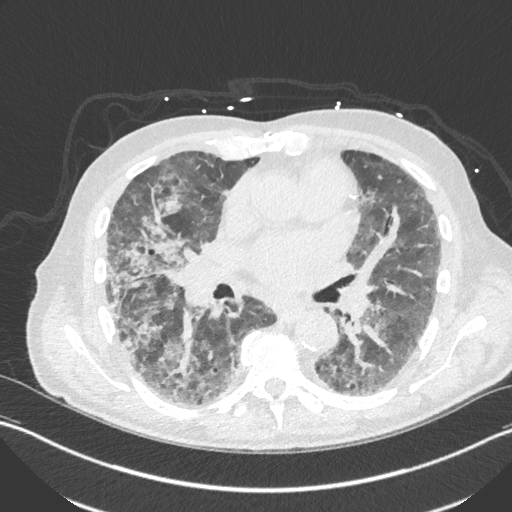
[im 233/383  lung]
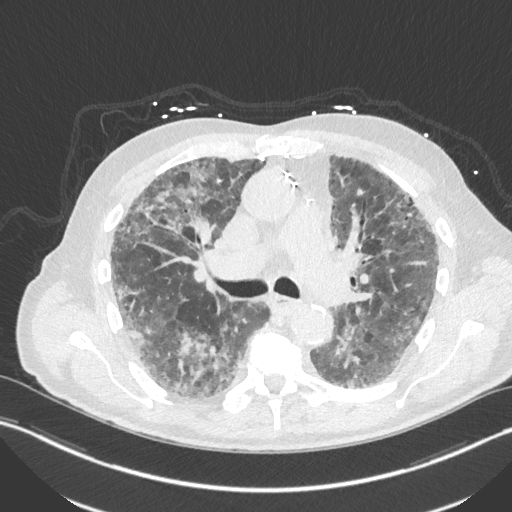
[im 266/383  mediastinal]
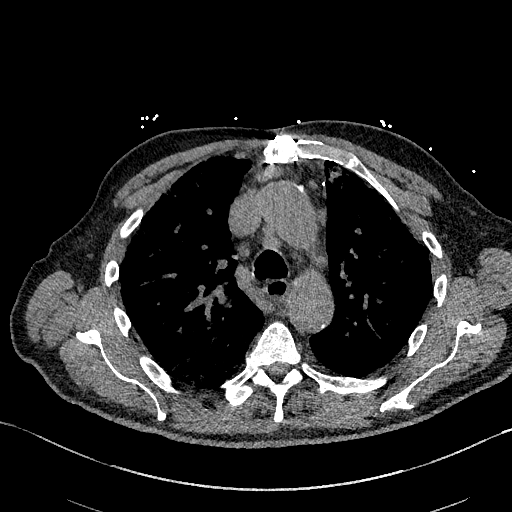
[im 266/383  lung]
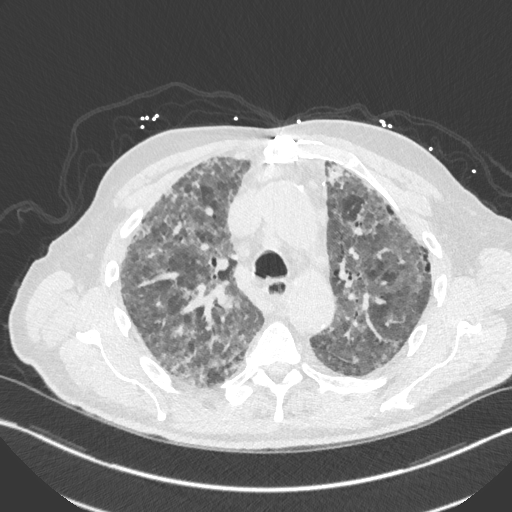
[im 299/383  lung]
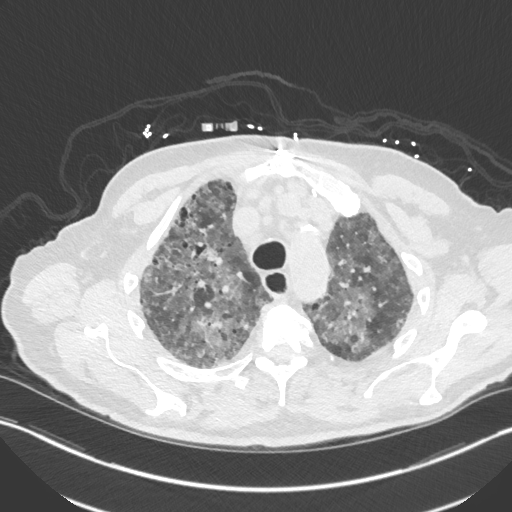
[im 333/383  lung]
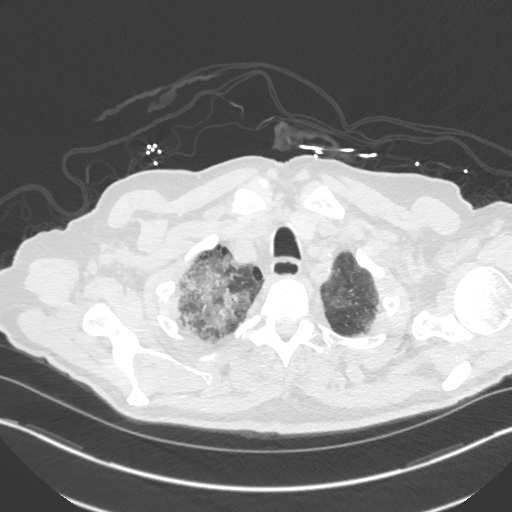
[im 366/383  lung]
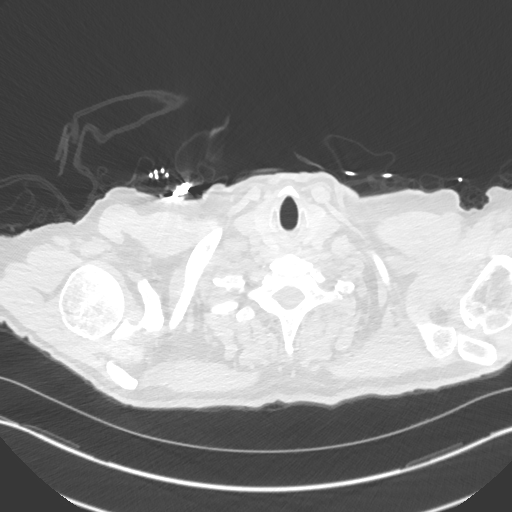

[Series 5: chest w/o 3mm st cor · coronal · non-contrast · 0.62mm/px · 3 of 87 slices shown]
[im 18/87  lung]
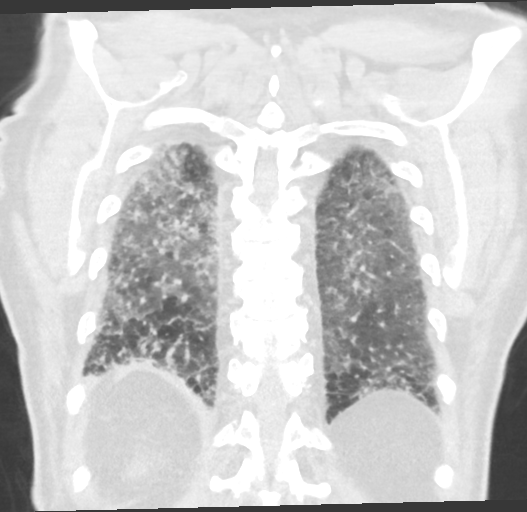
[im 35/87  lung]
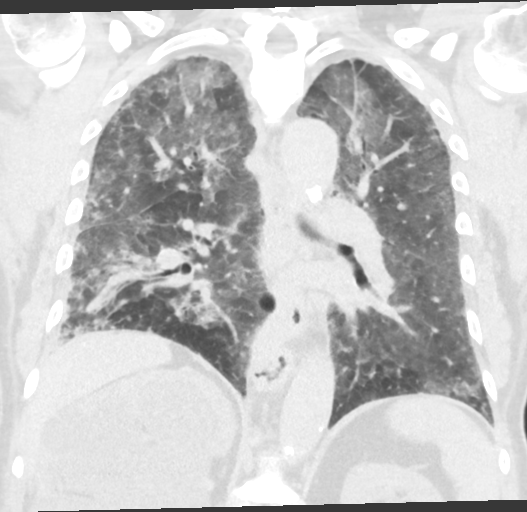
[im 52/87  lung]
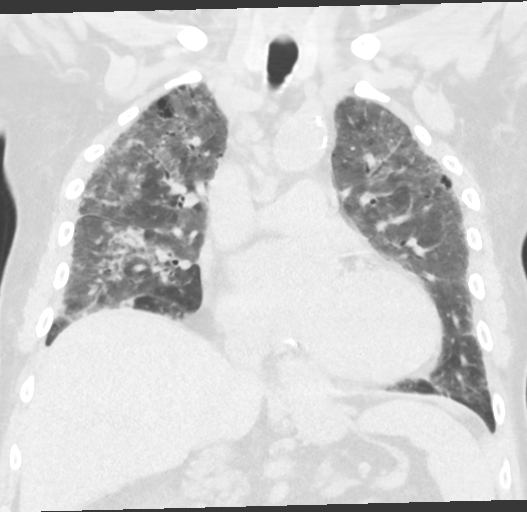

[15 of 36 positions shown; findings below may reference images not displayed]

FINDINGS: Mediastinum/Lymph Nodes: Mild cardiomegaly is noted without evidence
of pericardial effusion. A moderate size hiatal hernia is also seen.

Mild mediastinal and probable bilateral hilar lymphadenopathy is
noted. Largest index lymph node in the precarinal region measures
1.5 cm in short axis.

Lungs/Pleura: There is diffuse heterogeneous bilateral airspace
disease seen throughout both lungs, involving right lung slightly
worse than left. This is suspicious for diffuse pulmonary edema
although infection cannot be excluded. No evidence of pleural
effusion. Mild underlying emphysema noted.

Upper abdomen: Severe right hydronephrosis is seen, without
significant change compared to previous abdomen CT in 6091.

Musculoskeletal: No chest wall mass or suspicious bone lesions
identified.
IMPRESSION: Diffuse bilateral heterogeneous airspace disease superimposed on
mild emphysema. This is suspicious for diffuse pulmonary edema,
although infection cannot definitely be excluded.

Mild nonspecific mediastinal and probable bilateral hilar
lymphadenopathy. This may be reactive in etiology or secondary to
lymphedema.

Mild cardiomegaly and moderate hiatal hernia.

Chronic severe right hydronephrosis, without significant change
compared to previous CT in [DATE].

## 2017-06-11 NOTE — Telephone Encounter (Signed)
Close Encounter 

## 2017-06-13 ENCOUNTER — Other Ambulatory Visit: Payer: Self-pay | Admitting: Dermatology

## 2017-07-30 ENCOUNTER — Other Ambulatory Visit: Payer: Self-pay | Admitting: Cardiology

## 2017-08-07 ENCOUNTER — Ambulatory Visit: Payer: Medicare Other | Admitting: Podiatry

## 2017-08-16 ENCOUNTER — Ambulatory Visit (INDEPENDENT_AMBULATORY_CARE_PROVIDER_SITE_OTHER): Payer: Medicare Other | Admitting: Podiatry

## 2017-08-16 DIAGNOSIS — B351 Tinea unguium: Secondary | ICD-10-CM | POA: Diagnosis not present

## 2017-08-16 DIAGNOSIS — Q828 Other specified congenital malformations of skin: Secondary | ICD-10-CM

## 2017-08-16 DIAGNOSIS — D689 Coagulation defect, unspecified: Secondary | ICD-10-CM | POA: Diagnosis not present

## 2017-08-16 DIAGNOSIS — M79674 Pain in right toe(s): Secondary | ICD-10-CM | POA: Diagnosis not present

## 2017-08-16 DIAGNOSIS — M79675 Pain in left toe(s): Secondary | ICD-10-CM

## 2017-08-22 NOTE — Progress Notes (Signed)
Subjective:   Patient ID: Jose [name], male   DOB: 83 y.o.   MRN: [id number]   HPI Vern presents the office today for concerns ofthick painful callus to the ball of his left foot which is been ongoing for some time.  It previously was trimmed which did help but it does come back.  He denies any redness or drainage or swelling to the foot.  He also states his nails are thick, elongated cannot trim them himself.  They are painful with pressure in shoes.  Denies any redness or drainage or any swelling.  He has no other concerns today.   Review of Systems  All other systems reviewed and are negative.  Past Medical History:  Diagnosis Date  . At risk for sleep apnea    STOP-BANG= 5   SENT TO PCP 05-20-2014  . Bilateral carotid artery stenosis    mild bilateral proximcal ICA  40% per duplex 11/ 2014  . Coronary artery disease    a. s/p CABG in 2007 w/ LIMA-LAD, SVG-D1, SVG-1st & 2nd Mrg, SVG-PDA and posterior lateral  . ED (erectile dysfunction)   . Eye infection 12/2014  . GERD (gastroesophageal reflux disease)   . History of embolic stroke no residual   30/ 2014  --  right PCA and MCA branch infarts (left side vision difficulties)  . Hypertension   . Other malaise and fatigue   . Paroxysmal atrial fibrillation (HCC)   . Pneumonia 10/2015   led to CHF, in hospital x 6 days  . S/P CABG x 6    2007  . Ureteral obstruction, right     Past Surgical History:  Procedure Laterality Date  . APPENDECTOMY  1950's  . CARDIAC CATHETERIZATION  10-31-2005  dr wall   severe three vessel/  perserved LV  . CARDIOVASCULAR STRESS TEST  last one 05-19-2014  dr Jens Som   low risk lexiscan no exercise study/ small inferobasal infarct with no ischemia /  ef 53%  . CORONARY ARTERY BYPASS GRAFT  11-01-2005  dr Cornelius Moras   CLOSURE OF PFO/  LIMA to LAD,  SVG to D1,  SVG to 1st & 2nd  MARGINAL branches, SVG  to PDA and posterior lateral  . CYSTOSCOPY W/ URETERAL STENT PLACEMENT Right 05/21/2014   Procedure:  CYSTOSCOPY WITH RETROGRADE PYELOGRAM/URETERAL STENT PLACEMENT;  Surgeon: Kathi Ludwig, MD;  Location: [facility];  Service: Urology;  Laterality: Right;  . CYSTOSCOPY WITH URETEROSCOPY Right 05/21/2014   Procedure: CYSTOSCOPY WITH URETEROSCOPY;  Surgeon: Kathi Ludwig, MD;  Location: [facility];  Service: Urology;  Laterality: Right;  . INGUINAL HERNIA REPAIR Bilateral   . LAPAROSCOPIC CHOLECYSTECTOMY  10-02-2000  . RIGHT URETER OBSTRUCTION SURGERY  age 9  . TEE WITHOUT CARDIOVERSION N/A 07/17/2013   Procedure: TRANSESOPHAGEAL ECHOCARDIOGRAM (TEE);  Surgeon: Wendall Stade, MD;  Location: [facility] ENDOSCOPY;  Service: Cardiovascular;  Laterality: N/A;  normal LV size, mild prolapse of posterior mitral valve leaflet,  mild MR and TR, normal AV,  no LAA thrombus,  atrial septal aneurysm with no PFO/ ASD and negative bubble study, normal RV, normal aorta with no debris     Current Outpatient Medications:  .  carvedilol (COREG) 3.125 MG tablet, Take 1 tablet (3.125 mg total) by mouth 2 (two) times daily with a meal., Disp: 60 tablet, Rfl: 12 .  COD LIVER OIL PO, Take 1 capsule by mouth at bedtime. , Disp: , Rfl:  .  desonide (DESOWEN) 0.05 %  cream, Apply 1 application topically every other day. Reported on 02/10/2016, Disp: , Rfl: 0 .  furosemide (LASIX) 20 MG tablet, Take 10 mg by mouth daily., Disp: , Rfl:  .  losartan (COZAAR) 50 MG tablet, Take 50 mg by mouth daily., Disp: , Rfl:  .  Multiple Vitamin (MULTIVITAMIN WITH MINERALS) TABS tablet, Take 1 tablet by mouth daily., Disp: , Rfl:  .  omeprazole (PRILOSEC OTC) 20 MG tablet, Take 20 mg by mouth daily., Disp: , Rfl:  .  Polyethyl Glycol-Propyl Glycol (SYSTANE ULTRA OP), Place 1 drop into both eyes 2 (two) times daily., Disp: , Rfl:  .  polyethylene glycol (MIRALAX / GLYCOLAX) packet, Take 17 g by mouth daily. Mix in 8 oz liquid and drink, Disp: , Rfl:  .  pravastatin (PRAVACHOL) 80 MG tablet, Take 80 mg  by mouth at bedtime. , Disp: , Rfl:  .  Rivaroxaban (XARELTO) 15 MG TABS tablet, Take 1 tablet (15 mg total) by mouth daily with supper. NEEDS CARDIOLOGIST APPOINTMENT BEFORE NEXT OFFICE VISIT, Disp: 15 tablet, Rfl: 0 .  saw palmetto 160 MG capsule, Take 160 mg by mouth 2 (two) times daily.  , Disp: , Rfl:  .  tadalafil (CIALIS) 5 MG tablet, Take 5 mg by mouth at bedtime. , Disp: , Rfl:  .  TESTOSTERONE TD, Place 1 mL onto the skin daily. Apply every morning to back shoulder - 10% lipoderm cream compounded at [facility], Disp: , Rfl:   Allergies  Allergen Reactions  . Cephalexin Anaphylaxis    Pt says he has tolerated PCN in the past  . Iodinated Diagnostic Agents Anaphylaxis  . Iohexol Anaphylaxis     THROAT SWELLS AND STOPS BREATHING   . Ace Inhibitors Cough  . Metoprolol Other (See Comments)    Erectile dysfunction  . Viagra [Sildenafil Citrate] Other (See Comments)    Headache and vision changes  . Ciprofloxacin Hives, Itching and Rash  . Codeine Nausea And Vomiting and Other (See Comments)    headaches  . Sulfa Antibiotics Rash    Social History   Socioeconomic History  . Marital status: Married    Spouse name: maxine  . Number of children: 4  . Years of education: college4  . Highest education level: Not on file  Social Needs  . Financial resource strain: Not on file  . Food insecurity - worry: Not on file  . Food insecurity - inability: Not on file  . Transportation needs - medical: Not on file  . Transportation needs - non-medical: Not on file  Occupational History  . Occupation: retired  Tobacco Use  . Smoking status: Former Smoker    Packs/day: 1.00    Years: 35.00    Pack years: 35.00    Types: Cigarettes    Last attempt to quit: 08/29/1977    Years since quitting: 40.0  . Smokeless tobacco: Never Used  Substance and Sexual Activity  . Alcohol use: No    Alcohol/week: 0.0 oz  . Drug use: No  . Sexual activity: Not on file  Other Topics Concern   . Not on file  Social History Narrative   Patient is married with 4 children.   Patient is right handed.   Patient has college education.   Patient drinks decaff coffee.        Objective:  Physical Exam  General: AAO x3, NAD  Dermatological: Nails are hypertrophic, dystrophic, brittle, discolored, elongated 10. No surrounding redness or drainage. Tenderness nails 1-5 bilaterally.  Hyperkeratotic lesion left foot some metatarsal 4.  No underlying ulceration, drainage or any clinical signs of infection are noted today.  No open lesions or other pre-ulcerative lesions are identified today.  Vascular: Dorsalis Pedis artery and Posterior Tibial artery pedal pulses are 2/4 bilateral with immedate capillary fill time. There is no pain with calf compression, swelling, warmth, erythema.   Neruologic: Grossly intact via light touch bilateral. Protective threshold with Semmes Wienstein monofilament intact to all pedal sites bilateral.   Musculoskeletal: No gross boney pedal deformities bilateral. No pain, crepitus, or limitation noted with foot and ankle range of motion bilateral. Muscular strength 5/5 in all groups tested bilateral.  Gait: Unassisted, Nonantalgic.      Assessment:   83 year old male with symptomatic onychomycosis, hyperkeratotic lesion     Plan:  -Treatment options discussed including all alternatives, risks, and complications -Etiology of symptoms were discussed -Nails debrided 10 without complications or bleeding. -Hyperkeratotic lesion was sharply debrided x1 without any complications or bleeding. -We discussed an accommodative orthotic to help take pressure off of the lesion but due to cost he wished to hold off on that. -Daily foot inspection -Follow-up in 3 months or sooner if any problems arise. In the meantime, encouraged to call the office with any questions, concerns, change in symptoms.   Ovid CurdMatthew Wagoner, DPM

## 2017-10-08 ENCOUNTER — Telehealth: Payer: Self-pay | Admitting: *Deleted

## 2017-10-08 ENCOUNTER — Other Ambulatory Visit: Payer: Self-pay | Admitting: Cardiology

## 2017-10-08 DIAGNOSIS — I480 Paroxysmal atrial fibrillation: Secondary | ICD-10-CM

## 2017-10-08 MED ORDER — RIVAROXABAN 15 MG PO TABS: 15.0000 mg | ORAL_TABLET | Freq: Every day | ORAL | 1 refills | Status: DC

## 2017-10-08 NOTE — Telephone Encounter (Signed)
New message     *STAT* If patient is at the pharmacy, call can be transferred to refill team.   1. Which medications need to be refilled? (please list name of each medication and dose if known) Rivaroxaban (XARELTO) 15 MG TABS tablet  2. Which pharmacy/location (including street and city if local pharmacy) is medication to be sent to? [facility] [place]  3. Do they need a 30 day or 90 day supply? 30

## 2017-10-08 NOTE — Telephone Encounter (Signed)
NO metabolic panel done since 2017 (noted new appt to see DR Jens Somrenshaw on 10/2017)  [facility]ill for 15 days [facility]/31/2017  Refill for 30 days supply (no refills) [facility] to [facility] today 10/08/2017 to provide medication until f/u visit.

## 2017-10-08 NOTE — Telephone Encounter (Signed)
Patient left a msg on the refill vm stating that he is completely out of xarelto. He requested a call back at [phone]. Thanks, MI

## 2017-10-09 ENCOUNTER — Encounter: Payer: Self-pay | Admitting: Podiatry

## 2017-10-09 ENCOUNTER — Ambulatory Visit (INDEPENDENT_AMBULATORY_CARE_PROVIDER_SITE_OTHER): Payer: Medicare Other | Admitting: Podiatry

## 2017-10-09 DIAGNOSIS — B351 Tinea unguium: Secondary | ICD-10-CM

## 2017-10-09 DIAGNOSIS — M79674 Pain in right toe(s): Secondary | ICD-10-CM

## 2017-10-09 DIAGNOSIS — M216X2 Other acquired deformities of left foot: Secondary | ICD-10-CM | POA: Diagnosis not present

## 2017-10-09 DIAGNOSIS — Q828 Other specified congenital malformations of skin: Secondary | ICD-10-CM | POA: Diagnosis not present

## 2017-10-09 DIAGNOSIS — D689 Coagulation defect, unspecified: Secondary | ICD-10-CM

## 2017-10-09 DIAGNOSIS — M79675 Pain in left toe(s): Secondary | ICD-10-CM

## 2017-10-10 MED ORDER — RIVAROXABAN 15 MG PO TABS: 15.0000 mg | ORAL_TABLET | Freq: Every day | ORAL | 1 refills | Status: DC

## 2017-10-10 NOTE — Telephone Encounter (Signed)
Rx has been sent to the pharmacy electronically. ° °

## 2017-10-10 NOTE — Telephone Encounter (Signed)
Follow up     *STAT* If patient is at the pharmacy, call can be transferred to refill team.   1. Which medications need to be refilled? (please list name of each medication and dose if known) Rivaroxaban (XARELTO) 15 MG TABS tablet  2. Which pharmacy/location (including street and city if local pharmacy) is medication to be sent to?[facility] [phone]   3. Do they need a 30 day or 90 day supply? 30 days supply

## 2017-10-14 NOTE — Progress Notes (Signed)
Subjective: 84 y.o. returns the office today for concerns of a painful callus on the ball of his left foot submetatarsal 4.  He states that after he trimmed it did well but came back causing pain.  He denies any redness or drainage or any swelling.  He has no other concerns today. Denies any systemic complaints such as fevers, chills, nausea, vomiting.   PCP: Mosetta PuttBlomgren, Peter, MD   Objective: AAO 3, NAD DP/PT pulses palpable, CRT less than 3 seconds Hyperkeratotic lesion left foot submetatarsal 4.  Thick hyperkeratotic lesion.  Upon debridement there is no underlying ulceration, drainage or any signs of infection noted today.  There is prominence of metatarsal heads plantarly with atrophy of the fat pad.  The nails appear to be mildly elongated without any redness or drainage or any swelling.  There is no other open lesions or pre-ulcerative lesion identified today. No other areas of tenderness bilateral lower extremities. No overlying edema, erythema, increased warmth. No pain with calf compression, swelling, warmth, erythema.  Assessment: Patient presents with symptomatic porokeratosis  Plan: -Treatment options including alternatives, risks, complications were discussed -Hyperkeratotic lesion was sharply debrided x1 without any complications or bleeding.  The callus did come back quite quickly.  Discussed alternatives as to help offload the area.  We discussed an orthotic and would like to proceed with this.  [name] did see him today and will him for orthotics.  We will see him back in 4 weeks to pick this up or sooner if needed. -Nails sharply debrided 10 without complication/bleeding although the nails were mildly elongated. -Discussed daily foot inspection. If there are any changes, to call the office immediately.   Ovid CurdMatthew Wagoner, DPM

## 2017-10-23 ENCOUNTER — Other Ambulatory Visit: Payer: Medicare Other

## 2017-11-07 ENCOUNTER — Other Ambulatory Visit: Payer: Medicare Other | Admitting: Orthotics

## 2017-11-12 NOTE — Progress Notes (Signed)
HPI: FU CAD and atrial fibrillation. Patient underwent coronary artery bypass and graft in 2007. He had a LIMA to the LAD, saphenous vein graft to first diagonal, sequential saphenous vein graft to the first marginal and second marginal, sequential saphenous vein graft to the PDA and posterior lateral. He also had closure of PFO. Abdominal ultrasound May 2014 showed no aneurysm. Had CVA in November of 2014. Transesophageal echocardiogram November 2014 showed normal LV function, mild prolapse of the posterior mitral valve leaflet and an atrial septal aneurysm with negative bubble study. Carotid Dopplers November 2014 showed less than 40% bilateral stenosis. 30 day event monitor showed atrial fibrillation. Echocardiogram March 2017 showed normal LV systolic function, grade 1 diastolic dysfunction, moderate left atrial enlargement and mild tricuspid regurgitation. Nuclear study May 2017 showed ejection fraction 53%.  There was diaphragmatic attenuation but no ischemia.  Since last seen, the patient denies any dyspnea on exertion, orthopnea, PND, pedal edema, palpitations, syncope or chest pain.   Current Outpatient Medications  Medication Sig Dispense Refill  . carvedilol (COREG) 3.125 MG tablet Take 1 tablet (3.125 mg total) by mouth 2 (two) times daily with a meal. 60 tablet 12  . COD LIVER OIL PO Take 1 capsule by mouth at bedtime.     . furosemide (LASIX) 20 MG tablet Take 10 mg by mouth daily.    Marland Kitchen. losartan (COZAAR) 50 MG tablet Take 50 mg by mouth daily.    . Multiple Vitamin (MULTIVITAMIN WITH MINERALS) TABS tablet Take 1 tablet by mouth daily.    Marland Kitchen. omeprazole (PRILOSEC OTC) 20 MG tablet Take 20 mg by mouth daily.    . pravastatin (PRAVACHOL) 80 MG tablet Take 80 mg by mouth at bedtime.     . Rivaroxaban (XARELTO) 15 MG TABS tablet Take 1 tablet (15 mg total) by mouth daily with supper. 30 tablet 1  . saw palmetto 160 MG capsule Take 160 mg by mouth 2 (two) times daily.      . tadalafil  (CIALIS) 5 MG tablet Take 5 mg by mouth at bedtime.     . TESTOSTERONE TD Place 1 mL onto the skin daily. Apply every morning to back shoulder - 10% lipoderm cream [facility]     No current facility-administered medications for this visit.      Past Medical History:  Diagnosis Date  . At risk for sleep apnea    STOP-BANG= 5   SENT TO PCP 05-20-2014  . Bilateral carotid artery stenosis    mild bilateral proximcal ICA  40% per duplex 11/ 2014  . Coronary artery disease    a. s/p CABG in 2007 w/ LIMA-LAD, SVG-D1, SVG-1st & 2nd Mrg, SVG-PDA and posterior lateral  . ED (erectile dysfunction)   . Eye infection 12/2014  . GERD (gastroesophageal reflux disease)   . History of embolic stroke no residual   14/11/ 2014  --  right PCA and MCA branch infarts (left side vision difficulties)  . Hypertension   . Other malaise and fatigue   . Paroxysmal atrial fibrillation (HCC)   . Pneumonia 10/2015   led to CHF, in hospital x 6 days  . S/P CABG x 6    2007  . Ureteral obstruction, right     Past Surgical History:  Procedure Laterality Date  . APPENDECTOMY  1950's  . CARDIAC CATHETERIZATION  10-31-2005  dr wall   severe three vessel/  perserved LV  . CARDIOVASCULAR STRESS TEST  last one  05-19-2014  dr Jens Som   low risk lexiscan no exercise study/ small inferobasal infarct with no ischemia /  ef 53%  . CORONARY ARTERY BYPASS GRAFT  11-01-2005  dr Cornelius Moras   CLOSURE OF PFO/  LIMA to LAD,  SVG to D1,  SVG to 1st & 2nd  MARGINAL branches, SVG  to PDA and posterior lateral  . CYSTOSCOPY W/ URETERAL STENT PLACEMENT Right 05/21/2014   Procedure: CYSTOSCOPY WITH RETROGRADE PYELOGRAM/URETERAL STENT PLACEMENT;  Surgeon: Kathi Ludwig, MD;  Location: [facility];  Service: Urology;  Laterality: Right;  . CYSTOSCOPY WITH URETEROSCOPY Right 05/21/2014   Procedure: CYSTOSCOPY WITH URETEROSCOPY;  Surgeon: Kathi Ludwig, MD;  Location: [facility];   Service: Urology;  Laterality: Right;  . INGUINAL HERNIA REPAIR Bilateral   . LAPAROSCOPIC CHOLECYSTECTOMY  10-02-2000  . RIGHT URETER OBSTRUCTION SURGERY  age 76  . TEE WITHOUT CARDIOVERSION N/A 07/17/2013   Procedure: TRANSESOPHAGEAL ECHOCARDIOGRAM (TEE);  Surgeon: Wendall Stade, MD;  Location: [facility] ENDOSCOPY;  Service: Cardiovascular;  Laterality: N/A;  normal LV size, mild prolapse of posterior mitral valve leaflet,  mild MR and TR, normal AV,  no LAA thrombus,  atrial septal aneurysm with no PFO/ ASD and negative bubble study, normal RV, normal aorta with no debris    Social History   Socioeconomic History  . Marital status: Married    Spouse name: maxine  . Number of children: 4  . Years of education: college4  . Highest education level: Not on file  Occupational History  . Occupation: retired  Engineer, production  . Financial resource strain: Not on file  . Food insecurity:    Worry: Not on file    Inability: Not on file  . Transportation needs:    Medical: Not on file    Non-medical: Not on file  Tobacco Use  . Smoking status: Former Smoker    Packs/day: 1.00    Years: 35.00    Pack years: 35.00    Types: Cigarettes    Last attempt to quit: 08/29/1977    Years since quitting: 40.2  . Smokeless tobacco: Never Used  Substance and Sexual Activity  . Alcohol use: No    Alcohol/week: 0.0 oz  . Drug use: No  . Sexual activity: Not on file  Lifestyle  . Physical activity:    Days per week: Not on file    Minutes per session: Not on file  . Stress: Not on file  Relationships  . Social connections:    Talks on phone: Not on file    Gets together: Not on file    Attends religious service: Not on file    Active member of club or organization: Not on file    Attends meetings of clubs or organizations: Not on file    Relationship status: Not on file  . Intimate partner violence:    Fear of current or ex partner: Not on file    Emotionally abused: Not on file    Physically  abused: Not on file    Forced sexual activity: Not on file  Other Topics Concern  . Not on file  Social History Narrative   Patient is married with 4 children.   Patient is right handed.   Patient has college education.   Patient drinks decaff coffee.    Family History  Problem Relation Age of Onset  . Heart disease Mother        PPM  . Hypertension Mother   .  Lymphoma Father   . Prostate cancer Father   . Prostate cancer Brother   . Aneurysm Brother        REPAIR  . CAD Brother        CABG  . Other Brother        STENTS IN LEGS  . Other Sister        STOMACH ISSUES  . Depression Sister   . Lung cancer Brother        Asbestosis    ROS: no fevers or chills, productive cough, hemoptysis, dysphasia, odynophagia, melena, hematochezia, dysuria, hematuria, rash, seizure activity, orthopnea, PND, pedal edema, claudication. Remaining systems are negative.  Physical Exam: Well-developed well-nourished in no acute distress.  Skin is warm and dry.  HEENT is normal.  Neck is supple.  Chest is clear to auscultation with normal expansion.  Cardiovascular exam is regular rate and rhythm.  Abdominal exam nontender or distended. No masses palpated. Extremities show no edema. neuro grossly intact  ECG-sinus bradycardia specific ST changes.  First-degree AV block.  Personally reviewed  A/P  1 coronary artery disease-patient doing well with no chest pain.  Continue statin.  No aspirin given need for anticoagulation.  2 chronic diastolic congestive heart failure-present dose of Lasix.  Patient is euvolemic.  Patient instructed on low-sodium diet and fluid restriction.  3 hypertension-blood pressure is elevated.  However he states typically controlled.  Continue present medications and follow.  4 hyperlipidemia-continue statin.  5 atrial fibrillation paroxysmal-patient remains in sinus rhythm today.  Continue beta-blocker for rate control if atrial fibrillation recurs.  Continue  Xarelto.  Olga Millers, MD

## 2017-11-20 ENCOUNTER — Ambulatory Visit: Payer: Medicare Other | Admitting: Podiatry

## 2017-11-21 ENCOUNTER — Ambulatory Visit (INDEPENDENT_AMBULATORY_CARE_PROVIDER_SITE_OTHER): Payer: Medicare Other | Admitting: Cardiology

## 2017-11-21 ENCOUNTER — Encounter: Payer: Self-pay | Admitting: Cardiology

## 2017-11-21 VITALS — BP 163/74 | HR 56 | Ht 69.0 in | Wt 178.4 lb

## 2017-11-21 DIAGNOSIS — I10 Essential (primary) hypertension: Secondary | ICD-10-CM

## 2017-11-21 DIAGNOSIS — I480 Paroxysmal atrial fibrillation: Secondary | ICD-10-CM | POA: Diagnosis not present

## 2017-11-21 DIAGNOSIS — I2510 Atherosclerotic heart disease of native coronary artery without angina pectoris: Secondary | ICD-10-CM | POA: Diagnosis not present

## 2017-11-21 DIAGNOSIS — E7800 Pure hypercholesterolemia, unspecified: Secondary | ICD-10-CM | POA: Diagnosis not present

## 2017-11-21 NOTE — Patient Instructions (Signed)
Medication Instructions:  Your physician recommends that you continue on your current medications as directed. Please refer to the Current Medication list given to you today.  Labwork: -None  Testing/Procedures: -None  Follow-Up: Your physician wants you to follow-up in: 1 year with Dr. Crenshaw. You will receive a reminder letter in the mail two months in advance. If you don't receive a letter, please call our office to schedule the follow-up appointment.   Any Other Special Instructions Will Be Listed Below (If Applicable).     If you need a refill on your cardiac medications before your next appointment, please call your pharmacy.   

## 2017-12-04 ENCOUNTER — Other Ambulatory Visit: Payer: Self-pay | Admitting: Cardiology

## 2018-01-14 ENCOUNTER — Other Ambulatory Visit: Payer: Self-pay

## 2018-06-26 ENCOUNTER — Telehealth: Payer: Self-pay | Admitting: Cardiology

## 2018-06-26 MED ORDER — RIVAROXABAN 15 MG PO TABS: ORAL_TABLET | ORAL | 1 refills | Status: DC

## 2018-06-26 NOTE — Telephone Encounter (Signed)
Refilled Xarelto as requested by patient

## 2018-06-26 NOTE — Telephone Encounter (Signed)
New Message:   Patient calling because he is having some refill issue. Please call patient.

## 2019-03-03 NOTE — Progress Notes (Signed)
HPI: FU CAD and atrial fibrillation. Patient underwent coronary artery bypass and graft in 2007. He had a LIMA to the LAD, saphenous vein graft to first diagonal, sequential saphenous vein graft to the first marginal and second marginal, sequential saphenous vein graft to the PDA and posterior lateral. He also had closure of PFO. Abdominal ultrasound May 2014 showed no aneurysm. Had CVA in November of 2014. Transesophageal echocardiogram November 2014 showed normal LV function, mild prolapse of the posterior mitral valve leaflet and an atrial septal aneurysm with negative bubble study. Carotid Dopplers November 2014 showed less than 40% bilateral stenosis. 30 day event monitor showed atrial fibrillation. Echocardiogram March 2017 showed normal LV systolic function, grade 1 diastolic dysfunction, moderate left atrial enlargement and mild tricuspid regurgitation. Nuclear study May 2017 showed ejection fraction 53%.  There was diaphragmatic attenuation but no ischemia.  Since last seen, the patient denies any dyspnea on exertion, orthopnea, PND, pedal edema, palpitations, syncope or chest pain.   Current Outpatient Medications  Medication Sig Dispense Refill  . carvedilol (COREG) 3.125 MG tablet Take 1 tablet (3.125 mg total) by mouth 2 (two) times daily with a meal. 60 tablet 12  . Cholecalciferol (VITAMIN D3 PO) Take 1,000 Units by mouth daily.    . COD LIVER OIL PO Take 1 capsule by mouth at bedtime.     Marland Kitchen losartan (COZAAR) 50 MG tablet Take 50 mg by mouth daily.    . Multiple Vitamin (MULTIVITAMIN WITH MINERALS) TABS tablet Take 1 tablet by mouth daily.    Marland Kitchen omeprazole (PRILOSEC OTC) 20 MG tablet Take 20 mg by mouth daily.    . pravastatin (PRAVACHOL) 80 MG tablet Take 80 mg by mouth at bedtime.     . Rivaroxaban (XARELTO) 15 MG TABS tablet TAKE ONE (1) TABLET BY MOUTH EVERY DAY WITH SUPPER 90 tablet 1  . saw palmetto 160 MG capsule Take 160 mg by mouth 2 (two) times daily.      . tadalafil  (CIALIS) 5 MG tablet Take 5 mg by mouth at bedtime.     . TESTOSTERONE TD Place 1 mL onto the skin daily. Apply every morning to back shoulder - 10% lipoderm cream compounded at [employer]    . triamcinolone cream (KENALOG) 0.1 % Apply 1 application topically as directed.     No current facility-administered medications for this visit.      Past Medical History:  Diagnosis Date  . At risk for sleep apnea    STOP-BANG= 5   SENT TO PCP 05-20-2014  . Bilateral carotid artery stenosis    mild bilateral proximcal ICA  40% per duplex 11/ 2014  . Coronary artery disease    a. s/p CABG in 2007 w/ LIMA-LAD, SVG-D1, SVG-1st & 2nd Mrg, SVG-PDA and posterior lateral  . ED (erectile dysfunction)   . Eye infection 12/2014  . GERD (gastroesophageal reflux disease)   . History of embolic stroke no residual   11/ 2014  --  right PCA and MCA branch infarts (left side vision difficulties)  . Hypertension   . Other malaise and fatigue   . Paroxysmal atrial fibrillation (HCC)   . Pneumonia 10/2015   led to CHF, in hospital x 6 days  . S/P CABG x 6    2007  . Ureteral obstruction, right     Past Surgical History:  Procedure Laterality Date  . APPENDECTOMY  1950's  . CARDIAC CATHETERIZATION  10-31-2005  dr wall   severe  three vessel/  perserved LV  . CARDIOVASCULAR STRESS TEST  last one 05-19-2014  dr Jens Somcrenshaw   low risk lexiscan no exercise study/ small inferobasal infarct with no ischemia /  ef 53%  . CORONARY ARTERY BYPASS GRAFT  11-01-2005  dr Cornelius Morasowen   CLOSURE OF PFO/  LIMA to LAD,  SVG to D1,  SVG to 1st & 2nd  MARGINAL branches, SVG  to PDA and posterior lateral  . CYSTOSCOPY W/ URETERAL STENT PLACEMENT Right 05/21/2014   Procedure: CYSTOSCOPY WITH RETROGRADE PYELOGRAM/URETERAL STENT PLACEMENT;  Surgeon: Kathi LudwigSigmund I Tannenbaum, MD;  [facility];  Service: Urology;  Laterality: Right;  . CYSTOSCOPY WITH URETEROSCOPY Right 05/21/2014   Procedure: CYSTOSCOPY WITH  URETEROSCOPY;  Surgeon: Kathi LudwigSigmund I Tannenbaum, MD;  [facility] [place];  Service: Urology;  Laterality: Right;  . INGUINAL HERNIA REPAIR Bilateral   . LAPAROSCOPIC CHOLECYSTECTOMY  10-02-2000  . RIGHT URETER OBSTRUCTION SURGERY  age 85  . TEE WITHOUT CARDIOVERSION N/A 07/17/2013   Procedure: TRANSESOPHAGEAL ECHOCARDIOGRAM (TEE);  Surgeon: Wendall StadePeter C Nishan, MD;  [facility];  Service: Cardiovascular;  Laterality: N/A;  normal LV size, mild prolapse of posterior mitral valve leaflet,  mild MR and TR, normal AV,  no LAA thrombus,  atrial septal aneurysm with no PFO/ ASD and negative bubble study, normal RV, normal aorta with no debris    Social History   Socioeconomic History  . Marital status: Married    Spouse name: maxine  . Number of children: 4  . Years of education: college4  . Highest education level: Not on file  Occupational History  . Occupation: retired  Engineer, productionocial Needs  . Financial resource strain: Not on file  . Food insecurity    Worry: Not on file    Inability: Not on file  . Transportation needs    Medical: Not on file    Non-medical: Not on file  Tobacco Use  . Smoking status: Former Smoker    Packs/day: 1.00    Years: 35.00    Pack years: 35.00    Types: Cigarettes    Quit date: 08/29/1977    Years since quitting: 41.5  . Smokeless tobacco: Never Used  Substance and Sexual Activity  . Alcohol use: No    Alcohol/week: 0.0 standard drinks  . Drug use: No  . Sexual activity: Not on file  Lifestyle  . Physical activity    Days per week: Not on file    Minutes per session: Not on file  . Stress: Not on file  Relationships  . Social Musicianconnections    Talks on phone: Not on file    Gets together: Not on file    Attends religious service: Not on file    Active member of club or organization: Not on file    Attends meetings of clubs or organizations: Not on file    Relationship status: Not on file  . Intimate partner violence    Fear of  current or ex partner: Not on file    Emotionally abused: Not on file    Physically abused: Not on file    Forced sexual activity: Not on file  Other Topics Concern  . Not on file  Social History Narrative   Patient is married with 4 children.   Patient is right handed.   Patient has college education.   Patient drinks decaff coffee.    Family History  Problem Relation Age of Onset  . Heart disease Mother  PPM  . Hypertension Mother   . Lymphoma Father   . Prostate cancer Father   . Prostate cancer Brother   . Aneurysm Brother        REPAIR  . CAD Brother        CABG  . Other Brother        STENTS IN LEGS  . Other Sister        STOMACH ISSUES  . Depression Sister   . Lung cancer Brother        Asbestosis    ROS: no fevers or chills, productive cough, hemoptysis, dysphasia, odynophagia, melena, hematochezia, dysuria, hematuria, rash, seizure activity, orthopnea, PND, pedal edema, claudication. Remaining systems are negative.  Physical Exam: Well-developed well-nourished in no acute distress.  Skin is warm and dry.  HEENT is normal.  Neck is supple.  Chest is clear to auscultation with normal expansion.  Cardiovascular exam is regular rate and rhythm.  Abdominal exam nontender or distended. No masses palpated. Extremities show no edema. neuro grossly intact  A/P  1 coronary artery disease-patient denies chest pain.  Continue medical therapy with statin.  He is not on aspirin given need for Xarelto.  2 paroxysmal atrial fibrillation-he remains in sinus rhythm today.  Present medications including beta-blocker and Xarelto.  I will obtain most recent hemoglobin, potassium and creatinine from primary care.  3 chronic diastolic congestive heart failure-patient appears to be euvolemic today.  Continue fluid restriction and low-sodium diet.  4 hypertension-patient's blood pressure is controlled.  Continue present medications and follow.  5 hyperlipidemia-continue  statin.  Obtain most recent lipids and liver from primary care.  Olga MillersBrian Crenshaw, MD

## 2019-03-05 ENCOUNTER — Encounter: Payer: Self-pay | Admitting: Cardiology

## 2019-03-05 ENCOUNTER — Ambulatory Visit (INDEPENDENT_AMBULATORY_CARE_PROVIDER_SITE_OTHER): Payer: Medicare Other | Admitting: Cardiology

## 2019-03-05 VITALS — BP 130/80 | HR 48 | Ht 69.0 in | Wt 176.0 lb

## 2019-03-05 DIAGNOSIS — I2510 Atherosclerotic heart disease of native coronary artery without angina pectoris: Secondary | ICD-10-CM | POA: Diagnosis not present

## 2019-03-05 DIAGNOSIS — I480 Paroxysmal atrial fibrillation: Secondary | ICD-10-CM | POA: Diagnosis not present

## 2019-03-05 DIAGNOSIS — E7800 Pure hypercholesterolemia, unspecified: Secondary | ICD-10-CM

## 2019-03-05 DIAGNOSIS — I10 Essential (primary) hypertension: Secondary | ICD-10-CM

## 2019-03-05 NOTE — Patient Instructions (Signed)
Medication Instructions:  NO CHANGE If you need a refill on your cardiac medications before your next appointment, please call your pharmacy.   Lab work: If you have labs (blood work) drawn today and your tests are completely normal, you will receive your results only by: . MyChart Message (if you have MyChart) OR . A paper copy in the mail If you have any lab test that is abnormal or we need to change your treatment, we will call you to review the results.  Follow-Up: At [facility], you and your health needs are our priority.  As part of our continuing mission to provide you with exceptional heart care, we have created designated Provider Care Teams.  These Care Teams include your primary Cardiologist (physician) and Advanced Practice Providers (APPs -  Physician Assistants and Nurse Practitioners) who all work together to provide you with the care you need, when you need it. You will need a follow up appointment in 6 months.  Please call our office 2 months in advance to schedule this appointment.  You will see BRIAN CRENSHAW MD  

## 2019-03-19 ENCOUNTER — Other Ambulatory Visit: Payer: Self-pay

## 2019-03-19 ENCOUNTER — Ambulatory Visit (INDEPENDENT_AMBULATORY_CARE_PROVIDER_SITE_OTHER): Payer: Medicare Other | Admitting: Orthopaedic Surgery

## 2019-03-19 DIAGNOSIS — B351 Tinea unguium: Secondary | ICD-10-CM

## 2019-03-19 NOTE — Progress Notes (Signed)
Patient ID: Jose [name], male   DOB: 05/10/1931, 85 y.o.   MRN: [id number]  Patient comes in today to be evaluated for plantar wart and nail trimming.  We will refer patient to Dr. Sharol Given and his staff for this.  No charge visit.

## 2019-03-20 ENCOUNTER — Encounter: Payer: Self-pay | Admitting: Orthopedic Surgery

## 2019-03-20 ENCOUNTER — Ambulatory Visit (INDEPENDENT_AMBULATORY_CARE_PROVIDER_SITE_OTHER): Payer: Medicare Other | Admitting: Physician Assistant

## 2019-03-20 VITALS — Ht 69.0 in | Wt 176.0 lb

## 2019-03-20 DIAGNOSIS — B351 Tinea unguium: Secondary | ICD-10-CM

## 2019-03-20 DIAGNOSIS — L84 Corns and callosities: Secondary | ICD-10-CM

## 2019-03-20 DIAGNOSIS — I2510 Atherosclerotic heart disease of native coronary artery without angina pectoris: Secondary | ICD-10-CM

## 2019-03-20 NOTE — Progress Notes (Signed)
Office Visit Note   Patient: Jose BergamoHenry V [name]           Date of Birth: 1930/09/27           MRN: [id number] Visit Date: 03/20/2019              Requested by: Mosetta PuttBlomgren, Peter, MD [address] [place] [id number] PCP: Mosetta PuttBlomgren, Peter, MD  Chief Complaint  Patient presents with  . Left Foot - Follow-up  . Right Foot - Follow-up      HPI: The patient is a 85 yo gentleman who is seen today for onychomycosis of bilateral feet and what he thinks may be a plantar wart over the left foot.  The patient reports that he has been unable to care for his nails easily for some time now.  He normally has them trimmed elsewhere but presents for nail trimming today as he is unable to safely complete this at home.  He does have onychomycosis of the toenails.  He is chronically anticoagulated for paroxysmal atrial fib on Xarelto. He also reports pain under the ball of his foot which he is been attributing to having a plantar wart.  He does golf and notes pain when he is pushing off of his left foot when swinging a club.  Assessment & Plan: Visit Diagnoses:  1. Onychomycosis   2. Callus of foot     Plan: Toe nails trimmed x 10 as the patient is unable to safely perform at home.  Callus over the left foot 4th metatarsal plantar surface debrided with a #10 blade knife .  Recommended Amlactin or Lachydrin lotion nightly to help with dry skin and callus. Recommended stiff soled shoes. Demonstrated Achilles stretching exercises and instructed to do 5 times daily for a minute at a time.  Follow up in 6 weeks or sooner if difficulties in the interim.   Follow-Up Instructions: Return in about 6 weeks (around 05/01/2019).   Ortho Exam  Patient is alert, oriented, no adenopathy, well-dressed, normal affect, normal respiratory effort. The patient has intact pedal pulses bilaterally.  There are no signs of infection or cellulitis in either foot.  He does have callus over the fourth metatarsal  plantar head on the left foot and this was trimmed with a #10 blade knife and the patient tolerated this well.  He has dorsiflexion bilaterally to neutral and was instructed in some Achilles stretching exercises as he is likely overloading the ball of his foot.  This was demonstrated for the patient. Bilateral toenails were trimmed without difficulty as he is unable to safely perform this at home.Marland Kitchen.  He does have severe onychomycosis particularly of the great toes bilaterally.  Imaging: No results found. No images are attached to the encounter.  Labs: Lab Results  Component Value Date   HGBA1C 6.2 (H) 07/10/2013   ESRSEDRATE 12 01/13/2016   ESRSEDRATE 20 12/21/2015   ESRSEDRATE 94 (H) 11/26/2015   REPTSTATUS 11/24/2015 FINAL 11/24/2015   REPTSTATUS 11/28/2015 FINAL 11/24/2015   GRAMSTAIN  11/24/2015    ABUNDANT WBC PRESENT,BOTH PMN AND MONONUCLEAR FEW SQUAMOUS EPITHELIAL CELLS PRESENT MODERATE GRAM POSITIVE COCCI IN PAIRS MODERATE GRAM NEGATIVE RODS Performed at [facility]    CULT  11/24/2015    NORMAL OROPHARYNGEAL FLORA Performed at [facility]      Lab Results  Component Value Date   ALBUMIN 4.2 04/22/2014    Lab Results  Component Value Date   MG 1.6 (L) 11/22/2015  No results found for: VD25OH  No results found for: PREALBUMIN CBC EXTENDED Latest Ref Rng & Units 12/21/2015 11/25/2015 11/24/2015  WBC 3.8 - 10.8 K/uL 10.1 13.8(H) 13.5(H)  RBC 4.20 - 5.80 MIL/uL 4.74 4.36 4.18(L)  HGB 13.2 - 17.1 g/dL 16.114.3 09.613.0 12.3(L)  HCT 38.5 - 50.0 % 42.7 38.6(L) 37.1(L)  PLT 140 - 400 K/uL 164 196 192  NEUTROABS 1,500 - 7,800 cells/uL 9,090(H) - -  LYMPHSABS 850 - 3,900 cells/uL 808(L) - -     Body mass index is 25.99 kg/m.  Orders:  No orders of the defined types were placed in this encounter.  No orders of the defined types were placed in this encounter.    Procedures: No procedures performed  Clinical Data: No additional findings.  ROS:   All other systems negative, except as noted in the HPI. Review of Systems  Objective: Vital Signs: Ht 5\' 9"  (1.753 m)   Wt 176 lb (79.8 kg)   BMI 25.99 kg/m   Specialty Comments:  No specialty comments available.  PMFS History: Patient Active Problem List   Diagnosis Date Noted  . History of stroke 01/05/2016  . Chronic respiratory failure (HCC) 12/16/2015  . HSV-1 (herpes simplex virus 1) infection 12/16/2015  . Interstitial lung disease (HCC) 11/27/2015  . BOOP (bronchiolitis obliterans with organizing pneumonia) (HCC) 11/27/2015  . Elevated troponin 11/27/2015  . Hydronephrosis, right 11/27/2015  . SOB (shortness of breath)   . Acute respiratory failure with hypoxia (HCC) 11/26/2015  . Chronic kidney disease (CKD), stage III (moderate) (HCC) 11/26/2015  . Acute on chronic diastolic (congestive) heart failure (HCC)   . Lobar pneumonia due to unspecified organism   . Acute on chronic combined systolic (congestive) and diastolic (congestive) heart failure (HCC) 11/22/2015  . Atrial fibrillation (HCC) 09/10/2013  . 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 Visual field loss 07/09/2013  . HYPERLIPIDEMIA-MIXED 12/23/2009  . CAD, NATIVE VESSEL 12/15/2009  . ERECTILE DYSFUNCTION 12/10/2008  . Essential hypertension 12/10/2008  . FATIGUE 12/10/2008   Past Medical History:  Diagnosis Date  . At risk for sleep apnea    STOP-BANG= 5   SENT TO PCP 05-20-2014  . Bilateral carotid artery stenosis    mild bilateral proximcal ICA  40% per duplex 11/ 2014  . Coronary artery disease    a. s/p CABG in 2007 w/ LIMA-LAD, SVG-D1, SVG-1st & 2nd Mrg, SVG-PDA and posterior lateral  . ED (erectile dysfunction)   . Eye infection 12/2014  . GERD (gastroesophageal reflux disease)   . History of embolic stroke no residual   04/11/ 2014  --  right PCA and MCA branch infarts (left side vision difficulties)  . Hypertension   . Other malaise and fatigue    . Paroxysmal atrial fibrillation (HCC)   . Pneumonia 10/2015   led to CHF, in hospital x 6 days  . S/P CABG x 6    2007  . Ureteral obstruction, right     Family History  Problem Relation Age of Onset  . Heart disease Mother        PPM  . Hypertension Mother   . Lymphoma Father   . Prostate cancer Father   . Prostate cancer Brother   . Aneurysm Brother        REPAIR  . CAD Brother        CABG  . Other Brother        STENTS IN LEGS  . Other  Sister        STOMACH ISSUES  . Depression Sister   . Lung cancer Brother        Asbestosis    Past Surgical History:  Procedure Laterality Date  . APPENDECTOMY  1950's  . CARDIAC CATHETERIZATION  10-31-2005  dr wall   severe three vessel/  perserved LV  . CARDIOVASCULAR STRESS TEST  last one 05-19-2014  dr Stanford Breed   low risk lexiscan no exercise study/ small inferobasal infarct with no ischemia /  ef 53%  . CORONARY ARTERY BYPASS GRAFT  11-01-2005  dr Roxy Manns   CLOSURE OF PFO/  LIMA to LAD,  SVG to D1,  SVG to 1st & 2nd  MARGINAL branches, SVG  to PDA and posterior lateral  . CYSTOSCOPY W/ URETERAL STENT PLACEMENT Right 05/21/2014   Procedure: CYSTOSCOPY WITH RETROGRADE PYELOGRAM/URETERAL STENT PLACEMENT;  Surgeon: Ailene Rud, MD;  Location: [facility];  Service: Urology;  Laterality: Right;  . CYSTOSCOPY WITH URETEROSCOPY Right 05/21/2014   Procedure: CYSTOSCOPY WITH URETEROSCOPY;  Surgeon: Ailene Rud, MD;  [facility];  Service: Urology;  Laterality: Right;  . INGUINAL HERNIA REPAIR Bilateral   . LAPAROSCOPIC CHOLECYSTECTOMY  10-02-2000  . RIGHT URETER OBSTRUCTION SURGERY  age 81  . TEE WITHOUT CARDIOVERSION N/A 07/17/2013   Procedure: TRANSESOPHAGEAL ECHOCARDIOGRAM (TEE);  Surgeon: Josue Hector, MD;  Location: [facility];  Service: Cardiovascular;  Laterality: N/A;  normal LV size, mild prolapse of posterior mitral valve leaflet,  mild MR and TR, normal AV,  no LAA  thrombus,  atrial septal aneurysm with no PFO/ ASD and negative bubble study, normal RV, normal aorta with no debris   Social History   Occupational History  . Occupation: retired  Tobacco Use  . Smoking status: Former Smoker    Packs/day: 1.00    Years: 35.00    Pack years: 35.00    Types: Cigarettes    Quit date: 08/29/1977    Years since quitting: 41.5  . Smokeless tobacco: Never Used  Substance and Sexual Activity  . Alcohol use: No    Alcohol/week: 0.0 standard drinks  . Drug use: No  . Sexual activity: Not on file

## 2019-04-21 ENCOUNTER — Other Ambulatory Visit: Payer: Self-pay | Admitting: Cardiology

## 2019-04-21 NOTE — Telephone Encounter (Signed)
Pt is a 85 year old male who saw Dr. Stanford Breed on 03/05/19. Last noted weight was 79.8Kg on 03/20/19. SCr on 02/20/19 was 1.63. CrCl is 46mL/min. Will refill Xarelto 15mg  QD.

## 2019-05-01 ENCOUNTER — Encounter: Payer: Self-pay | Admitting: Orthopedic Surgery

## 2019-05-01 ENCOUNTER — Ambulatory Visit (INDEPENDENT_AMBULATORY_CARE_PROVIDER_SITE_OTHER): Payer: Medicare Other | Admitting: Orthopedic Surgery

## 2019-05-01 VITALS — Ht 69.0 in | Wt 176.0 lb

## 2019-05-01 DIAGNOSIS — I2510 Atherosclerotic heart disease of native coronary artery without angina pectoris: Secondary | ICD-10-CM | POA: Diagnosis not present

## 2019-05-01 DIAGNOSIS — L97521 Non-pressure chronic ulcer of other part of left foot limited to breakdown of skin: Secondary | ICD-10-CM | POA: Diagnosis not present

## 2019-05-01 DIAGNOSIS — B351 Tinea unguium: Secondary | ICD-10-CM | POA: Diagnosis not present

## 2019-05-02 ENCOUNTER — Encounter: Payer: Self-pay | Admitting: Orthopedic Surgery

## 2019-05-02 NOTE — Progress Notes (Signed)
Office Visit Note   Patient: Jose [name]           Date of Birth: [birth year]           MRN: [id number] Visit Date: 05/01/2019              Requested by: Derinda Late, MD [address] [place],  [place] [address] PCP: Derinda Late, MD  Chief Complaint  Patient presents with  . Right Foot - Follow-up  . Left Foot - Follow-up      HPI: Patient is a 85 year old gentleman who presents in follow-up for both feet he complains of painful onychomycotic nails as well as a Wegner grade 1 ulcer beneath the fourth metatarsal head left foot.  Assessment & Plan: Visit Diagnoses:  1. Onychomycosis   2. Non-pressure chronic ulcer of other part of left foot limited to breakdown of skin (HCC)     Plan: Nails were trimmed x10 ulcer debrided x1.  Follow-Up Instructions: Return in about 3 months (around 07/31/2019).   Ortho Exam  Patient is alert, oriented, no adenopathy, well-dressed, normal affect, normal respiratory effort. Examination patient has good pulses he has thickened discolored onychomycotic nails but no signs of infection.  Patient is unable to safely trim the nails on his own and the nails were trimmed G26 without complications.  He has a Medical illustrator grade 1 ulcer beneath the fourth metatarsal head left foot.  After informed consent a 10 blade knife was used to debride the skin and soft tissue back to healthy viable tissue the ulcer is 10 mm in diameter 1 mm deep.  Imaging: No results found. No images are attached to the encounter.  Labs: Lab Results  Component Value Date   HGBA1C 6.2 (H) 07/10/2013   ESRSEDRATE 12 01/13/2016   ESRSEDRATE 20 12/21/2015   ESRSEDRATE 94 (H) 11/26/2015   REPTSTATUS 11/24/2015 FINAL 11/24/2015   REPTSTATUS 11/28/2015 FINAL 11/24/2015   GRAMSTAIN  11/24/2015    ABUNDANT WBC PRESENT,BOTH PMN AND MONONUCLEAR FEW SQUAMOUS EPITHELIAL CELLS PRESENT MODERATE GRAM POSITIVE COCCI IN PAIRS MODERATE GRAM NEGATIVE RODS Performed at [facility]  11/24/2015    NORMAL OROPHARYNGEAL FLORA Performed at [facility]      Lab Results  Component Value Date   ALBUMIN 4.2 04/22/2014    Lab Results  Component Value Date   MG 1.6 (L) 11/22/2015   No results found for: VD25OH  No results found for: PREALBUMIN CBC EXTENDED Latest Ref Rng & Units 12/21/2015 11/25/2015 11/24/2015  WBC 3.8 - 10.8 K/uL 10.1 13.8(H) 13.5(H)  RBC 4.20 - 5.80 MIL/uL 4.74 4.36 4.18(L)  HGB 13.2 - 17.1 g/dL 14.3 13.0 12.3(L)  HCT 38.5 - 50.0 % 42.7 38.6(L) 37.1(L)  PLT 140 - 400 K/uL 164 196 192  NEUTROABS 1,500 - 7,800 cells/uL 9,090(H) - -  LYMPHSABS 850 - 3,900 cells/uL 808(L) - -     Body mass index is 25.99 kg/m.  Orders:  No orders of the defined types were placed in this encounter.  No orders of the defined types were placed in this encounter.    Procedures: No procedures performed  Clinical Data: No additional findings.  ROS:  All other systems negative, except as noted in the HPI. Review of Systems  Objective: Vital Signs: Ht 5\' 9"  (1.753 m)   Wt 176 lb (79.8 kg)   BMI 25.99 kg/m   Specialty Comments:  No specialty comments available.  PMFS History: Patient Active Problem  List   Diagnosis Date Noted  . History of stroke 01/05/2016  . Chronic respiratory failure (HCC) 12/16/2015  . HSV-1 (herpes simplex virus 1) infection 12/16/2015  . Interstitial lung disease (HCC) 11/27/2015  . BOOP (bronchiolitis obliterans with organizing pneumonia) (HCC) 11/27/2015  . Elevated troponin 11/27/2015  . Hydronephrosis, right 11/27/2015  . SOB (shortness of breath)   . Acute respiratory failure with hypoxia (HCC) 11/26/2015  . Chronic kidney disease (CKD), stage III (moderate) (HCC) 11/26/2015  . Acute on chronic diastolic (congestive) heart failure (HCC)   . Lobar pneumonia due to unspecified organism   . Acute on chronic combined systolic (congestive) and diastolic (congestive) heart failure (HCC)  11/22/2015  . Atrial fibrillation (HCC) 09/10/2013  . 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 Visual field loss 07/09/2013  . HYPERLIPIDEMIA-MIXED 12/23/2009  . CAD, NATIVE VESSEL 12/15/2009  . ERECTILE DYSFUNCTION 12/10/2008  . Essential hypertension 12/10/2008  . FATIGUE 12/10/2008   Past Medical History:  Diagnosis Date  . At risk for sleep apnea    STOP-BANG= 5   SENT TO PCP 05-20-2014  . Bilateral carotid artery stenosis    mild bilateral proximcal ICA  40% per duplex 11/ 2014  . Coronary artery disease    a. s/p CABG in 2007 w/ LIMA-LAD, SVG-D1, SVG-1st & 2nd Mrg, SVG-PDA and posterior lateral  . ED (erectile dysfunction)   . Eye infection 12/2014  . GERD (gastroesophageal reflux disease)   . History of embolic stroke no residual   16/11/ 2014  --  right PCA and MCA branch infarts (left side vision difficulties)  . Hypertension   . Other malaise and fatigue   . Paroxysmal atrial fibrillation (HCC)   . Pneumonia 10/2015   led to CHF, in hospital x 6 days  . S/P CABG x 6    2007  . Ureteral obstruction, right     Family History  Problem Relation Age of Onset  . Heart disease Mother        PPM  . Hypertension Mother   . Lymphoma Father   . Prostate cancer Father   . Prostate cancer Brother   . Aneurysm Brother        REPAIR  . CAD Brother        CABG  . Other Brother        STENTS IN LEGS  . Other Sister        STOMACH ISSUES  . Depression Sister   . Lung cancer Brother        Asbestosis    Past Surgical History:  Procedure Laterality Date  . APPENDECTOMY  1950's  . CARDIAC CATHETERIZATION  10-31-2005  dr wall   severe three vessel/  perserved LV  . CARDIOVASCULAR STRESS TEST  last one 05-19-2014  dr Jens Somcrenshaw   low risk lexiscan no exercise study/ small inferobasal infarct with no ischemia /  ef 53%  . CORONARY ARTERY BYPASS GRAFT  11-01-2005  dr Cornelius Morasowen   CLOSURE OF PFO/  LIMA to LAD,  SVG to D1,  SVG to 1st & 2nd   MARGINAL branches, SVG  to PDA and posterior lateral  . CYSTOSCOPY W/ URETERAL STENT PLACEMENT Right 05/21/2014   Procedure: CYSTOSCOPY WITH RETROGRADE PYELOGRAM/URETERAL STENT PLACEMENT;  Surgeon: Kathi LudwigSigmund I Tannenbaum, MD;  [facility];  Service: Urology;  Laterality: Right;  . CYSTOSCOPY WITH URETEROSCOPY Right 05/21/2014   Procedure: CYSTOSCOPY WITH URETEROSCOPY;  Surgeon: Kathi LudwigSigmund I Tannenbaum, MD;  Location: [facility];  Service: Urology;  Laterality: Right;  . INGUINAL HERNIA REPAIR Bilateral   . LAPAROSCOPIC CHOLECYSTECTOMY  10-02-2000  . RIGHT URETER OBSTRUCTION SURGERY  age 85  . TEE WITHOUT CARDIOVERSION N/A 07/17/2013   Procedure: TRANSESOPHAGEAL ECHOCARDIOGRAM (TEE);  Surgeon: Wendall Stade, MD;  Location: [facility];  Service: Cardiovascular;  Laterality: N/A;  normal LV size, mild prolapse of posterior mitral valve leaflet,  mild MR and TR, normal AV,  no LAA thrombus,  atrial septal aneurysm with no PFO/ ASD and negative bubble study, normal RV, normal aorta with no debris   Social History   Occupational History  . Occupation: retired  Tobacco Use  . Smoking status: Former Smoker    Packs/day: 1.00    Years: 35.00    Pack years: 35.00    Types: Cigarettes    Quit date: 08/29/1977    Years since quitting: 41.7  . Smokeless tobacco: Never Used  Substance and Sexual Activity  . Alcohol use: No    Alcohol/week: 0.0 standard drinks  . Drug use: No  . Sexual activity: Not on file

## 2019-08-04 ENCOUNTER — Ambulatory Visit (INDEPENDENT_AMBULATORY_CARE_PROVIDER_SITE_OTHER): Payer: Medicare Other | Admitting: Orthopedic Surgery

## 2019-08-04 ENCOUNTER — Other Ambulatory Visit: Payer: Self-pay

## 2019-08-04 ENCOUNTER — Encounter: Payer: Self-pay | Admitting: Orthopedic Surgery

## 2019-08-04 VITALS — Ht 69.0 in | Wt 176.0 lb

## 2019-08-04 DIAGNOSIS — L97521 Non-pressure chronic ulcer of other part of left foot limited to breakdown of skin: Secondary | ICD-10-CM | POA: Diagnosis not present

## 2019-08-04 DIAGNOSIS — L84 Corns and callosities: Secondary | ICD-10-CM

## 2019-08-04 DIAGNOSIS — B351 Tinea unguium: Secondary | ICD-10-CM | POA: Diagnosis not present

## 2019-08-04 DIAGNOSIS — I2510 Atherosclerotic heart disease of native coronary artery without angina pectoris: Secondary | ICD-10-CM

## 2019-08-04 NOTE — Progress Notes (Signed)
Office Visit Note   Patient: Jose [name]           Date of Birth: 03-Nov-1930           MRN: [id number] Visit Date: 08/04/2019              Requested by: Derinda Late, MD [address] [place],  [place] [address] PCP: Derinda Late, MD  Chief Complaint  Patient presents with  . Right Foot - Follow-up  . Left Foot - Follow-up      HPI: Patient is a 85 year old gentleman who presents in follow-up for painful onychomycotic nails x10 on both feet.  Patient states that the hyperkeratotic lesion the repaired last visit has resolved and he has no pain or problems on the plantar aspect of both feet.  Assessment & Plan: Visit Diagnoses:  1. Onychomycosis   2. Non-pressure chronic ulcer of other part of left foot limited to breakdown of skin (HCC)   3. Callus of foot     Plan: Nails were trimmed V89 without complications reevaluate in 3 months.  Follow-Up Instructions: Return in about 3 months (around 11/02/2019).   Ortho Exam  Patient is alert, oriented, no adenopathy, well-dressed, normal affect, normal respiratory effort. Examination the hyperkeratotic lesion on the plantar aspect of his foot has resolved he has good pulses there are no plantar ulcers or calluses.  Patient has thickened discolored onychomycotic nails x10 he is unable to safely trim the nails on his own and nails were trimmed F81 without complications.  There were no paronychial infections.  Imaging: No results found. No images are attached to the encounter.  Labs: Lab Results  Component Value Date   HGBA1C 6.2 (H) 07/10/2013   ESRSEDRATE 12 01/13/2016   ESRSEDRATE 20 12/21/2015   ESRSEDRATE 94 (H) 11/26/2015   REPTSTATUS 11/24/2015 FINAL 11/24/2015   REPTSTATUS 11/28/2015 FINAL 11/24/2015   GRAMSTAIN  11/24/2015    ABUNDANT WBC PRESENT,BOTH PMN AND MONONUCLEAR FEW SQUAMOUS EPITHELIAL CELLS PRESENT MODERATE GRAM POSITIVE COCCI IN PAIRS MODERATE GRAM NEGATIVE RODS Performed at [facility]  11/24/2015    NORMAL OROPHARYNGEAL FLORA Performed at [facility]      Lab Results  Component Value Date   ALBUMIN 4.2 04/22/2014    Lab Results  Component Value Date   MG 1.6 (L) 11/22/2015   No results found for: VD25OH  No results found for: PREALBUMIN CBC EXTENDED Latest Ref Rng & Units 12/21/2015 11/25/2015 11/24/2015  WBC 3.8 - 10.8 K/uL 10.1 13.8(H) 13.5(H)  RBC 4.20 - 5.80 MIL/uL 4.74 4.36 4.18(L)  HGB 13.2 - 17.1 g/dL 14.3 13.0 12.3(L)  HCT 38.5 - 50.0 % 42.7 38.6(L) 37.1(L)  PLT 140 - 400 K/uL 164 196 192  NEUTROABS 1,500 - 7,800 cells/uL 9,090(H) - -  LYMPHSABS 850 - 3,900 cells/uL 808(L) - -     Body mass index is 25.99 kg/m.  Orders:  No orders of the defined types were placed in this encounter.  No orders of the defined types were placed in this encounter.    Procedures: No procedures performed  Clinical Data: No additional findings.  ROS:  All other systems negative, except as noted in the HPI. Review of Systems  Objective: Vital Signs: Ht 5\' 9"  (1.753 m)   Wt 176 lb (79.8 kg)   BMI 25.99 kg/m   Specialty Comments:  No specialty comments available.  PMFS History: Patient Active Problem List   Diagnosis Date Noted  .  History of stroke 01/05/2016  . Chronic respiratory failure (HCC) 12/16/2015  . HSV-1 (herpes simplex virus 1) infection 12/16/2015  . Interstitial lung disease (HCC) 11/27/2015  . BOOP (bronchiolitis obliterans with organizing pneumonia) (HCC) 11/27/2015  . Elevated troponin 11/27/2015  . Hydronephrosis, right 11/27/2015  . SOB (shortness of breath)   . Acute respiratory failure with hypoxia (HCC) 11/26/2015  . Chronic kidney disease (CKD), stage III (moderate) 11/26/2015  . Acute on chronic diastolic (congestive) heart failure (HCC)   . Lobar pneumonia due to unspecified organism   . Acute on chronic combined systolic (congestive) and diastolic (congestive) heart failure (HCC) 11/22/2015   . Atrial fibrillation (HCC) 09/10/2013  . 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 Visual field loss 07/09/2013  . HYPERLIPIDEMIA-MIXED 12/23/2009  . CAD, NATIVE VESSEL 12/15/2009  . ERECTILE DYSFUNCTION 12/10/2008  . Essential hypertension 12/10/2008  . FATIGUE 12/10/2008   Past Medical History:  Diagnosis Date  . At risk for sleep apnea    STOP-BANG= 5   SENT TO PCP 05-20-2014  . Bilateral carotid artery stenosis    mild bilateral proximcal ICA  40% per duplex 11/ 2014  . Coronary artery disease    a. s/p CABG in 2007 w/ LIMA-LAD, SVG-D1, SVG-1st & 2nd Mrg, SVG-PDA and posterior lateral  . ED (erectile dysfunction)   . Eye infection 12/2014  . GERD (gastroesophageal reflux disease)   . History of embolic stroke no residual   16/ 2014  --  right PCA and MCA branch infarts (left side vision difficulties)  . Hypertension   . Other malaise and fatigue   . Paroxysmal atrial fibrillation (HCC)   . Pneumonia 10/2015   led to CHF, in hospital x 6 days  . S/P CABG x 6    2007  . Ureteral obstruction, right     Family History  Problem Relation Age of Onset  . Heart disease Mother        PPM  . Hypertension Mother   . Lymphoma Father   . Prostate cancer Father   . Prostate cancer Brother   . Aneurysm Brother        REPAIR  . CAD Brother        CABG  . Other Brother        STENTS IN LEGS  . Other Sister        STOMACH ISSUES  . Depression Sister   . Lung cancer Brother        Asbestosis    Past Surgical History:  Procedure Laterality Date  . APPENDECTOMY  1950's  . CARDIAC CATHETERIZATION  10-31-2005  dr wall   severe three vessel/  perserved LV  . CARDIOVASCULAR STRESS TEST  last one 05-19-2014  dr Jens Som   low risk lexiscan no exercise study/ small inferobasal infarct with no ischemia /  ef 53%  . CORONARY ARTERY BYPASS GRAFT  11-01-2005  dr Cornelius Moras   CLOSURE OF PFO/  LIMA to LAD,  SVG to D1,  SVG to 1st & 2nd  MARGINAL  branches, SVG  to PDA and posterior lateral  . CYSTOSCOPY W/ URETERAL STENT PLACEMENT Right 05/21/2014   Procedure: CYSTOSCOPY WITH RETROGRADE PYELOGRAM/URETERAL STENT PLACEMENT;  Surgeon: Kathi Ludwig, MD;  Location: [facility];  Service: Urology;  Laterality: Right;  . CYSTOSCOPY WITH URETEROSCOPY Right 05/21/2014   Procedure: CYSTOSCOPY WITH URETEROSCOPY;  Surgeon: Kathi Ludwig, MD;  Location: [facility];  Service: Urology;  Laterality: Right;  . INGUINAL HERNIA REPAIR Bilateral   . LAPAROSCOPIC CHOLECYSTECTOMY  10-02-2000  . RIGHT URETER OBSTRUCTION SURGERY  age 89  . TEE WITHOUT CARDIOVERSION N/A 07/17/2013   Procedure: TRANSESOPHAGEAL ECHOCARDIOGRAM (TEE);  Surgeon: Wendall StadePeter C Nishan, MD;  [facility];  Service: Cardiovascular;  Laterality: N/A;  normal LV size, mild prolapse of posterior mitral valve leaflet,  mild MR and TR, normal AV,  no LAA thrombus,  atrial septal aneurysm with no PFO/ ASD and negative bubble study, normal RV, normal aorta with no debris   Social History   Occupational History  . Occupation: retired  Tobacco Use  . Smoking status: Former Smoker    Packs/day: 1.00    Years: 35.00    Pack years: 35.00    Types: Cigarettes    Quit date: 08/29/1977    Years since quitting: 41.9  . Smokeless tobacco: Never Used  Substance and Sexual Activity  . Alcohol use: No    Alcohol/week: 0.0 standard drinks  . Drug use: No  . Sexual activity: Not on file

## 2019-10-20 NOTE — Progress Notes (Signed)
HPI: FU CAD and atrial fibrillation. Patient underwent coronary artery bypass and graft in 2007. He had a LIMA to the LAD, saphenous vein graft to first diagonal, sequential saphenous vein graft to the first marginal and second marginal, sequential saphenous vein graft to the PDA and posterior lateral. He also had closure of PFO. Abdominal ultrasound May 2014 showed no aneurysm. Had CVA in November of 2014. Transesophageal echocardiogram November 2014 showed normal LV function, mild prolapse of the posterior mitral valve leaflet and an atrial septal aneurysm with negative bubble study. Carotid Dopplers November 2014 showed less than 40% bilateral stenosis. 30 day event monitor showed atrial fibrillation. Echocardiogram March 2017 showed normal LV systolic function, grade 1 diastolic dysfunction, moderate left atrial enlargement and mild tricuspid regurgitation. Nuclear study May 2017 showed ejection fraction 53%. There was diaphragmatic attenuation but no ischemia. Since last seen,there is no dyspnea, chest pain, palpitations or syncope.  No bleeding.  Current Outpatient Medications  Medication Sig Dispense Refill  . carvedilol (COREG) 3.125 MG tablet Take 1 tablet (3.125 mg total) by mouth 2 (two) times daily with a meal. 60 tablet 12  . Cholecalciferol (VITAMIN D3 PO) Take 1,000 Units by mouth daily.    . COD LIVER OIL PO Take 1 capsule by mouth at bedtime.     Marland Kitchen losartan (COZAAR) 50 MG tablet Take 50 mg by mouth daily.    . Multiple Vitamin (MULTIVITAMIN WITH MINERALS) TABS tablet Take 1 tablet by mouth daily.    Marland Kitchen omeprazole (PRILOSEC OTC) 20 MG tablet Take 20 mg by mouth every other day.     . pravastatin (PRAVACHOL) 80 MG tablet Take 80 mg by mouth at bedtime.     . saw palmetto 160 MG capsule Take 160 mg by mouth 2 (two) times daily.      . tadalafil (CIALIS) 5 MG tablet Take 5 mg by mouth at bedtime.     . TESTOSTERONE TD Place 1 mL onto the skin daily. Apply every morning to back  shoulder - 10% lipoderm cream compounded at [employer]    . triamcinolone cream (KENALOG) 0.1 % Apply 1 application topically as directed.    Alveda Reasons 15 MG TABS tablet TAKE ONE (1) TABLET BY MOUTH EVERY DAY WITH SUPPER 90 tablet 2   No current facility-administered medications for this visit.     Past Medical History:  Diagnosis Date  . At risk for sleep apnea    STOP-BANG= 5   SENT TO PCP 05-20-2014  . Bilateral carotid artery stenosis    mild bilateral proximcal ICA  40% per duplex 11/ 2014  . Coronary artery disease    a. s/p CABG in 2007 w/ LIMA-LAD, SVG-D1, SVG-1st & 2nd Mrg, SVG-PDA and posterior lateral  . ED (erectile dysfunction)   . Eye infection 12/2014  . GERD (gastroesophageal reflux disease)   . History of embolic stroke no residual   11/ 2014  --  right PCA and MCA branch infarts (left side vision difficulties)  . Hypertension   . Other malaise and fatigue   . Paroxysmal atrial fibrillation (HCC)   . Pneumonia 10/2015   led to CHF, in hospital x 6 days  . S/P CABG x 6    2007  . Ureteral obstruction, right     Past Surgical History:  Procedure Laterality Date  . APPENDECTOMY  1950's  . CARDIAC CATHETERIZATION  10-31-2005  dr wall   severe three vessel/  perserved LV  .  CARDIOVASCULAR STRESS TEST  last one 05-19-2014  dr Jens Som   low risk lexiscan no exercise study/ small inferobasal infarct with no ischemia /  ef 53%  . CORONARY ARTERY BYPASS GRAFT  11-01-2005  dr Cornelius Moras   CLOSURE OF PFO/  LIMA to LAD,  SVG to D1,  SVG to 1st & 2nd  MARGINAL branches, SVG  to PDA and posterior lateral  . CYSTOSCOPY W/ URETERAL STENT PLACEMENT Right 05/21/2014   Procedure: CYSTOSCOPY WITH RETROGRADE PYELOGRAM/URETERAL STENT PLACEMENT;  Surgeon: Kathi Ludwig, MD;  [facility];  Service: Urology;  Laterality: Right;  . CYSTOSCOPY WITH URETEROSCOPY Right 05/21/2014   Procedure: CYSTOSCOPY WITH URETEROSCOPY;  Surgeon: Kathi Ludwig,  MD;  Location: [facility];  Service: Urology;  Laterality: Right;  . INGUINAL HERNIA REPAIR Bilateral   . LAPAROSCOPIC CHOLECYSTECTOMY  10-02-2000  . RIGHT URETER OBSTRUCTION SURGERY  age 75  . TEE WITHOUT CARDIOVERSION N/A 07/17/2013   Procedure: TRANSESOPHAGEAL ECHOCARDIOGRAM (TEE);  Surgeon: Wendall Stade, MD;  Location: [facility]OSCOPY;  Service: Cardiovascular;  Laterality: N/A;  normal LV size, mild prolapse of posterior mitral valve leaflet,  mild MR and TR, normal AV,  no LAA thrombus,  atrial septal aneurysm with no PFO/ ASD and negative bubble study, normal RV, normal aorta with no debris    Social History   Socioeconomic History  . Marital status: Married    Spouse name: maxine  . Number of children: 4  . Years of education: college4  . Highest education level: Not on file  Occupational History  . Occupation: retired  Tobacco Use  . Smoking status: Former Smoker    Packs/day: 1.00    Years: 35.00    Pack years: 35.00    Types: Cigarettes    Quit date: 08/29/1977    Years since quitting: 42.1  . Smokeless tobacco: Never Used  Substance and Sexual Activity  . Alcohol use: No    Alcohol/week: 0.0 standard drinks  . Drug use: No  . Sexual activity: Not on file  Other Topics Concern  . Not on file  Social History Narrative   Patient is married with 4 children.   Patient is right handed.   Patient has college education.   Patient drinks decaff coffee.   Social Determinants of Health   Financial Resource Strain:   . Difficulty of Paying Living Expenses: Not on file  Food Insecurity:   . Worried About Programme researcher, broadcasting/film/video in the Last Year: Not on file  . Ran Out of Food in the Last Year: Not on file  Transportation Needs:   . Lack of Transportation (Medical): Not on file  . Lack of Transportation (Non-Medical): Not on file  Physical Activity:   . Days of Exercise per Week: Not on file  . Minutes of Exercise per Session: Not on file  Stress:   .  Feeling of Stress : Not on file  Social Connections:   . Frequency of Communication with Friends and Family: Not on file  . Frequency of Social Gatherings with Friends and Family: Not on file  . Attends Religious Services: Not on file  . Active Member of Clubs or Organizations: Not on file  . Attends Banker Meetings: Not on file  . Marital Status: Not on file  Intimate Partner Violence:   . Fear of Current or Ex-Partner: Not on file  . Emotionally Abused: Not on file  . Physically Abused: Not on file  .  Sexually Abused: Not on file    Family History  Problem Relation Age of Onset  . Heart disease Mother        PPM  . Hypertension Mother   . Lymphoma Father   . Prostate cancer Father   . Prostate cancer Brother   . Aneurysm Brother        REPAIR  . CAD Brother        CABG  . Other Brother        STENTS IN LEGS  . Other Sister        STOMACH ISSUES  . Depression Sister   . Lung cancer Brother        Asbestosis    ROS: no fevers or chills, productive cough, hemoptysis, dysphasia, odynophagia, melena, hematochezia, dysuria, hematuria, rash, seizure activity, orthopnea, PND, pedal edema, claudication. Remaining systems are negative.  Physical Exam: Well-developed well-nourished in no acute distress.  Skin is warm and dry.  HEENT is normal.  Neck is supple.  Chest is clear to auscultation with normal expansion.  Cardiovascular exam is regular rate and rhythm.  Abdominal exam nontender or distended. No masses palpated. Extremities show no edema. neuro grossly intact  ECG-sinus bradycardia at a rate of 57, PVC, no ST changes.  Personally reviewed  A/P  1 coronary artery disease-patient denies chest pain.  Continue statin.  He is not on aspirin given need for anticoagulation.  2 paroxysmal atrial fibrillation-patient is in sinus rhythm today.  Plan to continue Xarelto and beta-blocker.  Check hemoglobin and renal function.  3 hypertension-blood pressure  controlled.  Continue present medical regimen.  4 hyperlipidemia-continue statin.  Check lipids and liver.  5 Chronic diastolic congestive heart failure-patient doing well from a volume standpoint.  Continue fluid restriction and low-sodium diet.  Olga Millers, MD

## 2019-10-22 ENCOUNTER — Encounter: Payer: Self-pay | Admitting: Cardiology

## 2019-10-22 ENCOUNTER — Other Ambulatory Visit: Payer: Self-pay

## 2019-10-22 ENCOUNTER — Ambulatory Visit (INDEPENDENT_AMBULATORY_CARE_PROVIDER_SITE_OTHER): Payer: Medicare Other | Admitting: Cardiology

## 2019-10-22 VITALS — BP 118/68 | HR 48 | Ht 69.0 in | Wt 180.1 lb

## 2019-10-22 DIAGNOSIS — I2510 Atherosclerotic heart disease of native coronary artery without angina pectoris: Secondary | ICD-10-CM

## 2019-10-22 DIAGNOSIS — I480 Paroxysmal atrial fibrillation: Secondary | ICD-10-CM

## 2019-10-22 DIAGNOSIS — E7800 Pure hypercholesterolemia, unspecified: Secondary | ICD-10-CM

## 2019-10-22 DIAGNOSIS — I10 Essential (primary) hypertension: Secondary | ICD-10-CM | POA: Diagnosis not present

## 2019-10-22 NOTE — Patient Instructions (Signed)
Medication Instructions:  NO CHANGE *If you need a refill on your cardiac medications before your next appointment, please call your pharmacy*  Lab Work: Your physician recommends that you HAVE LAB WORK TODAY If you have labs (blood work) drawn today and your tests are completely normal, you will receive your results only by: . MyChart Message (if you have MyChart) OR . A paper copy in the mail If you have any lab test that is abnormal or we need to change your treatment, we will call you to review the results.  Follow-Up: At [facility], you and your health needs are our priority.  As part of our continuing mission to provide you with exceptional heart care, we have created designated Provider Care Teams.  These Care Teams include your primary Cardiologist (physician) and Advanced Practice Providers (APPs -  Physician Assistants and Nurse Practitioners) who all work together to provide you with the care you need, when you need it.  Your next appointment:   6 month(s)  The format for your next appointment:   Either In Person or Virtual  Provider:   Brian Crenshaw, MD   

## 2019-10-23 LAB — CBC
Hematocrit: 47.2 % (ref 37.5–51.0)
Hemoglobin: 16.2 g/dL (ref 13.0–17.7)
MCH: 31.2 pg (ref 26.6–33.0)
MCHC: 34.3 g/dL (ref 31.5–35.7)
MCV: 91 fL (ref 79–97)
Platelets: 113 10*3/uL — ABNORMAL LOW (ref 150–450)
RBC: 5.2 x10E6/uL (ref 4.14–5.80)
RDW: 13.1 % (ref 11.6–15.4)
WBC: 5.6 10*3/uL (ref 3.4–10.8)

## 2019-10-23 LAB — COMPREHENSIVE METABOLIC PANEL
ALT: 12 IU/L (ref 0–44)
AST: 20 IU/L (ref 0–40)
Albumin/Globulin Ratio: 1.3 (ref 1.2–2.2)
Albumin: 4 g/dL (ref 3.6–4.6)
Alkaline Phosphatase: 103 IU/L (ref 39–117)
BUN/Creatinine Ratio: 16 (ref 10–24)
BUN: 24 mg/dL (ref 8–27)
Bilirubin Total: 0.7 mg/dL (ref 0.0–1.2)
CO2: 24 mmol/L (ref 20–29)
Calcium: 9.2 mg/dL (ref 8.6–10.2)
Chloride: 102 mmol/L (ref 96–106)
Creatinine, Ser: 1.49 mg/dL — ABNORMAL HIGH (ref 0.76–1.27)
GFR calc Af Amer: 48 mL/min/{1.73_m2} — ABNORMAL LOW (ref >59)
GFR calc non Af Amer: 42 mL/min/{1.73_m2} — ABNORMAL LOW (ref >59)
Globulin, Total: 3.2 g/dL (ref 1.5–4.5)
Glucose: 120 mg/dL — ABNORMAL HIGH (ref 65–99)
Potassium: 4.4 mmol/L (ref 3.5–5.2)
Sodium: 139 mmol/L (ref 134–144)
Total Protein: 7.2 g/dL (ref 6.0–8.5)

## 2019-10-23 LAB — LIPID PANEL
Chol/HDL Ratio: 3.4 ratio (ref 0.0–5.0)
Cholesterol, Total: 119 mg/dL (ref 100–199)
HDL: 35 mg/dL — ABNORMAL LOW (ref >39)
LDL Chol Calc (NIH): 62 mg/dL (ref 0–99)
Triglycerides: 124 mg/dL (ref 0–149)
VLDL Cholesterol Cal: 22 mg/dL (ref 5–40)

## 2019-11-03 ENCOUNTER — Ambulatory Visit (INDEPENDENT_AMBULATORY_CARE_PROVIDER_SITE_OTHER): Payer: Medicare Other | Admitting: Orthopedic Surgery

## 2019-11-03 ENCOUNTER — Encounter: Payer: Self-pay | Admitting: Orthopedic Surgery

## 2019-11-03 ENCOUNTER — Other Ambulatory Visit: Payer: Self-pay

## 2019-11-03 VITALS — Ht 69.0 in | Wt 180.0 lb

## 2019-11-03 DIAGNOSIS — B351 Tinea unguium: Secondary | ICD-10-CM

## 2019-11-03 DIAGNOSIS — I2510 Atherosclerotic heart disease of native coronary artery without angina pectoris: Secondary | ICD-10-CM

## 2019-11-03 NOTE — Progress Notes (Signed)
Office Visit Note   Patient: Jose [name]           Date of Birth: 11-27-1930           MRN: [id number] Visit Date: 11/03/2019              Requested by: Mosetta Putt, MD [address] [place],  [place] [address] PCP: Mosetta Putt, MD  Chief Complaint  Patient presents with  . Right Foot - Follow-up  . Left Foot - Follow-up      HPI: This is a pleasant gentleman who comes in with a history of onychomycosis.  He is here to have his nails trimmed.  No other complaints  Assessment & Plan: Visit Diagnoses: No diagnosis found.  Plan: He may follow-up in 2 months.  Follow-Up Instructions: No follow-ups on file.   Ortho Exam  Patient is alert, oriented, no adenopathy, well-dressed, normal affect, normal respiratory effort. Focused examination demonstrates thickened onychomycotic nails.  After obtaining verbal permission the nails were trimmed back.  The right great toenail did have some bleeding a silver nitrate stick was helped to coagulate this and a small pressure dressing was applied which he will remove at home  Imaging: No results found. No images are attached to the encounter.  Labs: Lab Results  Component Value Date   HGBA1C 6.2 (H) 07/10/2013   ESRSEDRATE 12 01/13/2016   ESRSEDRATE 20 12/21/2015   ESRSEDRATE 94 (H) 11/26/2015   REPTSTATUS 11/24/2015 FINAL 11/24/2015   REPTSTATUS 11/28/2015 FINAL 11/24/2015   GRAMSTAIN  11/24/2015    ABUNDANT WBC PRESENT,BOTH PMN AND MONONUCLEAR FEW SQUAMOUS EPITHELIAL CELLS PRESENT MODERATE GRAM POSITIVE COCCI IN PAIRS MODERATE GRAM NEGATIVE RODS Performed at [facility]    CULT  11/24/2015    NORMAL OROPHARYNGEAL FLORA Performed at [facility]      Lab Results  Component Value Date   ALBUMIN 4.0 10/22/2019   ALBUMIN 4.2 04/22/2014    Lab Results  Component Value Date   MG 1.6 (L) 11/22/2015   No results found for: VD25OH  No results found for: PREALBUMIN CBC EXTENDED Latest  Ref Rng & Units 10/22/2019 12/21/2015 11/25/2015  WBC 3.4 - 10.8 x10E3/uL 5.6 10.1 13.8(H)  RBC 4.14 - 5.80 x10E6/uL 5.20 4.74 4.36  HGB 13.0 - 17.7 g/dL 73.5 32.9 92.4  HCT 26.8 - 51.0 % 47.2 42.7 38.6(L)  PLT 150 - 450 x10E3/uL 113(L) 164 196  NEUTROABS 1,500 - 7,800 cells/uL - 9,090(H) -  LYMPHSABS 850 - 3,900 cells/uL - 808(L) -     Body mass index is 26.58 kg/m.  Orders:  No orders of the defined types were placed in this encounter.  No orders of the defined types were placed in this encounter.    Procedures: No procedures performed  Clinical Data: No additional findings.  ROS:  All other systems negative, except as noted in the HPI. Review of Systems  Objective: Vital Signs: Ht 5\' 9"  (1.753 m)   Wt 180 lb (81.6 kg)   BMI 26.58 kg/m   Specialty Comments:  No specialty comments available.  PMFS History: Patient Active Problem List   Diagnosis Date Noted  . History of stroke 01/05/2016  . Chronic respiratory failure (HCC) 12/16/2015  . HSV-1 (herpes simplex virus 1) infection 12/16/2015  . Interstitial lung disease (HCC) 11/27/2015  . BOOP (bronchiolitis obliterans with organizing pneumonia) (HCC) 11/27/2015  . Elevated troponin 11/27/2015  . Hydronephrosis, right 11/27/2015  . SOB (shortness of breath)   .  Acute respiratory failure with hypoxia (HCC) 11/26/2015  . Chronic kidney disease (CKD), stage III (moderate) 11/26/2015  . Acute on chronic diastolic (congestive) heart failure (HCC)   . Lobar pneumonia due to unspecified organism   . Acute on chronic combined systolic (congestive) and diastolic (congestive) heart failure (HCC) 11/22/2015  . Atrial fibrillation (HCC) 09/10/2013  . 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 Visual field loss 07/09/2013  . HYPERLIPIDEMIA-MIXED 12/23/2009  . CAD, NATIVE VESSEL 12/15/2009  . ERECTILE DYSFUNCTION 12/10/2008  . Essential hypertension 12/10/2008  . FATIGUE  12/10/2008   Past Medical History:  Diagnosis Date  . At risk for sleep apnea    STOP-BANG= 5   SENT TO PCP 05-20-2014  . Bilateral carotid artery stenosis    mild bilateral proximcal ICA  40% per duplex 11/ 2014  . Coronary artery disease    a. s/p CABG in 2007 w/ LIMA-LAD, SVG-D1, SVG-1st & 2nd Mrg, SVG-PDA and posterior lateral  . ED (erectile dysfunction)   . Eye infection 12/2014  . GERD (gastroesophageal reflux disease)   . History of embolic stroke no residual   56/ 2014  --  right PCA and MCA branch infarts (left side vision difficulties)  . Hypertension   . Other malaise and fatigue   . Paroxysmal atrial fibrillation (HCC)   . Pneumonia 10/2015   led to CHF, in hospital x 6 days  . S/P CABG x 6    2007  . Ureteral obstruction, right     Family History  Problem Relation Age of Onset  . Heart disease Mother        PPM  . Hypertension Mother   . Lymphoma Father   . Prostate cancer Father   . Prostate cancer Brother   . Aneurysm Brother        REPAIR  . CAD Brother        CABG  . Other Brother        STENTS IN LEGS  . Other Sister        STOMACH ISSUES  . Depression Sister   . Lung cancer Brother        Asbestosis    Past Surgical History:  Procedure Laterality Date  . APPENDECTOMY  1950's  . CARDIAC CATHETERIZATION  10-31-2005  dr wall   severe three vessel/  perserved LV  . CARDIOVASCULAR STRESS TEST  last one 05-19-2014  dr Jens Som   low risk lexiscan no exercise study/ small inferobasal infarct with no ischemia /  ef 53%  . CORONARY ARTERY BYPASS GRAFT  11-01-2005  dr Cornelius Moras   CLOSURE OF PFO/  LIMA to LAD,  SVG to D1,  SVG to 1st & 2nd  MARGINAL branches, SVG  to PDA and posterior lateral  . CYSTOSCOPY W/ URETERAL STENT PLACEMENT Right 05/21/2014   Procedure: CYSTOSCOPY WITH RETROGRADE PYELOGRAM/URETERAL STENT PLACEMENT;  Surgeon: Kathi Ludwig, MD;  Location: [facility];  Service: Urology;  Laterality: Right;  . CYSTOSCOPY WITH  URETEROSCOPY Right 05/21/2014   Procedure: CYSTOSCOPY WITH URETEROSCOPY;  Surgeon: Kathi Ludwig, MD;  Location[facility];  Service: Urology;  Laterality: Right;  . INGUINAL HERNIA REPAIR Bilateral   . LAPAROSCOPIC CHOLECYSTECTOMY  10-02-2000  . RIGHT URETER OBSTRUCTION SURGERY  age 39  . TEE WITHOUT CARDIOVERSION N/A 07/17/2013   Procedure: TRANSESOPHAGEAL ECHOCARDIOGRAM (TEE);  Surgeon: Wendall Stade, MD;  Location: [facility] ENDOSCOPY;  Service: Cardiovascular;  Laterality: N/A;  normal  LV size, mild prolapse of posterior mitral valve leaflet,  mild MR and TR, normal AV,  no LAA thrombus,  atrial septal aneurysm with no PFO/ ASD and negative bubble study, normal RV, normal aorta with no debris   Social History   Occupational History  . Occupation: retired  Tobacco Use  . Smoking status: Former Smoker    Packs/day: 1.00    Years: 35.00    Pack years: 35.00    Types: Cigarettes    Quit date: 08/29/1977    Years since quitting: 42.2  . Smokeless tobacco: Never Used  Substance and Sexual Activity  . Alcohol use: No    Alcohol/week: 0.0 standard drinks  . Drug use: No  . Sexual activity: Not on file

## 2020-01-05 ENCOUNTER — Other Ambulatory Visit: Payer: Self-pay

## 2020-01-05 ENCOUNTER — Encounter: Payer: Self-pay | Admitting: Orthopedic Surgery

## 2020-01-05 ENCOUNTER — Ambulatory Visit (INDEPENDENT_AMBULATORY_CARE_PROVIDER_SITE_OTHER): Payer: Medicare Other | Admitting: Physician Assistant

## 2020-01-05 DIAGNOSIS — B351 Tinea unguium: Secondary | ICD-10-CM | POA: Diagnosis not present

## 2020-01-05 DIAGNOSIS — I2510 Atherosclerotic heart disease of native coronary artery without angina pectoris: Secondary | ICD-10-CM

## 2020-01-05 NOTE — Progress Notes (Signed)
Office Visit Note   Patient: Jose [name]           Date of Birth: 26-Dec-1930           MRN: [id number] Visit Date: 01/05/2020              Requested by: Derinda Late, MD [address] [place]   [id number] PCP: Derinda Late, MD  Chief Complaint  Patient presents with  . Left Foot - Follow-up    NAIL TRIM  . Right Foot - Follow-up    NAIL TRIM      HPI: This is a pleasant 86 year old gentleman who comes in periodically for nail trim.  He has chronic onychomycosis of his nails.  He has no other complaints  Assessment & Plan: Visit Diagnoses: No diagnosis found.  Plan: After obtaining verbal consent nails were trimmed x10 there was no bleeding  Follow-Up Instructions: No follow-ups on file.   Ortho Exam  Patient is alert, oriented, no adenopathy, well-dressed, normal affect, normal respiratory effort. Bilateral feet thickened onychomycotic nails especially in the great toes no open skin areas no soft tissue swelling  Imaging: No results found. No images are attached to the encounter.  Labs: Lab Results  Component Value Date   HGBA1C 6.2 (H) 07/10/2013   ESRSEDRATE 12 01/13/2016   ESRSEDRATE 20 12/21/2015   ESRSEDRATE 94 (H) 11/26/2015   REPTSTATUS 11/24/2015 FINAL 11/24/2015   REPTSTATUS 11/28/2015 FINAL 11/24/2015   GRAMSTAIN  11/24/2015    ABUNDANT WBC PRESENT,BOTH PMN AND MONONUCLEAR FEW SQUAMOUS EPITHELIAL CELLS PRESENT MODERATE GRAM POSITIVE COCCI IN PAIRS MODERATE GRAM NEGATIVE RODS Performed at [facility]  11/24/2015    NORMAL OROPHARYNGEAL FLORA Performed at [facility]      Lab Results  Component Value Date   ALBUMIN 4.0 10/22/2019   ALBUMIN 4.2 04/22/2014    Lab Results  Component Value Date   MG 1.6 (L) 11/22/2015   No results found for: VD25OH  No results found for: PREALBUMIN CBC EXTENDED Latest Ref Rng & Units 10/22/2019 12/21/2015 11/25/2015  WBC 3.4 - 10.8 x10E3/uL 5.6 10.1 13.8(H)    RBC 4.14 - 5.80 x10E6/uL 5.20 4.74 4.36  HGB 13.0 - 17.7 g/dL 16.2 14.3 13.0  HCT 37.5 - 51.0 % 47.2 42.7 38.6(L)  PLT 150 - 450 x10E3/uL 113(L) 164 196  NEUTROABS 1,500 - 7,800 cells/uL - 9,090(H) -  LYMPHSABS 850 - 3,900 cells/uL - 808(L) -     There is no height or weight on file to calculate BMI.  Orders:  No orders of the defined types were placed in this encounter.  No orders of the defined types were placed in this encounter.    Procedures: No procedures performed  Clinical Data: No additional findings.  ROS:  All other systems negative, except as noted in the HPI. Review of Systems  Objective: Vital Signs: There were no vitals taken for this visit.  Specialty Comments:  No specialty comments available.  PMFS History: Patient Active Problem List   Diagnosis Date Noted  . History of stroke 01/05/2016  . Chronic respiratory failure ([place]) 12/16/2015  . HSV-1 (herpes simplex virus 1) infection 12/16/2015  . Interstitial lung disease ([place]) 11/27/2015  . BOOP (bronchiolitis obliterans with organizing pneumonia) ([place]) 11/27/2015  . Elevated troponin 11/27/2015  . Hydronephrosis, right 11/27/2015  . SOB (shortness of breath)   . Acute respiratory failure with hypoxia ([place]) 11/26/2015  . Chronic kidney disease (CKD), stage III (  moderate) 11/26/2015  . Acute on chronic diastolic (congestive) heart failure (HCC)   . Lobar pneumonia due to unspecified organism   . Acute on chronic combined systolic (congestive) and diastolic (congestive) heart failure (HCC) 11/22/2015  . Atrial fibrillation (HCC) 09/10/2013  . 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 Visual field loss 07/09/2013  . HYPERLIPIDEMIA-MIXED 12/23/2009  . CAD, NATIVE VESSEL 12/15/2009  . ERECTILE DYSFUNCTION 12/10/2008  . Essential hypertension 12/10/2008  . FATIGUE 12/10/2008   Past Medical History:  Diagnosis Date  . At risk for sleep apnea     STOP-BANG= 5   SENT TO PCP 05-20-2014  . Bilateral carotid artery stenosis    mild bilateral proximcal ICA  40% per duplex 11/ 2014  . Coronary artery disease    a. s/p CABG in 2007 w/ LIMA-LAD, SVG-D1, SVG-1st & 2nd Mrg, SVG-PDA and posterior lateral  . ED (erectile dysfunction)   . Eye infection 12/2014  . GERD (gastroesophageal reflux disease)   . History of embolic stroke no residual   59/ 2014  --  right PCA and MCA branch infarts (left side vision difficulties)  . Hypertension   . Other malaise and fatigue   . Paroxysmal atrial fibrillation (HCC)   . Pneumonia 10/2015   led to CHF, in hospital x 6 days  . S/P CABG x 6    2007  . Ureteral obstruction, right     Family History  Problem Relation Age of Onset  . Heart disease Mother        PPM  . Hypertension Mother   . Lymphoma Father   . Prostate cancer Father   . Prostate cancer Brother   . Aneurysm Brother        REPAIR  . CAD Brother        CABG  . Other Brother        STENTS IN LEGS  . Other Sister        STOMACH ISSUES  . Depression Sister   . Lung cancer Brother        Asbestosis    Past Surgical History:  Procedure Laterality Date  . APPENDECTOMY  1950's  . CARDIAC CATHETERIZATION  10-31-2005  dr wall   severe three vessel/  perserved LV  . CARDIOVASCULAR STRESS TEST  last one 05-19-2014  dr Jens Som   low risk lexiscan no exercise study/ small inferobasal infarct with no ischemia /  ef 53%  . CORONARY ARTERY BYPASS GRAFT  11-01-2005  dr Cornelius Moras   CLOSURE OF PFO/  LIMA to LAD,  SVG to D1,  SVG to 1st & 2nd  MARGINAL branches, SVG  to PDA and posterior lateral  . CYSTOSCOPY W/ URETERAL STENT PLACEMENT Right 05/21/2014   Procedure: CYSTOSCOPY WITH RETROGRADE PYELOGRAM/URETERAL STENT PLACEMENT;  Surgeon: Kathi Ludwig, MD;  [facility];  Service: Urology;  Laterality: Right;  . CYSTOSCOPY WITH URETEROSCOPY Right 05/21/2014   Procedure: CYSTOSCOPY WITH URETEROSCOPY;  Surgeon: Kathi Ludwig, MD;  Location: [facility];  Service: Urology;  Laterality: Right;  . INGUINAL HERNIA REPAIR Bilateral   . LAPAROSCOPIC CHOLECYSTECTOMY  10-02-2000  . RIGHT URETER OBSTRUCTION SURGERY  age 24  . TEE WITHOUT CARDIOVERSION N/A 07/17/2013   Procedure: TRANSESOPHAGEAL ECHOCARDIOGRAM (TEE);  Surgeon: Wendall Stade, MD;  Location: [facility] ENDOSCOPY;  Service: Cardiovascular;  Laterality: N/A;  normal LV size, mild prolapse of posterior mitral valve leaflet,  mild MR and TR, normal  AV,  no LAA thrombus,  atrial septal aneurysm with no PFO/ ASD and negative bubble study, normal RV, normal aorta with no debris   Social History   Occupational History  . Occupation: retired  Tobacco Use  . Smoking status: Former Smoker    Packs/day: 1.00    Years: 35.00    Pack years: 35.00    Types: Cigarettes    Quit date: 08/29/1977    Years since quitting: 42.3  . Smokeless tobacco: Never Used  Substance and Sexual Activity  . Alcohol use: No    Alcohol/week: 0.0 standard drinks  . Drug use: No  . Sexual activity: Not on file

## 2020-01-12 ENCOUNTER — Other Ambulatory Visit: Payer: Self-pay | Admitting: Cardiology

## 2020-03-08 ENCOUNTER — Ambulatory Visit: Payer: PRIVATE HEALTH INSURANCE | Admitting: Orthopedic Surgery

## 2020-03-11 ENCOUNTER — Encounter: Payer: Self-pay | Admitting: Orthopedic Surgery

## 2020-03-11 ENCOUNTER — Ambulatory Visit (INDEPENDENT_AMBULATORY_CARE_PROVIDER_SITE_OTHER): Payer: Medicare Other | Admitting: Physician Assistant

## 2020-03-11 VITALS — Ht 69.0 in | Wt 180.0 lb

## 2020-03-11 DIAGNOSIS — B351 Tinea unguium: Secondary | ICD-10-CM

## 2020-03-11 NOTE — Progress Notes (Signed)
Office Visit Note   Patient: Jose [name]           Date of Birth: 1930-12-07           MRN: [id number] Visit Date: 03/11/2020              Requested by: Mosetta Putt, MD [address] [place],  [place] [address] PCP: Mosetta Putt, MD  Chief Complaint  Patient presents with  . Right Foot - Follow-up  . Left Foot - Follow-up      HPI: This is a pleasant gentleman who comes in every 3 months for nail trim no other complaints  Assessment & Plan: Visit Diagnoses: No diagnosis found.  Plan: Nails were trimmed to a healthy surface follow-up in 3 months  Follow-Up Instructions: No follow-ups on file.   Ortho Exam  Patient is alert, oriented, no adenopathy, well-dressed, normal affect, normal respiratory effort. Onychomycotic nails x10.  Nails were trimmed x10 without difficulty.  Feet are in good condition no open sores no swelling no cellulitis  Imaging: No results found. No images are attached to the encounter.  Labs: Lab Results  Component Value Date   HGBA1C 6.2 (H) 07/10/2013   ESRSEDRATE 12 01/13/2016   ESRSEDRATE 20 12/21/2015   ESRSEDRATE 94 (H) 11/26/2015   REPTSTATUS 11/24/2015 FINAL 11/24/2015   REPTSTATUS 11/28/2015 FINAL 11/24/2015   GRAMSTAIN  11/24/2015    ABUNDANT WBC PRESENT,BOTH PMN AND MONONUCLEAR FEW SQUAMOUS EPITHELIAL CELLS PRESENT MODERATE GRAM POSITIVE COCCI IN PAIRS MODERATE GRAM NEGATIVE RODS Performed at [facility]    CULT  11/24/2015    NORMAL OROPHARYNGEAL FLORA Performed at [facility]      Lab Results  Component Value Date   ALBUMIN 4.0 10/22/2019   ALBUMIN 4.2 04/22/2014    Lab Results  Component Value Date   MG 1.6 (L) 11/22/2015   No results found for: VD25OH  No results found for: PREALBUMIN CBC EXTENDED Latest Ref Rng & Units 10/22/2019 12/21/2015 11/25/2015  WBC 3.4 - 10.8 x10E3/uL 5.6 10.1 13.8(H)  RBC 4.14 - 5.80 x10E6/uL 5.20 4.74 4.36  HGB 13.0 - 17.7 g/dL 10.6 26.9 48.5  HCT  46.2 - 51.0 % 47.2 42.7 38.6(L)  PLT 150 - 450 x10E3/uL 113(L) 164 196  NEUTROABS 1,500 - 7,800 cells/uL - 9,090(H) -  LYMPHSABS 850 - 3,900 cells/uL - 808(L) -     Body mass index is 26.58 kg/m.  Orders:  No orders of the defined types were placed in this encounter.  No orders of the defined types were placed in this encounter.    Procedures: No procedures performed  Clinical Data: No additional findings.  ROS:  All other systems negative, except as noted in the HPI. Review of Systems  Objective: Vital Signs: Ht 5\' 9"  (1.753 m)   Wt 180 lb (81.6 kg)   BMI 26.58 kg/m   Specialty Comments:  No specialty comments available.  PMFS History: Patient Active Problem List   Diagnosis Date Noted  . History of stroke 01/05/2016  . Chronic respiratory failure (HCC) 12/16/2015  . HSV-1 (herpes simplex virus 1) infection 12/16/2015  . Interstitial lung disease (HCC) 11/27/2015  . BOOP (bronchiolitis obliterans with organizing pneumonia) (HCC) 11/27/2015  . Elevated troponin 11/27/2015  . Hydronephrosis, right 11/27/2015  . SOB (shortness of breath)   . Acute respiratory failure with hypoxia (HCC) 11/26/2015  . Chronic kidney disease (CKD), stage III (moderate) 11/26/2015  . Acute on chronic diastolic (congestive) heart failure (HCC)   .  Lobar pneumonia due to unspecified organism   . Acute on chronic combined systolic (congestive) and diastolic (congestive) heart failure (HCC) 11/22/2015  . Atrial fibrillation (HCC) 09/10/2013  . 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 Visual field loss 07/09/2013  . HYPERLIPIDEMIA-MIXED 12/23/2009  . CAD, NATIVE VESSEL 12/15/2009  . ERECTILE DYSFUNCTION 12/10/2008  . Essential hypertension 12/10/2008  . FATIGUE 12/10/2008   Past Medical History:  Diagnosis Date  . At risk for sleep apnea    STOP-BANG= 5   SENT TO PCP 05-20-2014  . Bilateral carotid artery stenosis    mild bilateral  proximcal ICA  40% per duplex 11/ 2014  . Coronary artery disease    a. s/p CABG in 2007 w/ LIMA-LAD, SVG-D1, SVG-1st & 2nd Mrg, SVG-PDA and posterior lateral  . ED (erectile dysfunction)   . Eye infection 12/2014  . GERD (gastroesophageal reflux disease)   . History of embolic stroke no residual   19/ 2014  --  right PCA and MCA branch infarts (left side vision difficulties)  . Hypertension   . Other malaise and fatigue   . Paroxysmal atrial fibrillation (HCC)   . Pneumonia 10/2015   led to CHF, in hospital x 6 days  . S/P CABG x 6    2007  . Ureteral obstruction, right     Family History  Problem Relation Age of Onset  . Heart disease Mother        PPM  . Hypertension Mother   . Lymphoma Father   . Prostate cancer Father   . Prostate cancer Brother   . Aneurysm Brother        REPAIR  . CAD Brother        CABG  . Other Brother        STENTS IN LEGS  . Other Sister        STOMACH ISSUES  . Depression Sister   . Lung cancer Brother        Asbestosis    Past Surgical History:  Procedure Laterality Date  . APPENDECTOMY  1950's  . CARDIAC CATHETERIZATION  10-31-2005  dr wall   severe three vessel/  perserved LV  . CARDIOVASCULAR STRESS TEST  last one 05-19-2014  dr Jens Som   low risk lexiscan no exercise study/ small inferobasal infarct with no ischemia /  ef 53%  . CORONARY ARTERY BYPASS GRAFT  11-01-2005  dr Cornelius Moras   CLOSURE OF PFO/  LIMA to LAD,  SVG to D1,  SVG to 1st & 2nd  MARGINAL branches, SVG  to PDA and posterior lateral  . CYSTOSCOPY W/ URETERAL STENT PLACEMENT Right 05/21/2014   Procedure: CYSTOSCOPY WITH RETROGRADE PYELOGRAM/URETERAL STENT PLACEMENT;  Surgeon: Kathi Ludwig, MD;  Location: [facility];  Service: Urology;  Laterality: Right;  . CYSTOSCOPY WITH URETEROSCOPY Right 05/21/2014   Procedure: CYSTOSCOPY WITH URETEROSCOPY;  Surgeon: Kathi Ludwig, MD;  Location: [facility];  Service: Urology;  Laterality: Right;   . INGUINAL HERNIA REPAIR Bilateral   . LAPAROSCOPIC CHOLECYSTECTOMY  10-02-2000  . RIGHT URETER OBSTRUCTION SURGERY  age 46  . TEE WITHOUT CARDIOVERSION N/A 07/17/2013   Procedure: TRANSESOPHAGEAL ECHOCARDIOGRAM (TEE);  Surgeon: Wendall Stade, MD;  Location: [facility] ENDOSCOPY;  Service: Cardiovascular;  Laterality: N/A;  normal LV size, mild prolapse of posterior mitral valve leaflet,  mild MR and TR, normal AV,  no LAA thrombus,  atrial septal aneurysm with no PFO/ ASD and negative  bubble study, normal RV, normal aorta with no debris   Social History   Occupational History  . Occupation: retired  Tobacco Use  . Smoking status: Former Smoker    Packs/day: 1.00    Years: 35.00    Pack years: 35.00    Types: Cigarettes    Quit date: 08/29/1977    Years since quitting: 42.5  . Smokeless tobacco: Never Used  Substance and Sexual Activity  . Alcohol use: No    Alcohol/week: 0.0 standard drinks  . Drug use: No  . Sexual activity: Not on file

## 2020-05-12 ENCOUNTER — Encounter: Payer: Self-pay | Admitting: Physician Assistant

## 2020-05-12 ENCOUNTER — Ambulatory Visit (INDEPENDENT_AMBULATORY_CARE_PROVIDER_SITE_OTHER): Payer: Medicare Other | Admitting: Physician Assistant

## 2020-05-12 DIAGNOSIS — B351 Tinea unguium: Secondary | ICD-10-CM | POA: Diagnosis not present

## 2020-05-12 NOTE — Progress Notes (Signed)
Office Visit Note   Patient: Jose [name]           Date of Birth: 1931/06/16           MRN: [id number] Visit Date: 05/12/2020              Requested by: Mosetta Putt, MD [address] [place],  [place] [address] PCP: Mosetta Putt, MD  No chief complaint on file.     HPI: This is a pleasant gentleman who comes in periodically for nail trim.  He has no difficulties with his feet other than he does get some forefoot pain.  He had what he calls a cyst removed there many years ago.  Assessment & Plan: Visit Diagnoses: No diagnosis found.  Plan: Follow-up in 1 to 2 months.  Showed him a stretching program that he can use to improve his flexibility so as not to play so much weight on his forefoot  Follow-Up Instructions: No follow-ups on file.   Ortho Exam  Patient is alert, oriented, no adenopathy, well-dressed, normal affect, normal respiratory effort. Bilateral feet onychomycotic nails no open areas or sores or ulcers.  Feet are overall in good condition.  He does have fat pad atrophy in the forefoot bilaterally no palpable cysts or fullness after obtaining verbal consent onychomycotic nails were trimmed x10  Imaging: No results found. No images are attached to the encounter.  Labs: Lab Results  Component Value Date   HGBA1C 6.2 (H) 07/10/2013   ESRSEDRATE 12 01/13/2016   ESRSEDRATE 20 12/21/2015   ESRSEDRATE 94 (H) 11/26/2015   REPTSTATUS 11/24/2015 FINAL 11/24/2015   REPTSTATUS 11/28/2015 FINAL 11/24/2015   GRAMSTAIN  11/24/2015    ABUNDANT WBC PRESENT,BOTH PMN AND MONONUCLEAR FEW SQUAMOUS EPITHELIAL CELLS PRESENT MODERATE GRAM POSITIVE COCCI IN PAIRS MODERATE GRAM NEGATIVE RODS Performed at [facility]    CULT  11/24/2015    NORMAL OROPHARYNGEAL FLORA Performed at [facility]      Lab Results  Component Value Date   ALBUMIN 4.0 10/22/2019   ALBUMIN 4.2 04/22/2014    Lab Results  Component Value Date   MG 1.6 (L)  11/22/2015   No results found for: VD25OH  No results found for: PREALBUMIN CBC EXTENDED Latest Ref Rng & Units 10/22/2019 12/21/2015 11/25/2015  WBC 3.4 - 10.8 x10E3/uL 5.6 10.1 13.8(H)  RBC 4.14 - 5.80 x10E6/uL 5.20 4.74 4.36  HGB 13.0 - 17.7 g/dL 30.1 60.1 09.3  HCT 23.5 - 51.0 % 47.2 42.7 38.6(L)  PLT 150 - 450 x10E3/uL 113(L) 164 196  NEUTROABS 1,500 - 7,800 cells/uL - 9,090(H) -  LYMPHSABS 850 - 3,900 cells/uL - 808(L) -     There is no height or weight on file to calculate BMI.  Orders:  No orders of the defined types were placed in this encounter.  No orders of the defined types were placed in this encounter.    Procedures: No procedures performed  Clinical Data: No additional findings.  ROS:  All other systems negative, except as noted in the HPI. Review of Systems  Objective: Vital Signs: There were no vitals taken for this visit.  Specialty Comments:  No specialty comments available.  PMFS History: Patient Active Problem List   Diagnosis Date Noted  . History of stroke 01/05/2016  . Chronic respiratory failure (HCC) 12/16/2015  . HSV-1 (herpes simplex virus 1) infection 12/16/2015  . Interstitial lung disease (HCC) 11/27/2015  . BOOP (bronchiolitis obliterans with organizing pneumonia) (HCC) 11/27/2015  .  Elevated troponin 11/27/2015  . Hydronephrosis, right 11/27/2015  . SOB (shortness of breath)   . Acute respiratory failure with hypoxia (HCC) 11/26/2015  . Chronic kidney disease (CKD), stage III (moderate) 11/26/2015  . Acute on chronic diastolic (congestive) heart failure (HCC)   . Lobar pneumonia due to unspecified organism   . Acute on chronic combined systolic (congestive) and diastolic (congestive) heart failure (HCC) 11/22/2015  . Atrial fibrillation (HCC) 09/10/2013  . 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 Visual field loss 07/09/2013  . HYPERLIPIDEMIA-MIXED 12/23/2009  . CAD, NATIVE  VESSEL 12/15/2009  . ERECTILE DYSFUNCTION 12/10/2008  . Essential hypertension 12/10/2008  . FATIGUE 12/10/2008   Past Medical History:  Diagnosis Date  . At risk for sleep apnea    STOP-BANG= 5   SENT TO PCP 05-20-2014  . Bilateral carotid artery stenosis    mild bilateral proximcal ICA  40% per duplex 11/ 2014  . Coronary artery disease    a. s/p CABG in 2007 w/ LIMA-LAD, SVG-D1, SVG-1st & 2nd Mrg, SVG-PDA and posterior lateral  . ED (erectile dysfunction)   . Eye infection 12/2014  . GERD (gastroesophageal reflux disease)   . History of embolic stroke no residual   94/ 2014  --  right PCA and MCA branch infarts (left side vision difficulties)  . Hypertension   . Other malaise and fatigue   . Paroxysmal atrial fibrillation (HCC)   . Pneumonia 10/2015   led to CHF, in hospital x 6 days  . S/P CABG x 6    2007  . Ureteral obstruction, right     Family History  Problem Relation Age of Onset  . Heart disease Mother        PPM  . Hypertension Mother   . Lymphoma Father   . Prostate cancer Father   . Prostate cancer Brother   . Aneurysm Brother        REPAIR  . CAD Brother        CABG  . Other Brother        STENTS IN LEGS  . Other Sister        STOMACH ISSUES  . Depression Sister   . Lung cancer Brother        Asbestosis    Past Surgical History:  Procedure Laterality Date  . APPENDECTOMY  1950's  . CARDIAC CATHETERIZATION  10-31-2005  dr wall   severe three vessel/  perserved LV  . CARDIOVASCULAR STRESS TEST  last one 05-19-2014  dr Jens Som   low risk lexiscan no exercise study/ small inferobasal infarct with no ischemia /  ef 53%  . CORONARY ARTERY BYPASS GRAFT  11-01-2005  dr Cornelius Moras   CLOSURE OF PFO/  LIMA to LAD,  SVG to D1,  SVG to 1st & 2nd  MARGINAL branches, SVG  to PDA and posterior lateral  . CYSTOSCOPY W/ URETERAL STENT PLACEMENT Right 05/21/2014   Procedure: CYSTOSCOPY WITH RETROGRADE PYELOGRAM/URETERAL STENT PLACEMENT;  Surgeon: Kathi Ludwig, MD;   Location: [facility];  Service: Urology;  Laterality: Right;  . CYSTOSCOPY WITH URETEROSCOPY Right 05/21/2014   Procedure: CYSTOSCOPY WITH URETEROSCOPY;  Surgeon: Kathi Ludwig, MD;  Location: [facility];  Service: Urology;  Laterality: Right;  . INGUINAL HERNIA REPAIR Bilateral   . LAPAROSCOPIC CHOLECYSTECTOMY  10-02-2000  . RIGHT URETER OBSTRUCTION SURGERY  age 71  . TEE WITHOUT CARDIOVERSION N/A 07/17/2013   Procedure: TRANSESOPHAGEAL ECHOCARDIOGRAM (TEE);  Surgeon: Wendall Stade, MD;  Location: [facility] ENDOSCOPY;  Service: Cardiovascular;  Laterality: N/A;  normal LV size, mild prolapse of posterior mitral valve leaflet,  mild MR and TR, normal AV,  no LAA thrombus,  atrial septal aneurysm with no PFO/ ASD and negative bubble study, normal RV, normal aorta with no debris   Social History   Occupational History  . Occupation: retired  Tobacco Use  . Smoking status: Former Smoker    Packs/day: 1.00    Years: 35.00    Pack years: 35.00    Types: Cigarettes    Quit date: 08/29/1977    Years since quitting: 42.7  . Smokeless tobacco: Never Used  Substance and Sexual Activity  . Alcohol use: No    Alcohol/week: 0.0 standard drinks  . Drug use: No  . Sexual activity: Not on file

## 2020-07-12 ENCOUNTER — Ambulatory Visit (INDEPENDENT_AMBULATORY_CARE_PROVIDER_SITE_OTHER): Payer: Medicare Other | Admitting: Physician Assistant

## 2020-07-12 ENCOUNTER — Encounter: Payer: Self-pay | Admitting: Physician Assistant

## 2020-07-12 VITALS — Ht 69.0 in | Wt 180.0 lb

## 2020-07-12 DIAGNOSIS — B351 Tinea unguium: Secondary | ICD-10-CM

## 2020-07-12 NOTE — Progress Notes (Signed)
Office Visit Note   Patient: Jose [name]           Date of Birth: [birth year]           MRN: [id number] Visit Date: 07/12/2020              Requested by: Mosetta Putt, MD [address] [place],  [place] [address] PCP: Mosetta Putt, MD  Chief Complaint  Patient presents with  . Right Foot - Follow-up    Nail trim  . Left Foot - Follow-up    Nail trim      HPI: This is a pleasant 86 year old gentleman who comes in periodically for nail trim.  He has no other complaints today other than his onychomycotic nails  Assessment & Plan: Visit Diagnoses: No diagnosis found.  Plan: Patient will follow up in 3 months or sooner if needed  Follow-Up Instructions: No follow-ups on file.   Ortho Exam  Patient is alert, oriented, no adenopathy, well-dressed, normal affect, normal respiratory effort. Bilateral feet no open ulcers no cellulitis palpable pulses.  Onychomycotic nails x10 were trimmed  Imaging: No results found. No images are attached to the encounter.  Labs: Lab Results  Component Value Date   HGBA1C 6.2 (H) 07/10/2013   ESRSEDRATE 12 01/13/2016   ESRSEDRATE 20 12/21/2015   ESRSEDRATE 94 (H) 11/26/2015   REPTSTATUS 11/24/2015 FINAL 11/24/2015   REPTSTATUS 11/28/2015 FINAL 11/24/2015   GRAMSTAIN  11/24/2015    ABUNDANT WBC PRESENT,BOTH PMN AND MONONUCLEAR FEW SQUAMOUS EPITHELIAL CELLS PRESENT MODERATE GRAM POSITIVE COCCI IN PAIRS MODERATE GRAM NEGATIVE RODS Performed at [facility]    CULT  11/24/2015    NORMAL OROPHARYNGEAL FLORA Performed at [facility]      Lab Results  Component Value Date   ALBUMIN 4.0 10/22/2019   ALBUMIN 4.2 04/22/2014    Lab Results  Component Value Date   MG 1.6 (L) 11/22/2015   No results found for: VD25OH  No results found for: PREALBUMIN CBC EXTENDED Latest Ref Rng & Units 10/22/2019 12/21/2015 11/25/2015  WBC 3.4 - 10.8 x10E3/uL 5.6 10.1 13.8(H)  RBC 4.14 - 5.80 x10E6/uL 5.20 4.74 4.36    HGB 13.0 - 17.7 g/dL 17.0 01.7 49.4  HCT 49.6 - 51.0 % 47.2 42.7 38.6(L)  PLT 150 - 450 x10E3/uL 113(L) 164 196  NEUTROABS 1,500 - 7,800 cells/uL - 9,090(H) -  LYMPHSABS 850 - 3,900 cells/uL - 808(L) -     Body mass index is 26.58 kg/m.  Orders:  No orders of the defined types were placed in this encounter.  No orders of the defined types were placed in this encounter.    Procedures: No procedures performed  Clinical Data: No additional findings.  ROS:  All other systems negative, except as noted in the HPI. Review of Systems  Objective: Vital Signs: Ht 5\' 9"  (1.753 m)   Wt 180 lb (81.6 kg)   BMI 26.58 kg/m   Specialty Comments:  No specialty comments available.  PMFS History: Patient Active Problem List   Diagnosis Date Noted  . History of stroke 01/05/2016  . Chronic respiratory failure (HCC) 12/16/2015  . HSV-1 (herpes simplex virus 1) infection 12/16/2015  . Interstitial lung disease (HCC) 11/27/2015  . BOOP (bronchiolitis obliterans with organizing pneumonia) (HCC) 11/27/2015  . Elevated troponin 11/27/2015  . Hydronephrosis, right 11/27/2015  . SOB (shortness of breath)   . Acute respiratory failure with hypoxia (HCC) 11/26/2015  . Chronic kidney disease (CKD), stage III (moderate) (  HCC) 11/26/2015  . Acute on chronic diastolic (congestive) heart failure (HCC)   . Lobar pneumonia due to unspecified organism   . Acute on chronic combined systolic (congestive) and diastolic (congestive) heart failure (HCC) 11/22/2015  . Atrial fibrillation (HCC) 09/10/2013  . 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 Visual field loss 07/09/2013  . HYPERLIPIDEMIA-MIXED 12/23/2009  . CAD, NATIVE VESSEL 12/15/2009  . ERECTILE DYSFUNCTION 12/10/2008  . Essential hypertension 12/10/2008  . FATIGUE 12/10/2008   Past Medical History:  Diagnosis Date  . At risk for sleep apnea    STOP-BANG= 5   SENT TO PCP 05-20-2014  .  Bilateral carotid artery stenosis    mild bilateral proximcal ICA  40% per duplex 11/ 2014  . Coronary artery disease    a. s/p CABG in 2007 w/ LIMA-LAD, SVG-D1, SVG-1st & 2nd Mrg, SVG-PDA and posterior lateral  . ED (erectile dysfunction)   . Eye infection 12/2014  . GERD (gastroesophageal reflux disease)   . History of embolic stroke no residual   73/ 2014  --  right PCA and MCA branch infarts (left side vision difficulties)  . Hypertension   . Other malaise and fatigue   . Paroxysmal atrial fibrillation (HCC)   . Pneumonia 10/2015   led to CHF, in hospital x 6 days  . S/P CABG x 6    2007  . Ureteral obstruction, right     Family History  Problem Relation Age of Onset  . Heart disease Mother        PPM  . Hypertension Mother   . Lymphoma Father   . Prostate cancer Father   . Prostate cancer Brother   . Aneurysm Brother        REPAIR  . CAD Brother        CABG  . Other Brother        STENTS IN LEGS  . Other Sister        STOMACH ISSUES  . Depression Sister   . Lung cancer Brother        Asbestosis    Past Surgical History:  Procedure Laterality Date  . APPENDECTOMY  1950's  . CARDIAC CATHETERIZATION  10-31-2005  dr wall   severe three vessel/  perserved LV  . CARDIOVASCULAR STRESS TEST  last one 05-19-2014  dr Jens Som   low risk lexiscan no exercise study/ small inferobasal infarct with no ischemia /  ef 53%  . CORONARY ARTERY BYPASS GRAFT  11-01-2005  dr Cornelius Moras   CLOSURE OF PFO/  LIMA to LAD,  SVG to D1,  SVG to 1st & 2nd  MARGINAL branches, SVG  to PDA and posterior lateral  . CYSTOSCOPY W/ URETERAL STENT PLACEMENT Right 05/21/2014   Procedure: CYSTOSCOPY WITH RETROGRADE PYELOGRAM/URETERAL STENT PLACEMENT;  Surgeon: Kathi Ludwig, MD;  [facility];  Service: Urology;  Laterality: Right;  . CYSTOSCOPY WITH URETEROSCOPY Right 05/21/2014   Procedure: CYSTOSCOPY WITH URETEROSCOPY;  Surgeon: Kathi Ludwig, MD;  Location: [facility];  Service: Urology;  Laterality: Right;  . INGUINAL HERNIA REPAIR Bilateral   . LAPAROSCOPIC CHOLECYSTECTOMY  10-02-2000  . RIGHT URETER OBSTRUCTION SURGERY  age 86  . TEE WITHOUT CARDIOVERSION N/A 07/17/2013   Procedure: TRANSESOPHAGEAL ECHOCARDIOGRAM (TEE);  Surgeon: Wendall Stade, MD;  Location[facility];  Service: Cardiovascular;  Laterality: N/A;  normal LV size, mild prolapse of posterior mitral valve leaflet,  mild MR and TR, normal  AV,  no LAA thrombus,  atrial septal aneurysm with no PFO/ ASD and negative bubble study, normal RV, normal aorta with no debris   Social History   Occupational History  . Occupation: retired  Tobacco Use  . Smoking status: Former Smoker    Packs/day: 1.00    Years: 35.00    Pack years: 35.00    Types: Cigarettes    Quit date: 08/29/1977    Years since quitting: 42.8  . Smokeless tobacco: Never Used  Substance and Sexual Activity  . Alcohol use: No    Alcohol/week: 0.0 standard drinks  . Drug use: No  . Sexual activity: Not on file

## 2020-09-13 ENCOUNTER — Ambulatory Visit: Payer: PRIVATE HEALTH INSURANCE | Admitting: Physician Assistant

## 2020-09-27 ENCOUNTER — Encounter: Payer: Self-pay | Admitting: Physician Assistant

## 2020-09-27 ENCOUNTER — Ambulatory Visit (INDEPENDENT_AMBULATORY_CARE_PROVIDER_SITE_OTHER): Payer: Medicare Other | Admitting: Physician Assistant

## 2020-09-27 DIAGNOSIS — B351 Tinea unguium: Secondary | ICD-10-CM | POA: Diagnosis not present

## 2020-09-27 NOTE — Progress Notes (Signed)
Office Visit Note   Patient: Jose [name]           Date of Birth: February 11, 1931           MRN: [id number] Visit Date: 09/27/2020              Requested by: Mosetta Putt, MD [address] [place],  [place] [address] PCP: Mosetta Putt, MD  Chief Complaint  Patient presents with  . Right Foot - Follow-up    Nail trim   . Left Foot - Follow-up      HPI: Patient is a pleasant 87 year old gentleman who comes in periodically for a nail trim.  He has a history of onychomycotic nails.  No other new issues today  Assessment & Plan: Visit Diagnoses: No diagnosis found.  Plan: Patient will follow up in 3 months or sooner if needed  Follow-Up Instructions: No follow-ups on file.   Ortho Exam  Patient is alert, oriented, no adenopathy, well-dressed, normal affect, normal respiratory effort. Bilateral feet he has no swelling no cellulitis pulses are palpable.  No signs of infection.  He does have bilateral onychomycotic nails on both of his feet.  These were trimmed to a healthy stable surface.  Imaging: No results found. No images are attached to the encounter.  Labs: Lab Results  Component Value Date   HGBA1C 6.2 (H) 07/10/2013   ESRSEDRATE 12 01/13/2016   ESRSEDRATE 20 12/21/2015   ESRSEDRATE 94 (H) 11/26/2015   REPTSTATUS 11/24/2015 FINAL 11/24/2015   REPTSTATUS 11/28/2015 FINAL 11/24/2015   GRAMSTAIN  11/24/2015    ABUNDANT WBC PRESENT,BOTH PMN AND MONONUCLEAR FEW SQUAMOUS EPITHELIAL CELLS PRESENT MODERATE GRAM POSITIVE COCCI IN PAIRS MODERATE GRAM NEGATIVE RODS Performed at [facility]    CULT  11/24/2015    NORMAL OROPHARYNGEAL FLORA Performed at [facility]      Lab Results  Component Value Date   ALBUMIN 4.0 10/22/2019   ALBUMIN 4.2 04/22/2014    Lab Results  Component Value Date   MG 1.6 (L) 11/22/2015   No results found for: VD25OH  No results found for: PREALBUMIN CBC EXTENDED Latest Ref Rng & Units 10/22/2019  12/21/2015 11/25/2015  WBC 3.4 - 10.8 x10E3/uL 5.6 10.1 13.8(H)  RBC 4.14 - 5.80 x10E6/uL 5.20 4.74 4.36  HGB 13.0 - 17.7 g/dL 61.9 50.9 32.6  HCT 71.2 - 51.0 % 47.2 42.7 38.6(L)  PLT 150 - 450 x10E3/uL 113(L) 164 196  NEUTROABS 1,500 - 7,800 cells/uL - 9,090(H) -  LYMPHSABS 850 - 3,900 cells/uL - 808(L) -     There is no height or weight on file to calculate BMI.  Orders:  No orders of the defined types were placed in this encounter.  No orders of the defined types were placed in this encounter.    Procedures: No procedures performed  Clinical Data: No additional findings.  ROS:  All other systems negative, except as noted in the HPI. Review of Systems  Objective: Vital Signs: There were no vitals taken for this visit.  Specialty Comments:  No specialty comments available.  PMFS History: Patient Active Problem List   Diagnosis Date Noted  . History of stroke 01/05/2016  . Chronic respiratory failure (HCC) 12/16/2015  . HSV-1 (herpes simplex virus 1) infection 12/16/2015  . Interstitial lung disease (HCC) 11/27/2015  . BOOP (bronchiolitis obliterans with organizing pneumonia) (HCC) 11/27/2015  . Elevated troponin 11/27/2015  . Hydronephrosis, right 11/27/2015  . SOB (shortness of breath)   . Acute  respiratory failure with hypoxia (HCC) 11/26/2015  . Chronic kidney disease (CKD), stage III (moderate) (HCC) 11/26/2015  . Acute on chronic diastolic (congestive) heart failure (HCC)   . Lobar pneumonia due to unspecified organism   . Acute on chronic combined systolic (congestive) and diastolic (congestive) heart failure (HCC) 11/22/2015  . Atrial fibrillation (HCC) 09/10/2013  . 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 Visual field loss 07/09/2013  . HYPERLIPIDEMIA-MIXED 12/23/2009  . CAD, NATIVE VESSEL 12/15/2009  . ERECTILE DYSFUNCTION 12/10/2008  . Essential hypertension 12/10/2008  . FATIGUE 12/10/2008   Past  Medical History:  Diagnosis Date  . At risk for sleep apnea    STOP-BANG= 5   SENT TO PCP 05-20-2014  . Bilateral carotid artery stenosis    mild bilateral proximcal ICA  40% per duplex 11/ 2014  . Coronary artery disease    a. s/p CABG in 2007 w/ LIMA-LAD, SVG-D1, SVG-1st & 2nd Mrg, SVG-PDA and posterior lateral  . ED (erectile dysfunction)   . Eye infection 12/2014  . GERD (gastroesophageal reflux disease)   . History of embolic stroke no residual   06/ 2014  --  right PCA and MCA branch infarts (left side vision difficulties)  . Hypertension   . Other malaise and fatigue   . Paroxysmal atrial fibrillation (HCC)   . Pneumonia 10/2015   led to CHF, in hospital x 6 days  . S/P CABG x 6    2007  . Ureteral obstruction, right     Family History  Problem Relation Age of Onset  . Heart disease Mother        PPM  . Hypertension Mother   . Lymphoma Father   . Prostate cancer Father   . Prostate cancer Brother   . Aneurysm Brother        REPAIR  . CAD Brother        CABG  . Other Brother        STENTS IN LEGS  . Other Sister        STOMACH ISSUES  . Depression Sister   . Lung cancer Brother        Asbestosis    Past Surgical History:  Procedure Laterality Date  . APPENDECTOMY  1950's  . CARDIAC CATHETERIZATION  10-31-2005  dr wall   severe three vessel/  perserved LV  . CARDIOVASCULAR STRESS TEST  last one 05-19-2014  dr Jens Som   low risk lexiscan no exercise study/ small inferobasal infarct with no ischemia /  ef 53%  . CORONARY ARTERY BYPASS GRAFT  11-01-2005  dr Cornelius Moras   CLOSURE OF PFO/  LIMA to LAD,  SVG to D1,  SVG to 1st & 2nd  MARGINAL branches, SVG  to PDA and posterior lateral  . CYSTOSCOPY W/ URETERAL STENT PLACEMENT Right 05/21/2014   Procedure: CYSTOSCOPY WITH RETROGRADE PYELOGRAM/URETERAL STENT PLACEMENT;  Surgeon: Kathi Ludwig, MD;  Location: [facility];  Service: Urology;  Laterality: Right;  . CYSTOSCOPY WITH URETEROSCOPY Right  05/21/2014   Procedure: CYSTOSCOPY WITH URETEROSCOPY;  Surgeon: Kathi Ludwig, MD;  Location: [facility];  Service: Urology;  Laterality: Right;  . INGUINAL HERNIA REPAIR Bilateral   . LAPAROSCOPIC CHOLECYSTECTOMY  10-02-2000  . RIGHT URETER OBSTRUCTION SURGERY  age 89  . TEE WITHOUT CARDIOVERSION N/A 07/17/2013   Procedure: TRANSESOPHAGEAL ECHOCARDIOGRAM (TEE);  Surgeon: Wendall Stade, MD;  Location: [facility] ENDOSCOPY;  Service: Cardiovascular;  Laterality: N/A;  normal  LV size, mild prolapse of posterior mitral valve leaflet,  mild MR and TR, normal AV,  no LAA thrombus,  atrial septal aneurysm with no PFO/ ASD and negative bubble study, normal RV, normal aorta with no debris   Social History   Occupational History  . Occupation: retired  Tobacco Use  . Smoking status: Former Smoker    Packs/day: 1.00    Years: 35.00    Pack years: 35.00    Types: Cigarettes    Quit date: 08/29/1977    Years since quitting: 43.1  . Smokeless tobacco: Never Used  Substance and Sexual Activity  . Alcohol use: No    Alcohol/week: 0.0 standard drinks  . Drug use: No  . Sexual activity: Not on file

## 2020-10-08 NOTE — Progress Notes (Signed)
HPI: FU CAD and atrial fibrillation. Patient underwent coronary artery bypass and graft in 2007. He had a LIMA to the LAD, saphenous vein graft to first diagonal, sequential saphenous vein graft to the first marginal and second marginal, sequential saphenous vein graft to the PDA and posterior lateral. He also had closure of PFO. Abdominal ultrasound May 2014 showed no aneurysm. Had CVA in November of 2014. Transesophageal echocardiogram November 2014 showed normal LV function, mild prolapse of the posterior mitral valve leaflet and an atrial septal aneurysm with negative bubble study. Carotid Dopplers November 2014 showed less than 40% bilateral stenosis. 30 day event monitor showed atrial fibrillation. Echocardiogram March 2017 showed normal LV systolic function, grade 1 diastolic dysfunction, moderate left atrial enlargement and mild tricuspid regurgitation. Nuclear study May 2017 showed ejection fraction 53%. There was diaphragmatic attenuation but no ischemia. Since last seen,he notes some dyspnea on exertion that he attributes to deconditioning.  He denies orthopnea, PND, pedal edema, chest pain or syncope.  Current Outpatient Medications  Medication Sig Dispense Refill  . carvedilol (COREG) 3.125 MG tablet Take 1 tablet (3.125 mg total) by mouth 2 (two) times daily with a meal. 60 tablet 12  . Cholecalciferol (VITAMIN D3 PO) Take 1,000 Units by mouth daily.    . COD LIVER OIL PO Take 1 capsule by mouth at bedtime.     Marland Kitchen ezetimibe (ZETIA) 10 MG tablet Take 10 mg by mouth daily.    . hydrochlorothiazide (HYDRODIURIL) 12.5 MG tablet Take 12.5 mg by mouth every morning.    Marland Kitchen losartan (COZAAR) 50 MG tablet Take 50 mg by mouth daily.    . Multiple Vitamin (MULTIVITAMIN WITH MINERALS) TABS tablet Take 1 tablet by mouth daily.    Marland Kitchen omeprazole (PRILOSEC OTC) 20 MG tablet Take 20 mg by mouth every other day.     . pravastatin (PRAVACHOL) 80 MG tablet Take 80 mg by mouth at bedtime.    . saw  palmetto 160 MG capsule Take 160 mg by mouth 2 (two) times daily.    . tadalafil (CIALIS) 5 MG tablet Take 5 mg by mouth at bedtime.    . TESTOSTERONE TD Place 1 mL onto the skin daily. Apply every morning to back shoulder - 10% lipoderm cream compounded at [employer]    . XARELTO 15 MG TABS tablet TAKE ONE (1) TABLET BY MOUTH EACH DAY WITH SUPPER 90 tablet 2   No current facility-administered medications for this visit.     Past Medical History:  Diagnosis Date  . At risk for sleep apnea    STOP-BANG= 5   SENT TO PCP 05-20-2014  . Bilateral carotid artery stenosis    mild bilateral proximcal ICA  40% per duplex 11/ 2014  . Coronary artery disease    a. s/p CABG in 2007 w/ LIMA-LAD, SVG-D1, SVG-1st & 2nd Mrg, SVG-PDA and posterior lateral  . ED (erectile dysfunction)   . Eye infection 12/2014  . GERD (gastroesophageal reflux disease)   . History of embolic stroke no residual   72/ 2014  --  right PCA and MCA branch infarts (left side vision difficulties)  . Hypertension   . Other malaise and fatigue   . Paroxysmal atrial fibrillation (HCC)   . Pneumonia 10/2015   led to CHF, in hospital x 6 days  . S/P CABG x 6    2007  . Ureteral obstruction, right     Past Surgical History:  Procedure Laterality Date  .  APPENDECTOMY  1950's  . CARDIAC CATHETERIZATION  10-31-2005  dr wall   severe three vessel/  perserved LV  . CARDIOVASCULAR STRESS TEST  last one 05-19-2014  dr Jens Som   low risk lexiscan no exercise study/ small inferobasal infarct with no ischemia /  ef 53%  . CORONARY ARTERY BYPASS GRAFT  11-01-2005  dr Cornelius Moras   CLOSURE OF PFO/  LIMA to LAD,  SVG to D1,  SVG to 1st & 2nd  MARGINAL branches, SVG  to PDA and posterior lateral  . CYSTOSCOPY W/ URETERAL STENT PLACEMENT Right 05/21/2014   Procedure: CYSTOSCOPY WITH RETROGRADE PYELOGRAM/URETERAL STENT PLACEMENT;  Surgeon: Kathi Ludwig, MD;  Location: [facility];  Service: Urology;  Laterality:  Right;  . CYSTOSCOPY WITH URETEROSCOPY Right 05/21/2014   Procedure: CYSTOSCOPY WITH URETEROSCOPY;  Surgeon: Kathi Ludwig, MD;  Location: [facility];  Service: Urology;  Laterality: Right;  . INGUINAL HERNIA REPAIR Bilateral   . LAPAROSCOPIC CHOLECYSTECTOMY  10-02-2000  . RIGHT URETER OBSTRUCTION SURGERY  age 40  . TEE WITHOUT CARDIOVERSION N/A 07/17/2013   Procedure: TRANSESOPHAGEAL ECHOCARDIOGRAM (TEE);  Surgeon: Wendall Stade, MD;  Location: [facility] ENDOSCOPY;  Service: Cardiovascular;  Laterality: N/A;  normal LV size, mild prolapse of posterior mitral valve leaflet,  mild MR and TR, normal AV,  no LAA thrombus,  atrial septal aneurysm with no PFO/ ASD and negative bubble study, normal RV, normal aorta with no debris    Social History   Socioeconomic History  . Marital status: Married    Spouse name: maxine  . Number of children: 4  . Years of education: college4  . Highest education level: Not on file  Occupational History  . Occupation: retired  Tobacco Use  . Smoking status: Former Smoker    Packs/day: 1.00    Years: 35.00    Pack years: 35.00    Types: Cigarettes    Quit date: 08/29/1977    Years since quitting: 43.1  . Smokeless tobacco: Never Used  Substance and Sexual Activity  . Alcohol use: No    Alcohol/week: 0.0 standard drinks  . Drug use: No  . Sexual activity: Not on file  Other Topics Concern  . Not on file  Social History Narrative   Patient is married with 4 children.   Patient is right handed.   Patient has college education.   Patient drinks decaff coffee.   Social Determinants of Health   Financial Resource Strain: Not on file  Food Insecurity: Not on file  Transportation Needs: Not on file  Physical Activity: Not on file  Stress: Not on file  Social Connections: Not on file  Intimate Partner Violence: Not on file    Family History  Problem Relation Age of Onset  . Heart disease Mother        PPM  . Hypertension Mother    . Lymphoma Father   . Prostate cancer Father   . Prostate cancer Brother   . Aneurysm Brother        REPAIR  . CAD Brother        CABG  . Other Brother        STENTS IN LEGS  . Other Sister        STOMACH ISSUES  . Depression Sister   . Lung cancer Brother        Asbestosis    ROS: no fevers or chills, productive cough, hemoptysis, dysphasia, odynophagia, melena, hematochezia, dysuria, hematuria, rash, seizure activity,  orthopnea, PND, pedal edema, claudication. Remaining systems are negative.  Physical Exam: Well-developed well-nourished in no acute distress.  Skin is warm and dry.  HEENT is normal.  Neck is supple.  Chest is clear to auscultation with normal expansion.  Cardiovascular exam is regular rate and rhythm.  Abdominal exam nontender or distended. No masses palpated. Extremities show no edema. neuro grossly intact  ECG-sinus bradycardia at a rate of 49, first-degree AV block, no ST changes.  Personally reviewed  A/P  1 coronary artery disease-patient doing well with no chest pain.  Continue medical therapy.  We will continue statin.  He is not on aspirin given need for Xarelto.  2 paroxysmal atrial fibrillation-patient remains in sinus rhythm.  We will continue beta-blocker for rate control if atrial fibrillation recurs.  Continue Xarelto.  Check hemoglobin and renal function.  3 hypertension-patient's blood pressure is controlled today.  Continue present medications and follow.  4 hyperlipidemia-continue statin.  Check lipids and liver.  5 chronic diastolic congestive heart failure-patient is euvolemic and will continue with preventive measures including fluid restriction and low-sodium diet.  Olga Millers, MD

## 2020-10-20 ENCOUNTER — Encounter: Payer: Self-pay | Admitting: Cardiology

## 2020-10-20 ENCOUNTER — Ambulatory Visit (INDEPENDENT_AMBULATORY_CARE_PROVIDER_SITE_OTHER): Payer: Medicare Other | Admitting: Cardiology

## 2020-10-20 ENCOUNTER — Other Ambulatory Visit: Payer: Self-pay

## 2020-10-20 VITALS — BP 120/66 | HR 49 | Ht 68.5 in | Wt 178.1 lb

## 2020-10-20 DIAGNOSIS — E7800 Pure hypercholesterolemia, unspecified: Secondary | ICD-10-CM | POA: Diagnosis not present

## 2020-10-20 DIAGNOSIS — I480 Paroxysmal atrial fibrillation: Secondary | ICD-10-CM

## 2020-10-20 DIAGNOSIS — I2510 Atherosclerotic heart disease of native coronary artery without angina pectoris: Secondary | ICD-10-CM

## 2020-10-20 DIAGNOSIS — I10 Essential (primary) hypertension: Secondary | ICD-10-CM

## 2020-10-20 NOTE — Patient Instructions (Signed)
°  Lab Work: ° °Your physician recommends that you return for lab work FASTING ° °If you have labs (blood work) drawn today and your tests are completely normal, you will receive your results only by: °MyChart Message (if you have MyChart) OR °A paper copy in the mail °If you have any lab test that is abnormal or we need to change your treatment, we will call you to review the results. ° ° °Follow-Up: °At [facility], you and your health needs are our priority.  As part of our continuing mission to provide you with exceptional heart care, we have created designated Provider Care Teams.  These Care Teams include your primary Cardiologist (physician) and Advanced Practice Providers (APPs -  Physician Assistants and Nurse Practitioners) who all work together to provide you with the care you need, when you need it. ° °We recommend signing up for the patient portal called "MyChart".  Sign up information is provided on this After Visit Summary.  MyChart is used to connect with patients for Virtual Visits (Telemedicine).  Patients are able to view lab/test results, encounter notes, upcoming appointments, etc.  Non-urgent messages can be sent to your provider as well.   °To learn more about what you can do with MyChart, go to [email or web address].   ° °Your next appointment:   °6 month(s) ° °The format for your next appointment:   °In Person ° °Provider:   °Brian Crenshaw, MD   ° ° °

## 2020-10-22 LAB — CBC
Hematocrit: 49.6 % (ref 37.5–51.0)
Hemoglobin: 16.7 g/dL (ref 13.0–17.7)
MCH: 31.2 pg (ref 26.6–33.0)
MCHC: 33.7 g/dL (ref 31.5–35.7)
MCV: 93 fL (ref 79–97)
Platelets: 116 10*3/uL — ABNORMAL LOW (ref 150–450)
RBC: 5.35 x10E6/uL (ref 4.14–5.80)
RDW: 13.5 % (ref 11.6–15.4)
WBC: 5.2 10*3/uL (ref 3.4–10.8)

## 2020-10-22 LAB — COMPREHENSIVE METABOLIC PANEL
ALT: 19 IU/L (ref 0–44)
AST: 19 IU/L (ref 0–40)
Albumin/Globulin Ratio: 1.3 (ref 1.2–2.2)
Albumin: 4.3 g/dL (ref 3.6–4.6)
Alkaline Phosphatase: 102 IU/L (ref 44–121)
BUN/Creatinine Ratio: 14 (ref 10–24)
BUN: 25 mg/dL (ref 8–27)
Bilirubin Total: 0.7 mg/dL (ref 0.0–1.2)
CO2: 24 mmol/L (ref 20–29)
Calcium: 9.8 mg/dL (ref 8.6–10.2)
Chloride: 100 mmol/L (ref 96–106)
Creatinine, Ser: 1.83 mg/dL — ABNORMAL HIGH (ref 0.76–1.27)
GFR calc Af Amer: 38 mL/min/{1.73_m2} — ABNORMAL LOW (ref >59)
GFR calc non Af Amer: 32 mL/min/{1.73_m2} — ABNORMAL LOW (ref >59)
Globulin, Total: 3.3 g/dL (ref 1.5–4.5)
Glucose: 100 mg/dL — ABNORMAL HIGH (ref 65–99)
Potassium: 4.3 mmol/L (ref 3.5–5.2)
Sodium: 141 mmol/L (ref 134–144)
Total Protein: 7.6 g/dL (ref 6.0–8.5)

## 2020-10-22 LAB — LIPID PANEL
Chol/HDL Ratio: 2.8 ratio (ref 0.0–5.0)
Cholesterol, Total: 106 mg/dL (ref 100–199)
HDL: 38 mg/dL — ABNORMAL LOW (ref >39)
LDL Chol Calc (NIH): 49 mg/dL (ref 0–99)
Triglycerides: 101 mg/dL (ref 0–149)
VLDL Cholesterol Cal: 19 mg/dL (ref 5–40)

## 2020-12-27 ENCOUNTER — Encounter: Payer: Self-pay | Admitting: Orthopedic Surgery

## 2020-12-27 ENCOUNTER — Ambulatory Visit (INDEPENDENT_AMBULATORY_CARE_PROVIDER_SITE_OTHER): Payer: Medicare Other | Admitting: Physician Assistant

## 2020-12-27 DIAGNOSIS — B351 Tinea unguium: Secondary | ICD-10-CM | POA: Diagnosis not present

## 2020-12-27 NOTE — Progress Notes (Signed)
Office Visit Note   Patient: Jose [name]           Date of Birth: 08-Nov-1930           MRN: [id number] Visit Date: 12/27/2020              Requested by: Mosetta Putt, MD [address] [place],  [place] [address] PCP: Mosetta Putt, MD  Chief Complaint  Patient presents with  . Left Foot - Follow-up  . Right Foot - Follow-up      HPI: Patient presents today for bilateral feet onychomycosis and was requesting a nail trim he has no other concerns  Assessment & Plan: Visit Diagnoses: No diagnosis found.  Plan: Continue good foot hygiene we will follow-up in 2 to 3 months for repeat treatment  Follow-Up Instructions: No follow-ups on file.   Ortho Exam  Patient is alert, oriented, no adenopathy, well-dressed, normal affect, normal respiratory effort. Bilateral feet onychomycotic nails x10 otherwise feet are in excellent condition without any open ulcer or swelling  Imaging: No results found. No images are attached to the encounter.  Labs: Lab Results  Component Value Date   HGBA1C 6.2 (H) 07/10/2013   ESRSEDRATE 12 01/13/2016   ESRSEDRATE 20 12/21/2015   ESRSEDRATE 94 (H) 11/26/2015   REPTSTATUS 11/24/2015 FINAL 11/24/2015   REPTSTATUS 11/28/2015 FINAL 11/24/2015   GRAMSTAIN  11/24/2015    ABUNDANT WBC PRESENT,BOTH PMN AND MONONUCLEAR FEW SQUAMOUS EPITHELIAL CELLS PRESENT MODERATE GRAM POSITIVE COCCI IN PAIRS MODERATE GRAM NEGATIVE RODS Performed at [facility]    CULT  11/24/2015    NORMAL OROPHARYNGEAL FLORA Performed at [facility]      Lab Results  Component Value Date   ALBUMIN 4.3 10/21/2020   ALBUMIN 4.0 10/22/2019   ALBUMIN 4.2 04/22/2014    Lab Results  Component Value Date   MG 1.6 (L) 11/22/2015   No results found for: VD25OH  No results found for: PREALBUMIN CBC EXTENDED Latest Ref Rng & Units 10/21/2020 10/22/2019 12/21/2015  WBC 3.4 - 10.8 x10E3/uL 5.2 5.6 10.1  RBC 4.14 - 5.80 x10E6/uL 5.35 5.20 4.74   HGB 13.0 - 17.7 g/dL 09.3 23.5 57.3  HCT 22.0 - 51.0 % 49.6 47.2 42.7  PLT 150 - 450 x10E3/uL 116(L) 113(L) 164  NEUTROABS 1,500 - 7,800 cells/uL - - 9,090(H)  LYMPHSABS 850 - 3,900 cells/uL - - 808(L)     There is no height or weight on file to calculate BMI.  Orders:  No orders of the defined types were placed in this encounter.  No orders of the defined types were placed in this encounter.    Procedures: No procedures performed  Clinical Data: No additional findings.  ROS:  All other systems negative, except as noted in the HPI. Review of Systems  Objective: Vital Signs: There were no vitals taken for this visit.  Specialty Comments:  No specialty comments available.  PMFS History: Patient Active Problem List   Diagnosis Date Noted  . History of stroke 01/05/2016  . Chronic respiratory failure (HCC) 12/16/2015  . HSV-1 (herpes simplex virus 1) infection 12/16/2015  . Interstitial lung disease (HCC) 11/27/2015  . BOOP (bronchiolitis obliterans with organizing pneumonia) (HCC) 11/27/2015  . Elevated troponin 11/27/2015  . Hydronephrosis, right 11/27/2015  . SOB (shortness of breath)   . Acute respiratory failure with hypoxia (HCC) 11/26/2015  . Chronic kidney disease (CKD), stage III (moderate) (HCC) 11/26/2015  . Acute on chronic diastolic (congestive) heart failure (HCC)   .  Lobar pneumonia due to unspecified organism   . Acute on chronic combined systolic (congestive) and diastolic (congestive) heart failure (HCC) 11/22/2015  . Atrial fibrillation (HCC) 09/10/2013  . 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 Visual field loss 07/09/2013  . HYPERLIPIDEMIA-MIXED 12/23/2009  . CAD, NATIVE VESSEL 12/15/2009  . ERECTILE DYSFUNCTION 12/10/2008  . Essential hypertension 12/10/2008  . FATIGUE 12/10/2008   Past Medical History:  Diagnosis Date  . At risk for sleep apnea    STOP-BANG= 5   SENT TO PCP 05-20-2014  .  Bilateral carotid artery stenosis    mild bilateral proximcal ICA  40% per duplex 11/ 2014  . Coronary artery disease    a. s/p CABG in 2007 w/ LIMA-LAD, SVG-D1, SVG-1st & 2nd Mrg, SVG-PDA and posterior lateral  . ED (erectile dysfunction)   . Eye infection 12/2014  . GERD (gastroesophageal reflux disease)   . History of embolic stroke no residual   17/ 2014  --  right PCA and MCA branch infarts (left side vision difficulties)  . Hypertension   . Other malaise and fatigue   . Paroxysmal atrial fibrillation (HCC)   . Pneumonia 10/2015   led to CHF, in hospital x 6 days  . S/P CABG x 6    2007  . Ureteral obstruction, right     Family History  Problem Relation Age of Onset  . Heart disease Mother        PPM  . Hypertension Mother   . Lymphoma Father   . Prostate cancer Father   . Prostate cancer Brother   . Aneurysm Brother        REPAIR  . CAD Brother        CABG  . Other Brother        STENTS IN LEGS  . Other Sister        STOMACH ISSUES  . Depression Sister   . Lung cancer Brother        Asbestosis    Past Surgical History:  Procedure Laterality Date  . APPENDECTOMY  1950's  . CARDIAC CATHETERIZATION  10-31-2005  dr wall   severe three vessel/  perserved LV  . CARDIOVASCULAR STRESS TEST  last one 05-19-2014  dr Jens Som   low risk lexiscan no exercise study/ small inferobasal infarct with no ischemia /  ef 53%  . CORONARY ARTERY BYPASS GRAFT  11-01-2005  dr Cornelius Moras   CLOSURE OF PFO/  LIMA to LAD,  SVG to D1,  SVG to 1st & 2nd  MARGINAL branches, SVG  to PDA and posterior lateral  . CYSTOSCOPY W/ URETERAL STENT PLACEMENT Right 05/21/2014   Procedure: CYSTOSCOPY WITH RETROGRADE PYELOGRAM/URETERAL STENT PLACEMENT;  Surgeon: Kathi Ludwig, MD;  Location: [facility];  Service: Urology;  Laterality: Right;  . CYSTOSCOPY WITH URETEROSCOPY Right 05/21/2014   Procedure: CYSTOSCOPY WITH URETEROSCOPY;  Surgeon: Kathi Ludwig, MD;  Location: [facility];  Service: Urology;  Laterality: Right;  . INGUINAL HERNIA REPAIR Bilateral   . LAPAROSCOPIC CHOLECYSTECTOMY  10-02-2000  . RIGHT URETER OBSTRUCTION SURGERY  age 34  . TEE WITHOUT CARDIOVERSION N/A 07/17/2013   Procedure: TRANSESOPHAGEAL ECHOCARDIOGRAM (TEE);  Surgeon: Wendall Stade, MD;  Location: [facility] ENDOSCOPY;  Service: Cardiovascular;  Laterality: N/A;  normal LV size, mild prolapse of posterior mitral valve leaflet,  mild MR and TR, normal AV,  no LAA thrombus,  atrial septal aneurysm with no PFO/ ASD and negative  bubble study, normal RV, normal aorta with no debris   Social History   Occupational History  . Occupation: retired  Tobacco Use  . Smoking status: Former Smoker    Packs/day: 1.00    Years: 35.00    Pack years: 35.00    Types: Cigarettes    Quit date: 08/29/1977    Years since quitting: 43.3  . Smokeless tobacco: Never Used  Substance and Sexual Activity  . Alcohol use: No    Alcohol/week: 0.0 standard drinks  . Drug use: No  . Sexual activity: Not on file

## 2021-03-07 ENCOUNTER — Ambulatory Visit: Payer: PRIVATE HEALTH INSURANCE | Admitting: Orthopedic Surgery

## 2021-03-14 ENCOUNTER — Ambulatory Visit: Payer: PRIVATE HEALTH INSURANCE | Admitting: Orthopedic Surgery

## 2021-03-14 ENCOUNTER — Ambulatory Visit (INDEPENDENT_AMBULATORY_CARE_PROVIDER_SITE_OTHER): Payer: Medicare Other | Admitting: Physician Assistant

## 2021-03-14 ENCOUNTER — Encounter: Payer: Self-pay | Admitting: Orthopedic Surgery

## 2021-03-14 DIAGNOSIS — I2510 Atherosclerotic heart disease of native coronary artery without angina pectoris: Secondary | ICD-10-CM

## 2021-03-14 DIAGNOSIS — B351 Tinea unguium: Secondary | ICD-10-CM | POA: Diagnosis not present

## 2021-03-14 NOTE — Progress Notes (Signed)
Office Visit Note   Patient: Jose [name]           Date of Birth: 15-Jul-1931           MRN: [id number] Visit Date: 03/14/2021              Requested by: Mosetta Putt, MD [address] [place],  [place] [address] PCP: Mosetta Putt, MD  Chief Complaint  Patient presents with   Right Foot - Nail Problem   Left Foot - Nail Problem      HPI: Patient comes in today for periodic nail trim.  He has a history of onychomycotic nails.  No other concerns.  Assessment & Plan: Visit Diagnoses: No diagnosis found.  Plan: Onychomycotic nails were trimmed x10 to stable surfaces we will follow-up in 3 months  Follow-Up Instructions: No follow-ups on file.   Ortho Exam  Patient is alert, oriented, no adenopathy, well-dressed, normal affect, normal respiratory effort. Examination bilateral feet no swelling no cellulitis he has onychomycotic nails.  No signs of infection after verbal consent these were trimmed to stable surfaces  Imaging: No results found. No images are attached to the encounter.  Labs: Lab Results  Component Value Date   HGBA1C 6.2 (H) 07/10/2013   ESRSEDRATE 12 01/13/2016   ESRSEDRATE 20 12/21/2015   ESRSEDRATE 94 (H) 11/26/2015   REPTSTATUS 11/24/2015 FINAL 11/24/2015   REPTSTATUS 11/28/2015 FINAL 11/24/2015   GRAMSTAIN  11/24/2015    ABUNDANT WBC PRESENT,BOTH PMN AND MONONUCLEAR FEW SQUAMOUS EPITHELIAL CELLS PRESENT MODERATE GRAM POSITIVE COCCI IN PAIRS MODERATE GRAM NEGATIVE RODS Performed at [facility]    CULT  11/24/2015    NORMAL OROPHARYNGEAL FLORA Performed at [facility]      Lab Results  Component Value Date   ALBUMIN 4.3 10/21/2020   ALBUMIN 4.0 10/22/2019   ALBUMIN 4.2 04/22/2014    Lab Results  Component Value Date   MG 1.6 (L) 11/22/2015   No results found for: VD25OH  No results found for: PREALBUMIN CBC EXTENDED Latest Ref Rng & Units 10/21/2020 10/22/2019 12/21/2015  WBC 3.4 - 10.8 x10E3/uL  5.2 5.6 10.1  RBC 4.14 - 5.80 x10E6/uL 5.35 5.20 4.74  HGB 13.0 - 17.7 g/dL 85.0 27.7 41.2  HCT 87.8 - 51.0 % 49.6 47.2 42.7  PLT 150 - 450 x10E3/uL 116(L) 113(L) 164  NEUTROABS 1,500 - 7,800 cells/uL - - 9,090(H)  LYMPHSABS 850 - 3,900 cells/uL - - 808(L)     There is no height or weight on file to calculate BMI.  Orders:  No orders of the defined types were placed in this encounter.  No orders of the defined types were placed in this encounter.    Procedures: No procedures performed  Clinical Data: No additional findings.  ROS:  All other systems negative, except as noted in the HPI. Review of Systems  Objective: Vital Signs: There were no vitals taken for this visit.  Specialty Comments:  No specialty comments available.  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   Elevated troponin 11/27/2015   Hydronephrosis, right 11/27/2015   SOB (shortness of breath)    Acute respiratory failure with hypoxia (HCC) 11/26/2015   Chronic kidney disease (CKD), stage III (moderate) (  HCC) 11/26/2015   Acute on chronic diastolic (congestive) heart failure (HCC)    Lobar pneumonia due to unspecified organism    Acute on chronic combined systolic (congestive) and diastolic (congestive) heart failure (HCC) 11/22/2015   Atrial fibrillation (HCC) 09/10/2013   Occlusion and stenosis of vertebral artery with cerebral infarction (HCC) 07/09/2013   Visual field loss 07/09/2013   HYPERLIPIDEMIA-MIXED 12/23/2009   CAD, NATIVE VESSEL 12/15/2009   ERECTILE DYSFUNCTION 12/10/2008   Essential hypertension 12/10/2008   FATIGUE 12/10/2008   Past Medical History:  Diagnosis Date   At risk for sleep apnea     STOP-BANG= 5   SENT TO PCP 05-20-2014   Bilateral carotid artery stenosis    mild bilateral proximcal ICA  40% per duplex 11/ 2014   Coronary artery disease    a. s/p CABG in 2007 w/ LIMA-LAD, SVG-D1, SVG-1st & 2nd Mrg, SVG-PDA and posterior lateral   ED (erectile dysfunction)    Eye infection 12/2014   GERD (gastroesophageal reflux disease)    History of embolic stroke no residual   00/ 2014  --  right PCA and MCA branch infarts (left side vision difficulties)   Hypertension    Other malaise and fatigue    Paroxysmal atrial fibrillation (HCC)    Pneumonia 10/2015   led to CHF, in hospital x 6 days   S/P CABG x 6    2007   Ureteral obstruction, right     Family History  Problem Relation Age of Onset   Heart disease Mother        PPM   Hypertension Mother    Lymphoma Father    Prostate cancer Father    Prostate cancer Brother    Aneurysm Brother        REPAIR   CAD Brother        CABG   Other Brother        STENTS IN LEGS   Other Sister        STOMACH ISSUES   Depression Sister    Lung cancer Brother        Asbestosis    Past Surgical History:  Procedure Laterality Date   APPENDECTOMY  1950's   CARDIAC CATHETERIZATION  10-31-2005  dr wall   severe three vessel/  perserved LV   CARDIOVASCULAR STRESS TEST  last one 05-19-2014  dr Jens Som   low risk lexiscan no exercise study/ small inferobasal infarct with no ischemia /  ef 53%   CORONARY ARTERY BYPASS GRAFT  11-01-2005  dr Cornelius Moras   CLOSURE OF PFO/  LIMA to LAD,  SVG to D1,  SVG to 1st & 2nd  MARGINAL branches, SVG  to PDA and posterior lateral   CYSTOSCOPY W/ URETERAL STENT PLACEMENT Right 05/21/2014   Procedure: CYSTOSCOPY WITH RETROGRADE PYELOGRAM/URETERAL STENT PLACEMENT;  Surgeon: Kathi Ludwig, MD;  Location: [facility];  Service: Urology;  Laterality: Right;   CYSTOSCOPY WITH URETEROSCOPY Right 05/21/2014   Procedure: CYSTOSCOPY WITH URETEROSCOPY;  Surgeon: Kathi Ludwig, MD;  Location:  [facility];  Service: Urology;  Laterality: Right;   INGUINAL HERNIA REPAIR Bilateral    LAPAROSCOPIC CHOLECYSTECTOMY  10-02-2000   RIGHT URETER OBSTRUCTION SURGERY  age 89   TEE WITHOUT CARDIOVERSION N/A 07/17/2013   Procedure: TRANSESOPHAGEAL ECHOCARDIOGRAM (TEE);  Surgeon: Wendall Stade, MD;  Location: [facility] ENDOSCOPY;  Service: Cardiovascular;  Laterality: N/A;  normal LV size, mild prolapse of posterior mitral valve leaflet,  mild MR and TR, normal  AV,  no LAA thrombus,  atrial septal aneurysm with no PFO/ ASD and negative bubble study, normal RV, normal aorta with no debris   Social History   Occupational History   Occupation: retired  Tobacco Use   Smoking status: Former    Packs/day: 1.00    Years: 35.00    Pack years: 35.00    Types: Cigarettes    Quit date: 08/29/1977    Years since quitting: 43.5   Smokeless tobacco: Never  Substance and Sexual Activity   Alcohol use: No    Alcohol/week: 0.0 standard drinks   Drug use: No   Sexual activity: Not on file

## 2021-05-16 ENCOUNTER — Ambulatory Visit: Payer: PRIVATE HEALTH INSURANCE | Admitting: Orthopedic Surgery

## 2021-07-15 ENCOUNTER — Ambulatory Visit (INDEPENDENT_AMBULATORY_CARE_PROVIDER_SITE_OTHER): Payer: Medicare Other | Admitting: Family

## 2021-07-15 ENCOUNTER — Other Ambulatory Visit: Payer: Self-pay

## 2021-07-15 ENCOUNTER — Encounter: Payer: Self-pay | Admitting: Family

## 2021-07-15 DIAGNOSIS — I2510 Atherosclerotic heart disease of native coronary artery without angina pectoris: Secondary | ICD-10-CM | POA: Diagnosis not present

## 2021-07-15 DIAGNOSIS — B351 Tinea unguium: Secondary | ICD-10-CM | POA: Diagnosis not present

## 2021-07-15 NOTE — Progress Notes (Signed)
Office Visit Note   Patient: Jose [name]           Date of Birth: [birth year]           MRN: [id number] Visit Date: 07/15/2021              Requested by: Mosetta Putt, MD [address] [place],  [place] [address] PCP: Mosetta Putt, MD  No chief complaint on file.     HPI: The patient is an 87 year old gentleman who presents today for bilateral foot evaluation.  Unfortunately he is unable to safely trim his own nails.  He was last seen in the office in July.    He has a history of onychomycotic nails.  No other concerns.  Assessment & Plan: Visit Diagnoses: No diagnosis found.  Plan: Onychomycotic nails were trimmed x10.  Patient tolerated well.  We will follow-up in 3 months  Follow-Up Instructions: No follow-ups on file.   Ortho Exam  Patient is alert, oriented, no adenopathy, well-dressed, normal affect, normal respiratory effort. Examination bilateral feet no swelling no cellulitis he has thickened and discolored onychomycotic nails x10.  Patient is unable to safely trim his own nails today in the office x10.  Imaging: No results found. No images are attached to the encounter.  Labs: Lab Results  Component Value Date   HGBA1C 6.2 (H) 07/10/2013   ESRSEDRATE 12 01/13/2016   ESRSEDRATE 20 12/21/2015   ESRSEDRATE 94 (H) 11/26/2015   REPTSTATUS 11/24/2015 FINAL 11/24/2015   REPTSTATUS 11/28/2015 FINAL 11/24/2015   GRAMSTAIN  11/24/2015    ABUNDANT WBC PRESENT,BOTH PMN AND MONONUCLEAR FEW SQUAMOUS EPITHELIAL CELLS PRESENT MODERATE GRAM POSITIVE COCCI IN PAIRS MODERATE GRAM NEGATIVE RODS Performed at [facility]    CULT  11/24/2015    NORMAL OROPHARYNGEAL FLORA Performed at [facility]      Lab Results  Component Value Date   ALBUMIN 4.3 10/21/2020   ALBUMIN 4.0 10/22/2019   ALBUMIN 4.2 04/22/2014    Lab Results  Component Value Date   MG 1.6 (L) 11/22/2015   No results found for: VD25OH  No results found for:  PREALBUMIN CBC EXTENDED Latest Ref Rng & Units 10/21/2020 10/22/2019 12/21/2015  WBC 3.4 - 10.8 x10E3/uL 5.2 5.6 10.1  RBC 4.14 - 5.80 x10E6/uL 5.35 5.20 4.74  HGB 13.0 - 17.7 g/dL 93.2 67.1 24.5  HCT 80.9 - 51.0 % 49.6 47.2 42.7  PLT 150 - 450 x10E3/uL 116(L) 113(L) 164  NEUTROABS 1,500 - 7,800 cells/uL - - 9,090(H)  LYMPHSABS 850 - 3,900 cells/uL - - 808(L)     There is no height or weight on file to calculate BMI.  Orders:  No orders of the defined types were placed in this encounter.  No orders of the defined types were placed in this encounter.    Procedures: No procedures performed  Clinical Data: No additional findings.  ROS:  All other systems negative, except as noted in the HPI. Review of Systems  Objective: Vital Signs: There were no vitals taken for this visit.  Specialty Comments:  No specialty comments available.  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   Elevated troponin 11/27/2015   Hydronephrosis, right 11/27/2015   SOB (shortness of breath)  Acute respiratory failure with hypoxia ([place]) 11/26/2015   Chronic kidney disease (CKD), stage III (moderate) (HCC) 11/26/2015   Acute on chronic diastolic (congestive) heart failure (HCC)    Lobar pneumonia due to unspecified organism    Acute on chronic combined systolic (congestive) and diastolic (congestive) heart failure ([place]) 11/22/2015   Atrial fibrillation ([place]) 09/10/2013   Occlusion and stenosis of vertebral artery with cerebral infarction ([place]) 07/09/2013   Visual field loss 07/09/2013   HYPERLIPIDEMIA-MIXED 12/23/2009   CAD, NATIVE VESSEL 12/15/2009   ERECTILE DYSFUNCTION 12/10/2008   Essential hypertension 12/10/2008    FATIGUE 12/10/2008   Past Medical History:  Diagnosis Date   At risk for sleep apnea    STOP-BANG= 5   SENT TO PCP 05-20-2014   Bilateral carotid artery stenosis    mild bilateral proximcal ICA  40% per duplex 11/ 2014   Coronary artery disease    a. s/p CABG in 2007 w/ LIMA-LAD, SVG-D1, SVG-1st & 2nd Mrg, SVG-PDA and posterior lateral   ED (erectile dysfunction)    Eye infection 12/2014   GERD (gastroesophageal reflux disease)    History of embolic stroke no residual   11/ 2014  --  right PCA and MCA branch infarts (left side vision difficulties)   Hypertension    Other malaise and fatigue    Paroxysmal atrial fibrillation ([place])    Pneumonia 10/2015   led to CHF, in hospital x 6 days   S/P CABG x 6    2007   Ureteral obstruction, right     Family History  Problem Relation Age of Onset   Heart disease Mother        PPM   Hypertension Mother    Lymphoma Father    Prostate cancer Father    Prostate cancer Brother    Aneurysm Brother        REPAIR   CAD Brother        CABG   Other Brother        STENTS IN LEGS   Other Sister        STOMACH ISSUES   Depression Sister    Lung cancer Brother        Asbestosis    Past Surgical History:  Procedure Laterality Date   APPENDECTOMY  1950's   CARDIAC CATHETERIZATION  10-31-2005  dr wall   severe three vessel/  perserved LV   CARDIOVASCULAR STRESS TEST  last one 05-19-2014  dr Stanford Breed   low risk lexiscan no exercise study/ small inferobasal infarct with no ischemia /  ef 53%   CORONARY ARTERY BYPASS GRAFT  11-01-2005  dr Roxy Manns   CLOSURE OF PFO/  LIMA to LAD,  SVG to D1,  SVG to 1st & 2nd  MARGINAL branches, SVG  to PDA and posterior lateral   CYSTOSCOPY W/ URETERAL STENT PLACEMENT Right 05/21/2014   Procedure: CYSTOSCOPY WITH RETROGRADE PYELOGRAM/URETERAL STENT PLACEMENT;  Surgeon: Ailene Rud, MD;  Location: [facility];  Service: Urology;  Laterality: Right;   CYSTOSCOPY WITH URETEROSCOPY Right  05/21/2014   Procedure: CYSTOSCOPY WITH URETEROSCOPY;  Surgeon: Ailene Rud, MD;  Location: [facility];  Service: Urology;  Laterality: Right;   INGUINAL HERNIA REPAIR Bilateral    LAPAROSCOPIC CHOLECYSTECTOMY  10-02-2000   RIGHT URETER OBSTRUCTION SURGERY  age 59   TEE WITHOUT CARDIOVERSION N/A 07/17/2013   Procedure: TRANSESOPHAGEAL ECHOCARDIOGRAM (TEE);  Surgeon: Josue Hector, MD;  Location: [facility];  Service: Cardiovascular;  Laterality: N/A;  normal LV size, mild prolapse of posterior mitral valve leaflet,  mild MR and TR, normal AV,  no LAA thrombus,  atrial septal aneurysm with no PFO/ ASD and negative bubble study, normal RV, normal aorta with no debris   Social History   Occupational History   Occupation: retired  Tobacco Use   Smoking status: Former    Packs/day: 1.00    Years: 35.00    Pack years: 35.00    Types: Cigarettes    Quit date: 08/29/1977    Years since quitting: 43.9   Smokeless tobacco: Never  Substance and Sexual Activity   Alcohol use: No    Alcohol/week: 0.0 standard drinks   Drug use: No   Sexual activity: Not on file

## 2021-09-13 NOTE — H&P (View-Only) (Signed)
HPI:FU CAD and atrial fibrillation. Patient underwent coronary artery bypass and graft in 2007. He had a LIMA to the LAD, saphenous vein graft to first diagonal, sequential saphenous vein graft to the first marginal and second marginal, sequential saphenous vein graft to the PDA and posterior lateral. He also had closure of PFO. Abdominal ultrasound May 2014 showed no aneurysm. Had CVA in November of 2014. Transesophageal echocardiogram November 2014 showed normal LV function, mild prolapse of the posterior mitral valve leaflet and an atrial septal aneurysm with negative bubble study. Carotid Dopplers November 2014 showed less than 40% bilateral stenosis. 30 day event monitor showed atrial fibrillation. Echocardiogram March 2017 showed normal LV systolic function, grade 1 diastolic dysfunction, moderate left atrial enlargement and mild tricuspid regurgitation. Nuclear study May 2017 showed ejection fraction 53%.  There was diaphragmatic attenuation but no ischemia.  Since last seen, over the past 2 to 3 months he has noticed increased dyspnea on exertion but no orthopnea or PND.  He also has noticed increased bilateral lower extremity edema.  He denies chest pain, palpitations.  No bleeding.  Several weeks ago he was eating a bagel and became choked followed by syncope.  He was unconscious for several seconds by his report.  No preceding palpitations, chest pain or dyspnea.  Current Outpatient Medications  Medication Sig Dispense Refill   carvedilol (COREG) 3.125 MG tablet Take 1 tablet (3.125 mg total) by mouth 2 (two) times daily with a meal. 60 tablet 12   Cholecalciferol (VITAMIN D3 PO) Take 1,000 Units by mouth daily.     COD LIVER OIL PO Take 1 capsule by mouth at bedtime.      ezetimibe (ZETIA) 10 MG tablet Take 10 mg by mouth daily.     hydrochlorothiazide (HYDRODIURIL) 12.5 MG tablet Take 12.5 mg by mouth every morning.     losartan (COZAAR) 50 MG tablet Take 50 mg by mouth daily.      Multiple Vitamin (MULTIVITAMIN WITH MINERALS) TABS tablet Take 1 tablet by mouth daily.     omeprazole (PRILOSEC OTC) 20 MG tablet Take 20 mg by mouth every other day.      pravastatin (PRAVACHOL) 80 MG tablet Take 80 mg by mouth at bedtime.     saw palmetto 160 MG capsule Take 160 mg by mouth 2 (two) times daily.     tadalafil (CIALIS) 5 MG tablet Take 5 mg by mouth at bedtime.     TESTOSTERONE TD Place 1 mL onto the skin daily. Apply every morning to back shoulder - 10% lipoderm cream compounded at [facility] 15 MG TABS tablet TAKE ONE (1) TABLET BY MOUTH EACH DAY WITH SUPPER 90 tablet 2   No current facility-administered medications for this visit.     Past Medical History:  Diagnosis Date   At risk for sleep apnea    STOP-BANG= 5   SENT TO PCP 05-20-2014   Bilateral carotid artery stenosis    mild bilateral proximcal ICA  40% per duplex 11/ 2014   Coronary artery disease    a. s/p CABG in 2007 w/ LIMA-LAD, SVG-D1, SVG-1st & 2nd Mrg, SVG-PDA and posterior lateral   ED (erectile dysfunction)    Eye infection 12/2014   GERD (gastroesophageal reflux disease)    History of embolic stroke no residual   11/ 2014  --  right PCA and MCA branch infarts (left side vision difficulties)   Hypertension    Other malaise and  fatigue    Paroxysmal atrial fibrillation ([place])    Pneumonia 10/2015   led to CHF, in hospital x 6 days   S/P CABG x 6    2007   Ureteral obstruction, right     Past Surgical History:  Procedure Laterality Date   APPENDECTOMY  1950's   CARDIAC CATHETERIZATION  10-31-2005  dr wall   severe three vessel/  perserved LV   CARDIOVASCULAR STRESS TEST  last one 05-19-2014  dr Stanford Breed   low risk lexiscan no exercise study/ small inferobasal infarct with no ischemia /  ef 53%   CORONARY ARTERY BYPASS GRAFT  11-01-2005  dr Roxy Manns   CLOSURE OF PFO/  LIMA to LAD,  SVG to D1,  SVG to 1st & 2nd  MARGINAL branches, SVG  to PDA and posterior lateral   CYSTOSCOPY  W/ URETERAL STENT PLACEMENT Right 05/21/2014   Procedure: CYSTOSCOPY WITH RETROGRADE PYELOGRAM/URETERAL STENT PLACEMENT;  Surgeon: Ailene Rud, MD;  Location: [facility];  Service: Urology;  Laterality: Right;   CYSTOSCOPY WITH URETEROSCOPY Right 05/21/2014   Procedure: CYSTOSCOPY WITH URETEROSCOPY;  Surgeon: Ailene Rud, MD;  Location: [facility];  Service: Urology;  Laterality: Right;   INGUINAL HERNIA REPAIR Bilateral    LAPAROSCOPIC CHOLECYSTECTOMY  10-02-2000   RIGHT URETER OBSTRUCTION SURGERY  age 67   TEE WITHOUT CARDIOVERSION N/A 07/17/2013   Procedure: TRANSESOPHAGEAL ECHOCARDIOGRAM (TEE);  Surgeon: Josue Hector, MD;  Location: [facility];  Service: Cardiovascular;  Laterality: N/A;  normal LV size, mild prolapse of posterior mitral valve leaflet,  mild MR and TR, normal AV,  no LAA thrombus,  atrial septal aneurysm with no PFO/ ASD and negative bubble study, normal RV, normal aorta with no debris    Social History   Socioeconomic History   Marital status: Married    Spouse name: maxine   Number of children: 4   Years of education: college4   Highest education level: Not on file  Occupational History   Occupation: retired  Tobacco Use   Smoking status: Former    Packs/day: 1.00    Years: 35.00    Pack years: 35.00    Types: Cigarettes    Quit date: 08/29/1977    Years since quitting: 44.0   Smokeless tobacco: Never  Substance and Sexual Activity   Alcohol use: No    Alcohol/week: 0.0 standard drinks   Drug use: No   Sexual activity: Not on file  Other Topics Concern   Not on file  Social History Narrative   Patient is married with 4 children.   Patient is right handed.   Patient has college education.   Patient drinks decaff coffee.   Social Determinants of Health   Financial Resource Strain: Not on file  Food Insecurity: Not on file  Transportation Needs: Not on file  Physical Activity: Not on file  Stress:  Not on file  Social Connections: Not on file  Intimate Partner Violence: Not on file    Family History  Problem Relation Age of Onset   Heart disease Mother        PPM   Hypertension Mother    Lymphoma Father    Prostate cancer Father    Prostate cancer Brother    Aneurysm Brother        REPAIR   CAD Brother        CABG   Other Brother        STENTS IN LEGS  Other Sister        STOMACH ISSUES   Depression Sister    Lung cancer Brother        Asbestosis    ROS: no fevers or chills, productive cough, hemoptysis, dysphasia, odynophagia, melena, hematochezia, dysuria, hematuria, rash, seizure activity, orthopnea, PND, pedal edema, claudication. Remaining systems are negative.  Physical Exam: Well-developed well-nourished in no acute distress.  Skin is warm and dry.  HEENT is normal.  Neck is supple.  Chest is clear to auscultation with normal expansion.  Cardiovascular exam is bradycardic and irregular Abdominal exam nontender or distended. No masses palpated. Extremities show 1+ edema. neuro grossly intact  ECG-atrial fibrillation with PVCs or aberrantly conducted beats, nonspecific ST changes.  Personally reviewed  A/P  1 coronary artery disease-patient denies chest pain.  Continue statin.  No aspirin given need for anticoagulation.  2 paroxysmal atrial fibrillation-patient has developed recurrent atrial fibrillation and this is likely the cause of his increasing CHF symptoms.  His heart rate is slow.  Discontinue carvedilol.  Continue Xarelto.  Given that he is symptomatic I would like to see if he would maintain sinus rhythm.  He has not missed any doses of Xarelto.  We will plan to proceed with elective cardioversion.  Repeat echocardiogram.  We will also check hemoglobin and renal function.  If he does not hold sinus rhythm options may be somewhat difficult.  He has baseline renal insufficiency and therefore Tikosyn likely not an option.  Flecainide would not be an  option given baseline coronary disease.  Amiodarone would likely exacerbate bradycardia.  If atrial fibrillation recurs would refer to electrophysiology for options.  3 chronic diastolic congestive heart failure-patient is volume overloaded on examination.  Discontinue hydrochlorothiazide.  Treat with Lasix 40 mg daily.  In 1 week check potassium and renal function.  4 hypertension-blood pressure controlled.  However he is bradycardic.  Discontinue carvedilol and follow blood pressure.  5 hyperlipidemia-continue pravastatin and zetia.  Check lipids and liver.  Kirk Ruths, MD

## 2021-09-13 NOTE — Progress Notes (Signed)
HPI:FU CAD and atrial fibrillation. Patient underwent coronary artery bypass and graft in 2007. He had a LIMA to the LAD, saphenous vein graft to first diagonal, sequential saphenous vein graft to the first marginal and second marginal, sequential saphenous vein graft to the PDA and posterior lateral. He also had closure of PFO. Abdominal ultrasound May 2014 showed no aneurysm. Had CVA in November of 2014. Transesophageal echocardiogram November 2014 showed normal LV function, mild prolapse of the posterior mitral valve leaflet and an atrial septal aneurysm with negative bubble study. Carotid Dopplers November 2014 showed less than 40% bilateral stenosis. 30 day event monitor showed atrial fibrillation. Echocardiogram March 2017 showed normal LV systolic function, grade 1 diastolic dysfunction, moderate left atrial enlargement and mild tricuspid regurgitation. Nuclear study May 2017 showed ejection fraction 53%.  There was diaphragmatic attenuation but no ischemia.  Since last seen, over the past 2 to 3 months he has noticed increased dyspnea on exertion but no orthopnea or PND.  He also has noticed increased bilateral lower extremity edema.  He denies chest pain, palpitations.  No bleeding.  Several weeks ago he was eating a bagel and became choked followed by syncope.  He was unconscious for several seconds by his report.  No preceding palpitations, chest pain or dyspnea.  Current Outpatient Medications  Medication Sig Dispense Refill   carvedilol (COREG) 3.125 MG tablet Take 1 tablet (3.125 mg total) by mouth 2 (two) times daily with a meal. 60 tablet 12   Cholecalciferol (VITAMIN D3 PO) Take 1,000 Units by mouth daily.     COD LIVER OIL PO Take 1 capsule by mouth at bedtime.      ezetimibe (ZETIA) 10 MG tablet Take 10 mg by mouth daily.     hydrochlorothiazide (HYDRODIURIL) 12.5 MG tablet Take 12.5 mg by mouth every morning.     losartan (COZAAR) 50 MG tablet Take 50 mg by mouth daily.      Multiple Vitamin (MULTIVITAMIN WITH MINERALS) TABS tablet Take 1 tablet by mouth daily.     omeprazole (PRILOSEC OTC) 20 MG tablet Take 20 mg by mouth every other day.      pravastatin (PRAVACHOL) 80 MG tablet Take 80 mg by mouth at bedtime.     saw palmetto 160 MG capsule Take 160 mg by mouth 2 (two) times daily.     tadalafil (CIALIS) 5 MG tablet Take 5 mg by mouth at bedtime.     TESTOSTERONE TD Place 1 mL onto the skin daily. Apply every morning to back shoulder - 10% lipoderm cream compounded at [place] [facility] 15 MG TABS tablet TAKE ONE (1) TABLET BY MOUTH EACH DAY WITH SUPPER 90 tablet 2   No current facility-administered medications for this visit.     Past Medical History:  Diagnosis Date   At risk for sleep apnea    STOP-BANG= 5   SENT TO PCP 05-20-2014   Bilateral carotid artery stenosis    mild bilateral proximcal ICA  40% per duplex 11/ 2014   Coronary artery disease    a. s/p CABG in 2007 w/ LIMA-LAD, SVG-D1, SVG-1st & 2nd Mrg, SVG-PDA and posterior lateral   ED (erectile dysfunction)    Eye infection 12/2014   GERD (gastroesophageal reflux disease)    History of embolic stroke no residual   11/ 2014  --  right PCA and MCA branch infarts (left side vision difficulties)   Hypertension    Other malaise and  fatigue   ° Paroxysmal atrial fibrillation (HCC)   ° Pneumonia 10/2015  ° led to CHF, in hospital x 6 days  ° S/P CABG x 6   ° 2007  ° Ureteral obstruction, right   ° ° °Past Surgical History:  °Procedure Laterality Date  ° APPENDECTOMY  1950's  ° CARDIAC CATHETERIZATION  10-31-2005  dr wall  ° severe three vessel/  perserved LV  ° CARDIOVASCULAR STRESS TEST  last one 05-19-2014  dr Adam Demary  ° low risk lexiscan no exercise study/ small inferobasal infarct with no ischemia /  ef 53%  ° CORONARY ARTERY BYPASS GRAFT  11-01-2005  dr owen  ° CLOSURE OF PFO/  LIMA to LAD,  SVG to D1,  SVG to 1st & 2nd  MARGINAL branches, SVG  to PDA and posterior lateral  ° CYSTOSCOPY  W/ URETERAL STENT PLACEMENT Right 05/21/2014  ° Procedure: CYSTOSCOPY WITH RETROGRADE PYELOGRAM/URETERAL STENT PLACEMENT;  Surgeon: Sigmund I Tannenbaum, MD;  Location: [facility];  Service: Urology;  Laterality: Right;  ° CYSTOSCOPY WITH URETEROSCOPY Right 05/21/2014  ° Procedure: CYSTOSCOPY WITH URETEROSCOPY;  Surgeon: Sigmund I Tannenbaum, MD;  Location: [facility];  Service: Urology;  Laterality: Right;  ° INGUINAL HERNIA REPAIR Bilateral   ° LAPAROSCOPIC CHOLECYSTECTOMY  10-02-2000  ° RIGHT URETER OBSTRUCTION SURGERY  age 23  ° TEE WITHOUT CARDIOVERSION N/A 07/17/2013  ° Procedure: TRANSESOPHAGEAL ECHOCARDIOGRAM (TEE);  Surgeon: Peter C Nishan, MD;  Location: [facility];  Service: Cardiovascular;  Laterality: N/A;  normal LV size, mild prolapse of posterior mitral valve leaflet,  mild MR and TR, normal AV,  no LAA thrombus,  atrial septal aneurysm with no PFO/ ASD and negative bubble study, normal RV, normal aorta with no debris  ° ° °Social History  ° °Socioeconomic History  ° Marital status: Married  °  Spouse name: maxine  ° Number of children: 4  ° Years of education: college4  ° Highest education level: Not on file  °Occupational History  ° Occupation: retired  °Tobacco Use  ° Smoking status: Former  °  Packs/day: 1.00  °  Years: 35.00  °  Pack years: 35.00  °  Types: Cigarettes  °  Quit date: 08/29/1977  °  Years since quitting: 44.0  ° Smokeless tobacco: Never  °Substance and Sexual Activity  ° Alcohol use: No  °  Alcohol/week: 0.0 standard drinks  ° Drug use: No  ° Sexual activity: Not on file  °Other Topics Concern  ° Not on file  °Social History Narrative  ° Patient is married with 4 children.  ° Patient is right handed.  ° Patient has college education.  ° Patient drinks decaff coffee.  ° °Social Determinants of Health  ° °Financial Resource Strain: Not on file  °Food Insecurity: Not on file  °Transportation Needs: Not on file  °Physical Activity: Not on file  °Stress:  Not on file  °Social Connections: Not on file  °Intimate Partner Violence: Not on file  ° ° °Family History  °Problem Relation Age of Onset  ° Heart disease Mother   °     PPM  ° Hypertension Mother   ° Lymphoma Father   ° Prostate cancer Father   ° Prostate cancer Brother   ° Aneurysm Brother   °     REPAIR  ° CAD Brother   °     CABG  ° Other Brother   °     STENTS IN LEGS  °   Other Sister        STOMACH ISSUES   Depression Sister    Lung cancer Brother        Asbestosis    ROS: no fevers or chills, productive cough, hemoptysis, dysphasia, odynophagia, melena, hematochezia, dysuria, hematuria, rash, seizure activity, orthopnea, PND, pedal edema, claudication. Remaining systems are negative.  Physical Exam: Well-developed well-nourished in no acute distress.  Skin is warm and dry.  HEENT is normal.  Neck is supple.  Chest is clear to auscultation with normal expansion.  Cardiovascular exam is bradycardic and irregular Abdominal exam nontender or distended. No masses palpated. Extremities show 1+ edema. neuro grossly intact  ECG-atrial fibrillation with PVCs or aberrantly conducted beats, nonspecific ST changes.  Personally reviewed  A/P  1 coronary artery disease-patient denies chest pain.  Continue statin.  No aspirin given need for anticoagulation.  2 paroxysmal atrial fibrillation-patient has developed recurrent atrial fibrillation and this is likely the cause of his increasing CHF symptoms.  His heart rate is slow.  Discontinue carvedilol.  Continue Xarelto.  Given that he is symptomatic I would like to see if he would maintain sinus rhythm.  He has not missed any doses of Xarelto.  We will plan to proceed with elective cardioversion.  Repeat echocardiogram.  We will also check hemoglobin and renal function.  If he does not hold sinus rhythm options may be somewhat difficult.  He has baseline renal insufficiency and therefore Tikosyn likely not an option.  Flecainide would not be an  option given baseline coronary disease.  Amiodarone would likely exacerbate bradycardia.  If atrial fibrillation recurs would refer to electrophysiology for options.  3 chronic diastolic congestive heart failure-patient is volume overloaded on examination.  Discontinue hydrochlorothiazide.  Treat with Lasix 40 mg daily.  In 1 week check potassium and renal function.  4 hypertension-blood pressure controlled.  However he is bradycardic.  Discontinue carvedilol and follow blood pressure.  5 hyperlipidemia-continue pravastatin and zetia.  Check lipids and liver.  Kirk Ruths, MD

## 2021-09-16 ENCOUNTER — Other Ambulatory Visit: Payer: Self-pay

## 2021-09-16 ENCOUNTER — Ambulatory Visit (INDEPENDENT_AMBULATORY_CARE_PROVIDER_SITE_OTHER): Payer: Medicare Other | Admitting: Family

## 2021-09-16 DIAGNOSIS — B351 Tinea unguium: Secondary | ICD-10-CM | POA: Diagnosis not present

## 2021-09-16 DIAGNOSIS — I2510 Atherosclerotic heart disease of native coronary artery without angina pectoris: Secondary | ICD-10-CM | POA: Diagnosis not present

## 2021-09-21 ENCOUNTER — Ambulatory Visit (INDEPENDENT_AMBULATORY_CARE_PROVIDER_SITE_OTHER): Payer: Medicare Other | Admitting: Cardiology

## 2021-09-21 ENCOUNTER — Other Ambulatory Visit: Payer: Self-pay | Admitting: *Deleted

## 2021-09-21 ENCOUNTER — Other Ambulatory Visit: Payer: Self-pay

## 2021-09-21 ENCOUNTER — Encounter: Payer: Self-pay | Admitting: Cardiology

## 2021-09-21 VITALS — BP 134/60 | HR 50 | Ht 68.5 in | Wt 184.0 lb

## 2021-09-21 DIAGNOSIS — I2510 Atherosclerotic heart disease of native coronary artery without angina pectoris: Secondary | ICD-10-CM | POA: Diagnosis not present

## 2021-09-21 DIAGNOSIS — E7800 Pure hypercholesterolemia, unspecified: Secondary | ICD-10-CM | POA: Diagnosis not present

## 2021-09-21 DIAGNOSIS — I480 Paroxysmal atrial fibrillation: Secondary | ICD-10-CM | POA: Diagnosis not present

## 2021-09-21 DIAGNOSIS — I10 Essential (primary) hypertension: Secondary | ICD-10-CM | POA: Diagnosis not present

## 2021-09-21 DIAGNOSIS — I5032 Chronic diastolic (congestive) heart failure: Secondary | ICD-10-CM

## 2021-09-21 MED ORDER — FUROSEMIDE 20 MG PO TABS: 40.0000 mg | ORAL_TABLET | Freq: Every day | ORAL | 3 refills | Status: DC

## 2021-09-21 NOTE — Patient Instructions (Signed)
Medication Instructions:   STOP CARVEDILOL  STOP HCTZ  START FUROSEMIDE 40 MG ONCE DAILY= 2 OF THE 20 MG TABLETS ONCE DAILY  *If you need a refill on your cardiac medications before your next appointment, please call your pharmacy*   Lab Work:  Your physician recommends that you return for lab work in: [facility]  If you have labs (blood work) drawn today and your tests are completely normal, you will receive your results only by: MyChart Message (if you have MyChart) OR A paper copy in the mail If you have any lab test that is abnormal or we need to change your treatment, we will call you to review the results.   Testing/Procedures:  Your physician has requested that you have an echocardiogram. Echocardiography is a painless test that uses sound waves to create images of your heart. It provides your doctor with information about the size and shape of your heart and how well your hearts chambers and valves are working. This procedure takes approximately one hour. There are no restrictions for this procedure. [facility] [facility]-1ST FLOOR IMAGING DEPARTMENT    You are scheduled for a Cardioversion on Monday 10/03/21 with Dr. Izora Ribas.  Please arrive at the [facility] (Main Entrance A) at [facility]: [address] [place], [place] [address] at 10 am. (1 hour prior to procedure unless lab work is needed; if lab work is needed arrive 1.5 hours ahead)  DIET: Nothing to eat or drink after midnight except a sip of water with medications (see medication instructions below)  FYI: For your safety, and to allow [place] to monitor your vital signs accurately during the surgery/procedure we request that   if you have artificial nails, gel coating, SNS etc. Please have those removed prior to your surgery/procedure. Not having the nail coverings /polish removed may result in cancellation or delay of your surgery/procedure.   Medication Instructions: DO NOT TAKE FUROSEMIDE THE  MORNING OF THE  PROCEDURE-TAKE ALL OTHER MEDICATIONS WITH SIPS OF WATER  Continue your anticoagulant: Carlena Hurl  You must have a responsible person to drive you home and stay in the waiting area during your procedure. Failure to do so could result in cancellation.  Bring your insurance cards.  *Special Note: Every effort is made to have your procedure done on time. Occasionally there are emergencies that occur at the hospital that may cause delays. Please be patient if a delay does occur.       Follow-Up: At [facility], you and your health needs are our priority.  As part of our continuing mission to provide you with exceptional heart care, we have created designated Provider Care Teams.  These Care Teams include your primary Cardiologist (physician) and Advanced Practice Providers (APPs -  Physician Assistants and Nurse Practitioners) who all work together to provide you with the care you need, when you need it.  We recommend signing up for the patient portal called "MyChart".  Sign up information is provided on this After Visit Summary.  MyChart is used to connect with patients for Virtual Visits (Telemedicine).  Patients are able to view lab/test results, encounter notes, upcoming appointments, etc.  Non-urgent messages can be sent to your provider as well.   To learn more about what you can do with MyChart, go to [email or web address].    Your next appointment:   3 month(s)  The format for your next appointment:   In Person  Provider:   Olga Millers MD

## 2021-09-23 ENCOUNTER — Encounter (HOSPITAL_COMMUNITY): Payer: Self-pay | Admitting: Internal Medicine

## 2021-09-29 LAB — LIPID PANEL
Chol/HDL Ratio: 2.2 ratio (ref 0.0–5.0)
Cholesterol, Total: 83 mg/dL — ABNORMAL LOW (ref 100–199)
HDL: 38 mg/dL — ABNORMAL LOW (ref >39)
LDL Chol Calc (NIH): 32 mg/dL (ref 0–99)
Triglycerides: 51 mg/dL (ref 0–149)
VLDL Cholesterol Cal: 13 mg/dL (ref 5–40)

## 2021-09-29 LAB — COMPREHENSIVE METABOLIC PANEL
ALT: 17 IU/L (ref 0–44)
AST: 19 IU/L (ref 0–40)
Albumin/Globulin Ratio: 1.2 (ref 1.2–2.2)
Albumin: 4.3 g/dL (ref 3.6–4.6)
Alkaline Phosphatase: 105 IU/L (ref 44–121)
BUN/Creatinine Ratio: 13 (ref 10–24)
BUN: 24 mg/dL (ref 8–27)
Bilirubin Total: 0.8 mg/dL (ref 0.0–1.2)
CO2: 25 mmol/L (ref 20–29)
Calcium: 10 mg/dL (ref 8.6–10.2)
Chloride: 102 mmol/L (ref 96–106)
Creatinine, Ser: 1.92 mg/dL — ABNORMAL HIGH (ref 0.76–1.27)
Globulin, Total: 3.6 g/dL (ref 1.5–4.5)
Glucose: 95 mg/dL (ref 70–99)
Potassium: 4.4 mmol/L (ref 3.5–5.2)
Sodium: 144 mmol/L (ref 134–144)
Total Protein: 7.9 g/dL (ref 6.0–8.5)
eGFR: 33 mL/min/{1.73_m2} — ABNORMAL LOW (ref >59)

## 2021-09-29 LAB — CBC
Hematocrit: 49.9 % (ref 37.5–51.0)
Hemoglobin: 16.5 g/dL (ref 13.0–17.7)
MCH: 29.9 pg (ref 26.6–33.0)
MCHC: 33.1 g/dL (ref 31.5–35.7)
MCV: 90 fL (ref 79–97)
Platelets: 125 10*3/uL — ABNORMAL LOW (ref 150–450)
RBC: 5.52 x10E6/uL (ref 4.14–5.80)
RDW: 13 % (ref 11.6–15.4)
WBC: 5.5 10*3/uL (ref 3.4–10.8)

## 2021-09-30 ENCOUNTER — Telehealth: Payer: Self-pay | Admitting: Cardiology

## 2021-09-30 NOTE — Telephone Encounter (Signed)
-  Informed pt lab results e-faxed to pcp -Pt verbalized understanding

## 2021-09-30 NOTE — Telephone Encounter (Signed)
Patient was requesting that his recent lab results be sent to his PCP Dr. Duaine Dredge.

## 2021-10-03 ENCOUNTER — Ambulatory Visit (HOSPITAL_COMMUNITY): Payer: Medicare Other | Admitting: Anesthesiology

## 2021-10-03 ENCOUNTER — Ambulatory Visit (HOSPITAL_COMMUNITY)
Admission: RE | Admit: 2021-10-03 | Discharge: 2021-10-03 | Disposition: A | Discharge status: Home / Self Care | Payer: Medicare Other | Attending: Internal Medicine | Admitting: Internal Medicine

## 2021-10-03 ENCOUNTER — Encounter (HOSPITAL_COMMUNITY): Payer: Self-pay | Admitting: Internal Medicine

## 2021-10-03 ENCOUNTER — Encounter (HOSPITAL_COMMUNITY)
Admission: RE | Disposition: A | Discharge status: Home / Self Care | Payer: Self-pay | Source: Home / Self Care | Attending: Internal Medicine

## 2021-10-03 ENCOUNTER — Other Ambulatory Visit: Payer: Self-pay

## 2021-10-03 DIAGNOSIS — I110 Hypertensive heart disease with heart failure: Secondary | ICD-10-CM | POA: Insufficient documentation

## 2021-10-03 DIAGNOSIS — I5032 Chronic diastolic (congestive) heart failure: Secondary | ICD-10-CM | POA: Diagnosis not present

## 2021-10-03 DIAGNOSIS — I2510 Atherosclerotic heart disease of native coronary artery without angina pectoris: Secondary | ICD-10-CM | POA: Diagnosis not present

## 2021-10-03 DIAGNOSIS — Z87891 Personal history of nicotine dependence: Secondary | ICD-10-CM | POA: Diagnosis not present

## 2021-10-03 DIAGNOSIS — E785 Hyperlipidemia, unspecified: Secondary | ICD-10-CM | POA: Insufficient documentation

## 2021-10-03 DIAGNOSIS — I480 Paroxysmal atrial fibrillation: Secondary | ICD-10-CM | POA: Insufficient documentation

## 2021-10-03 DIAGNOSIS — Z951 Presence of aortocoronary bypass graft: Secondary | ICD-10-CM | POA: Diagnosis not present

## 2021-10-03 DIAGNOSIS — Z7901 Long term (current) use of anticoagulants: Secondary | ICD-10-CM | POA: Diagnosis not present

## 2021-10-03 DIAGNOSIS — N289 Disorder of kidney and ureter, unspecified: Secondary | ICD-10-CM | POA: Insufficient documentation

## 2021-10-03 DIAGNOSIS — I4891 Unspecified atrial fibrillation: Secondary | ICD-10-CM | POA: Diagnosis present

## 2021-10-03 DIAGNOSIS — Z79899 Other long term (current) drug therapy: Secondary | ICD-10-CM | POA: Diagnosis not present

## 2021-10-03 DIAGNOSIS — K219 Gastro-esophageal reflux disease without esophagitis: Secondary | ICD-10-CM | POA: Diagnosis not present

## 2021-10-03 HISTORY — PX: CARDIOVERSION: SHX1299

## 2021-10-03 SURGERY — CARDIOVERSION
Anesthesia: General

## 2021-10-03 MED ORDER — SODIUM CHLORIDE 0.9 % IV SOLN: INTRAVENOUS | Status: DC

## 2021-10-03 MED ORDER — PROPOFOL 10 MG/ML IV BOLUS
INTRAVENOUS | Status: DC | PRN
Start: 2021-10-03 — End: 2021-10-03
  Administered 2021-10-03: 60 mg via INTRAVENOUS

## 2021-10-03 MED ORDER — LIDOCAINE 2% (20 MG/ML) 5 ML SYRINGE
INTRAMUSCULAR | Status: DC | PRN
Start: 2021-10-03 — End: 2021-10-03
  Administered 2021-10-03: 50 mg via INTRAVENOUS

## 2021-10-03 NOTE — Transfer of Care (Signed)
Immediate Anesthesia Transfer of Care Note  Patient: Jose [name]  Procedure(s) Performed: CARDIOVERSION  Patient Location: Endoscopy Unit  Anesthesia Type:General  Level of Consciousness: awake, alert  and oriented  Airway & Oxygen Therapy: Patient Spontanous Breathing  Post-op Assessment: Report given to RN, Post -op Vital signs reviewed and stable and Patient moving all extremities  Post vital signs: Reviewed and stable  Last Vitals:  Vitals Value Taken Time  BP    Temp    Pulse    Resp    SpO2      Last Pain:  Vitals:   10/03/21 1012  TempSrc: Oral  PainSc: 0-No pain         Complications: No notable events documented.

## 2021-10-03 NOTE — Anesthesia Postprocedure Evaluation (Signed)
Anesthesia Post Note  Patient: Vinetta Bergamo  Procedure(s) Performed: CARDIOVERSION     Patient location during evaluation: PACU Anesthesia Type: General Level of consciousness: awake and alert Pain management: pain level controlled Vital Signs Assessment: post-procedure vital signs reviewed and stable Respiratory status: spontaneous breathing, nonlabored ventilation and respiratory function stable Cardiovascular status: stable and blood pressure returned to baseline Anesthetic complications: no   No notable events documented.  Last Vitals:  Vitals:   10/03/21 1128 10/03/21 1138  BP: (!) 153/65 (!) 156/72  Pulse: 66 (!) 52  Resp: 13 13  Temp:    SpO2: 94% 96%    Last Pain:  Vitals:   10/03/21 1138  TempSrc:   PainSc: 0-No pain                 Beryle Lathe

## 2021-10-03 NOTE — CV Procedure (Signed)
° °  Electrical Cardioversion Procedure Note Jose [name] [id number] 09/13/1930  Procedure: Electrical Cardioversion Indications:  Atrial Fibrillation  Time Out: Verified patient identification, verified procedure,medications/allergies/relevent history reviewed, required imaging and test results available.  Performed  Procedure Details  The patient was NPO after midnight. Anesthesia was administered at the beside  by [facility] with 60mg  of lidocaine and 50 mg propofol.  Cardioversion was done with synchronized biphasic defibrillation with AP pads with 200 Joules.  The patient converted to normal sinus rhythm. The patient tolerated the procedure well   IMPRESSION:  Successful cardioversion of atrial flutter    Jose [name] 10/03/2021, 11:23 AM

## 2021-10-03 NOTE — Anesthesia Preprocedure Evaluation (Addendum)
Anesthesia Evaluation  Patient identified by MRN, date of birth, ID band Patient awake    Reviewed: Allergy & Precautions, NPO status , Patient's Chart, lab work & pertinent test results, reviewed documented beta blocker date and time   Airway Mallampati: II  TM Distance: >3 FB Neck ROM: Full    Dental  (+) Edentulous Upper, Edentulous Lower   Pulmonary pneumonia, former smoker,  Hx/o brochiolitis obliterans   breath sounds clear to auscultation + decreased breath sounds      Cardiovascular hypertension, Pt. on medications + CAD, + CABG and +CHF  + dysrhythmias Atrial Fibrillation  Rhythm:Irregular Rate:Normal  CABG x 6 2007   Neuro/Psych Hx/o embolic stroke- visual field defect resolved CVA, No Residual Symptoms negative psych ROS   GI/Hepatic Neg liver ROS, GERD  Medicated,  Endo/Other  Hyperlipidemia  Renal/GU Renal disease  negative genitourinary   Musculoskeletal negative musculoskeletal ROS (+)   Abdominal   Peds  Hematology Xarelto therapy- last dose   Anesthesia Other Findings   Reproductive/Obstetrics ED                            Anesthesia Physical Anesthesia Plan  ASA: 3  Anesthesia Plan: General   Post-op Pain Management:    Induction: Intravenous  PONV Risk Score and Plan: 2 and Treatment may vary due to age or medical condition  Airway Management Planned: Natural Airway and Mask  Additional Equipment:   Intra-op Plan:   Post-operative Plan:   Informed Consent: I have reviewed the patients History and Physical, chart, labs and discussed the procedure including the risks, benefits and alternatives for the proposed anesthesia with the patient or authorized representative who has indicated his/her understanding and acceptance.       Plan Discussed with: CRNA and Anesthesiologist  Anesthesia Plan Comments:         Anesthesia Quick Evaluation

## 2021-10-03 NOTE — Anesthesia Procedure Notes (Signed)
Procedure Name: General with mask airway Date/Time: 10/03/2021 11:10 AM Performed by: Amadeo Garnet, CRNA Pre-anesthesia Checklist: Patient identified, Emergency Drugs available, Suction available, Patient being monitored and Timeout performed Patient Re-evaluated:Patient Re-evaluated prior to induction Oxygen Delivery Method: Ambu bag Preoxygenation: Pre-oxygenation with 100% oxygen Induction Type: IV induction Placement Confirmation: CO2 detector Dental Injury: Teeth and Oropharynx as per pre-operative assessment

## 2021-10-03 NOTE — Interval H&P Note (Signed)
History and Physical Interval Note:  10/03/2021 10:11 AM  Jose [name]  has presented today for surgery, with the diagnosis of ATRIAL FIB.  The various methods of treatment have been discussed with the patient and family. After consideration of risks, benefits and other options for treatment, the patient has consented to  Procedure(s): CARDIOVERSION (N/A) as a surgical intervention.  The patient's history has been reviewed, patient examined, no change in status, stable for surgery.  I have reviewed the patient's chart and labs.  Questions were answered to the patient's satisfaction.     Teaghan Melrose A Quiana Cobaugh

## 2021-10-03 NOTE — Discharge Instructions (Signed)

## 2021-10-06 ENCOUNTER — Encounter (HOSPITAL_COMMUNITY): Payer: Self-pay | Admitting: Internal Medicine

## 2021-10-10 ENCOUNTER — Emergency Department (HOSPITAL_COMMUNITY): Payer: Medicare Other

## 2021-10-10 ENCOUNTER — Observation Stay (HOSPITAL_COMMUNITY)
Admission: EM | Admit: 2021-10-10 | Discharge: 2021-10-11 | Disposition: A | Discharge status: Home / Self Care | Payer: Medicare Other | Attending: Internal Medicine | Admitting: Internal Medicine

## 2021-10-10 ENCOUNTER — Observation Stay (HOSPITAL_BASED_OUTPATIENT_CLINIC_OR_DEPARTMENT_OTHER): Payer: Medicare Other

## 2021-10-10 ENCOUNTER — Other Ambulatory Visit: Payer: Self-pay

## 2021-10-10 DIAGNOSIS — I110 Hypertensive heart disease with heart failure: Secondary | ICD-10-CM | POA: Insufficient documentation

## 2021-10-10 DIAGNOSIS — R001 Bradycardia, unspecified: Secondary | ICD-10-CM | POA: Diagnosis present

## 2021-10-10 DIAGNOSIS — I442 Atrioventricular block, complete: Secondary | ICD-10-CM | POA: Diagnosis not present

## 2021-10-10 DIAGNOSIS — I2510 Atherosclerotic heart disease of native coronary artery without angina pectoris: Secondary | ICD-10-CM | POA: Diagnosis not present

## 2021-10-10 DIAGNOSIS — Z79899 Other long term (current) drug therapy: Secondary | ICD-10-CM | POA: Insufficient documentation

## 2021-10-10 DIAGNOSIS — I4891 Unspecified atrial fibrillation: Secondary | ICD-10-CM | POA: Diagnosis not present

## 2021-10-10 DIAGNOSIS — I5022 Chronic systolic (congestive) heart failure: Secondary | ICD-10-CM | POA: Insufficient documentation

## 2021-10-10 DIAGNOSIS — I480 Paroxysmal atrial fibrillation: Secondary | ICD-10-CM | POA: Insufficient documentation

## 2021-10-10 DIAGNOSIS — I255 Ischemic cardiomyopathy: Secondary | ICD-10-CM | POA: Insufficient documentation

## 2021-10-10 DIAGNOSIS — Z951 Presence of aortocoronary bypass graft: Secondary | ICD-10-CM | POA: Diagnosis not present

## 2021-10-10 DIAGNOSIS — Z7901 Long term (current) use of anticoagulants: Secondary | ICD-10-CM | POA: Diagnosis not present

## 2021-10-10 DIAGNOSIS — E785 Hyperlipidemia, unspecified: Secondary | ICD-10-CM | POA: Diagnosis not present

## 2021-10-10 DIAGNOSIS — Z87891 Personal history of nicotine dependence: Secondary | ICD-10-CM | POA: Insufficient documentation

## 2021-10-10 DIAGNOSIS — I4819 Other persistent atrial fibrillation: Secondary | ICD-10-CM

## 2021-10-10 DIAGNOSIS — Z950 Presence of cardiac pacemaker: Secondary | ICD-10-CM

## 2021-10-10 DIAGNOSIS — Z20822 Contact with and (suspected) exposure to covid-19: Secondary | ICD-10-CM | POA: Diagnosis not present

## 2021-10-10 DIAGNOSIS — R55 Syncope and collapse: Secondary | ICD-10-CM

## 2021-10-10 LAB — COMPREHENSIVE METABOLIC PANEL
ALT: 17 U/L (ref 0–44)
AST: 21 U/L (ref 15–41)
Albumin: 3.8 g/dL (ref 3.5–5.0)
Alkaline Phosphatase: 93 U/L (ref 38–126)
Anion gap: 9 (ref 5–15)
BUN: 26 mg/dL — ABNORMAL HIGH (ref 8–23)
CO2: 28 mmol/L (ref 22–32)
Calcium: 10 mg/dL (ref 8.9–10.3)
Chloride: 101 mmol/L (ref 98–111)
Creatinine, Ser: 1.78 mg/dL — ABNORMAL HIGH (ref 0.61–1.24)
GFR, Estimated: 36 mL/min — ABNORMAL LOW (ref >60)
Glucose, Bld: 106 mg/dL — ABNORMAL HIGH (ref 70–99)
Potassium: 4.3 mmol/L (ref 3.5–5.1)
Sodium: 138 mmol/L (ref 135–145)
Total Bilirubin: 1 mg/dL (ref 0.3–1.2)
Total Protein: 8.3 g/dL — ABNORMAL HIGH (ref 6.5–8.1)

## 2021-10-10 LAB — ECHOCARDIOGRAM COMPLETE
AR max vel: 2.39 cm2
AV Peak grad: 5.6 mmHg
Ao pk vel: 1.19 m/s
Area-P 1/2: 3.17 cm2
Calc EF: 56.8 %
S' Lateral: 3.3 cm
Single Plane A2C EF: 54.7 %
Single Plane A4C EF: 57 %

## 2021-10-10 LAB — SURGICAL PCR SCREEN
MRSA, PCR: NEGATIVE
Staphylococcus aureus: NEGATIVE

## 2021-10-10 LAB — CBC WITH DIFFERENTIAL/PLATELET
Abs Immature Granulocytes: 0.01 10*3/uL (ref 0.00–0.07)
Basophils Absolute: 0.1 10*3/uL (ref 0.0–0.1)
Basophils Relative: 1 %
Eosinophils Absolute: 0.2 10*3/uL (ref 0.0–0.5)
Eosinophils Relative: 3 %
HCT: 50.3 % (ref 39.0–52.0)
Hemoglobin: 16.7 g/dL (ref 13.0–17.0)
Immature Granulocytes: 0 %
Lymphocytes Relative: 21 %
Lymphs Abs: 1.2 10*3/uL (ref 0.7–4.0)
MCH: 30.4 pg (ref 26.0–34.0)
MCHC: 33.2 g/dL (ref 30.0–36.0)
MCV: 91.5 fL (ref 80.0–100.0)
Monocytes Absolute: 0.5 10*3/uL (ref 0.1–1.0)
Monocytes Relative: 9 %
Neutro Abs: 3.7 10*3/uL (ref 1.7–7.7)
Neutrophils Relative %: 66 %
Platelets: 139 10*3/uL — ABNORMAL LOW (ref 150–400)
RBC: 5.5 MIL/uL (ref 4.22–5.81)
RDW: 13.8 % (ref 11.5–15.5)
WBC: 5.6 10*3/uL (ref 4.0–10.5)
nRBC: 0 % (ref 0.0–0.2)

## 2021-10-10 LAB — RESP PANEL BY RT-PCR (FLU A&B, COVID) ARPGX2
Influenza A by PCR: NEGATIVE
Influenza B by PCR: NEGATIVE
SARS Coronavirus 2 by RT PCR: NEGATIVE

## 2021-10-10 LAB — MAGNESIUM: Magnesium: 2 mg/dL (ref 1.7–2.4)

## 2021-10-10 LAB — BRAIN NATRIURETIC PEPTIDE: B Natriuretic Peptide: 307.6 pg/mL — ABNORMAL HIGH (ref 0.0–100.0)

## 2021-10-10 LAB — TROPONIN I (HIGH SENSITIVITY)
Troponin I (High Sensitivity): 23 ng/L — ABNORMAL HIGH (ref <18)
Troponin I (High Sensitivity): 24 ng/L — ABNORMAL HIGH (ref <18)

## 2021-10-10 MED ORDER — DIPHENHYDRAMINE HCL 50 MG/ML IJ SOLN: 50.0000 mg | Freq: Once | INTRAMUSCULAR | Status: AC

## 2021-10-10 MED ORDER — ONDANSETRON HCL 4 MG/2ML IJ SOLN: 4.0000 mg | Freq: Four times a day (QID) | INTRAMUSCULAR | Status: DC | PRN

## 2021-10-10 MED ORDER — CHLORHEXIDINE GLUCONATE 4 % EX LIQD
60.0000 mL | Freq: Once | CUTANEOUS | Status: AC
  Administered 2021-10-11: 4 via TOPICAL
  Filled 2021-10-10 (×2): qty 60

## 2021-10-10 MED ORDER — SODIUM CHLORIDE 0.9 % IV SOLN: 250.0000 mL | INTRAVENOUS | Status: DC

## 2021-10-10 MED ORDER — SODIUM CHLORIDE 0.9% FLUSH: 3.0000 mL | INTRAVENOUS | Status: DC | PRN

## 2021-10-10 MED ORDER — VANCOMYCIN HCL IN DEXTROSE 1-5 GM/200ML-% IV SOLN: 1000.0000 mg | INTRAVENOUS | Status: DC

## 2021-10-10 MED ORDER — PRAVASTATIN SODIUM 40 MG PO TABS
80.0000 mg | ORAL_TABLET | Freq: Every day | ORAL | Status: DC
  Administered 2021-10-10: 80 mg via ORAL
  Filled 2021-10-10: qty 2

## 2021-10-10 MED ORDER — CHLORHEXIDINE GLUCONATE 4 % EX LIQD
60.0000 mL | Freq: Once | CUTANEOUS | Status: AC
  Administered 2021-10-10: 4 via TOPICAL
  Filled 2021-10-10: qty 60

## 2021-10-10 MED ORDER — EZETIMIBE 10 MG PO TABS
10.0000 mg | ORAL_TABLET | Freq: Every morning | ORAL | Status: DC
  Filled 2021-10-10: qty 1

## 2021-10-10 MED ORDER — PREDNISONE 50 MG PO TABS
50.0000 mg | ORAL_TABLET | Freq: Four times a day (QID) | ORAL | Status: AC
  Administered 2021-10-10 – 2021-10-11 (×3): 50 mg via ORAL
  Filled 2021-10-10 (×3): qty 1

## 2021-10-10 MED ORDER — SODIUM CHLORIDE 0.9 % IV SOLN
80.0000 mg | INTRAVENOUS | Status: AC
  Administered 2021-10-11: 80 mg
  Filled 2021-10-10: qty 2

## 2021-10-10 MED ORDER — DIPHENHYDRAMINE HCL 50 MG/ML IJ SOLN: 50.0000 mg | Freq: Once | INTRAMUSCULAR | Status: DC

## 2021-10-10 MED ORDER — DIPHENHYDRAMINE HCL 25 MG PO CAPS
50.0000 mg | ORAL_CAPSULE | Freq: Once | ORAL | Status: AC
  Administered 2021-10-11: 50 mg via ORAL
  Filled 2021-10-10: qty 2

## 2021-10-10 MED ORDER — PANTOPRAZOLE SODIUM 40 MG PO TBEC
40.0000 mg | DELAYED_RELEASE_TABLET | Freq: Every day | ORAL | Status: DC
  Administered 2021-10-10 – 2021-10-11 (×2): 40 mg via ORAL
  Filled 2021-10-10 (×2): qty 1

## 2021-10-10 MED ORDER — PREDNISONE 20 MG PO TABS: 50.0000 mg | ORAL_TABLET | Freq: Four times a day (QID) | ORAL | Status: DC

## 2021-10-10 MED ORDER — DIPHENHYDRAMINE HCL 25 MG PO CAPS: 50.0000 mg | ORAL_CAPSULE | Freq: Once | ORAL | Status: DC

## 2021-10-10 MED ORDER — SODIUM CHLORIDE 0.9% FLUSH
3.0000 mL | Freq: Two times a day (BID) | INTRAVENOUS | Status: DC
  Administered 2021-10-10: 21:00:00 3 mL via INTRAVENOUS

## 2021-10-10 MED ORDER — SODIUM CHLORIDE 0.9 % IV SOLN: INTRAVENOUS | Status: DC

## 2021-10-10 MED ORDER — NITROGLYCERIN 0.4 MG SL SUBL: 0.4000 mg | SUBLINGUAL_TABLET | SUBLINGUAL | Status: DC | PRN

## 2021-10-10 MED ORDER — VANCOMYCIN HCL IN DEXTROSE 1-5 GM/200ML-% IV SOLN
1000.0000 mg | INTRAVENOUS | Status: AC
  Administered 2021-10-11: 1000 mg via INTRAVENOUS
  Filled 2021-10-10: qty 200

## 2021-10-10 MED ORDER — ACETAMINOPHEN 325 MG PO TABS: 650.0000 mg | ORAL_TABLET | ORAL | Status: DC | PRN

## 2021-10-10 NOTE — ED Notes (Signed)
Pt given turkey sandwich, crackers and water 

## 2021-10-10 NOTE — ED Notes (Signed)
Cardiology at bedside.

## 2021-10-10 NOTE — ED Notes (Signed)
ECHO at bedside.

## 2021-10-10 NOTE — ED Provider Notes (Signed)
[facility] EMERGENCY DEPARTMENT Provider Note   CSN: [id number] Arrival date & time: 10/10/21  1001     History  No chief complaint on file.   Jose [name] is a 88 y.o. male.  The history is provided by medical records and the patient. No language interpreter was used.  Near Syncope This is a new problem. The current episode started 1 to 2 hours ago. The problem occurs rarely. The problem has not changed since onset.Pertinent negatives include no chest pain, no abdominal pain, no headaches and no shortness of breath. Nothing aggravates the symptoms. Nothing relieves the symptoms. He has tried nothing for the symptoms. The treatment provided no relief.      Home Medications Prior to Admission medications   Medication Sig Start Date End Date Taking? Authorizing Provider  acetaminophen (TYLENOL) 650 MG CR tablet Take 650 mg by mouth every 8 (eight) hours as needed for pain.    [provider]  cholecalciferol (VITAMIN D) 25 MCG (1000 UNIT) tablet Take 1,000 Units by mouth in the morning.    [provider]  COD LIVER OIL PO Take 1 capsule by mouth at bedtime.     [provider]  ezetimibe (ZETIA) 10 MG tablet Take 10 mg by mouth every evening. 08/25/20   [provider]  furosemide (LASIX) 20 MG tablet Take 2 tablets (40 mg total) by mouth daily. 09/21/21 12/20/21  Lelon Perla, MD  losartan (COZAAR) 100 MG tablet Take 100 mg by mouth in the morning.    [provider]  Multiple Vitamin (MULTIVITAMIN WITH MINERALS) TABS tablet Take 1 tablet by mouth in the morning.    [provider]  omeprazole (PRILOSEC) 20 MG capsule Take 20 mg by mouth every other day. In the morning.    [provider]  Polyethyl Glycol-Propyl Glycol (SYSTANE ULTRA OP) Place 1-2 drops into both eyes in the morning and at bedtime.    [provider]  pravastatin (PRAVACHOL) 80 MG tablet Take 80 mg by mouth at bedtime.     [provider]  saw palmetto 160 MG capsule Take 160 mg by mouth 2 (two) times daily.    [provider]  tadalafil (CIALIS) 5 MG tablet Take 5 mg by mouth at bedtime.    [provider]  TESTOSTERONE TD Place 1 mL onto the skin in the morning. Apply every morning to back shoulder - 10% lipoderm cream compounded at [facility]    [provider]  XARELTO 15 MG TABS tablet TAKE ONE (1) TABLET BY MOUTH EACH DAY WITH SUPPER 01/12/20   Lelon Perla, MD      Allergies    Cephalexin, Iodinated contrast media, Iohexol, Ace inhibitors, Metoprolol, Viagra [sildenafil citrate], Ciprofloxacin, Codeine, and Sulfa antibiotics    Review of Systems   Review of Systems  Constitutional:  Positive for fatigue. Negative for chills, diaphoresis and fever.  HENT:  Negative for congestion.   Eyes:  Negative for visual disturbance.  Respiratory:  Negative for cough, chest tightness, shortness of breath and wheezing.   Cardiovascular:  Positive for palpitations and near-syncope. Negative for chest pain and leg swelling.  Gastrointestinal:  Negative for abdominal pain, constipation, diarrhea, nausea and vomiting.  Genitourinary:  Negative for dysuria and flank pain.  Musculoskeletal:  Negative for back pain, neck pain and neck stiffness.  Skin:  Negative for rash and wound.  Neurological:  Positive for light-headedness. Negative for dizziness, syncope (near), weakness and  headaches.  Psychiatric/Behavioral:  Negative for agitation.   All other systems reviewed and are negative.  Physical Exam Updated Vital Signs BP (!) 186/97 (BP Location: Right Arm)    Pulse (!) 53    Temp (!) 97.5 F (36.4 C) (Oral)    Resp 15    SpO2 97%  Physical Exam Vitals and nursing note reviewed.  Constitutional:      General: He is not in acute distress.    Appearance: He is well-developed. He is not toxic-appearing or diaphoretic.  HENT:     Head: Normocephalic and atraumatic.   Eyes:     Conjunctiva/sclera: Conjunctivae normal.  Cardiovascular:     Rate and Rhythm: Bradycardia present. Rhythm irregular.     Heart sounds: No murmur heard. Pulmonary:     Effort: Pulmonary effort is normal. No respiratory distress.     Breath sounds: Normal breath sounds. No wheezing, rhonchi or rales.  Chest:     Chest wall: No tenderness.  Abdominal:     General: Abdomen is flat.     Palpations: Abdomen is soft.     Tenderness: There is no abdominal tenderness. There is no guarding or rebound.  Musculoskeletal:        General: No swelling or tenderness.     Cervical back: Neck supple. No tenderness.  Skin:    General: Skin is warm and dry.     Capillary Refill: Capillary refill takes less than 2 seconds.     Findings: No erythema.  Neurological:     General: No focal deficit present.     Mental Status: He is alert.     Sensory: No sensory deficit.     Motor: No weakness.  Psychiatric:        Mood and Affect: Mood normal.    ED Results / Procedures / Treatments   Labs (all labs ordered are listed, but only abnormal results are displayed) Labs Reviewed  CBC WITH DIFFERENTIAL/PLATELET - Abnormal; Notable for the following components:      Result Value   Platelets 139 (*)    All other components within normal limits  COMPREHENSIVE METABOLIC PANEL - Abnormal; Notable for the following components:   Glucose, Bld 106 (*)    BUN 26 (*)    Creatinine, Ser 1.78 (*)    Total Protein 8.3 (*)    GFR, Estimated 36 (*)    All other components within normal limits  BRAIN NATRIURETIC PEPTIDE - Abnormal; Notable for the following components:   B Natriuretic Peptide 307.6 (*)    All other components within normal limits  TROPONIN I (HIGH SENSITIVITY) - Abnormal; Notable for the following components:   Troponin I (High Sensitivity) 23 (*)    All other components within normal limits  TROPONIN I (HIGH SENSITIVITY) - Abnormal; Notable for the following components:   Troponin  I (High Sensitivity) 24 (*)    All other components within normal limits  RESP PANEL BY RT-PCR (FLU A&B, COVID) ARPGX2  MAGNESIUM  TSH    EKG EKG Interpretation  Date/Time:  Monday October 10 2021 10:05:08 EST Ventricular Rate:  78 PR Interval:    QRS Duration: 124 QT Interval:  397 QTC Calculation: 377 R Axis:   77 Text Interpretation: Atrial fibrillation Nonspecific intraventricular conduction delay Borderline repolarization abnormality when compared to ECG before today, now back in Afib with PVCs. No STEMI Confirmed by Antony Blackbird [phone]) on 10/10/2021 10:10:06 AM  Radiology DG Chest Portable 1 View  Result Date: 10/10/2021  CLINICAL DATA:  Near syncope EXAM: PORTABLE CHEST 1 VIEW COMPARISON:  None. FINDINGS: No axillary or supraclavicular adenopathy. No mediastinal or hilar adenopathy. No pericardial fluid. Esophagus normal. IMPRESSION: No acute cardiopulmonary process. Electronically Signed   By: Suzy Bouchard M.D.   On: 10/10/2021 10:47    Procedures Procedures    Medications Ordered in ED Medications  nitroGLYCERIN (NITROSTAT) SL tablet 0.4 mg (has no administration in time range)  acetaminophen (TYLENOL) tablet 650 mg (has no administration in time range)  ondansetron (ZOFRAN) injection 4 mg (has no administration in time range)  sodium chloride flush (NS) 0.9 % injection 3 mL (has no administration in time range)    ED Course/ Medical Decision Making/ A&P                           Medical Decision Making Amount and/or Complexity of Data Reviewed Labs: ordered. Radiology: ordered.  Risk Decision regarding hospitalization.   Jose [name] is a 87 y.o. male with a past medical history significant for hypertension, hyperlipidemia, CAD with previous CABG, A-fib on Xarelto with recent cardioversion 1 week ago, previous stroke, and CHF who presents with near syncope, palpitations, and bradycardia.  According to patient, he had a cardioversion performed on  2/6, approximately 7 days ago, and has been feeling fairly well since then.  He been taking his Lasix and has had decrease in his chronic edema in the legs.  He has denied recent chest pain, palpitations, shortness of breath but reports this morning after shaving, he got very lightheaded and near syncopal.  He almost passed out but did not do so.  He reports he was having some palpitations but no chest pain or shortness of breath reported.  He called 911 and EMS reports he is in A-fib with rates dipping down into the 20s.  He also has had a significant mount of PVCs that they were noticing occasional with bigeminy patterns.  He denies any nausea, vomiting, and did not report any diaphoresis.  He denies any abdominal pain, back pain, or chest pain at this time.  He was given 500 cc of normal saline With EMS for the near syncope and lightheadedness but he was not hypotensive with them reportedly.  Glucose was normal with EMS.  On my initial exam, he is alert and awake and talking.  His breath sounds were clear and I did not appreciate a loud murmur.  Abdomen was nontender.  Chest was nontender, back nontender.  Patient's heart rate is in the 50s in A-fib and did have episodes of runs of intermittent bigeminy/PVCs where his perfusion beats did appear to be in the 20s and 30s.  He has mild edema in the legs but good pulses in extremities on my exam.  Patient resting comfortably now.  EKG showed A-fib with PVCs.  No STEMI.  We will get screening labs and then touch base with cardiology as the patient was cardioverted 7 days ago.  He reports he took Xarelto last night and has been taking all his medications as directed.  Do not see any evidence of beta-blocking medications at this time otherwise.  Anticipate discussion with cardiology about disposition.   Cardiology saw the patient and will admit for further management.         Final Clinical Impression(s) / ED Diagnoses Final diagnoses:  Bradycardia   Near syncope     Clinical Impression: 1. Bradycardia   2. Near syncope  Disposition: Admit  This note was prepared with assistance of Systems analyst. Occasional wrong-word or sound-a-like substitutions may have occurred due to the inherent limitations of voice recognition software.      Aubria Vanecek, Gwenyth Allegra, MD 10/10/21 [phone]

## 2021-10-10 NOTE — H&P (Addendum)
Cardiology Admission History and Physical:   Patient ID: Jose [name] MRN: [id number]; DOB: [birth year]   Admission date: 10/10/2021  PCP:  Jose Late, MD   [facility] Providers Cardiologist:  Kirk Ruths, MD    Chief Complaint:  dizzy spells, near syncope  Patient Profile:   Jose [name] is a 88 y.o. male with CAD (CABG 2007 w/PFO closure), HTN, HLD, AFib, stroke who is being seen 10/10/2021 for the evaluation of symptomatic bradycardia.  History of Present Illness:   Jose [name] saw dr. Stanford [name] 09/21/21, at that visit the patient reported feeling some unusual fatigue, SOB and some LE swelling.  He was noted to be bradycardiac, his coreg stopped, planned for an echo and his HCTZ changed to lasix.  The pt stated that after the change in medicines, he felt like he was getting some weak spells, occasional lightheaded. This AM he was in the bathroom, began feeling quite weak and lightheaded, decided he did not have the strength to shave, and while washing his face needed to hold on to the since to steady himself.  He felt like he may faint. He/his wife called for help (lives independent living) and the RN came found his pulse irregular and advised EMS.  LABS K+ 4.2 Mag 2.0 BUN/Creat 26/1.78 (baseline 1.4-1.9) BNP 307 HS Trop 23 WBC 5.6 H/H 16/50 Plts 139  CXR w/NAP  Here in the ER he feels well laying on the stretcher, not lightheaded. He has not had any kind of CP, his breathing is good, has not been SOB  He had a syncopal event some months ago though associated with coughing/choking, not recurrent   Past Medical History:  Diagnosis Date   At risk for sleep apnea    STOP-BANG= 5   SENT TO PCP 05-20-2014   Bilateral carotid artery stenosis    mild bilateral proximcal ICA  40% per duplex 11/ 2014   Coronary artery disease    a. s/p CABG in 2007 w/ LIMA-LAD, SVG-D1, SVG-1st & 2nd Mrg, SVG-PDA and posterior lateral   ED (erectile dysfunction)    Eye  infection 12/2014   GERD (gastroesophageal reflux disease)    History of embolic stroke no residual   11/ 2014  --  right PCA and MCA branch infarts (left side vision difficulties)   Hypertension    Other malaise and fatigue    Paroxysmal atrial fibrillation ([address])    Pneumonia 10/2015   led to CHF, in hospital x 6 days   S/P CABG x 6    2007   Ureteral obstruction, right     Past Surgical History:  Procedure Laterality Date   APPENDECTOMY  1950's   CARDIAC CATHETERIZATION  10-31-2005  dr wall   severe three vessel/  perserved LV   CARDIOVASCULAR STRESS TEST  last one 05-19-2014  dr Jose [name]   low risk lexiscan no exercise study/ small inferobasal infarct with no ischemia /  ef 53%   CARDIOVERSION N/A 10/03/2021   Procedure: CARDIOVERSION;  Surgeon: Werner Lean, MD;  Location: [facility];  Service: Cardiovascular;  Laterality: N/A;   CORONARY ARTERY BYPASS GRAFT  11-01-2005  dr Roxy Manns   CLOSURE OF PFO/  LIMA to LAD,  SVG to D1,  SVG to 1st & 2nd  MARGINAL branches, SVG  to PDA and posterior lateral   CYSTOSCOPY W/ URETERAL STENT PLACEMENT Right 05/21/2014   Procedure: CYSTOSCOPY WITH RETROGRADE PYELOGRAM/URETERAL STENT PLACEMENT;  Surgeon: Ailene Rud, MD;  Location: [facility];  Service: Urology;  Laterality: Right;   CYSTOSCOPY WITH URETEROSCOPY Right 05/21/2014   Procedure: CYSTOSCOPY WITH URETEROSCOPY;  Surgeon: Ailene Rud, MD;  Location: [facility];  Service: Urology;  Laterality: Right;   INGUINAL HERNIA REPAIR Bilateral    LAPAROSCOPIC CHOLECYSTECTOMY  10-02-2000   RIGHT URETER OBSTRUCTION SURGERY  age 81   TEE WITHOUT CARDIOVERSION N/A 07/17/2013   Procedure: TRANSESOPHAGEAL ECHOCARDIOGRAM (TEE);  Surgeon: Josue Hector, MD;  Location: [facility];  Service: Cardiovascular;  Laterality: N/A;  normal LV size, mild prolapse of posterior mitral valve leaflet,  mild MR and TR, normal AV,  no LAA thrombus,  atrial septal  aneurysm with no PFO/ ASD and negative bubble study, normal RV, normal aorta with no debris     Medications Prior to Admission: Prior to Admission medications   Medication Sig Start Date End Date Taking? Authorizing Provider  acetaminophen (TYLENOL) 650 MG CR tablet Take 650 mg by mouth every 8 (eight) hours as needed for pain.   Yes [provider]  cholecalciferol (VITAMIN D) 25 MCG (1000 UNIT) tablet Take 1,000 Units by mouth in the morning.   Yes [provider]  COD LIVER OIL PO Take 1 capsule by mouth at bedtime.    Yes [provider]  ezetimibe (ZETIA) 10 MG tablet Take 10 mg by mouth every morning. 08/25/20  Yes [provider]  furosemide (LASIX) 20 MG tablet Take 2 tablets (40 mg total) by mouth daily. Patient taking differently: Take 40 mg by mouth every morning. 09/21/21 12/20/21 Yes Lelon Perla, MD  losartan (COZAAR) 100 MG tablet Take 100 mg by mouth in the morning.   Yes [provider]  Multiple Vitamin (MULTIVITAMIN WITH MINERALS) TABS tablet Take 1 tablet by mouth in the morning.   Yes [provider]  omeprazole (PRILOSEC) 20 MG capsule Take 20 mg by mouth every other day. In the morning.   Yes [provider]  Polyethyl Glycol-Propyl Glycol (SYSTANE ULTRA OP) Place 1-2 drops into both eyes in the morning and at bedtime.   Yes [provider]  pravastatin (PRAVACHOL) 80 MG tablet Take 80 mg by mouth at bedtime.   Yes [provider]  saw palmetto 160 MG capsule Take 160 mg by mouth 2 (two) times daily.   Yes [provider]  tadalafil (CIALIS) 5 MG tablet Take 5 mg by mouth at bedtime.   Yes [provider]  TESTOSTERONE TD Place 1 mL onto the skin See admin instructions. 10% testosterone cream compounded at [place]  - apply 1 ml topically to shoulders once every morning   Yes [provider]  XARELTO 15 MG TABS tablet TAKE ONE (1) TABLET BY MOUTH EACH  DAY WITH SUPPER Patient taking differently: 15 mg daily after supper. 01/12/20  Yes Lelon Perla, MD     Allergies:    Allergies  Allergen Reactions   Cephalexin Anaphylaxis    Pt says he has tolerated PCN in the past   Iodinated Contrast Media Anaphylaxis   Iohexol Anaphylaxis     THROAT SWELLS AND STOPS BREATHING    Ace Inhibitors Cough   Metoprolol Other (See Comments)    Erectile dysfunction   Viagra [Sildenafil Citrate] Other (See Comments)    Headache and vision changes   Ciprofloxacin Hives, Itching and Rash   Codeine Nausea And Vomiting and Other (See Comments)    headaches   Sulfa Antibiotics Rash, Hives and Itching    Social  History:   Social History   Socioeconomic History   Marital status: Married    Spouse name: maxine   Number of children: 4   Years of education: college4   Highest education level: Not on file  Occupational History   Occupation: retired  Tobacco Use   Smoking status: Former    Packs/day: 1.00    Years: 35.00    Pack years: 35.00    Types: Cigarettes    Quit date: 08/29/1977    Years since quitting: 44.1   Smokeless tobacco: Never  Substance and Sexual Activity   Alcohol use: No    Alcohol/week: 0.0 standard drinks   Drug use: No   Sexual activity: Not on file  Other Topics Concern   Not on file  Social History Narrative   Patient is married with 4 children.   Patient is right handed.   Patient has college education.   Patient drinks decaff coffee.   Social Determinants of Health   Financial Resource Strain: Not on file  Food Insecurity: Not on file  Transportation Needs: Not on file  Physical Activity: Not on file  Stress: Not on file  Social Connections: Not on file  Intimate Partner Violence: Not on file    Family History:   The patient's family history includes Aneurysm in his brother; CAD in his brother; Depression in his sister; Heart disease in his mother; Hypertension in his mother; Lung cancer in his brother;  Lymphoma in his father; Other in his brother and sister; Prostate cancer in his brother and father.    ROS:  Please see the history of present illness.  all other ROS reviewed and negative.     Physical Exam/Data:   Vitals:   10/10/21 1245 10/10/21 1300 10/10/21 1315 10/10/21 1330  BP: (!) 146/73 (!) 144/80 (!) 143/89 136/79  Pulse: (!) 49 (!) 53 (!) 40 (!) 40  Resp: 15 18 16 14   Temp:      TempSrc:      SpO2: 97% 95% 96% 91%    Intake/Output Summary (Last 24 hours) at 10/10/2021 1347 Last data filed at 10/10/2021 1312 Gross per 24 hour  Intake 250 ml  Output 825 ml  Net -575 ml   Last 3 Weights 10/03/2021 09/21/2021 10/20/2020  Weight (lbs) 184 lb 1.4 oz 184 lb 178 lb 1.3 oz  Weight (kg) 83.5 kg 83.462 kg 80.777 kg     There is no height or weight on file to calculate BMI.  General:  Well nourished, well developed, in no acute distress HEENT: normal Neck: no JVD Vascular: No carotid bruits; Distal pulses 2+ bilaterally   Cardiac:  irreg-irreg; no murmurs, gallops or rubs Lungs:  CTA b/l, no wheezing, rhonchi or rales  Abd: soft, nontender, no hepatomegaly  Ext: no edema Musculoskeletal:  No deformities Skin: warm and dry  Neuro:  no focal abnormalities noted Psych:  Normal affect    EKG:  The ECG that was done today was personally reviewed and demonstrates  Afib 58bpm, PVC AFib 78, PVCs  OLD 10/03/21: SB 57bpm, 1st degree AVBlock, 212ms  TELE: Afib high 30's-60's, occ PBCs, rare couplet   Relevant CV Studies:  11/22/21: TTE Study Conclusions  - Left ventricle: The cavity size was normal. There was mild    concentric hypertrophy. Systolic function was normal. The    estimated ejection fraction was in the range of 55% to 60%. Wall    motion was normal; there were no regional wall motion  abnormalities. Doppler parameters are consistent with abnormal    left ventricular relaxation (grade 1 diastolic dysfunction).  - Aortic valve: Transvalvular velocity was within  the normal range.    There was no stenosis. There was no regurgitation.  - Mitral valve: Calcified annulus. There was trivial regurgitation.  - Left atrium: The atrium was moderately dilated.  - Right ventricle: The cavity size was normal. Wall thickness was    normal. Systolic function was mildly reduced.  - Atrial septum: No defect or patent foramen ovale was identified    by color flow Doppler.  - Tricuspid valve: There was mild regurgitation.  - Pulmonary arteries: Systolic pressure was within the normal    range. PA peak pressure: 32 mm Hg (S).  - Inferior vena cava: The vessel was normal in size. The    respirophasic diameter changes were in the normal range (>= 50%),    consistent with normal central venous pressure.   Laboratory Data:  High Sensitivity Troponin:   Recent Labs  Lab 10/10/21 1020  TROPONINIHS 23*      Chemistry Recent Labs  Lab 10/10/21 1020  NA 138  K 4.3  CL 101  CO2 28  GLUCOSE 106*  BUN 26*  CREATININE 1.78*  CALCIUM 10.0  MG 2.0  GFRNONAA 36*  ANIONGAP 9    Recent Labs  Lab 10/10/21 1020  PROT 8.3*  ALBUMIN 3.8  AST 21  ALT 17  ALKPHOS 93  BILITOT 1.0   Lipids No results for input(s): CHOL, TRIG, HDL, LABVLDL, LDLCALC, CHOLHDL in the last 168 hours. Hematology Recent Labs  Lab 10/10/21 1020  WBC 5.6  RBC 5.50  HGB 16.7  HCT 50.3  MCV 91.5  MCH 30.4  MCHC 33.2  RDW 13.8  PLT 139*   Thyroid No results for input(s): TSH, FREET4 in the last 168 hours. BNP Recent Labs  Lab 10/10/21 1020  BNP 307.6*    DDimer No results for input(s): DDIMER in the last 168 hours.   Radiology/Studies:  DG Chest Portable 1 View  Result Date: 10/10/2021 CLINICAL DATA:  Near syncope EXAM: PORTABLE CHEST 1 VIEW COMPARISON:  None. FINDINGS: No axillary or supraclavicular adenopathy. No mediastinal or hilar adenopathy. No pericardial fluid. Esophagus normal. IMPRESSION: No acute cardiopulmonary process. Electronically Signed   By: Suzy Bouchard M.D.   On: 10/10/2021 10:47     Assessment and Plan:   Symptomatic bradycardia No reversible causes Will need pacer Dr. Lovena Le has seen and d/w the patient Will plan for PPM tomorrow  Paroxysmal Afib CHA2DS2Vasc is 6, on xarelto, appropriately dosed (Calc CrCl is 27) Can consider AAD post pacing, likely amio out patient once back on [facility] Though perhaps feeling poorly 2/2 bradycardia, ? Not the AFib Hold Xarelto for PPM  CAD Old CABG No CP, SOB  No ischemic looking EKG changes HS Trop 23  HTN BP stable Resume home meds post pacing   Risk Assessment/Risk Scores:    For questions or updates, please contact [facility] Please consult [email or web address] for contact info under     Signed, Baldwin Jamaica, PA-C  10/10/2021 1:47 PM   EP Attending  Patient seen and examined. Agree with above. The patient presents with symptomatic bradycardia and he has gone back into atrial fib with a slow VR at times in the 30's. He is not on any sinus or AV nodal slowing drugs. He has reverted back to atrial fib after DCCV. He had severe weakness today though he did  not have frank syncope. His exam is notable for a pleasant elderly man, NAD. Lungs are clear and the CV demonstrates and IRIR brady rhythm. Ext are warm with minimal edema. Tele demonstrates atrial fib with pauses and PVC's.  Symptomatic tachy-brady - he is slow today and on no AV nodal blocking drugs. I have discussed the treatment options and recommended PPM insertion.  Persistent atrial fib - he is back in atrial fib with a slow VR despite DCCV. He will be started on AA drugs after his PPM insertion and I would imagine he be cardioverted. Coags - he took his xarelto and he will hold this tonight. His coags will be restarted after his device is placed.   Carleene Overlie Ethelle Ola,MD

## 2021-10-10 NOTE — ED Triage Notes (Addendum)
Pt BIB EMS - was at home getting dressed when began to feel dizzy. No fall, palpitations or LOC, but dizziness continued so pt called 911. Reports "head spinning" sensation. VSS for EMS except HR in the 50s intermittently dropping into 20s with non-perfusing PVCs in bigeminy. Pt had outpt cardioversion 2/6.  170/80 50-60HR, 99% RA 120CGB

## 2021-10-11 ENCOUNTER — Encounter (HOSPITAL_COMMUNITY)
Admission: EM | Disposition: A | Discharge status: Home / Self Care | Payer: Self-pay | Source: Home / Self Care | Attending: Emergency Medicine

## 2021-10-11 ENCOUNTER — Observation Stay (HOSPITAL_COMMUNITY): Payer: Medicare Other

## 2021-10-11 ENCOUNTER — Ambulatory Visit (HOSPITAL_BASED_OUTPATIENT_CLINIC_OR_DEPARTMENT_OTHER): Admission: RE | Admit: 2021-10-11 | Payer: Medicare Other | Source: Ambulatory Visit

## 2021-10-11 ENCOUNTER — Encounter (HOSPITAL_COMMUNITY): Payer: Self-pay | Admitting: Internal Medicine

## 2021-10-11 DIAGNOSIS — I480 Paroxysmal atrial fibrillation: Secondary | ICD-10-CM | POA: Diagnosis not present

## 2021-10-11 DIAGNOSIS — I442 Atrioventricular block, complete: Secondary | ICD-10-CM | POA: Diagnosis not present

## 2021-10-11 DIAGNOSIS — R001 Bradycardia, unspecified: Secondary | ICD-10-CM | POA: Diagnosis not present

## 2021-10-11 DIAGNOSIS — Z20822 Contact with and (suspected) exposure to covid-19: Secondary | ICD-10-CM | POA: Diagnosis not present

## 2021-10-11 HISTORY — PX: PACEMAKER IMPLANT: EP1218

## 2021-10-11 SURGERY — PACEMAKER IMPLANT

## 2021-10-11 MED ORDER — LIDOCAINE HCL (PF) 1 % IJ SOLN
INTRAMUSCULAR | Status: DC | PRN
  Administered 2021-10-11: 60 mL

## 2021-10-11 MED ORDER — LIDOCAINE HCL 1 % IJ SOLN
INTRAMUSCULAR | Status: AC
  Filled 2021-10-11: qty 60

## 2021-10-11 MED ORDER — MIDAZOLAM HCL 5 MG/5ML IJ SOLN
INTRAMUSCULAR | Status: AC
  Filled 2021-10-11: qty 5

## 2021-10-11 MED ORDER — ONDANSETRON HCL 4 MG/2ML IJ SOLN: 4.0000 mg | Freq: Four times a day (QID) | INTRAMUSCULAR | Status: DC | PRN

## 2021-10-11 MED ORDER — VANCOMYCIN HCL IN DEXTROSE 1-5 GM/200ML-% IV SOLN
1000.0000 mg | Freq: Once | INTRAVENOUS | Status: AC
  Administered 2021-10-11: 1000 mg via INTRAVENOUS
  Filled 2021-10-11: qty 200

## 2021-10-11 MED ORDER — HEPARIN (PORCINE) IN NACL 2-0.9 UNITS/ML
INTRAMUSCULAR | Status: AC | PRN
  Administered 2021-10-11: 500 mL

## 2021-10-11 MED ORDER — SODIUM CHLORIDE 0.9 % IV SOLN
INTRAVENOUS | Status: AC
  Filled 2021-10-11: qty 2

## 2021-10-11 MED ORDER — FENTANYL CITRATE (PF) 100 MCG/2ML IJ SOLN
INTRAMUSCULAR | Status: DC | PRN
  Administered 2021-10-11: 12.5 ug via INTRAVENOUS

## 2021-10-11 MED ORDER — MIDAZOLAM HCL 5 MG/5ML IJ SOLN
INTRAMUSCULAR | Status: DC | PRN
  Administered 2021-10-11: 1 mg via INTRAVENOUS

## 2021-10-11 MED ORDER — VANCOMYCIN HCL IN DEXTROSE 1-5 GM/200ML-% IV SOLN
INTRAVENOUS | Status: AC
  Filled 2021-10-11: qty 200

## 2021-10-11 MED ORDER — FENTANYL CITRATE (PF) 100 MCG/2ML IJ SOLN
INTRAMUSCULAR | Status: AC
  Filled 2021-10-11: qty 2

## 2021-10-11 MED ORDER — METOPROLOL TARTRATE 5 MG/5ML IV SOLN
INTRAVENOUS | Status: AC
  Filled 2021-10-11: qty 5

## 2021-10-11 MED ORDER — CARVEDILOL 3.125 MG PO TABS: 3.1250 mg | ORAL_TABLET | Freq: Two times a day (BID) | ORAL | 5 refills | Status: DC

## 2021-10-11 MED ORDER — ACETAMINOPHEN 325 MG PO TABS: 325.0000 mg | ORAL_TABLET | ORAL | Status: DC | PRN

## 2021-10-11 MED ORDER — METOPROLOL TARTRATE 5 MG/5ML IV SOLN
INTRAVENOUS | Status: DC | PRN
  Administered 2021-10-11: 5 mg via INTRAVENOUS

## 2021-10-11 MED ORDER — HEPARIN (PORCINE) IN NACL 1000-0.9 UT/500ML-% IV SOLN
INTRAVENOUS | Status: AC
  Filled 2021-10-11: qty 500

## 2021-10-11 SURGICAL SUPPLY — 11 items
CABLE SURGICAL S-101-97-12 (CABLE) ×2 IMPLANT
CATH RIGHTSITE C315HIS02 (CATHETERS) ×1 IMPLANT
IPG PACE AZUR XT DR MRI W1DR01 (Pacemaker) IMPLANT
LEAD CAPSURE NOVUS 5076-52CM (Lead) ×1 IMPLANT
LEAD SELECT SECURE 3830 383069 (Lead) IMPLANT
PACE AZURE XT DR MRI W1DR01 (Pacemaker) ×2 IMPLANT
PAD DEFIB RADIO PHYSIO CONN (PAD) ×2 IMPLANT
SELECT SECURE 3830 383069 (Lead) ×2 IMPLANT
SHEATH 7FR PRELUDE SNAP 13 (SHEATH) ×2 IMPLANT
TRAY PACEMAKER INSERTION (PACKS) ×2 IMPLANT
WIRE HI TORQ VERSACORE-J 145CM (WIRE) ×1 IMPLANT

## 2021-10-11 NOTE — Progress Notes (Addendum)
Progress Note  Patient Name: Jose [name] Date of Encounter: 10/11/2021  [facility] Cardiologist: Kirk Ruths, MD   Subjective   Feels well  Inpatient Medications    Scheduled Meds:  diphenhydrAMINE  50 mg Oral Once   Or   diphenhydrAMINE  50 mg Intravenous Once   ezetimibe  10 mg Oral q morning   gentamicin irrigation  80 mg Irrigation On Call   pantoprazole  40 mg Oral Daily   pravastatin  80 mg Oral QHS   predniSONE  50 mg Oral Q6H   sodium chloride flush  3 mL Intravenous Q12H   Continuous Infusions:  sodium chloride 50 mL/hr at 10/11/21 0643   sodium chloride     vancomycin     PRN Meds: acetaminophen, nitroGLYCERIN, ondansetron (ZOFRAN) IV, sodium chloride flush   Vital Signs    Vitals:   10/10/21 1900 10/10/21 2042 10/10/21 2300 10/11/21 0248  BP: 135/85 (!) 157/69 (!) 129/59 (!) 155/84  Pulse: (!) 40 (!) 46 (!) 42 (!) 53  Resp: 18 18 16 19   Temp:  98.2 F (36.8 C) 97.7 F (36.5 C) 97.7 F (36.5 C)  TempSrc:  Oral Oral Oral  SpO2: 93% 95% 92% 93%  Weight:  78 kg    Height:  5\' 9"  (1.753 m)      Intake/Output Summary (Last 24 hours) at 10/11/2021 0751 Last data filed at 10/11/2021 0248 Gross per 24 hour  Intake 250 ml  Output 1375 ml  Net -1125 ml   Last 3 Weights 10/10/2021 10/03/2021 09/21/2021  Weight (lbs) 171 lb 15.3 oz 184 lb 1.4 oz 184 lb  Weight (kg) 78 kg 83.5 kg 83.462 kg      Telemetry    AFib w/occ PVCs, brief bigeminy at times, 40's-60's - Personally Reviewed  ECG    No new EKGs - Personally Reviewed  Physical Exam   GEN: No acute distress.   Neck: No JVD Cardiac: irreg-irreg, no murmurs, rubs, or gallops.  Respiratory: CTA b/l GI: Soft, nontender, non-distended  MS: No edema; No deformity. Neuro:  Nonfocal  Psych: Normal affect   Labs    High Sensitivity Troponin:   Recent Labs  Lab 10/10/21 1020 10/10/21 1312  TROPONINIHS 23* 24*     Chemistry Recent Labs  Lab 10/10/21 1020  NA 138  K 4.3  CL  101  CO2 28  GLUCOSE 106*  BUN 26*  CREATININE 1.78*  CALCIUM 10.0  MG 2.0  PROT 8.3*  ALBUMIN 3.8  AST 21  ALT 17  ALKPHOS 93  BILITOT 1.0  GFRNONAA 36*  ANIONGAP 9    Lipids No results for input(s): CHOL, TRIG, HDL, LABVLDL, LDLCALC, CHOLHDL in the last 168 hours.  Hematology Recent Labs  Lab 10/10/21 1020  WBC 5.6  RBC 5.50  HGB 16.7  HCT 50.3  MCV 91.5  MCH 30.4  MCHC 33.2  RDW 13.8  PLT 139*   Thyroid No results for input(s): TSH, FREET4 in the last 168 hours.  BNP Recent Labs  Lab 10/10/21 1020  BNP 307.6*    DDimer No results for input(s): DDIMER in the last 168 hours.   Radiology      Cardiac Studies   10/10/21: TTE IMPRESSIONS   1. Left ventricular ejection fraction, by estimation, is 45 to 50%. The  left ventricle has mildly decreased function. The left ventricle  demonstrates regional wall motion abnormalities (see scoring  diagram/findings for description). Basal to mid  inferolateral akinesis. There  is mild left ventricular hypertrophy. Left  ventricular diastolic parameters are indeterminate.   2. Right ventricular systolic function is moderately reduced. The right  ventricular size is normal. There is normal pulmonary artery systolic  pressure. The estimated right ventricular systolic pressure is 123456 mmHg.   3. Left atrial size was mildly dilated.   4. The mitral valve is normal in structure. Trivial mitral valve  regurgitation. No evidence of mitral stenosis.   5. The aortic valve is tricuspid. Aortic valve regurgitation is not  visualized. Aortic valve sclerosis/calcification is present, without any  evidence of aortic stenosis.   6. The inferior vena cava is normal in size with greater than 50%  respiratory variability, suggesting right atrial pressure of 3 mmHg.    11/23/15: TTE Study Conclusions  - Left ventricle: The cavity size was normal. There was mild    concentric hypertrophy. Systolic function was normal. The     estimated ejection fraction was in the range of 55% to 60%. Wall    motion was normal; there were no regional wall motion    abnormalities. Doppler parameters are consistent with abnormal    left ventricular relaxation (grade 1 diastolic dysfunction).  - Aortic valve: Transvalvular velocity was within the normal range.    There was no stenosis. There was no regurgitation.  - Mitral valve: Calcified annulus. There was trivial regurgitation.  - Left atrium: The atrium was moderately dilated.  - Right ventricle: The cavity size was normal. Wall thickness was    normal. Systolic function was mildly reduced.  - Atrial septum: No defect or patent foramen ovale was identified    by color flow Doppler.  - Tricuspid valve: There was mild regurgitation.  - Pulmonary arteries: Systolic pressure was within the normal    range. PA peak pressure: 32 mm Hg (S).  - Inferior vena cava: The vessel was normal in size. The    respirophasic diameter changes were in the normal range (>= 50%),    consistent with normal central venous pressure.   Patient Profile     88 y.o. male  with CAD (CABG 2007 w/PFO closure), HTN, HLD, AFib, stroke admitted with symptomatic bradycardia  Assessment & Plan    Symptomatic bradycardia due to high grade AV block No reversible causes PPM today  Anaphylaxis with contrast, pre-med in progress   Paroxysmal Afib CHA2DS2Vasc is 6, on xarelto, appropriately dosed (Calc CrCl is 27) Can consider AAD post pacing, likely amio out patient once back on uninterrupted [place] Though perhaps feeling poorly 2/2 bradycardia, ? Not the AFib Hold Xarelto for PPM   CAD Old CABG No CP, SOB  No ischemic looking EKG changes HS Trop 23, 24 WMA on his echo, noted on prior myoviews as well   HTN BP stable Resume home meds post pacing  5. Mild ICM Has had similar EF intermittently in the past Will resume his coreg post pacing   For questions or updates, please contact [place] Please consult [email or web address] for contact info under     Signed, Baldwin Jamaica, PA-C  10/11/2021, 7:51 AM    EP Attending  Patient seen and examined. Agree with the findings as noted above. The patient is stable this morning though anxious about PPM insertion. I have reviewed the indications/risks/benefits/goals/expectations of PPM insertion and he wishes to proceed.  Carleene Overlie Brayan [place]ot[place],MD

## 2021-10-11 NOTE — H&P (View-Only) (Signed)
Progress Note  Patient Name: Jose [name] Date of Encounter: 10/11/2021  [facility] Cardiologist: Kirk Ruths, MD   Subjective   Feels well  Inpatient Medications    Scheduled Meds:  diphenhydrAMINE  50 mg Oral Once   Or   diphenhydrAMINE  50 mg Intravenous Once   ezetimibe  10 mg Oral q morning   gentamicin irrigation  80 mg Irrigation On Call   pantoprazole  40 mg Oral Daily   pravastatin  80 mg Oral QHS   predniSONE  50 mg Oral Q6H   sodium chloride flush  3 mL Intravenous Q12H   Continuous Infusions:  sodium chloride 50 mL/hr at 10/11/21 0643   sodium chloride     vancomycin     PRN Meds: acetaminophen, nitroGLYCERIN, ondansetron (ZOFRAN) IV, sodium chloride flush   Vital Signs    Vitals:   10/10/21 1900 10/10/21 2042 10/10/21 2300 10/11/21 0248  BP: 135/85 (!) 157/69 (!) 129/59 (!) 155/84  Pulse: (!) 40 (!) 46 (!) 42 (!) 53  Resp: 18 18 16 19   Temp:  98.2 F (36.8 C) 97.7 F (36.5 C) 97.7 F (36.5 C)  TempSrc:  Oral Oral Oral  SpO2: 93% 95% 92% 93%  Weight:  78 kg    Height:  5\' 9"  (1.753 m)      Intake/Output Summary (Last 24 hours) at 10/11/2021 0751 Last data filed at 10/11/2021 0248 Gross per 24 hour  Intake 250 ml  Output 1375 ml  Net -1125 ml   Last 3 Weights 10/10/2021 10/03/2021 09/21/2021  Weight (lbs) 171 lb 15.3 oz 184 lb 1.4 oz 184 lb  Weight (kg) 78 kg 83.5 kg 83.462 kg      Telemetry    AFib w/occ PVCs, brief bigeminy at times, 40's-60's - Personally Reviewed  ECG    No new EKGs - Personally Reviewed  Physical Exam   GEN: No acute distress.   Neck: No JVD Cardiac: irreg-irreg, no murmurs, rubs, or gallops.  Respiratory: CTA b/l GI: Soft, nontender, non-distended  MS: No edema; No deformity. Neuro:  Nonfocal  Psych: Normal affect   Labs    High Sensitivity Troponin:   Recent Labs  Lab 10/10/21 1020 10/10/21 1312  TROPONINIHS 23* 24*     Chemistry Recent Labs  Lab 10/10/21 1020  NA 138  K 4.3  CL  101  CO2 28  GLUCOSE 106*  BUN 26*  CREATININE 1.78*  CALCIUM 10.0  MG 2.0  PROT 8.3*  ALBUMIN 3.8  AST 21  ALT 17  ALKPHOS 93  BILITOT 1.0  GFRNONAA 36*  ANIONGAP 9    Lipids No results for input(s): CHOL, TRIG, HDL, LABVLDL, LDLCALC, CHOLHDL in the last 168 hours.  Hematology Recent Labs  Lab 10/10/21 1020  WBC 5.6  RBC 5.50  HGB 16.7  HCT 50.3  MCV 91.5  MCH 30.4  MCHC 33.2  RDW 13.8  PLT 139*   Thyroid No results for input(s): TSH, FREET4 in the last 168 hours.  BNP Recent Labs  Lab 10/10/21 1020  BNP 307.6*    DDimer No results for input(s): DDIMER in the last 168 hours.   Radiology      Cardiac Studies   10/10/21: TTE IMPRESSIONS   1. Left ventricular ejection fraction, by estimation, is 45 to 50%. The  left ventricle has mildly decreased function. The left ventricle  demonstrates regional wall motion abnormalities (see scoring  diagram/findings for description). Basal to mid  inferolateral akinesis. There  is mild left ventricular hypertrophy. Left  ventricular diastolic parameters are indeterminate.   2. Right ventricular systolic function is moderately reduced. The right  ventricular size is normal. There is normal pulmonary artery systolic  pressure. The estimated right ventricular systolic pressure is 123456 mmHg.   3. Left atrial size was mildly dilated.   4. The mitral valve is normal in structure. Trivial mitral valve  regurgitation. No evidence of mitral stenosis.   5. The aortic valve is tricuspid. Aortic valve regurgitation is not  visualized. Aortic valve sclerosis/calcification is present, without any  evidence of aortic stenosis.   6. The inferior vena cava is normal in size with greater than 50%  respiratory variability, suggesting right atrial pressure of 3 mmHg.    11/23/15: TTE Study Conclusions  - Left ventricle: The cavity size was normal. There was mild    concentric hypertrophy. Systolic function was normal. The     estimated ejection fraction was in the range of 55% to 60%. Wall    motion was normal; there were no regional wall motion    abnormalities. Doppler parameters are consistent with abnormal    left ventricular relaxation (grade 1 diastolic dysfunction).  - Aortic valve: Transvalvular velocity was within the normal range.    There was no stenosis. There was no regurgitation.  - Mitral valve: Calcified annulus. There was trivial regurgitation.  - Left atrium: The atrium was moderately dilated.  - Right ventricle: The cavity size was normal. Wall thickness was    normal. Systolic function was mildly reduced.  - Atrial septum: No defect or patent foramen ovale was identified    by color flow Doppler.  - Tricuspid valve: There was mild regurgitation.  - Pulmonary arteries: Systolic pressure was within the normal    range. PA peak pressure: 32 mm Hg (S).  - Inferior vena cava: The vessel was normal in size. The    respirophasic diameter changes were in the normal range (>= 50%),    consistent with normal central venous pressure.   Patient Profile     88 y.o. male  with CAD (CABG 2007 w/PFO closure), HTN, HLD, AFib, stroke admitted with symptomatic bradycardia  Assessment & Plan    Symptomatic bradycardia due to high grade AV block No reversible causes PPM today  Anaphylaxis with contrast, pre-med in progress   Paroxysmal Afib CHA2DS2Vasc is 6, on xarelto, appropriately dosed (Calc CrCl is 27) Can consider AAD post pacing, likely amio out patient once back on uninterrupted [place] Though perhaps feeling poorly 2/2 bradycardia, ? Not the AFib Hold Xarelto for PPM   CAD Old CABG No CP, SOB  No ischemic looking EKG changes HS Trop 23, 24 WMA on his echo, noted on prior myoviews as well   HTN BP stable Resume home meds post pacing  5. Mild ICM Has had similar EF intermittently in the past Will resume his coreg post pacing   For questions or updates, please contact [place] Please consult [email or web address] for contact info under     Signed, Baldwin Jamaica, PA-C  10/11/2021, 7:51 AM    EP Attending  Patient seen and examined. Agree with the findings as noted above. The patient is stable this morning though anxious about PPM insertion. I have reviewed the indications/risks/benefits/goals/expectations of PPM insertion and he wishes to proceed.  Carleene Overlie Araceli Coufal,MD

## 2021-10-11 NOTE — Plan of Care (Signed)

## 2021-10-11 NOTE — Progress Notes (Signed)
Returned from cath lab, clean dry pressure dressing over left upper chest

## 2021-10-11 NOTE — Interval H&P Note (Signed)
History and Physical Interval Note:  10/11/2021 8:46 AM  Jose [name]  has presented today for surgery, with the diagnosis of bradicardia.  The various methods of treatment have been discussed with the patient and family. After consideration of risks, benefits and other options for treatment, the patient has consented to  Procedure(s): PACEMAKER IMPLANT (N/A) as a surgical intervention.  The patient's history has been reviewed, patient examined, no change in status, stable for surgery.  I have reviewed the patient's chart and labs.  Questions were answered to the patient's satisfaction.     Lewayne Bunting

## 2021-10-11 NOTE — Discharge Summary (Addendum)
ELECTROPHYSIOLOGY PROCEDURE DISCHARGE SUMMARY    Patient ID: Jose [name],  MRN: [id number], DOB/AGE: 1930-11-11 88 y.o.  Admit date: 10/10/2021 Discharge date: 10/11/2021  Primary Care Physician: Derinda Late, MD  Primary Cardiologist: Dr. Stanford Breed Electrophysiologist: new, dr. Lovena Le  Primary Discharge Diagnosis:  Symptomatic bradycardia status post pacemaker implantation this admission  Secondary Discharge Diagnosis:  CAD Remote CABG and PFO closure Paroxysmal  Afib CHA2DS2Vasc is 6, on Xarelto HTN  Allergies  Allergen Reactions   Cephalexin Anaphylaxis    Pt says he has tolerated PCN in the past   Iodinated Contrast Media Anaphylaxis   Iohexol Anaphylaxis     THROAT SWELLS AND STOPS BREATHING    Ace Inhibitors Cough   Metoprolol Other (See Comments)    Erectile dysfunction   Viagra [Sildenafil Citrate] Other (See Comments)    Headache and vision changes   Ciprofloxacin Hives, Itching and Rash   Codeine Nausea And Vomiting and Other (See Comments)    headaches   Sulfa Antibiotics Rash, Hives and Itching     Procedures This Admission:  1.  Implantation of a MDT dual chamber PPM on 10/11/21 by Dr Lovena Le.  The patient received  Medtronic (serial number [id number]) pacemaker, Medtronic (serial number [id number]) right atrial lead and a Medtronic (serial number [id number]) right ventricular lead There were no immediate post procedure complications. 2.  CXR on 10/11/21 post procedure completed  Brief HPI: Jose [name] is a 88 y.o. male w/PMHx including above came to the hospital with progressive weakness, unusual fatigue and new dizzy spells, near syncope, found in Afib w/SVR.  Hospital Course:  TEP was consulted, labs unremarkable and no reversible causes for his bradycardia noted, was recommended to have PPM  and was admitted. TTE noted LVEF 45-50% w/WMA (also noted on historical myoviews) He underwent implantation of a PPM with details as outlined  above.  He was monitored on telemetry through his stay with Afib SVR/CVR > Afib w/V pacing.  Left chest was without hematoma or ecchymosis.  The device was interrogated  prior to discharge and found to be functioning normally.  CXR was obtained and demonstrated no pneumothorax status post device implantation.  Wound care, arm mobility, and restrictions were reviewed with the patient.  The patient feels well, denies any CP/SOB, with  minimal site discomfort.  He was planned for discharge same day post pacing by Dr. Lovena Le, considered stable for discharge to home, once CXR is read and negative for PTX.    Resume prior coreg 3.125mg  BID  do not restart Xarelto until 10/16/21   EP follow up is in place to visit rhythm control options/plans once back on his [place] uninterrupted  Physical Exam: Vitals:   10/11/21 1008 10/11/21 1013 10/11/21 1153 10/11/21 1528  BP:  (!) 156/88 (!) 154/84 126/71  Pulse: (!) 0 60 60 60  Resp:  17 18 20   Temp:  97.6 F (36.4 C) (!) 97.5 F (36.4 C) 97.8 F (36.6 C)  TempSrc:  Oral Axillary Oral  SpO2:  94% 92% 92%  Weight:      Height:        GEN- The patient is well appearing, alert and oriented x 3 today.   HEENT: normocephalic, atraumatic; sclera clear, conjunctiva pink; hearing intact; oropharynx clear; neck supple, no JVP Lungs-  CTA b/l, normal work of breathing.  No wheezes, rales, rhonchi Heart- RRR (paced), no murmurs, rubs or gallops, PMI not laterally displaced GI- soft, non-tender, non-distended Extremities- no clubbing, cyanosis,  or edema MS- no significant deformity or atrophy Skin- warm and dry, no rash or lesion,  left chest without hematoma/ecchymosis Psych- euthymic mood, full affect Neuro- no gross deficits   Labs:   Lab Results  Component Value Date   WBC 5.6 10/10/2021   HGB 16.7 10/10/2021   HCT 50.3 10/10/2021   MCV 91.5 10/10/2021   PLT 139 (L) 10/10/2021    Recent Labs  Lab 10/10/21 1020  NA 138  K 4.3  CL 101  CO2 28   BUN 26*  CREATININE 1.78*  CALCIUM 10.0  PROT 8.3*  BILITOT 1.0  ALKPHOS 93  ALT 17  AST 21  GLUCOSE 106*    Discharge Medications:  Allergies as of 10/11/2021       Reactions   Cephalexin Anaphylaxis   Pt says he has tolerated PCN in the past   Iodinated Contrast Media Anaphylaxis   Iohexol Anaphylaxis    THROAT SWELLS AND STOPS BREATHING   Ace Inhibitors Cough   Metoprolol Other (See Comments)   Erectile dysfunction   Viagra [sildenafil Citrate] Other (See Comments)   Headache and vision changes   Ciprofloxacin Hives, Itching, Rash   Codeine Nausea And Vomiting, Other (See Comments)   headaches   Sulfa Antibiotics Rash, Hives, Itching        Medication List     TAKE these medications    acetaminophen 650 MG CR tablet Commonly known as: TYLENOL Take 650 mg by mouth every 8 (eight) hours as needed for pain.   carvedilol 3.125 MG tablet Commonly known as: Coreg Take 1 tablet (3.125 mg total) by mouth 2 (two) times daily with a meal.   cholecalciferol 25 MCG (1000 UNIT) tablet Commonly known as: VITAMIN D Take 1,000 Units by mouth in the morning.   COD LIVER OIL PO Take 1 capsule by mouth at bedtime.   ezetimibe 10 MG tablet Commonly known as: ZETIA Take 10 mg by mouth every morning.   furosemide 20 MG tablet Commonly known as: LASIX Take 2 tablets (40 mg total) by mouth daily. What changed: when to take this   losartan 100 MG tablet Commonly known as: COZAAR Take 100 mg by mouth in the morning.   multivitamin with minerals Tabs tablet Take 1 tablet by mouth in the morning.   omeprazole 20 MG capsule Commonly known as: PRILOSEC Take 20 mg by mouth every other day. In the morning.   pravastatin 80 MG tablet Commonly known as: PRAVACHOL Take 80 mg by mouth at bedtime.   saw palmetto 160 MG capsule Take 160 mg by mouth 2 (two) times daily.   SYSTANE ULTRA OP Place 1-2 drops into both eyes in the morning and at bedtime.   tadalafil 5 MG  tablet Commonly known as: CIALIS Take 5 mg by mouth at bedtime.   TESTOSTERONE TD Place 1 mL onto the skin See admin instructions. 10% testosterone cream compounded at [place]  - apply 1 ml topically to shoulders once every morning   Xarelto 15 MG Tabs tablet Generic drug: Rivaroxaban TAKE ONE (1) TABLET BY MOUTH EACH DAY WITH SUPPER What changed: See the new instructions. Notes to patient: Do not resume until Sunday 10/16/21        Disposition: Home   Duration of Discharge Encounter: Greater than 30 minutes including physician time.  Venetia Night, PA-C 10/11/2021 4:54 PM  EP Attending  Patient seen and examined. Agree with above. The patient is doing well after PPM insertion earlier  today. Interrogation of his DDD PM is working normally. His CXR looks good with no PTX. He will be discharged home on his beta blocker. After restarting his [place] we will consider amiodarone now that his PPM is in place.   Carleene Overlie Sheelah Ritacco,MD

## 2021-10-11 NOTE — TOC Initial Note (Signed)
Transition of Care [facility]) - Initial/Assessment Note    Patient Details  Name: Jose [name] MRN: [id number] Date of Birth: 06/18/1931  Transition of Care [facility]) CM/SW Contact:    Angelita Ingles, RN Phone Number:[phone]  10/11/2021, 12:10 PM  Clinical Narrative:                  Transition of Care [facility]) Screening Note   Patient Details  Name: Jose [name] Date of Birth: 1931-07-31   Transition of Care [facility]) CM/SW Contact:    Angelita Ingles, RN Phone Number: 10/11/2021, 12:10 PM    Transition of Care [facility]) has reviewed patient and no TOC needs have been identified at this time. We will continue to monitor patient advancement through interdisciplinary progression rounds. If new patient transition needs arise, please place a TOC consult.          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   Interstitial lung disease ([place]) 11/27/2015   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   Elevated troponin 11/27/2015   Hydronephrosis, right 11/27/2015   SOB (shortness of breath)    Acute respiratory failure with hypoxia (HCC) 11/26/2015   Chronic kidney disease (CKD), stage III (moderate) (HCC) 11/26/2015   Acute on chronic diastolic (congestive) heart failure (HCC)    Lobar pneumonia due to  unspecified organism    Acute on chronic combined systolic (congestive) and diastolic (congestive) heart failure ([place]) 11/22/2015   Atrial fibrillation ([place]) 09/10/2013   Occlusion and stenosis of vertebral artery with cerebral infarction ([place]) 07/09/2013   Visual field loss 07/09/2013   HYPERLIPIDEMIA-MIXED 12/23/2009   CAD, NATIVE VESSEL 12/15/2009   ERECTILE DYSFUNCTION 12/10/2008   Essential hypertension 12/10/2008   FATIGUE 12/10/2008   PCP:  Derinda Late, MD Pharmacy:   [place], [place] - [address] [place] [address] Phone: [phone] Fax: [place], [place] [address] [place] [address] Phone: [phone] Fax: [phone]     Social Determinants of Health (SDOH) Interventions    Readmission Risk Interventions No flowsheet data found.

## 2021-10-11 NOTE — Discharge Instructions (Signed)
° ° °  Supplemental Discharge Instructions for  Pacemaker/Defibrillator Patients  Tomorrow, 10/12/21, send in a device transmission  Activity No heavy lifting or vigorous activity with your left/right arm for 6 to 8 weeks.  Do not raise your left/right arm above your head for one week.  Gradually raise your affected arm as drawn below.              10/16/21                    10/17/21                   10/18/21                   10/19/21 __  NO DRIVING for 1 week   ; you may begin driving on   01/05/24 .  WOUND CARE Keep the wound area clean and dry.  Do not get this area wet , no showers for one week; you may shower on     . Tomorrow, 10/12/21, remove the arm sling Tomorrow, 10/12/21 remove the outer plastic bandage.  Underneath the plastic bandage there are steri strips (paper tapes), DO NOT remove these. The tape/steri-strips on your wound will fall off; do not pull them off.  No bandage is needed on the site.  DO  NOT apply any creams, oils, or ointments to the wound area. If you notice any drainage or discharge from the wound, any swelling or bruising at the site, or you develop a fever > 101? F after you are discharged home, call the office at once.  Special Instructions You are still able to use cellular telephones; use the ear opposite the side where you have your pacemaker/defibrillator.  Avoid carrying your cellular phone near your device. When traveling through airports, show security personnel your identification card to avoid being screened in the metal detectors.  Ask the security personnel to use the hand wand. Avoid arc welding equipment, MRI testing (magnetic resonance imaging), TENS units (transcutaneous nerve stimulators).  Call the office for questions about other devices. Avoid electrical appliances that are in poor condition or are not properly grounded. Microwave ovens are safe to be near or to operate.

## 2021-10-11 NOTE — Progress Notes (Signed)
Patient via bed to cath lab for pacemaker placement

## 2021-10-14 ENCOUNTER — Encounter: Payer: Self-pay | Admitting: Family

## 2021-10-14 NOTE — Progress Notes (Signed)
Office Visit Note   Patient: Jose [name]           Date of Birth: [birth year]           MRN: [id number] Visit Date: 09/16/2021              Requested by: Derinda Late, MD [address] [place],  [place] [address] PCP: Derinda Late, MD  Chief Complaint  Patient presents with   Left Foot - Follow-up   Right Foot - Follow-up       HPI: The patient is an 88 year old gentleman who presents today for bilateral foot evaluation.  He is unable to safely trim his own nails   He has no new concerns    Assessment & Plan: Visit Diagnoses:  1. Onychomycosis     Plan: Onychomycotic nails were trimmed x10.  Patient tolerated well.  We will follow-up in 3 months  Follow-Up Instructions: Return in about 3 months (around 12/15/2021).   Ortho Exam  Patient is alert, oriented, no adenopathy, well-dressed, normal affect, normal respiratory effort.  On examination bilateral feet there is no swelling no erythema no warmth.  He does have thickened and discolored onychomycotic nails x10.  These were trimmed today without incident.   Imaging: No results found. No images are attached to the encounter.  Labs: Lab Results  Component Value Date   HGBA1C 6.2 (H) 07/10/2013   ESRSEDRATE 12 01/13/2016   ESRSEDRATE 20 12/21/2015   ESRSEDRATE 94 (H) 11/26/2015   REPTSTATUS 11/24/2015 FINAL 11/24/2015   REPTSTATUS 11/28/2015 FINAL 11/24/2015   GRAMSTAIN  11/24/2015    ABUNDANT WBC PRESENT,BOTH PMN AND MONONUCLEAR FEW SQUAMOUS EPITHELIAL CELLS PRESENT MODERATE GRAM POSITIVE COCCI IN PAIRS MODERATE GRAM NEGATIVE RODS Performed at [facility]    CULT  11/24/2015    NORMAL OROPHARYNGEAL FLORA Performed at [facility]      Lab Results  Component Value Date   ALBUMIN 3.8 10/10/2021   ALBUMIN 4.3 09/28/2021   ALBUMIN 4.3 10/21/2020    Lab Results  Component Value Date   MG 2.0 10/10/2021   MG 1.6 (L) 11/22/2015   No results found for: VD25OH  No  results found for: PREALBUMIN CBC EXTENDED Latest Ref Rng & Units 10/10/2021 09/28/2021 10/21/2020  WBC 4.0 - 10.5 K/uL 5.6 5.5 5.2  RBC 4.22 - 5.81 MIL/uL 5.50 5.52 5.35  HGB 13.0 - 17.0 g/dL 16.7 16.5 16.7  HCT 39.0 - 52.0 % 50.3 49.9 49.6  PLT 150 - 400 K/uL 139(L) 125(L) 116(L)  NEUTROABS 1.7 - 7.7 K/uL 3.7 - -  LYMPHSABS 0.7 - 4.0 K/uL 1.2 - -     There is no height or weight on file to calculate BMI.  Orders:  No orders of the defined types were placed in this encounter.  No orders of the defined types were placed in this encounter.    Procedures: No procedures performed  Clinical Data: No additional findings.  ROS:  All other systems negative, except as noted in the HPI. Review of Systems  Objective: Vital Signs: There were no vitals taken for this visit.  Specialty Comments:  No specialty comments available.  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   Interstitial lung disease ([place]) 11/27/2015   BOOP (bronchiolitis obliterans with organizing pneumonia) ([place]) 11/27/2015  Elevated troponin 11/27/2015   Hydronephrosis, right 11/27/2015   SOB (shortness of breath)    Acute respiratory failure with hypoxia (HCC) 11/26/2015   Chronic kidney disease (CKD), stage III (moderate) (HCC) 11/26/2015   Acute on chronic diastolic (congestive) heart failure (HCC)    Lobar pneumonia due to unspecified organism    Acute on chronic combined systolic (congestive) and diastolic (congestive) heart failure ([place]) 11/22/2015   Atrial fibrillation ([place]) 09/10/2013   Occlusion and stenosis of vertebral artery with cerebral infarction ([place]) 07/09/2013   Visual field loss 07/09/2013   HYPERLIPIDEMIA-MIXED 12/23/2009   CAD, NATIVE VESSEL 12/15/2009   ERECTILE DYSFUNCTION 12/10/2008    Essential hypertension 12/10/2008   FATIGUE 12/10/2008   Past Medical History:  Diagnosis Date   At risk for sleep apnea    STOP-BANG= 5   SENT TO PCP 05-20-2014   Bilateral carotid artery stenosis    mild bilateral proximcal ICA  40% per duplex 11/ 2014   Coronary artery disease    a. s/p CABG in 2007 w/ LIMA-LAD, SVG-D1, SVG-1st & 2nd Mrg, SVG-PDA and posterior lateral   ED (erectile dysfunction)    Eye infection 12/2014   GERD (gastroesophageal reflux disease)    History of embolic stroke no residual   11/ 2014  --  right PCA and MCA branch infarts (left side vision difficulties)   Hypertension    Other malaise and fatigue    Paroxysmal atrial fibrillation ([place])    Pneumonia 10/2015   led to CHF, in hospital x 6 days   S/P CABG x 6    2007   Ureteral obstruction, right     Family History  Problem Relation Age of Onset   Heart disease Mother        PPM   Hypertension Mother    Lymphoma Father    Prostate cancer Father    Prostate cancer Brother    Aneurysm Brother        REPAIR   CAD Brother        CABG   Other Brother        STENTS IN LEGS   Other Sister        STOMACH ISSUES   Depression Sister    Lung cancer Brother        Asbestosis    Past Surgical History:  Procedure Laterality Date   APPENDECTOMY  1950's   CARDIAC CATHETERIZATION  10-31-2005  dr wall   severe three vessel/  perserved LV   CARDIOVASCULAR STRESS TEST  last one 05-19-2014  dr Stanford Breed   low risk lexiscan no exercise study/ small inferobasal infarct with no ischemia /  ef 53%   CARDIOVERSION N/A 10/03/2021   Procedure: CARDIOVERSION;  Surgeon: Werner Lean, MD;  Location: [place] [facility];  Service: Cardiovascular;  Laterality: N/A;   CORONARY ARTERY BYPASS GRAFT  11-01-2005  dr Roxy Manns   CLOSURE OF PFO/  LIMA to LAD,  SVG to D1,  SVG to 1st & 2nd  MARGINAL branches, SVG  to PDA and posterior lateral   CYSTOSCOPY W/ URETERAL STENT PLACEMENT Right 05/21/2014   Procedure: CYSTOSCOPY WITH  RETROGRADE PYELOGRAM/URETERAL STENT PLACEMENT;  Surgeon: Ailene Rud, MD;  Location: [facility];  Service: Urology;  Laterality: Right;   CYSTOSCOPY WITH URETEROSCOPY Right 05/21/2014   Procedure: CYSTOSCOPY WITH URETEROSCOPY;  Surgeon: Ailene Rud, MD;  Location: [facility];  Service: Urology;  Laterality: Right;   INGUINAL HERNIA REPAIR Bilateral    LAPAROSCOPIC  CHOLECYSTECTOMY  10-02-2000   PACEMAKER IMPLANT N/A 10/11/2021   Procedure: PACEMAKER IMPLANT;  Surgeon: Evans Lance, MD;  Location: [facility];  Service: Cardiovascular;  Laterality: N/A;   RIGHT URETER OBSTRUCTION SURGERY  age 80   TEE WITHOUT CARDIOVERSION N/A 07/17/2013   Procedure: TRANSESOPHAGEAL ECHOCARDIOGRAM (TEE);  Surgeon: Josue Hector, MD;  [facility];  Service: Cardiovascular;  Laterality: N/A;  normal LV size, mild prolapse of posterior mitral valve leaflet,  mild MR and TR, normal AV,  no LAA thrombus,  atrial septal aneurysm with no PFO/ ASD and negative bubble study, normal RV, normal aorta with no debris   Social History   Occupational History   Occupation: retired  Tobacco Use   Smoking status: Former    Packs/day: 1.00    Years: 35.00    Pack years: 35.00    Types: Cigarettes    Quit date: 08/29/1977    Years since quitting: 44.1   Smokeless tobacco: Never  Substance and Sexual Activity   Alcohol use: No    Alcohol/week: 0.0 standard drinks   Drug use: No   Sexual activity: Not on file

## 2021-10-26 ENCOUNTER — Ambulatory Visit (INDEPENDENT_AMBULATORY_CARE_PROVIDER_SITE_OTHER): Payer: Medicare Other

## 2021-10-26 ENCOUNTER — Other Ambulatory Visit: Payer: Self-pay

## 2021-10-26 DIAGNOSIS — I5033 Acute on chronic diastolic (congestive) heart failure: Secondary | ICD-10-CM

## 2021-10-26 LAB — CUP PACEART INCLINIC DEVICE CHECK
Battery Remaining Longevity: 151 mo
Battery Voltage: 3.21 V
Brady Statistic RA Percent Paced: 0.24 %
Brady Statistic RV Percent Paced: 95.82 %
Date Time Interrogation Session: 20230301152405
Implantable Lead Implant Date: 20230214
Implantable Lead Implant Date: 20230214
Implantable Lead Location: 753859
Implantable Lead Location: 753860
Implantable Lead Model: 3830
Implantable Lead Model: 5076
Implantable Pulse Generator Implant Date: 20230214
Lead Channel Impedance Value: 418 Ohm
Lead Channel Impedance Value: 437 Ohm
Lead Channel Impedance Value: 608 Ohm
Lead Channel Impedance Value: 817 Ohm
Lead Channel Pacing Threshold Amplitude: 0.5 V
Lead Channel Pacing Threshold Pulse Width: 0.4 ms
Lead Channel Sensing Intrinsic Amplitude: 1.375 mV
Lead Channel Sensing Intrinsic Amplitude: 16.375 mV
Lead Channel Sensing Intrinsic Amplitude: 2 mV
Lead Channel Sensing Intrinsic Amplitude: 20.75 mV
Lead Channel Setting Pacing Amplitude: 3.5 V
Lead Channel Setting Pacing Amplitude: 3.5 V
Lead Channel Setting Pacing Pulse Width: 0.4 ms
Lead Channel Setting Sensing Sensitivity: 0.9 mV

## 2021-10-26 NOTE — Patient Instructions (Signed)

## 2021-10-26 NOTE — Progress Notes (Signed)
Wound check appointment. Steri-strips removed. Wound without redness or edema. Incision edges approximated, wound well healed. Normal device function. Thresholds, sensing, and impedances consistent with implant measurements. Device programmed at 3.5V/auto capture programmed on for extra safety margin until 3 month visit. Histogram distribution appropriate for patient and level of activity. AT/AF burden 100%, OAC on file. No high ventricular rates noted. Patient educated about wound care, arm mobility, lifting restrictions. ROV in 3 months with implanting physician. ?

## 2021-11-01 NOTE — H&P (View-Only) (Signed)
? ? ?Electrophysiology Office Note ?Date: 11/01/2021 ? ?ID:  Jose [name], DOB November 02, 1930, MRN [id number] ? ?PCP: Derinda Late, MD ?Primary Cardiologist: Kirk Ruths, MD ?Electrophysiologist: Cristopher Peru, MD  ? ?CC: Pacemaker follow-up ? ?Jose [name] is a 88 y.o. male seen today for Cristopher Peru, MD for routine electrophysiology followup.  Since last being seen in our clinic the patient reports doing well overall. He doesn't particularly notice when he is out of rhythm. He has had intermittent fatigue and malaise; though unclear if it was due to bradycardia or atrial fibrillation. It has NOT been as prominent since pacing. He does occasionally feel fluttering. he denies chest pain, dyspnea, PND, orthopnea, nausea, vomiting, dizziness, syncope, edema, weight gain, or early satiety. ? ?Device History: ?Medtronic Dual Chamber PPM implanted 10/11/2021 for symptomatic bradycardia ? ?Past Medical History:  ?Diagnosis Date  ? At risk for sleep apnea   ? STOP-BANG= 5   SENT TO PCP 05-20-2014  ? Bilateral carotid artery stenosis   ? mild bilateral proximcal ICA  40% per duplex 11/ 2014  ? Coronary artery disease   ? a. s/p CABG in 2007 w/ LIMA-LAD, SVG-D1, SVG-1st & 2nd Mrg, SVG-PDA and posterior lateral  ? ED (erectile dysfunction)   ? Eye infection 12/2014  ? GERD (gastroesophageal reflux disease)   ? History of embolic stroke no residual  ? 11/ 2014  --  right PCA and MCA branch infarts (left side vision difficulties)  ? Hypertension   ? Other malaise and fatigue   ? Paroxysmal atrial fibrillation (HCC)   ? Pneumonia 10/2015  ? led to CHF, in hospital x 6 days  ? S/P CABG x 6   ? 2007  ? Ureteral obstruction, right   ? ?Past Surgical History:  ?Procedure Laterality Date  ? APPENDECTOMY  1950's  ? CARDIAC CATHETERIZATION  10-31-2005  dr wall  ? severe three vessel/  perserved LV  ? CARDIOVASCULAR STRESS TEST  last one 05-19-2014  dr Stanford Breed  ? low risk lexiscan no exercise study/ small inferobasal infarct with  no ischemia /  ef 53%  ? CARDIOVERSION N/A 10/03/2021  ? Procedure: CARDIOVERSION;  Surgeon: Werner Lean, MD;  Location: [place];  Service: Cardiovascular;  Laterality: N/A;  ? CORONARY ARTERY BYPASS GRAFT  11-01-2005  dr Roxy Manns  ? CLOSURE OF PFO/  LIMA to LAD,  SVG to D1,  SVG to 1st & 2nd  MARGINAL branches, SVG  to PDA and posterior lateral  ? CYSTOSCOPY W/ URETERAL STENT PLACEMENT Right 05/21/2014  ? Procedure: CYSTOSCOPY WITH RETROGRADE PYELOGRAM/URETERAL STENT PLACEMENT;  Surgeon: Ailene Rud, MD;  Location: [facility];  Service: Urology;  Laterality: Right;  ? CYSTOSCOPY WITH URETEROSCOPY Right 05/21/2014  ? Procedure: CYSTOSCOPY WITH URETEROSCOPY;  Surgeon: Ailene Rud, MD;  Location: [facility];  Service: Urology;  Laterality: Right;  ? INGUINAL HERNIA REPAIR Bilateral   ? LAPAROSCOPIC CHOLECYSTECTOMY  10-02-2000  ? PACEMAKER IMPLANT N/A 10/11/2021  ? Procedure: PACEMAKER IMPLANT;  Surgeon: Evans Lance, MD;  Location: [place] [facility];  Service: Cardiovascular;  Laterality: N/A;  ? RIGHT URETER OBSTRUCTION SURGERY  age 87  ? TEE WITHOUT CARDIOVERSION N/A 07/17/2013  ? Procedure: TRANSESOPHAGEAL ECHOCARDIOGRAM (TEE);  Surgeon: Josue Hector, MD;  Location: [facility] ENDOSCOPY;  Service: Cardiovascular;  Laterality: N/A;  normal LV size, mild prolapse of posterior mitral valve leaflet,  mild MR and TR, normal AV,  no LAA thrombus,  atrial septal aneurysm with no  PFO/ ASD and negative bubble study, normal RV, normal aorta with no debris  ? ? ?Current Outpatient Medications  ?Medication Sig Dispense Refill  ? acetaminophen (TYLENOL) 650 MG CR tablet Take 650 mg by mouth every 8 (eight) hours as needed for pain.    ? carvedilol (COREG) 3.125 MG tablet Take 1 tablet (3.125 mg total) by mouth 2 (two) times daily with a meal. 60 tablet 5  ? cholecalciferol (VITAMIN D) 25 MCG (1000 UNIT) tablet Take 1,000 Units by mouth in the morning.    ? COD LIVER OIL PO  Take 1 capsule by mouth at bedtime.     ? ezetimibe (ZETIA) 10 MG tablet Take 10 mg by mouth every morning.    ? furosemide (LASIX) 20 MG tablet Take 2 tablets (40 mg total) by mouth daily. (Patient taking differently: Take 40 mg by mouth every morning.) 180 tablet 3  ? losartan (COZAAR) 100 MG tablet Take 100 mg by mouth in the morning.    ? Multiple Vitamin (MULTIVITAMIN WITH MINERALS) TABS tablet Take 1 tablet by mouth in the morning.    ? omeprazole (PRILOSEC) 20 MG capsule Take 20 mg by mouth every other day. In the morning.    ? Polyethyl Glycol-Propyl Glycol (SYSTANE ULTRA OP) Place 1-2 drops into both eyes in the morning and at bedtime.    ? pravastatin (PRAVACHOL) 80 MG tablet Take 80 mg by mouth at bedtime.    ? saw palmetto 160 MG capsule Take 160 mg by mouth 2 (two) times daily.    ? tadalafil (CIALIS) 5 MG tablet Take 5 mg by mouth at bedtime.    ? TESTOSTERONE TD Place 1 mL onto the skin See admin instructions. 10% testosterone cream compounded at [facility]  - apply 1 ml topically to shoulders once every morning    ? XARELTO 15 MG TABS tablet TAKE ONE (1) TABLET BY MOUTH EACH DAY WITH SUPPER (Patient taking differently: 15 mg daily after supper.) 90 tablet 2  ? ?No current facility-administered medications for this visit.  ? ? ?Allergies:   Cephalexin, Iodinated contrast media, Iohexol, Ace inhibitors, Metoprolol, Viagra [sildenafil citrate], Ciprofloxacin, Codeine, and Sulfa antibiotics  ? ?Social History: ?Social History  ? ?Socioeconomic History  ? Marital status: Married  ?  Spouse name: maxine  ? Number of children: 4  ? Years of education: college4  ? Highest education level: Not on file  ?Occupational History  ? Occupation: retired  ?Tobacco Use  ? Smoking status: Former  ?  Packs/day: 1.00  ?  Years: 35.00  ?  Pack years: 35.00  ?  Types: Cigarettes  ?  Quit date: 08/29/1977  ?  Years since quitting: 44.2  ? Smokeless tobacco: Never  ?Substance and Sexual Activity  ? Alcohol use: No  ?   Alcohol/week: 0.0 standard drinks  ? Drug use: No  ? Sexual activity: Not on file  ?Other Topics Concern  ? Not on file  ?Social History Narrative  ? Patient is married with 4 children.  ? Patient is right handed.  ? Patient has college education.  ? Patient drinks decaff coffee.  ? ?Social Determinants of Health  ? ?Financial Resource Strain: Not on file  ?Food Insecurity: Not on file  ?Transportation Needs: Not on file  ?Physical Activity: Not on file  ?Stress: Not on file  ?Social Connections: Not on file  ?Intimate Partner Violence: Not on file  ? ? ?Family History: ?Family History  ?Problem Relation Age of  Onset  ? Heart disease Mother   ?     PPM  ? Hypertension Mother   ? Lymphoma Father   ? Prostate cancer Father   ? Prostate cancer Brother   ? Aneurysm Brother   ?     REPAIR  ? CAD Brother   ?     CABG  ? Other Brother   ?     STENTS IN LEGS  ? Other Sister   ?     STOMACH ISSUES  ? Depression Sister   ? Lung cancer Brother   ?     Asbestosis  ? ? ? ?Review of Systems: ?All other systems reviewed and are otherwise negative except as noted above. ? ?Physical Exam: ?There were no vitals filed for this visit.  ? ?GEN- The patient is well appearing, alert and oriented x 3 today.   ?HEENT: normocephalic, atraumatic; sclera clear, conjunctiva pink; hearing intact; oropharynx clear; neck supple  ?Lungs- Clear to ausculation bilaterally, normal work of breathing.  No wheezes, rales, rhonchi ?Heart- Regular rate and rhythm, no murmurs, rubs or gallops  ?GI- soft, non-tender, non-distended, bowel sounds present  ?Extremities- no clubbing or cyanosis. No edema ?MS- no significant deformity or atrophy ?Skin- warm and dry, no rash or lesion; PPM pocket well healed ?Psych- euthymic mood, full affect ?Neuro- strength and sensation are intact ? ?PPM Interrogation- Device briefly interrogated to confirm AF. AF ongoing x 27 days. ? ?EKG:  EKG is not ordered today. ? ? ?Recent Labs: ?10/10/2021: ALT 17; B Natriuretic Peptide  307.6; BUN 26; Creatinine, Ser 1.78; Hemoglobin 16.7; Magnesium 2.0; Platelets 139; Potassium 4.3; Sodium 138  ? ?Wt Readings from Last 3 Encounters:  ?10/10/21 171 lb 15.3 oz (78 kg)  ?10/03/21 184 l

## 2021-11-01 NOTE — Progress Notes (Unsigned)
Electrophysiology Office Note Date: 11/01/2021  ID:  Jose [name], DOB [birth year], MRN [id number]  PCP: Derinda Late, MD Primary Cardiologist: Kirk Ruths, MD Electrophysiologist: Cristopher Peru, MD   CC: Pacemaker follow-up  Jose [name] is a 88 y.o. male seen today for Cristopher Peru, MD for routine electrophysiology followup.  Since last being seen in our clinic the patient reports doing ***.  he denies chest pain, palpitations, dyspnea, PND, orthopnea, nausea, vomiting, dizziness, syncope, edema, weight gain, or early satiety.  Device History: Medtronic Dual Chamber PPM implanted 10/11/2021 for symptomatic bradycardia  Past Medical History:  Diagnosis Date   At risk for sleep apnea    STOP-BANG= 5   SENT TO PCP 05-20-2014   Bilateral carotid artery stenosis    mild bilateral proximcal ICA  40% per duplex 11/ 2014   Coronary artery disease    a. s/p CABG in 2007 w/ LIMA-LAD, SVG-D1, SVG-1st & 2nd Mrg, SVG-PDA and posterior lateral   ED (erectile dysfunction)    Eye infection 12/2014   GERD (gastroesophageal reflux disease)    History of embolic stroke no residual   11/ 2014  --  right PCA and MCA branch infarts (left side vision difficulties)   Hypertension    Other malaise and fatigue    Paroxysmal atrial fibrillation ([place])    Pneumonia 10/2015   led to CHF, in hospital x 6 days   S/P CABG x 6    2007   Ureteral obstruction, right    Past Surgical History:  Procedure Laterality Date   APPENDECTOMY  1950's   CARDIAC CATHETERIZATION  10-31-2005  dr wall   severe three vessel/  perserved LV   CARDIOVASCULAR STRESS TEST  last one 05-19-2014  dr Stanford Breed   low risk lexiscan no exercise study/ small inferobasal infarct with no ischemia /  ef 53%   CARDIOVERSION N/A 10/03/2021   Procedure: CARDIOVERSION;  Surgeon: Werner Lean, MD;  Location: [facility];  Service: Cardiovascular;  Laterality: N/A;   CORONARY ARTERY BYPASS GRAFT  11-01-2005  dr Roxy Manns    CLOSURE OF PFO/  LIMA to LAD,  SVG to D1,  SVG to 1st & 2nd  MARGINAL branches, SVG  to PDA and posterior lateral   CYSTOSCOPY W/ URETERAL STENT PLACEMENT Right 05/21/2014   Procedure: CYSTOSCOPY WITH RETROGRADE PYELOGRAM/URETERAL STENT PLACEMENT;  Surgeon: Ailene Rud, MD;  Location: [place];  Service: Urology;  Laterality: Right;   CYSTOSCOPY WITH URETEROSCOPY Right 05/21/2014   Procedure: CYSTOSCOPY WITH URETEROSCOPY;  Surgeon: Ailene Rud, MD;  Location: [facility];  Service: Urology;  Laterality: Right;   INGUINAL HERNIA REPAIR Bilateral    LAPAROSCOPIC CHOLECYSTECTOMY  10-02-2000   PACEMAKER IMPLANT N/A 10/11/2021   Procedure: PACEMAKER IMPLANT;  Surgeon: Evans Lance, MD;  Location: [place] [facility];  Service: Cardiovascular;  Laterality: N/A;   RIGHT URETER OBSTRUCTION SURGERY  age 46   TEE WITHOUT CARDIOVERSION N/A 07/17/2013   Procedure: TRANSESOPHAGEAL ECHOCARDIOGRAM (TEE);  Surgeon: Josue Hector, MD;  Location: [facility];  Service: Cardiovascular;  Laterality: N/A;  normal LV size, mild prolapse of posterior mitral valve leaflet,  mild MR and TR, normal AV,  no LAA thrombus,  atrial septal aneurysm with no PFO/ ASD and negative bubble study, normal RV, normal aorta with no debris    Current Outpatient Medications  Medication Sig Dispense Refill   acetaminophen (TYLENOL) 650 MG CR tablet Take 650 mg by mouth every 8 (eight)  hours as needed for pain.     carvedilol (COREG) 3.125 MG tablet Take 1 tablet (3.125 mg total) by mouth 2 (two) times daily with a meal. 60 tablet 5   cholecalciferol (VITAMIN D) 25 MCG (1000 UNIT) tablet Take 1,000 Units by mouth in the morning.     COD LIVER OIL PO Take 1 capsule by mouth at bedtime.      ezetimibe (ZETIA) 10 MG tablet Take 10 mg by mouth every morning.     furosemide (LASIX) 20 MG tablet Take 2 tablets (40 mg total) by mouth daily. (Patient taking differently: Take 40 mg by mouth every  morning.) 180 tablet 3   losartan (COZAAR) 100 MG tablet Take 100 mg by mouth in the morning.     Multiple Vitamin (MULTIVITAMIN WITH MINERALS) TABS tablet Take 1 tablet by mouth in the morning.     omeprazole (PRILOSEC) 20 MG capsule Take 20 mg by mouth every other day. In the morning.     Polyethyl Glycol-Propyl Glycol (SYSTANE ULTRA OP) Place 1-2 drops into both eyes in the morning and at bedtime.     pravastatin (PRAVACHOL) 80 MG tablet Take 80 mg by mouth at bedtime.     saw palmetto 160 MG capsule Take 160 mg by mouth 2 (two) times daily.     tadalafil (CIALIS) 5 MG tablet Take 5 mg by mouth at bedtime.     TESTOSTERONE TD Place 1 mL onto the skin See admin instructions. 10% testosterone cream compounded at [place]  - apply 1 ml topically to shoulders once every morning     XARELTO 15 MG TABS tablet TAKE ONE (1) TABLET BY MOUTH EACH DAY WITH SUPPER (Patient taking differently: 15 mg daily after supper.) 90 tablet 2   No current facility-administered medications for this visit.    Allergies:   Cephalexin, Iodinated contrast media, Iohexol, Ace inhibitors, Metoprolol, Viagra [sildenafil citrate], Ciprofloxacin, Codeine, and Sulfa antibiotics   Social History: Social History   Socioeconomic History   Marital status: Married    Spouse name: maxine   Number of children: 4   Years of education: college4   Highest education level: Not on file  Occupational History   Occupation: retired  Tobacco Use   Smoking status: Former    Packs/day: 1.00    Years: 35.00    Pack years: 35.00    Types: Cigarettes    Quit date: 08/29/1977    Years since quitting: 44.2   Smokeless tobacco: Never  Substance and Sexual Activity   Alcohol use: No    Alcohol/week: 0.0 standard drinks   Drug use: No   Sexual activity: Not on file  Other Topics Concern   Not on file  Social History Narrative   Patient is married with 4 children.   Patient is right handed.   Patient has college  education.   Patient drinks decaff coffee.   Social Determinants of Health   Financial Resource Strain: Not on file  Food Insecurity: Not on file  Transportation Needs: Not on file  Physical Activity: Not on file  Stress: Not on file  Social Connections: Not on file  Intimate Partner Violence: Not on file    Family History: Family History  Problem Relation Age of Onset   Heart disease Mother        PPM   Hypertension Mother    Lymphoma Father    Prostate cancer Father    Prostate cancer Brother    Aneurysm Brother  REPAIR   CAD Brother        CABG   Other Brother        STENTS IN LEGS   Other Sister        STOMACH ISSUES   Depression Sister    Lung cancer Brother        Asbestosis     Review of Systems: All other systems reviewed and are otherwise negative except as noted above.  Physical Exam: There were no vitals filed for this visit.   GEN- The patient is well appearing, alert and oriented x 3 today.   HEENT: normocephalic, atraumatic; sclera clear, conjunctiva pink; hearing intact; oropharynx clear; neck supple  Lungs- Clear to ausculation bilaterally, normal work of breathing.  No wheezes, rales, rhonchi Heart- Regular rate and rhythm, no murmurs, rubs or gallops  GI- soft, non-tender, non-distended, bowel sounds present  Extremities- no clubbing or cyanosis. No edema MS- no significant deformity or atrophy Skin- warm and dry, no rash or lesion; PPM pocket well healed Psych- euthymic mood, full affect Neuro- strength and sensation are intact  PPM Interrogation- reviewed in detail today,  See PACEART report  EKG:  EKG {ACTION; IS/IS [id number] ordered today. Personal review of ekg ordered {Blank single:19197::"today","***"} shows ***   Recent Labs: 10/10/2021: ALT 17; B Natriuretic Peptide 307.6; BUN 26; Creatinine, Ser 1.78; Hemoglobin 16.7; Magnesium 2.0; Platelets 139; Potassium 4.3; Sodium 138   Wt Readings from Last 3 Encounters:   10/10/21 171 lb 15.3 oz (78 kg)  10/03/21 184 lb 1.4 oz (83.5 kg)  09/21/21 184 lb (83.5 kg)     Other studies Reviewed: Additional studies/ records that were reviewed today include: Previous EP office notes, Previous remote checks, Most recent labwork.   Assessment and Plan:  1. Symptomatic bradycardia s/p Medtronic PPM  Normal PPM function See Pace Art report No changes today  2. Persistent atrial fibrillation CHA2DS2/VASc at least 6. On Xarelto.  *** amiodarone   3. CAD Old CABG Denies s/s ischemia  4. HTN Stable on current regimen   5. ICM Echo 10/10/2021 LVEF 45-50% Continue coreg.     Current medicines are reviewed at length with the patient today.    Labs/ tests ordered today include: *** No orders of the defined types were placed in this encounter.    Disposition:   Follow up with {Blank single:19197::"Dr. Allred","Dr. Arlan Organ. Klein","Dr. Camnitz","Dr. Lambert","EP APP"} in {Blank single:19197::"2 weeks","4 weeks","3 months","6 months","12 months","as usual post gen change"}    Signed, Annamaria Helling  11/01/2021 9:24 AM  [facility] [address] [place]  [place] [address] [phone] (office) [phone] (fax)

## 2021-11-08 ENCOUNTER — Other Ambulatory Visit: Payer: Self-pay

## 2021-11-08 ENCOUNTER — Ambulatory Visit (INDEPENDENT_AMBULATORY_CARE_PROVIDER_SITE_OTHER): Payer: Medicare Other | Admitting: Student

## 2021-11-08 ENCOUNTER — Encounter: Payer: Self-pay | Admitting: Student

## 2021-11-08 VITALS — BP 138/70 | HR 64 | Ht 69.0 in | Wt 176.0 lb

## 2021-11-08 DIAGNOSIS — I10 Essential (primary) hypertension: Secondary | ICD-10-CM | POA: Diagnosis not present

## 2021-11-08 DIAGNOSIS — I255 Ischemic cardiomyopathy: Secondary | ICD-10-CM

## 2021-11-08 DIAGNOSIS — I480 Paroxysmal atrial fibrillation: Secondary | ICD-10-CM

## 2021-11-08 DIAGNOSIS — I2510 Atherosclerotic heart disease of native coronary artery without angina pectoris: Secondary | ICD-10-CM

## 2021-11-08 LAB — COMPREHENSIVE METABOLIC PANEL
ALT: 13 IU/L (ref 0–44)
AST: 18 IU/L (ref 0–40)
Albumin/Globulin Ratio: 1.3 (ref 1.2–2.2)
Albumin: 4 g/dL (ref 3.6–4.6)
Alkaline Phosphatase: 105 IU/L (ref 44–121)
BUN/Creatinine Ratio: 15 (ref 10–24)
BUN: 24 mg/dL (ref 8–27)
Bilirubin Total: 0.8 mg/dL (ref 0.0–1.2)
CO2: 26 mmol/L (ref 20–29)
Calcium: 9.7 mg/dL (ref 8.6–10.2)
Chloride: 101 mmol/L (ref 96–106)
Creatinine, Ser: 1.64 mg/dL — ABNORMAL HIGH (ref 0.76–1.27)
Globulin, Total: 3 g/dL (ref 1.5–4.5)
Glucose: 95 mg/dL (ref 70–99)
Potassium: 3.8 mmol/L (ref 3.5–5.2)
Sodium: 141 mmol/L (ref 134–144)
Total Protein: 7 g/dL (ref 6.0–8.5)
eGFR: 40 mL/min/{1.73_m2} — ABNORMAL LOW (ref >59)

## 2021-11-08 LAB — TSH: TSH: 1.51 u[IU]/mL (ref 0.450–4.500)

## 2021-11-08 MED ORDER — AMIODARONE HCL 200 MG PO TABS: ORAL_TABLET | ORAL | 3 refills | Status: DC

## 2021-11-08 NOTE — Patient Instructions (Signed)
Medication Instructions:  ?Your physician has recommended you make the following change in your medication:  ? ?START: Amiodarone 200mg  twice daily for 2 weeks then decrease to 200mg  once daily ? ?*If you need a refill on your cardiac medications before your next appointment, please call your pharmacy* ? ? ?Lab Work: ?TODAY: CMET, TSH ? ?If you have labs (blood work) drawn today and your tests are completely normal, you will receive your results only by: ?MyChart Message (if you have MyChart) OR ?A paper copy in the mail ?If you have any lab test that is abnormal or we need to change your treatment, we will call you to review the results. ? ?Follow-Up: ?At [facility], you and your health needs are our priority.  As part of our continuing mission to provide you with exceptional heart care, we have created designated Provider Care Teams.  These Care Teams include your primary Cardiologist (physician) and Advanced Practice Providers (APPs -  Physician Assistants and Nurse Practitioners) who all work together to provide you with the care you need, when you need it. ? ? ?Your next appointment:   ?As scheduled ? ?Other Instructions ?See letter  ?

## 2021-11-17 ENCOUNTER — Encounter (HOSPITAL_COMMUNITY): Payer: Self-pay | Admitting: Internal Medicine

## 2021-11-25 ENCOUNTER — Ambulatory Visit (HOSPITAL_COMMUNITY)
Admission: RE | Admit: 2021-11-25 | Discharge: 2021-11-25 | Disposition: A | Discharge status: Home / Self Care | Payer: Medicare Other | Attending: Internal Medicine | Admitting: Internal Medicine

## 2021-11-25 ENCOUNTER — Encounter (HOSPITAL_COMMUNITY): Payer: Self-pay | Admitting: Internal Medicine

## 2021-11-25 ENCOUNTER — Encounter (HOSPITAL_COMMUNITY)
Admission: RE | Disposition: A | Discharge status: Home / Self Care | Payer: Self-pay | Source: Home / Self Care | Attending: Internal Medicine

## 2021-11-25 ENCOUNTER — Ambulatory Visit (HOSPITAL_BASED_OUTPATIENT_CLINIC_OR_DEPARTMENT_OTHER): Payer: Medicare Other | Admitting: Anesthesiology

## 2021-11-25 ENCOUNTER — Ambulatory Visit (HOSPITAL_COMMUNITY): Payer: Medicare Other | Admitting: Anesthesiology

## 2021-11-25 ENCOUNTER — Other Ambulatory Visit: Payer: Self-pay

## 2021-11-25 DIAGNOSIS — I509 Heart failure, unspecified: Secondary | ICD-10-CM | POA: Diagnosis not present

## 2021-11-25 DIAGNOSIS — I2510 Atherosclerotic heart disease of native coronary artery without angina pectoris: Secondary | ICD-10-CM

## 2021-11-25 DIAGNOSIS — Z8673 Personal history of transient ischemic attack (TIA), and cerebral infarction without residual deficits: Secondary | ICD-10-CM | POA: Diagnosis not present

## 2021-11-25 DIAGNOSIS — I110 Hypertensive heart disease with heart failure: Secondary | ICD-10-CM

## 2021-11-25 DIAGNOSIS — I4819 Other persistent atrial fibrillation: Secondary | ICD-10-CM | POA: Diagnosis not present

## 2021-11-25 DIAGNOSIS — Z950 Presence of cardiac pacemaker: Secondary | ICD-10-CM | POA: Insufficient documentation

## 2021-11-25 DIAGNOSIS — I4891 Unspecified atrial fibrillation: Secondary | ICD-10-CM | POA: Diagnosis not present

## 2021-11-25 DIAGNOSIS — Z87891 Personal history of nicotine dependence: Secondary | ICD-10-CM | POA: Insufficient documentation

## 2021-11-25 DIAGNOSIS — Z7901 Long term (current) use of anticoagulants: Secondary | ICD-10-CM | POA: Insufficient documentation

## 2021-11-25 DIAGNOSIS — I480 Paroxysmal atrial fibrillation: Secondary | ICD-10-CM | POA: Insufficient documentation

## 2021-11-25 DIAGNOSIS — K219 Gastro-esophageal reflux disease without esophagitis: Secondary | ICD-10-CM | POA: Insufficient documentation

## 2021-11-25 DIAGNOSIS — Z951 Presence of aortocoronary bypass graft: Secondary | ICD-10-CM | POA: Diagnosis not present

## 2021-11-25 HISTORY — PX: CARDIOVERSION: SHX1299

## 2021-11-25 LAB — POCT I-STAT, CHEM 8
BUN: 32 mg/dL — ABNORMAL HIGH (ref 8–23)
Calcium, Ion: 1.21 mmol/L (ref 1.15–1.40)
Chloride: 100 mmol/L (ref 98–111)
Creatinine, Ser: 2 mg/dL — ABNORMAL HIGH (ref 0.61–1.24)
Glucose, Bld: 99 mg/dL (ref 70–99)
HCT: 49 % (ref 39.0–52.0)
Hemoglobin: 16.7 g/dL (ref 13.0–17.0)
Potassium: 4.4 mmol/L (ref 3.5–5.1)
Sodium: 139 mmol/L (ref 135–145)
TCO2: 30 mmol/L (ref 22–32)

## 2021-11-25 SURGERY — CARDIOVERSION
Anesthesia: General

## 2021-11-25 MED ORDER — LIDOCAINE 2% (20 MG/ML) 5 ML SYRINGE
INTRAMUSCULAR | Status: DC | PRN
  Administered 2021-11-25: 50 mg via INTRAVENOUS

## 2021-11-25 MED ORDER — PROPOFOL 10 MG/ML IV BOLUS
INTRAVENOUS | Status: DC | PRN
  Administered 2021-11-25: 50 mg via INTRAVENOUS

## 2021-11-25 MED ORDER — SODIUM CHLORIDE 0.9 % IV SOLN: INTRAVENOUS | Status: DC

## 2021-11-25 NOTE — Discharge Instructions (Signed)

## 2021-11-25 NOTE — Anesthesia Preprocedure Evaluation (Signed)
Anesthesia Evaluation  ?Patient identified by MRN, date of birth, ID band ?Patient awake ? ? ? ?Reviewed: ?Allergy & Precautions, NPO status , Patient's Chart, lab work & pertinent test results, reviewed documented beta blocker date and time  ? ?Airway ?Mallampati: II ? ?TM Distance: >3 FB ?Neck ROM: Full ? ? ? Dental ? ?(+) Edentulous Upper, Edentulous Lower ?  ?Pulmonary ?pneumonia, resolved, former smoker,  ?Hx/o brochiolitis obliterans ?  ?breath sounds clear to auscultation ?+ decreased breath sounds ? ? ? ? ? Cardiovascular ?hypertension, Pt. on medications and Pt. on home beta blockers ?+ CAD, + CABG and +CHF  ?+ dysrhythmias Atrial Fibrillation  ?Rhythm:Irregular Rate:Normal ? ?CABG x 6 2007 ?  ?Neuro/Psych ?Hx/o embolic stroke- visual field defect resolved ?No Residual Symptoms negative neurological ROS ? negative psych ROS  ? GI/Hepatic ?Neg liver ROS, GERD  Medicated and Controlled,  ?Endo/Other  ?Hyperlipdiemia ? Renal/GU ?Renal InsufficiencyRenal diseaseRight hydronephrosis  ?negative genitourinary ?  ?Musculoskeletal ?negative musculoskeletal ROS ?(+)  ? Abdominal ?  ?Peds ? Hematology ?Xarelto therapy- last dose 11/24/21 pm   ?Anesthesia Other Findings ? ? Reproductive/Obstetrics ?ED ? ?  ? ? ? ? ? ? ? ? ? ? ? ? ? ?  ?  ? ? ? ? ? ? ? ? ?Anesthesia Physical ? ?Anesthesia Plan ? ?ASA: 3 ? ?Anesthesia Plan: General  ? ?Post-op Pain Management:   ? ?Induction: Intravenous ? ?PONV Risk Score and Plan: 2 and Treatment may vary due to age or medical condition ? ?Airway Management Planned: Natural Airway and Mask ? ?Additional Equipment:  ? ?Intra-op Plan:  ? ?Post-operative Plan:  ? ?Informed Consent: I have reviewed the patients History and Physical, chart, labs and discussed the procedure including the risks, benefits and alternatives for the proposed anesthesia with the patient or authorized representative who has indicated his/her understanding and acceptance.   ? ? ? ?Dental advisory given ? ?Plan Discussed with: CRNA and Anesthesiologist ? ?Anesthesia Plan Comments:   ? ? ? ? ? ? ?Anesthesia Quick Evaluation ? ?

## 2021-11-25 NOTE — CV Procedure (Signed)
Procedure: Electrical Cardioversion ?Indications:  Atrial Fibrillation ? ?Procedure Details: ? ?Consent: Risks of procedure as well as the alternatives and risks of each were explained to the (patient/caregiver).  Consent for procedure obtained. ? ?Time Out: Verified patient identification, verified procedure, site/side was marked, verified correct patient position, special equipment/implants available, medications/allergies/relevent history reviewed, required imaging and test results available. PERFORMED. ? ?Patient placed on cardiac monitor, pulse oximetry, supplemental oxygen as necessary.  ?Sedation given:  50 mg propofol, 50 mg lidocaine ?Pacer pads placed anterior and posterior chest. ? ?Cardioverted 1 time(s).  ?Cardioversion with synchronized biphasic 200J shock. ? ?Evaluation: ?Findings: Post procedure EKG shows:  Atrial paced ?Complications: None ?Patient did tolerate procedure well. ? ?Time Spent Directly with the Patient: ? ?20 minutes  ? ?[name] ?11/25/2021, 10:28 AM ? ?

## 2021-11-25 NOTE — Interval H&P Note (Signed)
History and Physical Interval Note: ? ?11/25/2021 ?10:17 AM ? ?Jose [name]  has presented today for surgery, with the diagnosis of AFIB.  The various methods of treatment have been discussed with the patient and family. After consideration of risks, benefits and other options for treatment, the patient has consented to  Procedure(s): ?CARDIOVERSION (N/A) as a surgical intervention.  The patient's history has been reviewed, patient examined, no change in status, stable for surgery.  I have reviewed the patient's chart and labs.  Questions were answered to the patient's satisfaction.   ? ? ?[name] ? ? ?

## 2021-11-25 NOTE — Transfer of Care (Signed)
Immediate Anesthesia Transfer of Care Note ? ?Patient: Jose [name] ? ?Procedure(s) Performed: CARDIOVERSION ? ?Patient Location: Endoscopy Unit ? ?Anesthesia Type:General ? ?Level of Consciousness: awake, drowsy and patient cooperative ? ?Airway & Oxygen Therapy: Patient Spontanous Breathing ? ?Post-op Assessment: Report given to RN, Post -op Vital signs reviewed and stable and Patient moving all extremities X 4 ? ?Post vital signs: Reviewed and stable ? ?Last Vitals:  ?Vitals Value Taken Time  ?BP    ?Temp    ?Pulse    ?Resp    ?SpO2    ? ? ?Last Pain:  ?Vitals:  ? 11/25/21 0955  ?TempSrc: Temporal  ?PainSc: 0-No pain  ?   ? ?  ? ?Complications: No notable events documented. ?

## 2021-11-25 NOTE — Anesthesia Postprocedure Evaluation (Signed)
Anesthesia Post Note ? ?Patient: Jose [name] ? ?Procedure(s) Performed: CARDIOVERSION ? ?  ? ?Patient location during evaluation: PACU ?Anesthesia Type: General ?Level of consciousness: awake and alert and oriented ?Pain management: pain level controlled ?Vital Signs Assessment: post-procedure vital signs reviewed and stable ?Respiratory status: spontaneous breathing, nonlabored ventilation and respiratory function stable ?Cardiovascular status: blood pressure returned to baseline and stable ?Postop Assessment: no apparent nausea or vomiting ?Anesthetic complications: no ? ? ?No notable events documented. ? ?Last Vitals:  ?Vitals:  ? 11/25/21 1131 11/25/21 1140  ?BP: 130/69 129/83  ?Pulse: (!) 59 (!) 59  ?Resp: 12 14  ?Temp: 36.8 ?C   ?SpO2: 97% 96%  ?  ?Last Pain:  ?Vitals:  ? 11/25/21 1140  ?TempSrc:   ?PainSc: 0-No pain  ? ? ?  ?  ?  ?  ?  ?  ? ?Manuella Blackson A. ? ? ? ? ?

## 2021-11-27 ENCOUNTER — Encounter (HOSPITAL_COMMUNITY): Payer: Self-pay | Admitting: Internal Medicine

## 2021-11-30 NOTE — Progress Notes (Signed)
? ? ? ? ?HPI: FU CAD and atrial fibrillation. Patient underwent coronary artery bypass and graft in 2007. He had a LIMA to the LAD, saphenous vein graft to first diagonal, sequential saphenous vein graft to the first marginal and second marginal, sequential saphenous vein graft to the PDA and posterior lateral. He also had closure of PFO. Abdominal ultrasound May 2014 showed no aneurysm. Had CVA in November of 2014. Transesophageal echocardiogram November 2014 showed normal LV function, mild prolapse of the posterior mitral valve leaflet and an atrial septal aneurysm with negative bubble study. Carotid Dopplers November 2014 showed less than 40% bilateral stenosis. 30 day event monitor showed atrial fibrillation. Nuclear study May 2017 showed ejection fraction 53%.  There was diaphragmatic attenuation but no ischemia.  Echocardiogram February 2023 showed ejection fraction 45 to 50%, mild left atrial enlargement.  Patient had cardioversion October 03, 2021.  Subsequently noted to have symptomatic bradycardia.  Had pacemaker placed February 2023.  He was subsequently placed on amiodarone and underwent repeat cardioversion.  Since last seen, patient denies dyspnea, chest pain, palpitations or syncope. ? ?Current Outpatient Medications  ?Medication Sig Dispense Refill  ? acetaminophen (TYLENOL) 650 MG CR tablet Take 1,300 mg by mouth every 8 (eight) hours as needed for pain.    ? amiodarone (PACERONE) 200 MG tablet Take 200mg  twice daily for 2 weeks then decrease to 200mg  daily. (Patient taking differently: Take 200 mg by mouth daily.) 90 tablet 3  ? carvedilol (COREG) 3.125 MG tablet Take 1 tablet (3.125 mg total) by mouth 2 (two) times daily with a meal. 60 tablet 5  ? cholecalciferol (VITAMIN D) 25 MCG (1000 UNIT) tablet Take 1,000 Units by mouth in the morning.    ? COD LIVER OIL PO Take 1 capsule by mouth at bedtime.     ? ezetimibe (ZETIA) 10 MG tablet Take 10 mg by mouth every morning.    ? furosemide (LASIX) 20  MG tablet Take 2 tablets (40 mg total) by mouth daily. 180 tablet 3  ? loratadine (CLARITIN) 10 MG tablet Take 10 mg by mouth daily as needed for allergies.    ? losartan (COZAAR) 100 MG tablet Take 100 mg by mouth in the morning.    ? Multiple Vitamin (MULTIVITAMIN WITH MINERALS) TABS tablet Take 1 tablet by mouth in the morning.    ? omeprazole (PRILOSEC) 20 MG capsule Take 20 mg by mouth every other day. In the morning.    ? Polyethyl Glycol-Propyl Glycol (SYSTANE ULTRA OP) Place 1-2 drops into both eyes in the morning and at bedtime.    ? pravastatin (PRAVACHOL) 80 MG tablet Take 80 mg by mouth at bedtime.    ? saw palmetto 160 MG capsule Take 160 mg by mouth 2 (two) times daily.    ? tadalafil (CIALIS) 5 MG tablet Take 5 mg by mouth at bedtime.    ? TESTOSTERONE TD Place 1 mL onto the skin See admin instructions. 10% testosterone cream compounded at [place]  - apply 1 ml topically to shoulders once every morning    ? XARELTO 15 MG TABS tablet TAKE ONE (1) TABLET BY MOUTH EACH DAY WITH SUPPER 90 tablet 2  ? ?No current facility-administered medications for this visit.  ? ? ? ?Past Medical History:  ?Diagnosis Date  ? At risk for sleep apnea   ? STOP-BANG= 5   SENT TO PCP 05-20-2014  ? Bilateral carotid artery stenosis   ? mild bilateral proximcal ICA  40% per  duplex 11/ 2014  ? Coronary artery disease   ? a. s/p CABG in 2007 w/ LIMA-LAD, SVG-D1, SVG-1st & 2nd Mrg, SVG-PDA and posterior lateral  ? ED (erectile dysfunction)   ? Eye infection 12/27/2014  ? GERD (gastroesophageal reflux disease)   ? History of embolic stroke no residual  ? 11/ 2014  --  right PCA and MCA branch infarts (left side vision difficulties)  ? Hypertension   ? Other malaise and fatigue   ? Paroxysmal atrial fibrillation ([facility])   ? Pneumonia 10/27/2015  ? led to CHF, in hospital x 6 days  ? S/P CABG x 6   ? 2007  ? Ureteral obstruction, right   ? ? ?Past Surgical History:  ?Procedure Laterality Date  ? APPENDECTOMY  1950's  ?  CARDIAC CATHETERIZATION  10-31-2005  dr wall  ? severe three vessel/  perserved LV  ? CARDIOVASCULAR STRESS TEST  last one 05-19-2014  dr Stanford Breed  ? low risk lexiscan no exercise study/ small inferobasal infarct with no ischemia /  ef 53%  ? CARDIOVERSION N/A 10/03/2021  ? Procedure: CARDIOVERSION;  Surgeon: Werner Lean, MD;  Location: [place];  Service: Cardiovascular;  Laterality: N/A;  ? CARDIOVERSION N/A 11/25/2021  ? Procedure: CARDIOVERSION;  Surgeon: Janina Mayo, MD;  Location: [place];  Service: Cardiovascular;  Laterality: N/A;  ? CORONARY ARTERY BYPASS GRAFT  11-01-2005  dr Roxy Manns  ? CLOSURE OF PFO/  LIMA to LAD,  SVG to D1,  SVG to 1st & 2nd  MARGINAL branches, SVG  to PDA and posterior lateral  ? CYSTOSCOPY W/ URETERAL STENT PLACEMENT Right 05/21/2014  ? Procedure: CYSTOSCOPY WITH RETROGRADE PYELOGRAM/URETERAL STENT PLACEMENT;  Surgeon: Ailene Rud, MD;  [facility]: [facility];  Service: Urology;  Laterality: Right;  ? CYSTOSCOPY WITH URETEROSCOPY Right 05/21/2014  ? Procedure: CYSTOSCOPY WITH URETEROSCOPY;  Surgeon: Ailene Rud, MD;  Location[facility];  Service: Urology;  Laterality: Right;  ? INGUINAL HERNIA REPAIR Bilateral   ? LAPAROSCOPIC CHOLECYSTECTOMY  10-02-2000  ? PACEMAKER IMPLANT N/A 10/11/2021  ? Procedure: PACEMAKER IMPLANT;  Surgeon: Evans Lance, MD;  Location: [place] CV LAB;  Service: Cardiovascular;  Laterality: N/A;  ? RIGHT URETER OBSTRUCTION SURGERY  age 64  ? TEE WITHOUT CARDIOVERSION N/A 07/17/2013  ? Procedure: TRANSESOPHAGEAL ECHOCARDIOGRAM (TEE);  Surgeon: Josue Hector, MD;  Location: [facility] ENDOSCOPY;  Service: Cardiovascular;  Laterality: N/A;  normal LV size, mild prolapse of posterior mitral valve leaflet,  mild MR and TR, normal AV,  no LAA thrombus,  atrial septal aneurysm with no PFO/ ASD and negative bubble study, normal RV, normal aorta with no debris  ? ? ?Social History  ? ?Socioeconomic  History  ? Marital status: Married  ?  Spouse name: maxine  ? Number of children: 4  ? Years of education: college4  ? Highest education level: Not on file  ?Occupational History  ? Occupation: retired  ?Tobacco Use  ? Smoking status: Former  ?  Packs/day: 1.00  ?  Years: 35.00  ?  Pack years: 35.00  ?  Types: Cigarettes  ?  Quit date: 08/29/1977  ?  Years since quitting: 44.3  ? Smokeless tobacco: Never  ?Substance and Sexual Activity  ? Alcohol use: No  ?  Alcohol/week: 0.0 standard drinks  ? Drug use: No  ? Sexual activity: Not on file  ?Other Topics Concern  ? Not on file  ?Social History Narrative  ? Patient is  married with 4 children.  ? Patient is right handed.  ? Patient has college education.  ? Patient drinks decaff coffee.  ? ?Social Determinants of Health  ? ?Financial Resource Strain: Not on file  ?Food Insecurity: Not on file  ?Transportation Needs: Not on file  ?Physical Activity: Not on file  ?Stress: Not on file  ?Social Connections: Not on file  ?Intimate Partner Violence: Not on file  ? ? ?Family History  ?Problem Relation Age of Onset  ? Heart disease Mother   ?     PPM  ? Hypertension Mother   ? Lymphoma Father   ? Prostate cancer Father   ? Prostate cancer Brother   ? Aneurysm Brother   ?     REPAIR  ? CAD Brother   ?     CABG  ? Other Brother   ?     STENTS IN LEGS  ? Other Sister   ?     STOMACH ISSUES  ? Depression Sister   ? Lung cancer Brother   ?     Asbestosis  ? ? ?ROS: no fevers or chills, productive cough, hemoptysis, dysphasia, odynophagia, melena, hematochezia, dysuria, hematuria, rash, seizure activity, orthopnea, PND, pedal edema, claudication. Remaining systems are negative. ? ?Physical Exam: ?Well-developed well-nourished in no acute distress.  ?Skin is warm and dry.  ?HEENT is normal.  ?Neck is supple.  ?Chest is clear to auscultation with normal expansion.  ?Cardiovascular exam is regular rate and rhythm.  ?Abdominal exam nontender or distended. No masses palpated. ?Extremities  show no edema. ?neuro grossly intact ? ?ECG-ventricular paced rhythm with probable underlying atrial fibrillation.  Personally reviewed ? ?A/P ? ?1 paroxysmal atrial fibrillation-patient appears to be in [place]

## 2021-12-14 ENCOUNTER — Ambulatory Visit (INDEPENDENT_AMBULATORY_CARE_PROVIDER_SITE_OTHER): Payer: Medicare Other | Admitting: Cardiology

## 2021-12-14 ENCOUNTER — Encounter: Payer: Self-pay | Admitting: *Deleted

## 2021-12-14 ENCOUNTER — Other Ambulatory Visit: Payer: Self-pay | Admitting: *Deleted

## 2021-12-14 ENCOUNTER — Encounter: Payer: Self-pay | Admitting: Family

## 2021-12-14 ENCOUNTER — Telehealth: Payer: Self-pay

## 2021-12-14 ENCOUNTER — Encounter: Payer: Self-pay | Admitting: Cardiology

## 2021-12-14 ENCOUNTER — Ambulatory Visit (INDEPENDENT_AMBULATORY_CARE_PROVIDER_SITE_OTHER): Payer: Medicare Other | Admitting: Family

## 2021-12-14 VITALS — BP 126/76 | HR 71 | Ht 69.0 in | Wt 180.0 lb

## 2021-12-14 DIAGNOSIS — I480 Paroxysmal atrial fibrillation: Secondary | ICD-10-CM

## 2021-12-14 DIAGNOSIS — E7800 Pure hypercholesterolemia, unspecified: Secondary | ICD-10-CM

## 2021-12-14 DIAGNOSIS — I2510 Atherosclerotic heart disease of native coronary artery without angina pectoris: Secondary | ICD-10-CM

## 2021-12-14 DIAGNOSIS — I255 Ischemic cardiomyopathy: Secondary | ICD-10-CM | POA: Diagnosis not present

## 2021-12-14 DIAGNOSIS — B351 Tinea unguium: Secondary | ICD-10-CM | POA: Diagnosis not present

## 2021-12-14 DIAGNOSIS — I10 Essential (primary) hypertension: Secondary | ICD-10-CM | POA: Diagnosis not present

## 2021-12-14 NOTE — Patient Instructions (Signed)
  Follow-Up: At [facility], you and your health needs are our priority.  As part of our continuing mission to provide you with exceptional heart care, we have created designated Provider Care Teams.  These Care Teams include your primary Cardiologist (physician) and Advanced Practice Providers (APPs -  Physician Assistants and Nurse Practitioners) who all work together to provide you with the care you need, when you need it.  We recommend signing up for the patient portal called "MyChart".  Sign up information is provided on this After Visit Summary.  MyChart is used to connect with patients for Virtual Visits (Telemedicine).  Patients are able to view lab/test results, encounter notes, upcoming appointments, etc.  Non-urgent messages can be sent to your provider as well.   To learn more about what you can do with MyChart, go to [email or web address].    Your next appointment:   3 month(s)  The format for your next appointment:   In Person  Provider:   Brian Crenshaw, MD     Important Information About Sugar       

## 2021-12-14 NOTE — Telephone Encounter (Signed)
Jose [name] with Dr. Stanford Breed told the patient to send a transmission for [place] to review. She would like to know if the patient is in A-fib. If he is she is going to make him an appointment for a cardioversion. Her phone number is [phone]. She will be at that number until 1 pm. Transmission received. 12-14-2021. ?

## 2021-12-14 NOTE — Progress Notes (Signed)
? ? ?Office Visit Note ?  ?Patient: Jose [name]           ?Date of Birth: February 04, 1931           ?MRN: [id number] ?Visit Date: 12/14/2021 ?             ?Requested by: Derinda Late, MD ?[address] ?[place],  [place] [address] ?PCP: Derinda Late, MD ? ?Chief Complaint  ?Patient presents with  ? Left Foot - Follow-up  ? Right Foot - Follow-up  ? ? ? ? ? ?HPI: ?The patient is an 88 year old gentleman who presents today for bilateral foot evaluation.  He is unable to safely trim his own nails  ? ?He has no new concerns  ? ? ?Assessment & Plan: ?Visit Diagnoses:  ?No diagnosis found. ? ? ?Plan: Onychomycotic nails were trimmed x10.  Patient tolerated well.  We will follow-up in 3 months ? ?Follow-Up Instructions: Return in about 3 months (around 03/15/2022).  ? ?Ortho Exam ? ?Patient is alert, oriented, no adenopathy, well-dressed, normal affect, normal respiratory effort.  On examination bilateral feet there is no swelling no erythema no warmth.  He does have thickened and discolored onychomycotic nails x10.  These were trimmed today without incident. ? ? ?Imaging: ?No results found. ?No images are attached to the encounter. ? ?Labs: ?Lab Results  ?Component Value Date  ? HGBA1C 6.2 (H) 07/10/2013  ? ESRSEDRATE 12 01/13/2016  ? ESRSEDRATE 20 12/21/2015  ? ESRSEDRATE 94 (H) 11/26/2015  ? REPTSTATUS 11/24/2015 FINAL 11/24/2015  ? REPTSTATUS 11/28/2015 FINAL 11/24/2015  ? GRAMSTAIN  11/24/2015  ?  ABUNDANT WBC PRESENT,BOTH PMN AND MONONUCLEAR ?FEW SQUAMOUS EPITHELIAL CELLS PRESENT ?MODERATE GRAM POSITIVE COCCI ?IN PAIRS MODERATE GRAM NEGATIVE RODS ?Performed at [facility] ?  ? CULT  11/24/2015  ?  NORMAL OROPHARYNGEAL FLORA ?Performed at [facility] ?  ? ? ? ?Lab Results  ?Component Value Date  ? ALBUMIN 4.0 11/08/2021  ? ALBUMIN 3.8 10/10/2021  ? ALBUMIN 4.3 09/28/2021  ? ? ?Lab Results  ?Component Value Date  ? MG 2.0 10/10/2021  ? MG 1.6 (L) 11/22/2015  ? ?No results found for: VD25OH ? ?No  results found for: PREALBUMIN ? ?  Latest Ref Rng & Units 11/25/2021  ? 10:08 AM 10/10/2021  ? 10:20 AM 09/28/2021  ?  8:57 AM  ?CBC EXTENDED  ?WBC 4.0 - 10.5 K/uL  5.6   5.5    ?RBC 4.22 - 5.81 MIL/uL  5.50   5.52    ?Hemoglobin 13.0 - 17.0 g/dL 16.7   16.7   16.5    ?HCT 39.0 - 52.0 % 49.0   50.3   49.9    ?Platelets 150 - 400 K/uL  139   125    ?NEUT# 1.7 - 7.7 K/uL  3.7     ?Lymph# 0.7 - 4.0 K/uL  1.2     ? ? ? ?There is no height or weight on file to calculate BMI. ? ?Orders:  ?No orders of the defined types were placed in this encounter. ? ?No orders of the defined types were placed in this encounter. ? ? ? Procedures: ?No procedures performed ? ?Clinical Data: ?No additional findings. ? ?ROS: ? ?All other systems negative, except as noted in the HPI. ?Review of Systems  ?Constitutional:  Negative for chills and fever.  ?Cardiovascular:  Negative for leg swelling.  ?Skin:  Negative for wound.  ? ?Objective: ?Vital Signs: There were no vitals taken for  this visit. ? ?Specialty Comments:  ?No specialty comments available. ? ?PMFS History: ?Patient Active Problem List  ? Diagnosis Date Noted  ? Symptomatic bradycardia 10/10/2021  ? History of stroke 01/05/2016  ? Chronic respiratory failure (HCC) 12/16/2015  ? HSV-1 (herpes simplex virus 1) infection 12/16/2015  ? Interstitial lung disease (HCC) 11/27/2015  ? BOOP (bronchiolitis obliterans with organizing pneumonia) (HCC) 11/27/2015  ? Elevated troponin 11/27/2015  ? Hydronephrosis, right 11/27/2015  ? SOB (shortness of breath)   ? Acute respiratory failure with hypoxia (HCC) 11/26/2015  ? Chronic kidney disease (CKD), stage III (moderate) (HCC) 11/26/2015  ? Acute on chronic diastolic (congestive) heart failure (HCC)   ? Lobar pneumonia due to unspecified organism   ? Acute on chronic combined systolic (congestive) and diastolic (congestive) heart failure (HCC) 11/22/2015  ? Atrial fibrillation (HCC) 09/10/2013  ? Occlusion and stenosis of vertebral artery with  cerebral infarction (HCC) 07/09/2013  ? Visual field loss 07/09/2013  ? HYPERLIPIDEMIA-MIXED 12/23/2009  ? CAD, NATIVE VESSEL 12/15/2009  ? ERECTILE DYSFUNCTION 12/10/2008  ? Essential hypertension 12/10/2008  ? FATIGUE 12/10/2008  ? ?Past Medical History:  ?Diagnosis Date  ? At risk for sleep apnea   ? STOP-BANG= 5   SENT TO PCP 05-20-2014  ? Bilateral carotid artery stenosis   ? mild bilateral proximcal ICA  40% per duplex 11/ 2014  ? Coronary artery disease   ? a. s/p CABG in 2007 w/ LIMA-LAD, SVG-D1, SVG-1st & 2nd Mrg, SVG-PDA and posterior lateral  ? ED (erectile dysfunction)   ? Eye infection 12/27/2014  ? GERD (gastroesophageal reflux disease)   ? History of embolic stroke no residual  ? 11/ 2014  --  right PCA and MCA branch infarts (left side vision difficulties)  ? Hypertension   ? Other malaise and fatigue   ? Paroxysmal atrial fibrillation (HCC)   ? Pneumonia 10/27/2015  ? led to CHF, in hospital x 6 days  ? S/P CABG x 6   ? 2007  ? Ureteral obstruction, right   ?  ?Family History  ?Problem Relation Age of Onset  ? Heart disease Mother   ?     PPM  ? Hypertension Mother   ? Lymphoma Father   ? Prostate cancer Father   ? Prostate cancer Brother   ? Aneurysm Brother   ?     REPAIR  ? CAD Brother   ?     CABG  ? Other Brother   ?     STENTS IN LEGS  ? Other Sister   ?     STOMACH ISSUES  ? Depression Sister   ? Lung cancer Brother   ?     Asbestosis  ?  ?Past Surgical History:  ?Procedure Laterality Date  ? APPENDECTOMY  1950's  ? CARDIAC CATHETERIZATION  10-31-2005  dr wall  ? severe three vessel/  perserved LV  ? CARDIOVASCULAR STRESS TEST  last one 05-19-2014  dr Jens Somcrenshaw  ? low risk lexiscan no exercise study/ small inferobasal infarct with no ischemia /  ef 53%  ? CARDIOVERSION N/A 10/03/2021  ? Procedure: CARDIOVERSION;  Surgeon: Christell Constanthandrasekhar, Mahesh A, MD;  Location: [facility];  Service: Cardiovascular;  Laterality: N/A;  ? CARDIOVERSION N/A 11/25/2021  ? Procedure: CARDIOVERSION;  Surgeon: Maisie FusBranch,  Mary E, MD;  [facility];  Service: Cardiovascular;  Laterality: N/A;  ? CORONARY ARTERY BYPASS GRAFT  11-01-2005  dr Cornelius Morasowen  ? CLOSURE OF PFO/  LIMA to LAD,  SVG to D1,  SVG to 1st & 2nd  MARGINAL branches, SVG  to PDA and posterior lateral  ? CYSTOSCOPY W/ URETERAL STENT PLACEMENT Right 05/21/2014  ? Procedure: CYSTOSCOPY WITH RETROGRADE PYELOGRAM/URETERAL STENT PLACEMENT;  Surgeon: Ailene Rud, MD;  Location: [facility];  Service: Urology;  Laterality: Right;  ? CYSTOSCOPY WITH URETEROSCOPY Right 05/21/2014  ? Procedure: CYSTOSCOPY WITH URETEROSCOPY;  Surgeon: Ailene Rud, MD;  Location: [facility];  Service: Urology;  Laterality: Right;  ? INGUINAL HERNIA REPAIR Bilateral   ? LAPAROSCOPIC CHOLECYSTECTOMY  10-02-2000  ? PACEMAKER IMPLANT N/A 10/11/2021  ? Procedure: PACEMAKER IMPLANT;  Surgeon: Evans Lance, MD;  Location: [place] [facility];  Service: Cardiovascular;  Laterality: N/A;  ? RIGHT URETER OBSTRUCTION SURGERY  age 69  ? TEE WITHOUT CARDIOVERSION N/A 07/17/2013  ? Procedure: TRANSESOPHAGEAL ECHOCARDIOGRAM (TEE);  Surgeon: Josue Hector, MD;  Location: [facility];  Service: Cardiovascular;  Laterality: N/A;  normal LV size, mild prolapse of posterior mitral valve leaflet,  mild MR and TR, normal AV,  no LAA thrombus,  atrial septal aneurysm with no PFO/ ASD and negative bubble study, normal RV, normal aorta with no debris  ? ?Social History  ? ?Occupational History  ? Occupation: retired  ?Tobacco Use  ? Smoking status: Former  ?  Packs/day: 1.00  ?  Years: 35.00  ?  Pack years: 35.00  ?  Types: Cigarettes  ?  Quit date: 08/29/1977  ?  Years since quitting: 44.3  ? Smokeless tobacco: Never  ?Substance and Sexual Activity  ? Alcohol use: No  ?  Alcohol/week: 0.0 standard drinks  ? Drug use: No  ? Sexual activity: Not on file  ? ? ? ? ? ? ?

## 2021-12-14 NOTE — Telephone Encounter (Signed)
Transmission reviewed. Presenting rhythm AF/VP. Routing to Dr Jens Som and calling Debrah at [phone] ? ? ? ? ?

## 2021-12-16 ENCOUNTER — Ambulatory Visit: Payer: PRIVATE HEALTH INSURANCE | Admitting: Family

## 2021-12-16 ENCOUNTER — Telehealth: Payer: Self-pay | Admitting: Cardiology

## 2021-12-16 NOTE — Telephone Encounter (Signed)
Patient is asking that dr give him a call back. He has questions he would like to ask him. Please advis ?

## 2021-12-16 NOTE — Telephone Encounter (Signed)
Patient would like for Dr. Jens Som to call him to discuss the Watchman device. ?

## 2021-12-19 ENCOUNTER — Telehealth (INDEPENDENT_AMBULATORY_CARE_PROVIDER_SITE_OTHER): Payer: Medicare Other | Admitting: Cardiology

## 2021-12-19 ENCOUNTER — Encounter: Payer: Self-pay | Admitting: Cardiology

## 2021-12-19 VITALS — BP 157/97 | HR 86 | Ht 69.0 in | Wt 170.0 lb

## 2021-12-19 DIAGNOSIS — I4819 Other persistent atrial fibrillation: Secondary | ICD-10-CM

## 2021-12-19 DIAGNOSIS — I2510 Atherosclerotic heart disease of native coronary artery without angina pectoris: Secondary | ICD-10-CM | POA: Diagnosis not present

## 2021-12-19 DIAGNOSIS — I10 Essential (primary) hypertension: Secondary | ICD-10-CM

## 2021-12-19 DIAGNOSIS — I5032 Chronic diastolic (congestive) heart failure: Secondary | ICD-10-CM

## 2021-12-19 NOTE — Patient Instructions (Signed)
?  Follow-Up: ?At [facility], you and your health needs are our priority.  As part of our continuing mission to provide you with exceptional heart care, we have created designated Provider Care Teams.  These Care Teams include your primary Cardiologist (physician) and Advanced Practice Providers (APPs -  Physician Assistants and Nurse Practitioners) who all work together to provide you with the care you need, when you need it. ? ?We recommend signing up for the patient portal called "MyChart".  Sign up information is provided on this After Visit Summary.  MyChart is used to connect with patients for Virtual Visits (Telemedicine).  Patients are able to view lab/test results, encounter notes, upcoming appointments, etc.  Non-urgent messages can be sent to your provider as well.   ?To learn more about what you can do with MyChart, go to [email or web address].   ? ?Your next appointment:   ? ?As previously scheduled ? ?Important Information About Sugar ? ? ? ? ?  ?

## 2021-12-19 NOTE — H&P (View-Only) (Signed)
? ?Virtual Visit via Video Note  ? ?This visit type was conducted due to national recommendations for restrictions regarding the COVID-19 Pandemic (e.g. social distancing) in an effort to limit this patient's exposure and mitigate transmission in our community.  Due to his co-morbid illnesses, this patient is at least at moderate risk for complications without adequate follow up.  This format is felt to be most appropriate for this patient at this time.  All issues noted in this document were discussed and addressed.  A limited physical exam was performed with this format.  Please refer to the patient's chart for his consent to telehealth for [facility]. ? ?   ? ?Date:  12/19/2021  ? ?ID:  Jose [name], DOB June 04, 1931, MRN [id number] ? ?Patient Location:Home ?Provider Location: Home ? ?PCP:  Derinda Late, MD  ?Cardiologist:  Dr Stanford Breed ? ?Evaluation Performed:  Follow-Up Visit ? ?Chief Complaint:  FU CAD and atrial fibrillation ? ?History of Present Illness:   ? ?FU CAD and atrial fibrillation. Patient underwent coronary artery bypass and graft in 2007. He had a LIMA to the LAD, SVG to D1, sequential SVG to OM1 and OM2, sequential SVG to the PDA and posterior lateral. He also had closure of PFO. Abdominal ultrasound May 2014 showed no aneurysm. Had CVA in November of 2014. Transesophageal echocardiogram November 2014 showed normal LV function, mild prolapse of the posterior mitral valve leaflet and an atrial septal aneurysm with negative bubble study. Carotid Dopplers November 2014 showed less than 40% bilateral stenosis. 30 day event monitor showed atrial fibrillation. Nuclear study May 2017 showed ejection fraction 53%.  There was diaphragmatic attenuation but no ischemia. Echocardiogram February 2023 showed ejection fraction 45 to 50%, mild left atrial enlargement.  Patient had cardioversion October 03, 2021.  Subsequently noted to have symptomatic bradycardia.  Had pacemaker placed February 2023.  He  was subsequently placed on amiodarone and underwent repeat cardioversion.  Patient contacted the office in morning to discuss Watchman device.  Since last seen, there is no dyspnea, chest pain, palpitations or syncope.  He does not fall and there is no bleeding. ? ?The patient does not have symptoms concerning for COVID-19 infection (fever, chills, cough, or new shortness of breath).  ? ? ?Past Medical History:  ?Diagnosis Date  ? At risk for sleep apnea   ? STOP-BANG= 5   SENT TO PCP 05-20-2014  ? Bilateral carotid artery stenosis   ? mild bilateral proximcal ICA  40% per duplex 11/ 2014  ? Coronary artery disease   ? a. s/p CABG in 2007 w/ LIMA-LAD, SVG-D1, SVG-1st & 2nd Mrg, SVG-PDA and posterior lateral  ? ED (erectile dysfunction)   ? Eye infection 12/27/2014  ? GERD (gastroesophageal reflux disease)   ? History of embolic stroke no residual  ? 11/ 2014  --  right PCA and MCA branch infarts (left side vision difficulties)  ? Hypertension   ? Other malaise and fatigue   ? Paroxysmal atrial fibrillation (HCC)   ? Pneumonia 10/27/2015  ? led to CHF, in hospital x 6 days  ? S/P CABG x 6   ? 2007  ? Ureteral obstruction, right   ? ?Past Surgical History:  ?Procedure Laterality Date  ? APPENDECTOMY  1950's  ? CARDIAC CATHETERIZATION  10-31-2005  dr wall  ? severe three vessel/  perserved LV  ? CARDIOVASCULAR STRESS TEST  last one 05-19-2014  dr Stanford Breed  ? low risk lexiscan no exercise study/ small inferobasal infarct with no  ischemia /  ef 53%  ? CARDIOVERSION N/A 10/03/2021  ? Procedure: CARDIOVERSION;  Surgeon: Werner Lean, MD;  Location: [place];  Service: Cardiovascular;  Laterality: N/A;  ? CARDIOVERSION N/A 11/25/2021  ? Procedure: CARDIOVERSION;  Surgeon: Janina Mayo, MD;  Location: [place];  Service: Cardiovascular;  Laterality: N/A;  ? CORONARY ARTERY BYPASS GRAFT  11-01-2005  dr Roxy Manns  ? CLOSURE OF PFO/  LIMA to LAD,  SVG to D1,  SVG to 1st & 2nd  MARGINAL branches, SVG  to PDA and  posterior lateral  ? CYSTOSCOPY W/ URETERAL STENT PLACEMENT Right 05/21/2014  ? Procedure: CYSTOSCOPY WITH RETROGRADE PYELOGRAM/URETERAL STENT PLACEMENT;  Surgeon: Ailene Rud, MD;  Location: [facility];  Service: Urology;  Laterality: Right;  ? CYSTOSCOPY WITH URETEROSCOPY Right 05/21/2014  ? Procedure: CYSTOSCOPY WITH URETEROSCOPY;  Surgeon: Ailene Rud, MD;  Location[facility];  Service: Urology;  Laterality: Right;  ? INGUINAL HERNIA REPAIR Bilateral   ? LAPAROSCOPIC CHOLECYSTECTOMY  10-02-2000  ? PACEMAKER IMPLANT N/A 10/11/2021  ? Procedure: PACEMAKER IMPLANT;  Surgeon: Evans Lance, MD;  Location: [place] [facility];  Service: Cardiovascular;  Laterality: N/A;  ? RIGHT URETER OBSTRUCTION SURGERY  age 77  ? TEE WITHOUT CARDIOVERSION N/A 07/17/2013  ? Procedure: TRANSESOPHAGEAL ECHOCARDIOGRAM (TEE);  Surgeon: Josue Hector, MD;  Location: [facility];  Service: Cardiovascular;  Laterality: N/A;  normal LV size, mild prolapse of posterior mitral valve leaflet,  mild MR and TR, normal AV,  no LAA thrombus,  atrial septal aneurysm with no PFO/ ASD and negative bubble study, normal RV, normal aorta with no debris  ?  ? ?No outpatient medications have been marked as taking for the 12/19/21 encounter (Appointment) with Lelon Perla, MD.  ?  ? ?Allergies:   Cephalexin, Iodinated contrast media, Iohexol, Ace inhibitors, Metoprolol, Viagra [sildenafil citrate], Ciprofloxacin, Codeine, and Sulfa antibiotics  ? ?Social History  ? ?Tobacco Use  ? Smoking status: Former  ?  Packs/day: 1.00  ?  Years: 35.00  ?  Pack years: 35.00  ?  Types: Cigarettes  ?  Quit date: 08/29/1977  ?  Years since quitting: 44.3  ? Smokeless tobacco: Never  ?Substance Use Topics  ? Alcohol use: No  ?  Alcohol/week: 0.0 standard drinks  ? Drug use: No  ?  ? ?Family Hx: ?The patient's family history includes Aneurysm in his brother; CAD in his brother; Depression in his sister; Heart disease in  his mother; Hypertension in his mother; Lung cancer in his brother; Lymphoma in his father; Other in his brother and sister; Prostate cancer in his brother and father. ? ?ROS:   ?Please see the history of present illness.    ?No Fever, chills  or productive cough ?All other systems reviewed and are negative. ? ?Recent Labs: ?10/10/2021: B Natriuretic Peptide 307.6; Magnesium 2.0; Platelets 139 ?11/08/2021: ALT 13; TSH 1.510 ?11/25/2021: BUN 32; Creatinine, Ser 2.00; Hemoglobin 16.7; Potassium 4.4; Sodium 139  ? ?Recent Lipid Panel ?Lab Results  ?Component Value Date/Time  ? CHOL 83 (L) 09/28/2021 08:57 AM  ? TRIG 51 09/28/2021 08:57 AM  ? HDL 38 (L) 09/28/2021 08:57 AM  ? CHOLHDL 2.2 09/28/2021 08:57 AM  ? CHOLHDL 2.6 04/22/2014 09:02 AM  ? LDLCALC 32 09/28/2021 08:57 AM  ? ? ?Wt Readings from Last 3 Encounters:  ?12/14/21 180 lb (81.6 kg)  ?11/25/21 176 lb (79.8 kg)  ?11/08/21 176 lb (79.8 kg)  ?  ? ?  Objective:   ? ?Vital Signs:  There were no vitals taken for this visit.  ? ?VITAL SIGNS:  reviewed ?NAD ?Answers questions appropriately ?Normal affect ?Remainder of physical examination not performed (telehealth visit; coronavirus pandemic) ? ?ASSESSMENT & PLAN:   ? ?Paroxysmal atrial fibrillation-patient was recently noted to have recurrent atrial fibrillation.  Plan is for cardioversion now that amiodarone has been loaded.  We will continue amiodarone and Xarelto at present dose and proceed with cardioversion.  Continue Xarelto.  If atrial fibrillation recurs despite amiodarone would favor rate control as he is relatively asymptomatic.  Note the patient wanted to discuss Watchman device today as he felt it may eliminate atrial fibrillation.  I explained that it was indicated to decrease the risk of an embolic event in patients high risk for anticoagulation or who did not tolerate.  He would not be in that category and at present I think we should continue with Xarelto and not proceed with watchman.  He is in  agreement. ?Pacemaker-managed by electrophysiology. ?Coronary artery disease-patient denies chest pain.  Continue statin.  No aspirin given need for Xarelto. ?Chronic combined systolic/diastolic congestive heart

## 2021-12-19 NOTE — Telephone Encounter (Signed)
Spoke with Jose [name], video visit scheduled with dr Jens Som to discuss watchman. ?

## 2021-12-19 NOTE — Progress Notes (Signed)
? ?Virtual Visit via Video Note  ? ?This visit type was conducted due to national recommendations for restrictions regarding the COVID-19 Pandemic (e.g. social distancing) in an effort to limit this patient's exposure and mitigate transmission in our community.  Due to his co-morbid illnesses, this patient is at least at moderate risk for complications without adequate follow up.  This format is felt to be most appropriate for this patient at this time.  All issues noted in this document were discussed and addressed.  A limited physical exam was performed with this format.  Please refer to the patient's chart for his consent to telehealth for [facility]. ? ?   ? ?Date:  12/19/2021  ? ?ID:  Jose [name], DOB 11/04/1930, MRN [id number] ? ?Patient Location:Home ?Provider Location: Home ? ?PCP:  Blomgren, Peter, MD  ?Cardiologist:  Dr Bartosz Luginbill ? ?Evaluation Performed:  Follow-Up Visit ? ?Chief Complaint:  FU CAD and atrial fibrillation ? ?History of Present Illness:   ? ?FU CAD and atrial fibrillation. Patient underwent coronary artery bypass and graft in 2007. He had a LIMA to the LAD, SVG to D1, sequential SVG to OM1 and OM2, sequential SVG to the PDA and posterior lateral. He also had closure of PFO. Abdominal ultrasound May 2014 showed no aneurysm. Had CVA in November of 2014. Transesophageal echocardiogram November 2014 showed normal LV function, mild prolapse of the posterior mitral valve leaflet and an atrial septal aneurysm with negative bubble study. Carotid Dopplers November 2014 showed less than 40% bilateral stenosis. 30 day event monitor showed atrial fibrillation. Nuclear study May 2017 showed ejection fraction 53%.  There was diaphragmatic attenuation but no ischemia. Echocardiogram February 2023 showed ejection fraction 45 to 50%, mild left atrial enlargement.  Patient had cardioversion October 03, 2021.  Subsequently noted to have symptomatic bradycardia.  Had pacemaker placed February 2023.  He  was subsequently placed on amiodarone and underwent repeat cardioversion.  Patient contacted the office in morning to discuss Watchman device.  Since last seen, there is no dyspnea, chest pain, palpitations or syncope.  He does not fall and there is no bleeding. ? ?The patient does not have symptoms concerning for COVID-19 infection (fever, chills, cough, or new shortness of breath).  ? ? ?Past Medical History:  ?Diagnosis Date  ? At risk for sleep apnea   ? STOP-BANG= 5   SENT TO PCP 05-20-2014  ? Bilateral carotid artery stenosis   ? mild bilateral proximcal ICA  40% per duplex 11/ 2014  ? Coronary artery disease   ? a. s/p CABG in 2007 w/ LIMA-LAD, SVG-D1, SVG-1st & 2nd Mrg, SVG-PDA and posterior lateral  ? ED (erectile dysfunction)   ? Eye infection 12/27/2014  ? GERD (gastroesophageal reflux disease)   ? History of embolic stroke no residual  ? 11/ 2014  --  right PCA and MCA branch infarts (left side vision difficulties)  ? Hypertension   ? Other malaise and fatigue   ? Paroxysmal atrial fibrillation (HCC)   ? Pneumonia 10/27/2015  ? led to CHF, in hospital x 6 days  ? S/P CABG x 6   ? 2007  ? Ureteral obstruction, right   ? ?Past Surgical History:  ?Procedure Laterality Date  ? APPENDECTOMY  1950's  ? CARDIAC CATHETERIZATION  10-31-2005  dr wall  ? severe three vessel/  perserved LV  ? CARDIOVASCULAR STRESS TEST  last one 05-19-2014  dr Braelyn Jenson  ? low risk lexiscan no exercise study/ small inferobasal infarct with no   ischemia /  ef 53%  ? CARDIOVERSION N/A 10/03/2021  ? Procedure: CARDIOVERSION;  Surgeon: Chandrasekhar, Mahesh A, MD;  Location: [facility];  Service: Cardiovascular;  Laterality: N/A;  ? CARDIOVERSION N/A 11/25/2021  ? Procedure: CARDIOVERSION;  Surgeon: Branch, Mary E, MD;  Location: [facility];  Service: Cardiovascular;  Laterality: N/A;  ? CORONARY ARTERY BYPASS GRAFT  11-01-2005  dr owen  ? CLOSURE OF PFO/  LIMA to LAD,  SVG to D1,  SVG to 1st & 2nd  MARGINAL branches, SVG  to PDA and  posterior lateral  ? CYSTOSCOPY W/ URETERAL STENT PLACEMENT Right 05/21/2014  ? Procedure: CYSTOSCOPY WITH RETROGRADE PYELOGRAM/URETERAL STENT PLACEMENT;  Surgeon: Sigmund I Tannenbaum, MD;  Location: [facility];  Service: Urology;  Laterality: Right;  ? CYSTOSCOPY WITH URETEROSCOPY Right 05/21/2014  ? Procedure: CYSTOSCOPY WITH URETEROSCOPY;  Surgeon: Sigmund I Tannenbaum, MD;  Location: [facility];  Service: Urology;  Laterality: Right;  ? INGUINAL HERNIA REPAIR Bilateral   ? LAPAROSCOPIC CHOLECYSTECTOMY  10-02-2000  ? PACEMAKER IMPLANT N/A 10/11/2021  ? Procedure: PACEMAKER IMPLANT;  Surgeon: Taylor, Gregg W, MD;  [facility];  Service: Cardiovascular;  Laterality: N/A;  ? RIGHT URETER OBSTRUCTION SURGERY  age 23  ? TEE WITHOUT CARDIOVERSION N/A 07/17/2013  ? Procedure: TRANSESOPHAGEAL ECHOCARDIOGRAM (TEE);  Surgeon: Peter C Nishan, MD;  Location: [facility];  Service: Cardiovascular;  Laterality: N/A;  normal LV size, mild prolapse of posterior mitral valve leaflet,  mild MR and TR, normal AV,  no LAA thrombus,  atrial septal aneurysm with no PFO/ ASD and negative bubble study, normal RV, normal aorta with no debris  ?  ? ?No outpatient medications have been marked as taking for the 12/19/21 encounter (Appointment) with Tong[place] Prout S, MD.  ?  ? ?Allergies:   Cephalexin, Iodinated contrast media, Iohexol, Ace inhibitors, Metoprolol, Viagra [sildenafil citrate], Ciprofloxacin, Codeine, and Sulfa antibiotics  ? ?Social History  ? ?Tobacco Use  ? Smoking status: Former  ?  Packs/day: 1.00  ?  Years: 35.00  ?  Pack years: 35.00  ?  Types: Cigarettes  ?  Quit date: 08/29/1977  ?  Years since quitting: 44.3  ? Smokeless tobacco: Never  ?Substance Use Topics  ? Alcohol use: No  ?  Alcohol/week: 0.0 standard drinks  ? Drug use: No  ?  ? ?Family Hx: ?The patient's family history includes Aneurysm in his brother; CAD in his brother; Depression in his sister; Heart disease in  his mother; Hypertension in his mother; Lung cancer in his brother; Lymphoma in his father; Other in his brother and sister; Prostate cancer in his brother and father. ? ?ROS:   ?Please see the history of present illness.    ?No Fever, chills  or productive cough ?All other systems reviewed and are negative. ? ?Recent Labs: ?10/10/2021: B Natriuretic Peptide 307.6; Magnesium 2.0; Platelets 139 ?11/08/2021: ALT 13; TSH 1.510 ?11/25/2021: BUN 32; Creatinine, Ser 2.00; Hemoglobin 16.7; Potassium 4.4; Sodium 139  ? ?Recent Lipid Panel ?Lab Results  ?Component Value Date/Time  ? CHOL 83 (L) 09/28/2021 08:57 AM  ? TRIG 51 09/28/2021 08:57 AM  ? HDL 38 (L) 09/28/2021 08:57 AM  ? CHOLHDL 2.2 09/28/2021 08:57 AM  ? CHOLHDL 2.6 04/22/2014 09:02 AM  ? LDLCALC 32 09/28/2021 08:57 AM  ? ? ?Wt Readings from Last 3 Encounters:  ?12/14/21 180 lb (81.6 kg)  ?11/25/21 176 lb (79.8 kg)  ?11/08/21 176 lb (79.8 kg)  ?  ? ?  Objective:   ? ?Vital Signs:  There were no vitals taken for this visit.  ? ?VITAL SIGNS:  reviewed ?NAD ?Answers questions appropriately ?Normal affect ?Remainder of physical examination not performed (telehealth visit; coronavirus pandemic) ? ?ASSESSMENT & PLAN:   ? ?Paroxysmal atrial fibrillation-patient was recently noted to have recurrent atrial fibrillation.  Plan is for cardioversion now that amiodarone has been loaded.  We will continue amiodarone and Xarelto at present dose and proceed with cardioversion.  Continue Xarelto.  If atrial fibrillation recurs despite amiodarone would favor rate control as he is relatively asymptomatic.  Note the patient wanted to discuss Watchman device today as he felt it may eliminate atrial fibrillation.  I explained that it was indicated to decrease the risk of an embolic event in patients high risk for anticoagulation or who did not tolerate.  He would not be in that category and at present I think we should continue with Xarelto and not proceed with watchman.  He is in  agreement. ?Pacemaker-managed by electrophysiology. ?Coronary artery disease-patient denies chest pain.  Continue statin.  No aspirin given need for Xarelto. ?Chronic combined systolic/diastolic congestive heart

## 2021-12-21 ENCOUNTER — Encounter (HOSPITAL_COMMUNITY): Payer: Self-pay | Admitting: Cardiology

## 2021-12-28 ENCOUNTER — Ambulatory Visit (HOSPITAL_COMMUNITY)
Admission: RE | Admit: 2021-12-28 | Discharge: 2021-12-28 | Disposition: A | Discharge status: Home / Self Care | Payer: Medicare Other | Source: Ambulatory Visit | Attending: Cardiology | Admitting: Cardiology

## 2021-12-28 ENCOUNTER — Ambulatory Visit (HOSPITAL_COMMUNITY): Payer: Medicare Other | Admitting: Certified Registered Nurse Anesthetist

## 2021-12-28 ENCOUNTER — Other Ambulatory Visit: Payer: Self-pay

## 2021-12-28 ENCOUNTER — Ambulatory Visit (HOSPITAL_BASED_OUTPATIENT_CLINIC_OR_DEPARTMENT_OTHER): Payer: Medicare Other | Admitting: Certified Registered Nurse Anesthetist

## 2021-12-28 ENCOUNTER — Encounter (HOSPITAL_COMMUNITY): Payer: Self-pay | Admitting: Cardiology

## 2021-12-28 ENCOUNTER — Encounter (HOSPITAL_COMMUNITY)
Admission: RE | Disposition: A | Discharge status: Home / Self Care | Payer: PRIVATE HEALTH INSURANCE | Source: Ambulatory Visit | Attending: Cardiology

## 2021-12-28 DIAGNOSIS — E785 Hyperlipidemia, unspecified: Secondary | ICD-10-CM | POA: Diagnosis not present

## 2021-12-28 DIAGNOSIS — Z950 Presence of cardiac pacemaker: Secondary | ICD-10-CM | POA: Diagnosis not present

## 2021-12-28 DIAGNOSIS — I110 Hypertensive heart disease with heart failure: Secondary | ICD-10-CM | POA: Insufficient documentation

## 2021-12-28 DIAGNOSIS — I4891 Unspecified atrial fibrillation: Secondary | ICD-10-CM

## 2021-12-28 DIAGNOSIS — Z87891 Personal history of nicotine dependence: Secondary | ICD-10-CM | POA: Insufficient documentation

## 2021-12-28 DIAGNOSIS — Z951 Presence of aortocoronary bypass graft: Secondary | ICD-10-CM | POA: Diagnosis not present

## 2021-12-28 DIAGNOSIS — I509 Heart failure, unspecified: Secondary | ICD-10-CM

## 2021-12-28 DIAGNOSIS — Z79899 Other long term (current) drug therapy: Secondary | ICD-10-CM | POA: Insufficient documentation

## 2021-12-28 DIAGNOSIS — Z7901 Long term (current) use of anticoagulants: Secondary | ICD-10-CM | POA: Insufficient documentation

## 2021-12-28 DIAGNOSIS — Z8673 Personal history of transient ischemic attack (TIA), and cerebral infarction without residual deficits: Secondary | ICD-10-CM | POA: Insufficient documentation

## 2021-12-28 DIAGNOSIS — I2510 Atherosclerotic heart disease of native coronary artery without angina pectoris: Secondary | ICD-10-CM

## 2021-12-28 DIAGNOSIS — I480 Paroxysmal atrial fibrillation: Secondary | ICD-10-CM | POA: Diagnosis present

## 2021-12-28 DIAGNOSIS — K219 Gastro-esophageal reflux disease without esophagitis: Secondary | ICD-10-CM | POA: Diagnosis not present

## 2021-12-28 HISTORY — PX: CARDIOVERSION: SHX1299

## 2021-12-28 SURGERY — CARDIOVERSION
Anesthesia: General

## 2021-12-28 MED ORDER — SODIUM CHLORIDE 0.9 % IV SOLN: INTRAVENOUS | Status: DC

## 2021-12-28 MED ORDER — PROPOFOL 10 MG/ML IV BOLUS
INTRAVENOUS | Status: DC | PRN
  Administered 2021-12-28: 50 mg via INTRAVENOUS

## 2021-12-28 MED ORDER — LIDOCAINE 2% (20 MG/ML) 5 ML SYRINGE
INTRAMUSCULAR | Status: DC | PRN
  Administered 2021-12-28: 80 mg via INTRAVENOUS

## 2021-12-28 NOTE — Interval H&P Note (Signed)
History and Physical Interval Note: ? ?12/28/2021 ?9:27 AM ? ?Jose [name]  has presented today for surgery, with the diagnosis of AFIB.  The various methods of treatment have been discussed with the patient and family. After consideration of risks, benefits and other options for treatment, the patient has consented to  Procedure(s): ?CARDIOVERSION (N/A) as a surgical intervention.  The patient's history has been reviewed, patient examined, no change in status, stable for surgery.  I have reviewed the patient's chart and labs.  Questions were answered to the patient's satisfaction.   ? ? ?[name] ? ? ?

## 2021-12-28 NOTE — Progress Notes (Signed)
Interigated medtronic pacemaker device.  [employer] called to say pt is in afib ?

## 2021-12-28 NOTE — CV Procedure (Signed)
? ?  Electrical Cardioversion Procedure Note ?Jose [name] ?[id number] ?10-30-1930 ? ?Procedure: Electrical Cardioversion ?Indications:  Atrial Fibrillation ? ?Time Out: Verified patient identification, verified procedure,medications/allergies/relevent history reviewed, required imaging and test results available.  Performed ? ?Procedure Details ? ?Prior to the procedure the patient's Medtronic device was interrogated showing underlying atrial fibrillation since 11/27/2021. He was also 99% V-paced. Base rate at 60 bpm. ?f ? ?The patient signed informed consent.   The patient was NPO past midnight. Has had therapeutic anticoagulation with Xeralto greater than 3 weeks. The patient denies any interruption of anticoagulation.  Anesthesia was administered by Dr. Stephannie Peters.  Adequate airway was maintained throughout and vital followed per protocol.  He was cardioverted x 1 with 200 J of biphasic synchronized energy.  He converted to NSR.  There were no apparent complications.  The patient tolerated the procedure well and had normal neuro status and respiratory status post procedure with vitals stable as recorded elsewhere.   ? ? ?IMPRESSION: ? ?Successful cardioversion of atrial fibrillation ? ? ?Follow up:  We will arrange follow up with Dr. Jens Som as scheduled.  He will continue on current medical therapy.  The patient advised to continue anticoagulation. ? ?Thomasene Ripple ?12/28/2021, 10:15 AM ?  ?

## 2021-12-28 NOTE — Anesthesia Postprocedure Evaluation (Signed)
Anesthesia Post Note ? ?Patient: AGASTYA MEISTER ? ?Procedure(s) Performed: CARDIOVERSION ? ?  ? ?Patient location during evaluation: PACU ?Anesthesia Type: General ?Level of consciousness: awake and alert ?Pain management: pain level controlled ?Vital Signs Assessment: post-procedure vital signs reviewed and stable ?Respiratory status: spontaneous breathing, nonlabored ventilation and respiratory function stable ?Cardiovascular status: blood pressure returned to baseline ?Postop Assessment: no apparent nausea or vomiting ?Anesthetic complications: no ? ? ?No notable events documented. ? ?Last Vitals:  ?Vitals:  ? 12/28/21 1030 12/28/21 1040  ?BP: 140/75 (!) 163/89  ?Pulse: (!) 59 (!) 59  ?Resp: 15 16  ?Temp:    ?SpO2: 94% 97%  ?  ?Last Pain:  ?Vitals:  ? 12/28/21 1030  ?TempSrc:   ?PainSc: 0-No pain  ? ? ?  ?  ?  ?  ?  ?  ? ?Shanda Howells ? ? ? ? ?

## 2021-12-28 NOTE — Anesthesia Procedure Notes (Signed)
Procedure Name: General with mask airway ?Date/Time: 12/28/2021 10:08 AM ?Performed by: Waynard Edwards, CRNA ?Pre-anesthesia Checklist: Patient identified, Timeout performed, Emergency Drugs available, Suction available and Patient being monitored ?Patient Re-evaluated:Patient Re-evaluated prior to induction ?Oxygen Delivery Method: Ambu bag ?Preoxygenation: Pre-oxygenation with 100% oxygen ?Induction Type: IV induction ?Ventilation: Mask ventilation without difficulty ?Placement Confirmation: breath sounds checked- equal and bilateral ?Dental Injury: Teeth and Oropharynx as per pre-operative assessment  ? ? ? ? ?

## 2021-12-28 NOTE — Anesthesia Preprocedure Evaluation (Signed)
Anesthesia Evaluation  ?Patient identified by MRN, date of birth, ID band ?Patient awake ? ? ? ?Reviewed: ?Allergy & Precautions, NPO status , Patient's Chart, lab work & pertinent test results, reviewed documented beta blocker date and time  ? ?History of Anesthesia Complications ?Negative for: history of anesthetic complications ? ?Airway ?Mallampati: II ? ?TM Distance: >3 FB ?Neck ROM: Full ? ? ? Dental ? ?(+) Edentulous Lower, Edentulous Upper ?  ?Pulmonary ?former smoker,  ?  ?Pulmonary exam normal ? ? ? ? ? ? ? Cardiovascular ?hypertension, Pt. on medications and Pt. on home beta blockers ?+ CAD, + CABG (2007) and +CHF  ?Normal cardiovascular exam+ dysrhythmias Atrial Fibrillation  ? ?TTE 09/2021: EF 45-50%, basal to mid inferolateral akinesis, mild LVH, RV systolic function moderately reduced, mild LAE ?  ?Neuro/Psych ?negative neurological ROS ? negative psych ROS  ? GI/Hepatic ?Neg liver ROS, GERD  Medicated and Controlled,  ?Endo/Other  ?negative endocrine ROS ? Renal/GU ?Renal InsufficiencyRenal disease  ?negative genitourinary ?  ?Musculoskeletal ?negative musculoskeletal ROS ?(+)  ? Abdominal ?  ?Peds ? Hematology ?negative hematology ROS ?(+)   ?Anesthesia Other Findings ?Day of surgery medications reviewed with patient. ? Reproductive/Obstetrics ?negative OB ROS ? ?  ? ? ? ? ? ? ? ? ? ? ? ? ? ?  ?  ? ? ? ? ? ? ? ? ?Anesthesia Physical ?Anesthesia Plan ? ?ASA: 3 ? ?Anesthesia Plan: General  ? ?Post-op Pain Management: Minimal or no pain anticipated  ? ?Induction: Intravenous ? ?PONV Risk Score and Plan: Treatment may vary due to age or medical condition and Propofol infusion ? ?Airway Management Planned: Mask ? ?Additional Equipment: None ? ?Intra-op Plan:  ? ?Post-operative Plan:  ? ?Informed Consent: I have reviewed the patients History and Physical, chart, labs and discussed the procedure including the risks, benefits and alternatives for the proposed anesthesia with  the patient or authorized representative who has indicated his/her understanding and acceptance.  ? ? ? ? ? ?Plan Discussed with: CRNA ? ?Anesthesia Plan Comments:   ? ? ? ? ? ? ?Anesthesia Quick Evaluation ? ?

## 2021-12-28 NOTE — Transfer of Care (Signed)
Immediate Anesthesia Transfer of Care Note ? ?Patient: Jose [name] ? ?Procedure(s) Performed: CARDIOVERSION ? ?Patient Location: Endoscopy Unit ? ?Anesthesia Type:General ? ?Level of Consciousness: drowsy ? ?Airway & Oxygen Therapy: Patient Spontanous Breathing ? ?Post-op Assessment: Report given to RN and Post -op Vital signs reviewed and stable ? ?Post vital signs: Reviewed and stable ? ?Last Vitals:  ?Vitals Value Taken Time  ?BP 128/63 1016 12/28/21  ?Temp    ?Pulse 60 1016 12/28/21  ?Resp 14 1016 12/28/21  ?SpO2 97 1016 12/28/21  ? ? ?Last Pain:  ?Vitals:  ? 12/28/21 0955  ?PainSc: 0-No pain  ?   ? ?  ? ?Complications: No notable events documented. ?

## 2021-12-30 ENCOUNTER — Encounter (HOSPITAL_COMMUNITY): Payer: Self-pay | Admitting: Cardiology

## 2022-01-12 ENCOUNTER — Ambulatory Visit (INDEPENDENT_AMBULATORY_CARE_PROVIDER_SITE_OTHER): Payer: Medicare Other

## 2022-01-12 DIAGNOSIS — I255 Ischemic cardiomyopathy: Secondary | ICD-10-CM

## 2022-01-13 ENCOUNTER — Encounter: Payer: Self-pay | Admitting: Internal Medicine

## 2022-01-13 ENCOUNTER — Ambulatory Visit (INDEPENDENT_AMBULATORY_CARE_PROVIDER_SITE_OTHER): Payer: Medicare Other | Admitting: Internal Medicine

## 2022-01-13 VITALS — BP 142/84 | HR 84 | Ht 69.0 in | Wt 180.6 lb

## 2022-01-13 DIAGNOSIS — R001 Bradycardia, unspecified: Secondary | ICD-10-CM

## 2022-01-13 DIAGNOSIS — I2510 Atherosclerotic heart disease of native coronary artery without angina pectoris: Secondary | ICD-10-CM | POA: Diagnosis not present

## 2022-01-13 DIAGNOSIS — I10 Essential (primary) hypertension: Secondary | ICD-10-CM

## 2022-01-13 DIAGNOSIS — I480 Paroxysmal atrial fibrillation: Secondary | ICD-10-CM

## 2022-01-13 DIAGNOSIS — I255 Ischemic cardiomyopathy: Secondary | ICD-10-CM | POA: Diagnosis not present

## 2022-01-13 DIAGNOSIS — Z950 Presence of cardiac pacemaker: Secondary | ICD-10-CM | POA: Diagnosis not present

## 2022-01-13 NOTE — Progress Notes (Signed)
HPI [name] returns today for followup. He is a pleasant 88 yo man with CHB who underwent PPM insertion and then developed atrial fib with a CVR. He has been stable in the interim. He denies palpitations or chest pain. No sob.  Allergies  Allergen Reactions   Cephalexin Anaphylaxis    Pt says he has tolerated PCN in the past   Iodinated Contrast Media Anaphylaxis   Iohexol Anaphylaxis     THROAT SWELLS AND STOPS BREATHING    Ace Inhibitors Cough   Metoprolol Other (See Comments)    Erectile dysfunction   Viagra [Sildenafil Citrate] Other (See Comments)    Headache and vision changes   Ciprofloxacin Hives, Itching and Rash   Codeine Nausea And Vomiting and Other (See Comments)    headaches   Sulfa Antibiotics Rash, Hives and Itching     Current Outpatient Medications  Medication Sig Dispense Refill   acetaminophen (TYLENOL) 500 MG tablet Take 1,000 mg by mouth every 6 (six) hours as needed (Arthritis).     carvedilol (COREG) 3.125 MG tablet Take 1 tablet (3.125 mg total) by mouth 2 (two) times daily with a meal. 60 tablet 5   cholecalciferol (VITAMIN D) 25 MCG (1000 UNIT) tablet Take 1,000 Units by mouth in the morning.     COD LIVER OIL PO Take 1 capsule by mouth at bedtime.      ezetimibe (ZETIA) 10 MG tablet Take 10 mg by mouth every morning.     loratadine (CLARITIN) 10 MG tablet Take 10 mg by mouth daily as needed for allergies.     losartan (COZAAR) 100 MG tablet Take 100 mg by mouth in the morning.     Multiple Vitamin (MULTIVITAMIN WITH MINERALS) TABS tablet Take 1 tablet by mouth in the morning.     omeprazole (PRILOSEC) 20 MG capsule Take 20 mg by mouth every other day. In the morning.     Polyethyl Glycol-Propyl Glycol (SYSTANE ULTRA OP) Place 1-2 drops into both eyes in the morning and at bedtime.     pravastatin (PRAVACHOL) 80 MG tablet Take 80 mg by mouth at bedtime.     saw palmetto 160 MG capsule Take 160 mg by mouth 2 (two) times daily.     tadalafil  (CIALIS) 5 MG tablet Take 5 mg by mouth at bedtime.     TESTOSTERONE TD Place 1 mL onto the skin See admin instructions. 10% testosterone cream compounded at [name]  - apply 1 ml topically to shoulders once every morning     XARELTO 15 MG TABS tablet TAKE ONE (1) TABLET BY MOUTH EACH DAY WITH SUPPER 90 tablet 2   furosemide (LASIX) 20 MG tablet Take 2 tablets (40 mg total) by mouth daily. 180 tablet 3   No current facility-administered medications for this visit.     Past Medical History:  Diagnosis Date   At risk for sleep apnea    STOP-BANG= 5   SENT TO PCP 05-20-2014   Bilateral carotid artery stenosis    mild bilateral proximcal ICA  40% per duplex 11/ 2014   Coronary artery disease    a. s/p CABG in 2007 w/ LIMA-LAD, SVG-D1, SVG-1st & 2nd Mrg, SVG-PDA and posterior lateral   ED (erectile dysfunction)    Eye infection 12/27/2014   GERD (gastroesophageal reflux disease)    History of embolic stroke no residual   11/ 2014  --  right PCA and MCA branch infarts (left side vision  difficulties)   Hypertension    Other malaise and fatigue    Paroxysmal atrial fibrillation ([place])    Pneumonia 10/27/2015   led to CHF, in hospital x 6 days   S/P CABG x 6    2007   Ureteral obstruction, right     ROS:   All systems reviewed and negative except as noted in the HPI.   Past Surgical History:  Procedure Laterality Date   APPENDECTOMY  1950's   CARDIAC CATHETERIZATION  10-31-2005  Jose [name]   severe three vessel/  perserved LV   CARDIOVASCULAR STRESS TEST  last one 05-19-2014  Jose Jose [name]   low risk lexiscan no exercise study/ small inferobasal infarct with no ischemia /  ef 53%   CARDIOVERSION N/A 10/03/2021   Procedure: CARDIOVERSION;  Surgeon: Jose Lean, MD;  Location: [place];  Service: Cardiovascular;  Laterality: N/A;   CARDIOVERSION N/A 11/25/2021   Procedure: CARDIOVERSION;  Surgeon: Jose Mayo, MD;  [facility];  Service:  Cardiovascular;  Laterality: N/A;   CARDIOVERSION N/A 12/28/2021   Procedure: CARDIOVERSION;  Surgeon: Jose Salines, DO;  Location: [place];  Service: Cardiovascular;  Laterality: N/A;   CORONARY ARTERY BYPASS GRAFT  11-01-2005  Jose Jose [name]   CLOSURE OF PFO/  LIMA to LAD,  SVG to D1,  SVG to 1st & 2nd  MARGINAL branches, SVG  to PDA and posterior lateral   CYSTOSCOPY W/ URETERAL STENT PLACEMENT Right 05/21/2014   Procedure: CYSTOSCOPY WITH RETROGRADE PYELOGRAM/URETERAL STENT PLACEMENT;  Surgeon: Jose Rud, MD;  Location[facility];  Service: Urology;  Laterality: Right;   CYSTOSCOPY WITH URETEROSCOPY Right 05/21/2014   Procedure: CYSTOSCOPY WITH URETEROSCOPY;  Surgeon: Jose Rud, MD;  [facility];  Service: Urology;  Laterality: Right;   INGUINAL HERNIA REPAIR Bilateral    LAPAROSCOPIC CHOLECYSTECTOMY  10-02-2000   PACEMAKER IMPLANT N/A 10/11/2021   Procedure: PACEMAKER IMPLANT;  Surgeon: Jose Lance, MD;  Location: [place] [facility];  Service: Cardiovascular;  Laterality: N/A;   RIGHT URETER OBSTRUCTION SURGERY  age 39   TEE WITHOUT CARDIOVERSION N/A 07/17/2013   Procedure: TRANSESOPHAGEAL ECHOCARDIOGRAM (TEE);  Surgeon: Jose Hector, MD;  Location: [facility] END[facility]COPY;  Service: Cardiovascular;  Laterality: N/A;  normal LV size, mild prolapse of posterior mitral valve leaflet,  mild MR and TR, normal AV,  no LAA thrombus,  atrial septal aneurysm with no PFO/ ASD and negative bubble study, normal RV, normal aorta with no debris     Family History  Problem Relation Age of Onset   Heart disease Mother        PPM   Hypertension Mother    Lymphoma Father    Prostate cancer Father    Prostate cancer Brother    Aneurysm Brother        REPAIR   CAD Brother        CABG   Other Brother        STENTS IN LEGS   Other Sister        STOMACH ISSUES   Depression Sister    Lung cancer Brother        Asbestosis     Social History    Socioeconomic History   Marital status: Married    Spouse name: Jose [name]   Number of children: 4   Years of education: college4   Highest education level: Not on file  Occupational History   Occupation: retired  Tobacco Use  Smoking status: Former    Packs/day: 1.00    Years: 35.00    Pack years: 35.00    Types: Cigarettes    Quit date: 08/29/1977    Years since quitting: 44.4   Smokeless tobacco: Never  Substance and Sexual Activity   Alcohol use: No    Alcohol/week: 0.0 standard drinks   Drug use: No   Sexual activity: Not on file  Other Topics Concern   Not on file  Social History Narrative   Patient is married with 4 children.   Patient is right handed.   Patient has college education.   Patient drinks decaff coffee.   Social Determinants of Health   Financial Resource Strain: Not on file  Food Insecurity: Not on file  Transportation Needs: Not on file  Physical Activity: Not on file  Stress: Not on file  Social Connections: Not on file  Intimate Partner Violence: Not on file     BP (!) 142/84   Pulse 84   Ht 5\' 9"  (1.753 m)   Wt 180 lb 9.6 oz (81.9 kg)   SpO2 96%   BMI 26.67 kg/m   Physical Exam:  Well appearing NAD HEENT: Unremarkable Neck:  No JVD, no thyromegally Lymphatics:  No adenopathy Back:  No CVA tenderness Lungs:  Clear with no wheezes HEART:  Regular rate rhythm, no murmurs, no rubs, no clicks Abd:  soft, positive bowel sounds, no organomegally, no rebound, no guarding Ext:  2 plus pulses, no edema, no cyanosis, no clubbing Skin:  No rashes no nodules Neuro:  CN II through XII intact, motor grossly intact  EKG - atrial fib with ventricular pacing  DEVICE  Normal device function.  See PaceArt for details.   Assess/Plan:  Atrial fib - his VR is well controlled. He has not maintained NSR despite amiodarone. I have asked him to stop amiodarone. HTN - his bp is minimally elevated but tends to be so in the MD's office. I encouraged a  low sodium diet. PPM -his medtronic DDD PM is working normally Dyslipidemia - he will continue Zetia and pravastatin.  Jose Overlie Santia Labate,MD

## 2022-01-13 NOTE — Patient Instructions (Addendum)
Medication Instructions:  Your physician has recommended you make the following change in your medication:    STOP amiodarone  Labwork: None ordered.  Testing/Procedures: None ordered.  Follow-Up: Your physician wants you to follow-up in: one year with Lewayne Bunting, MD or one of the following Advanced Practice Providers on your designated Care Team:   Francis Dowse, [place] Casimiro Needle "Mardelle Matte" Lanna Poche, [place]  Remote monitoring is used to monitor your Pacemaker from home. This monitoring reduces the number of office visits required to check your device to one time per year. It allows [place] to keep an eye on the functioning of your device to ensure it is working properly. You are scheduled for a device check from home on 04/13/2022. You may send your transmission at any time that day. If you have a wireless device, the transmission will be sent automatically. After your physician reviews your transmission, you will receive a postcard with your next transmission date.  Any Other Special Instructions Will Be Listed Below (If Applicable).  If you need a refill on your cardiac medications before your next appointment, please call your pharmacy.   Important Information About Sugar

## 2022-01-16 LAB — CUP PACEART REMOTE DEVICE CHECK
Battery Remaining Longevity: 149 mo
Battery Voltage: 3.17 V
Brady Statistic RA Percent Paced: 0.37 %
Brady Statistic RV Percent Paced: 97.04 %
Date Time Interrogation Session: 20230520202440
Implantable Lead Implant Date: 20230214
Implantable Lead Implant Date: 20230214
Implantable Lead Location: 753859
Implantable Lead Location: 753860
Implantable Lead Model: 3830
Implantable Lead Model: 5076
Implantable Pulse Generator Implant Date: 20230214
Lead Channel Impedance Value: 285 Ohm
Lead Channel Impedance Value: 380 Ohm
Lead Channel Impedance Value: 513 Ohm
Lead Channel Impedance Value: 532 Ohm
Lead Channel Pacing Threshold Amplitude: 0.5 V
Lead Channel Pacing Threshold Amplitude: 0.5 V
Lead Channel Pacing Threshold Pulse Width: 0.4 ms
Lead Channel Pacing Threshold Pulse Width: 0.4 ms
Lead Channel Sensing Intrinsic Amplitude: 1.875 mV
Lead Channel Sensing Intrinsic Amplitude: 1.875 mV
Lead Channel Sensing Intrinsic Amplitude: 13.625 mV
Lead Channel Sensing Intrinsic Amplitude: 13.625 mV
Lead Channel Setting Pacing Amplitude: 2 V
Lead Channel Setting Pacing Amplitude: 2.25 V
Lead Channel Setting Pacing Pulse Width: 0.4 ms
Lead Channel Setting Sensing Sensitivity: 0.9 mV

## 2022-01-20 NOTE — Progress Notes (Signed)
Remote pacemaker transmission.   

## 2022-03-13 NOTE — Progress Notes (Signed)
HPI: FU CAD and atrial fibrillation. Patient underwent coronary artery bypass and graft in 2007. He had a LIMA to the LAD, SVG to D1, sequential SVG to OM1 and OM2, sequential SVG to the PDA and posterior lateral. He also had closure of PFO. Abdominal ultrasound May 2014 showed no aneurysm. Had CVA in November of 2014. Transesophageal echocardiogram November 2014 showed normal LV function, mild prolapse of the posterior mitral valve leaflet and an atrial septal aneurysm with negative bubble study. Carotid Dopplers November 2014 showed less than 40% bilateral stenosis. 30 day event monitor showed atrial fibrillation. Nuclear study May 2017 showed ejection fraction 53%.  There was diaphragmatic attenuation but no ischemia. Echocardiogram February 2023 showed ejection fraction 45 to 50%, mild left atrial enlargement.  Patient had cardioversion October 03, 2021.  Subsequently noted to have symptomatic bradycardia.  Had pacemaker placed February 2023.  He was subsequently placed on amiodarone and underwent repeat cardioversion.  Since last seen, he denies increased dyspnea, chest pain, palpitations or syncope.  Current Outpatient Medications  Medication Sig Dispense Refill   acetaminophen (TYLENOL) 500 MG tablet Take 1,000 mg by mouth every 6 (six) hours as needed (Arthritis).     carvedilol (COREG) 3.125 MG tablet TAKE 1 TABLET BY MOUTH TWICE A DAY WITH MEALS 60 tablet 10   cholecalciferol (VITAMIN D) 25 MCG (1000 UNIT) tablet Take 1,000 Units by mouth in the morning.     COD LIVER OIL PO Take 1 capsule by mouth at bedtime.      ezetimibe (ZETIA) 10 MG tablet Take 10 mg by mouth every morning.     furosemide (LASIX) 20 MG tablet Take 2 tablets (40 mg total) by mouth daily. 180 tablet 3   loratadine (CLARITIN) 10 MG tablet Take 10 mg by mouth daily as needed for allergies.     losartan (COZAAR) 100 MG tablet Take 100 mg by mouth in the morning.     Multiple Vitamin (MULTIVITAMIN WITH MINERALS) TABS  tablet Take 1 tablet by mouth in the morning.     omeprazole (PRILOSEC) 20 MG capsule Take 20 mg by mouth every other day. In the morning.     Polyethyl Glycol-Propyl Glycol (SYSTANE ULTRA OP) Place 1-2 drops into both eyes in the morning and at bedtime.     pravastatin (PRAVACHOL) 80 MG tablet Take 80 mg by mouth at bedtime.     saw palmetto 160 MG capsule Take 160 mg by mouth 2 (two) times daily.     tadalafil (CIALIS) 5 MG tablet Take 5 mg by mouth at bedtime.     TESTOSTERONE TD Place 1 mL onto the skin See admin instructions. 10% testosterone cream compounded at [facility]  - apply 1 ml topically to shoulders once every morning     XARELTO 15 MG TABS tablet TAKE ONE (1) TABLET BY MOUTH EACH DAY WITH SUPPER 90 tablet 2   No current facility-administered medications for this visit.     Past Medical History:  Diagnosis Date   At risk for sleep apnea    STOP-BANG= 5   SENT TO PCP 05-20-2014   Bilateral carotid artery stenosis    mild bilateral proximcal ICA  40% per duplex 11/ 2014   Coronary artery disease    a. s/p CABG in 2007 w/ LIMA-LAD, SVG-D1, SVG-1st & 2nd Mrg, SVG-PDA and posterior lateral   ED (erectile dysfunction)    Eye infection 12/27/2014   GERD (gastroesophageal reflux disease)    History  of embolic stroke no residual   10/ 2014  --  right PCA and MCA branch infarts (left side vision difficulties)   Hypertension    Other malaise and fatigue    Paroxysmal atrial fibrillation (HCC)    Pneumonia 10/27/2015   led to CHF, in hospital x 6 days   S/P CABG x 6    2007   Ureteral obstruction, right     Past Surgical History:  Procedure Laterality Date   APPENDECTOMY  1950's   CARDIAC CATHETERIZATION  10-31-2005  dr wall   severe three vessel/  perserved LV   CARDIOVASCULAR STRESS TEST  last one 05-19-2014  dr Jens Som   low risk lexiscan no exercise study/ small inferobasal infarct with no ischemia /  ef 53%   CARDIOVERSION N/A 10/03/2021   Procedure:  CARDIOVERSION;  Surgeon: Christell Constant, MD;  Location: [facility];  Service: Cardiovascular;  Laterality: N/A;   CARDIOVERSION N/A 11/25/2021   Procedure: CARDIOVERSION;  Surgeon: Maisie Fus, MD;  Location: [facility];  Service: Cardiovascular;  Laterality: N/A;   CARDIOVERSION N/A 12/28/2021   Procedure: CARDIOVERSION;  Surgeon: Thomasene Ripple, DO;  Location: [facility];  Service: Cardiovascular;  Laterality: N/A;   CORONARY ARTERY BYPASS GRAFT  11-01-2005  dr Cornelius Moras   CLOSURE OF PFO/  LIMA to LAD,  SVG to D1,  SVG to 1st & 2nd  MARGINAL branches, SVG  to PDA and posterior lateral   CYSTOSCOPY W/ URETERAL STENT PLACEMENT Right 05/21/2014   Procedure: CYSTOSCOPY WITH RETROGRADE PYELOGRAM/URETERAL STENT PLACEMENT;  Surgeon: Kathi Ludwig, MD;  Location: [facility];  Service: Urology;  Laterality: Right;   CYSTOSCOPY WITH URETEROSCOPY Right 05/21/2014   Procedure: CYSTOSCOPY WITH URETEROSCOPY;  Surgeon: Kathi Ludwig, MD;  Location: [facility];  Service: Urology;  Laterality: Right;   INGUINAL HERNIA REPAIR Bilateral    LAPAROSCOPIC CHOLECYSTECTOMY  10-02-2000   PACEMAKER IMPLANT N/A 10/11/2021   Procedure: PACEMAKER IMPLANT;  Surgeon: Marinus Maw, MD;  Location: [facility];  Service: Cardiovascular;  Laterality: N/A;   RIGHT URETER OBSTRUCTION SURGERY  age 24   TEE WITHOUT CARDIOVERSION N/A 07/17/2013   Procedure: TRANSESOPHAGEAL ECHOCARDIOGRAM (TEE);  Surgeon: Wendall Stade, MD;  Location: [facility] ENDOSCOPY;  Service: Cardiovascular;  Laterality: N/A;  normal LV size, mild prolapse of posterior mitral valve leaflet,  mild MR and TR, normal AV,  no LAA thrombus,  atrial septal aneurysm with no PFO/ ASD and negative bubble study, normal RV, normal aorta with no debris    Social History   Socioeconomic History   Marital status: Married    Spouse name: maxine   Number of children: 4   Years of education: college4   Highest  education level: Not on file  Occupational History   Occupation: retired  Tobacco Use   Smoking status: Former    Packs/day: 1.00    Years: 35.00    Total pack years: 35.00    Types: Cigarettes    Quit date: 08/29/1977    Years since quitting: 44.5   Smokeless tobacco: Never  Substance and Sexual Activity   Alcohol use: No    Alcohol/week: 0.0 standard drinks of alcohol   Drug use: No   Sexual activity: Not on file  Other Topics Concern   Not on file  Social History Narrative   Patient is married with 4 children.   Patient is right handed.   Patient has college education.   Patient drinks decaff  coffee.   Social Determinants of Health   Financial Resource Strain: Not on file  Food Insecurity: Not on file  Transportation Needs: Not on file  Physical Activity: Not on file  Stress: Not on file  Social Connections: Not on file  Intimate Partner Violence: Not on file    Family History  Problem Relation Age of Onset   Heart disease Mother        PPM   Hypertension Mother    Lymphoma Father    Prostate cancer Father    Prostate cancer Brother    Aneurysm Brother        REPAIR   CAD Brother        CABG   Other Brother        STENTS IN LEGS   Other Sister        STOMACH ISSUES   Depression Sister    Lung cancer Brother        Asbestosis    ROS: no fevers or chills, productive cough, hemoptysis, dysphasia, odynophagia, melena, hematochezia, dysuria, hematuria, rash, seizure activity, orthopnea, PND, pedal edema, claudication. Remaining systems are negative.  Physical Exam: Well-developed well-nourished in no acute distress.  Skin is warm and dry.  HEENT is normal.  Neck is supple.  Chest is clear to auscultation with normal expansion.  Cardiovascular exam is regular rate and rhythm.  Abdominal exam nontender or distended. No masses palpated. Extremities show trace edema. neuro grossly intact  A/P  1 permanent atrial fibrillation-amiodarone was discontinued  by Dr. Ladona Ridgel.  We will continue Xarelto.  Plan will be rate control and anticoagulation long-term.  He has had difficulties with some bleeding from a leg lesion.  He asked about watchman today.  He is seeing vascular surgery with injections to his lesion.  If this continues to be an issue we will consider referral to avoid anticoagulation long-term.  2 coronary artery disease-no chest pain.  Plan to continue medical therapy.  Continue statin.  No aspirin given need for Xarelto.  3 chronic combined systolic/diastolic congestive heart failure-patient remains euvolemic.  Continue diuretic at present dose.  4 hypertension-blood pressure controlled.  Continue present medications.  5 hyperlipidemia-continue pravastatin and Zetia.  6 pacemaker-followed by electrophysiology.  Olga Millers, MD

## 2022-03-14 ENCOUNTER — Other Ambulatory Visit: Payer: Self-pay | Admitting: Physician Assistant

## 2022-03-15 ENCOUNTER — Ambulatory Visit: Payer: PRIVATE HEALTH INSURANCE | Admitting: Family

## 2022-03-21 ENCOUNTER — Ambulatory Visit (INDEPENDENT_AMBULATORY_CARE_PROVIDER_SITE_OTHER): Payer: Medicare Other | Admitting: Family

## 2022-03-21 ENCOUNTER — Encounter: Payer: Self-pay | Admitting: Family

## 2022-03-21 DIAGNOSIS — L601 Onycholysis: Secondary | ICD-10-CM | POA: Diagnosis not present

## 2022-03-21 DIAGNOSIS — I255 Ischemic cardiomyopathy: Secondary | ICD-10-CM | POA: Diagnosis not present

## 2022-03-21 NOTE — Progress Notes (Signed)
Office Visit Note   Patient: Jose [name]           Date of Birth: [birth year]           MRN: [id number] Visit Date: 03/21/2022              Requested by: Mosetta Putt, MD [address] [place],  [place] [address] PCP: Mosetta Putt, MD  Chief Complaint  Patient presents with   Right Foot - Follow-up   Left Foot - Follow-up      HPI: The patient is an 88 year old gentleman who is seen today for bilateral foot evaluation he is unable to safely trim his own nails due to insensate neuropathy  Assessment & Plan: Visit Diagnoses: No diagnosis found.  Plan: Nails trimmed x10.  Patient tolerated well.  Follow-up in 3 months.  Follow-Up Instructions: No follow-ups on file.   Ortho Exam  Patient is alert, oriented, no adenopathy, well-dressed, normal affect, normal respiratory effort. On examination bilateral feet there is no swelling no erythema no warmth no skin breakdown no ulcers.  He does have thickened and discolored onychomycotic nails x10 these were trimmed today without incident  Imaging: No results found. No images are attached to the encounter.  Labs: Lab Results  Component Value Date   HGBA1C 6.2 (H) 07/10/2013   ESRSEDRATE 12 01/13/2016   ESRSEDRATE 20 12/21/2015   ESRSEDRATE 94 (H) 11/26/2015   REPTSTATUS 11/24/2015 FINAL 11/24/2015   REPTSTATUS 11/28/2015 FINAL 11/24/2015   GRAMSTAIN  11/24/2015    ABUNDANT WBC PRESENT,BOTH PMN AND MONONUCLEAR FEW SQUAMOUS EPITHELIAL CELLS PRESENT MODERATE GRAM POSITIVE COCCI IN PAIRS MODERATE GRAM NEGATIVE RODS Performed at [facility]    CULT  11/24/2015    NORMAL OROPHARYNGEAL FLORA Performed at [facility]      Lab Results  Component Value Date   ALBUMIN 4.0 11/08/2021   ALBUMIN 3.8 10/10/2021   ALBUMIN 4.3 09/28/2021    Lab Results  Component Value Date   MG 2.0 10/10/2021   MG 1.6 (L) 11/22/2015   No results found for: "VD25OH"  No results found for: "PREALBUMIN"     Latest Ref Rng & Units 11/25/2021   10:08 AM 10/10/2021   10:20 AM 09/28/2021    8:57 AM  CBC EXTENDED  WBC 4.0 - 10.5 K/uL  5.6  5.5   RBC 4.22 - 5.81 MIL/uL  5.50  5.52   Hemoglobin 13.0 - 17.0 g/dL 19.1  47.8  29.5   HCT 39.0 - 52.0 % 49.0  50.3  49.9   Platelets 150 - 400 K/uL  139  125   NEUT# 1.7 - 7.7 K/uL  3.7    Lymph# 0.7 - 4.0 K/uL  1.2       There is no height or weight on file to calculate BMI.  Orders:  No orders of the defined types were placed in this encounter.  No orders of the defined types were placed in this encounter.    Procedures: No procedures performed  Clinical Data: No additional findings.  ROS:  All other systems negative, except as noted in the HPI. Review of Systems  Objective: Vital Signs: There were no vitals taken for this visit.  Specialty Comments:  No specialty comments available.  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/27/2015   Elevated troponin 11/27/2015   Hydronephrosis, right 11/27/2015   SOB (shortness of breath)    Acute respiratory failure with hypoxia (HCC) 11/26/2015   Chronic kidney disease (CKD), stage III (moderate) (HCC) 11/26/2015   Acute on chronic diastolic (congestive) heart failure (HCC)    Lobar pneumonia due to unspecified organism    Acute on chronic combined systolic (congestive) and diastolic (congestive) heart failure ([place]) 11/22/2015   Atrial fibrillation ([place]) 09/10/2013   Occlusion and stenosis of vertebral artery with cerebral infarction ([place]) 07/09/2013   Visual field loss 07/09/2013   HYPERLIPIDEMIA-MIXED 12/23/2009   CAD, NATIVE VESSEL 12/15/2009   ERECTILE  DYSFUNCTION 12/10/2008   Essential hypertension 12/10/2008   FATIGUE 12/10/2008   Past Medical History:  Diagnosis Date   At risk for sleep apnea    STOP-BANG= 5   SENT TO PCP 05-20-2014   Bilateral carotid artery stenosis    mild bilateral proximcal ICA  40% per duplex 11/ 2014   Coronary artery disease    a. s/p CABG in 2007 w/ LIMA-LAD, SVG-D1, SVG-1st & 2nd Mrg, SVG-PDA and posterior lateral   ED (erectile dysfunction)    Eye infection 12/27/2014   GERD (gastroesophageal reflux disease)    History of embolic stroke no residual   11/ 2014  --  right PCA and MCA branch infarts (left side vision difficulties)   Hypertension    Other malaise and fatigue    Paroxysmal atrial fibrillation ([place])    Pneumonia 10/27/2015   led to CHF, in hospital x 6 days   S/P CABG x 6    2007   Ureteral obstruction, right     Family History  Problem Relation Age of Onset   Heart disease Mother        PPM   Hypertension Mother    Lymphoma Father    Prostate cancer Father    Prostate cancer Brother    Aneurysm Brother        REPAIR   CAD Brother        CABG   Other Brother        STENTS IN LEGS   Other Sister        STOMACH ISSUES   Depression Sister    Lung cancer Brother        Asbestosis    Past Surgical History:  Procedure Laterality Date   APPENDECTOMY  1950's   CARDIAC CATHETERIZATION  10-31-2005  dr wall   severe three vessel/  perserved LV   CARDIOVASCULAR STRESS TEST  last one 05-19-2014  dr Stanford Breed   low risk lexiscan no exercise study/ small inferobasal infarct with no ischemia /  ef 53%   CARDIOVERSION N/A 10/03/2021   Procedure: CARDIOVERSION;  Surgeon: Werner Lean, MD;  Location: [place];  Service: Cardiovascular;  Laterality: N/A;   CARDIOVERSION N/A 11/25/2021   Procedure: CARDIOVERSION;  Surgeon: Janina Mayo, MD;  Location: [facility] ENDOSCOPY;  Service: Cardiovascular;  Laterality: N/A;   CARDIOVERSION N/A 12/28/2021   Procedure: CARDIOVERSION;  Surgeon:  Berniece Salines, DO;  Location: [place];  Service: Cardiovascular;  Laterality: N/A;   CORONARY ARTERY BYPASS GRAFT  11-01-2005  dr Roxy Manns   CLOSURE OF PFO/  LIMA to LAD,  SVG to D1,  SVG to 1st & 2nd  MARGINAL branches, SVG  to PDA and posterior lateral   CYSTOSCOPY W/ URETERAL STENT  PLACEMENT Right 05/21/2014   Procedure: CYSTOSCOPY WITH RETROGRADE PYELOGRAM/URETERAL STENT PLACEMENT;  Surgeon: Kathi Ludwig, MD;  Location: [facility];  Service: Urology;  Laterality: Right;   CYSTOSCOPY WITH URETEROSCOPY Right 05/21/2014   Procedure: CYSTOSCOPY WITH URETEROSCOPY;  Surgeon: Kathi Ludwig, MD;  Location: [facility];  Service: Urology;  Laterality: Right;   INGUINAL HERNIA REPAIR Bilateral    LAPAROSCOPIC CHOLECYSTECTOMY  10-02-2000   PACEMAKER IMPLANT N/A 10/11/2021   Procedure: PACEMAKER IMPLANT;  Surgeon: Marinus Maw, MD;  Location: [facility];  Service: Cardiovascular;  Laterality: N/A;   RIGHT URETER OBSTRUCTION SURGERY  age 50   TEE WITHOUT CARDIOVERSION N/A 07/17/2013   Procedure: TRANSESOPHAGEAL ECHOCARDIOGRAM (TEE);  Surgeon: Wendall Stade, MD;  Location: [facility] ENDOSCOPY;  Service: Cardiovascular;  Laterality: N/A;  normal LV size, mild prolapse of posterior mitral valve leaflet,  mild MR and TR, normal AV,  no LAA thrombus,  atrial septal aneurysm with no PFO/ ASD and negative bubble study, normal RV, normal aorta with no debris   Social History   Occupational History   Occupation: retired  Tobacco Use   Smoking status: Former    Packs/day: 1.00    Years: 35.00    Total pack years: 35.00    Types: Cigarettes    Quit date: 08/29/1977    Years since quitting: 44.5   Smokeless tobacco: Never  Substance and Sexual Activity   Alcohol use: No    Alcohol/week: 0.0 standard drinks of alcohol   Drug use: No   Sexual activity: Not on file

## 2022-03-22 ENCOUNTER — Ambulatory Visit (INDEPENDENT_AMBULATORY_CARE_PROVIDER_SITE_OTHER): Payer: Medicare Other | Admitting: Cardiology

## 2022-03-22 ENCOUNTER — Encounter: Payer: Self-pay | Admitting: Cardiology

## 2022-03-22 VITALS — BP 110/68 | HR 74 | Ht 69.5 in | Wt 177.0 lb

## 2022-03-22 DIAGNOSIS — I480 Paroxysmal atrial fibrillation: Secondary | ICD-10-CM

## 2022-03-22 DIAGNOSIS — I5032 Chronic diastolic (congestive) heart failure: Secondary | ICD-10-CM

## 2022-03-22 DIAGNOSIS — I10 Essential (primary) hypertension: Secondary | ICD-10-CM

## 2022-03-22 DIAGNOSIS — I2510 Atherosclerotic heart disease of native coronary artery without angina pectoris: Secondary | ICD-10-CM

## 2022-03-22 DIAGNOSIS — I255 Ischemic cardiomyopathy: Secondary | ICD-10-CM

## 2022-03-22 NOTE — Patient Instructions (Signed)

## 2022-03-23 ENCOUNTER — Other Ambulatory Visit: Payer: Self-pay

## 2022-03-30 ENCOUNTER — Encounter: Payer: Self-pay | Admitting: *Deleted

## 2022-04-13 ENCOUNTER — Ambulatory Visit (INDEPENDENT_AMBULATORY_CARE_PROVIDER_SITE_OTHER): Payer: Medicare Other

## 2022-04-13 DIAGNOSIS — I480 Paroxysmal atrial fibrillation: Secondary | ICD-10-CM

## 2022-04-13 LAB — CUP PACEART REMOTE DEVICE CHECK
Battery Remaining Longevity: 154 mo
Battery Voltage: 3.13 V
Brady Statistic RA Percent Paced: 0.59 %
Brady Statistic RV Percent Paced: 94.15 %
Date Time Interrogation Session: 20230816222329
Implantable Lead Implant Date: 20230214
Implantable Lead Implant Date: 20230214
Implantable Lead Location: 753859
Implantable Lead Location: 753860
Implantable Lead Model: 3830
Implantable Lead Model: 5076
Implantable Pulse Generator Implant Date: 20230214
Lead Channel Impedance Value: 342 Ohm
Lead Channel Impedance Value: 380 Ohm
Lead Channel Impedance Value: 532 Ohm
Lead Channel Impedance Value: 589 Ohm
Lead Channel Pacing Threshold Amplitude: 0.5 V
Lead Channel Pacing Threshold Amplitude: 0.5 V
Lead Channel Pacing Threshold Pulse Width: 0.4 ms
Lead Channel Pacing Threshold Pulse Width: 0.4 ms
Lead Channel Sensing Intrinsic Amplitude: 1.625 mV
Lead Channel Sensing Intrinsic Amplitude: 1.625 mV
Lead Channel Sensing Intrinsic Amplitude: 13.625 mV
Lead Channel Sensing Intrinsic Amplitude: 13.625 mV
Lead Channel Setting Pacing Amplitude: 2 V
Lead Channel Setting Pacing Amplitude: 2 V
Lead Channel Setting Pacing Pulse Width: 0.4 ms
Lead Channel Setting Sensing Sensitivity: 0.9 mV

## 2022-05-02 NOTE — Progress Notes (Signed)
Remote pacemaker transmission.   

## 2022-06-05 ENCOUNTER — Telehealth: Payer: Self-pay | Admitting: Cardiology

## 2022-06-05 NOTE — Telephone Encounter (Signed)
Pt called to report that he is out of town at [facility] and he has been having racing HR and SOB for a few days and not feeling well... his wife fell yesterday and has multiple fractured ribs and was seen at an [facility]mediate [facility] located close to where they are staying... I asked him to go over there and be seen to see if they can get an ECG on him...he says he will go but still wanted to be sure that Dr Stanford Breed is advised. I will forward to him for his review.

## 2022-06-05 NOTE — Telephone Encounter (Signed)
Patient c/o Palpitations:  High priority if patient c/o lightheadedness, shortness of breath, or chest pain  How long have you had palpitations/irregular HR/ Afib? Are you having the symptoms now? A few days   Are you currently experiencing lightheadedness, SOB or CP? SOB for a couple of days now.   Do you have a history of afib (atrial fibrillation) or irregular heart rhythm? Yes   Have you checked your BP or HR? (document readings if available): No  Are you experiencing any other symptoms? No

## 2022-06-05 NOTE — Telephone Encounter (Signed)
Dr. Riccardo Dubin calling asking to speak with Dr. Stanford Breed or DOD.

## 2022-06-07 NOTE — Telephone Encounter (Signed)
Spoke with ER doc regarding Jose [name] (as the DOD) - provided recommendations including delta troponin rule out- labs showed elevated BNP and trop, but they are similar to prior elevations. They did not feel patient needed admission. Would arrange follow-up.  -[place]

## 2022-06-09 NOTE — Telephone Encounter (Signed)
Spoke with pt, Follow up scheduled  

## 2022-06-14 NOTE — Progress Notes (Signed)
HPI: FU CAD and atrial fibrillation. Patient underwent coronary artery bypass and graft in 2007. He had a LIMA to the LAD, SVG to D1, sequential SVG to OM1 and OM2, sequential SVG to the PDA and posterior lateral. He also had closure of PFO. Abdominal ultrasound May 2014 showed no aneurysm. Had CVA in November of 2014. Transesophageal echocardiogram November 2014 showed normal LV function, mild prolapse of the posterior mitral valve leaflet and an atrial septal aneurysm with negative bubble study. Carotid Dopplers November 2014 showed less than 40% bilateral stenosis. 30 day event monitor showed atrial fibrillation. Nuclear study May 2017 showed ejection fraction 53%.  There was diaphragmatic attenuation but no ischemia. Echocardiogram February 2023 showed ejection fraction 45 to 50%, mild left atrial enlargement.  Patient had cardioversion October 03, 2021.  Subsequently noted to have symptomatic bradycardia.  Had pacemaker placed February 2023.  He was subsequently placed on amiodarone and underwent repeat cardioversion.  Patient seen in October 2023 at [facility] with palpitations and dyspnea.  BNP elevated at 646.  Troponins normal.  Since last seen, patient notes that over the past 2 months he has had increased fatigue, dyspnea on exertion, bilateral lower extremity edema and orthopnea.  No chest pain, palpitations or syncope.  He does have palpitations when he lays down at night.  Current Outpatient Medications  Medication Sig Dispense Refill   acetaminophen (TYLENOL) 500 MG tablet Take 1,000 mg by mouth every 6 (six) hours as needed (Arthritis).     carvedilol (COREG) 3.125 MG tablet TAKE 1 TABLET BY MOUTH TWICE A DAY WITH MEALS 60 tablet 10   cholecalciferol (VITAMIN D) 25 MCG (1000 UNIT) tablet Take 1,000 Units by mouth in the morning.     COD LIVER OIL PO Take 1 capsule by mouth at bedtime.      ezetimibe (ZETIA) 10 MG tablet Take 10 mg by mouth every morning.     furosemide  (LASIX) 20 MG tablet Take 40 mg by mouth daily.     loratadine (CLARITIN) 10 MG tablet Take 10 mg by mouth daily as needed for allergies.     losartan (COZAAR) 100 MG tablet Take 100 mg by mouth in the morning.     Multiple Vitamin (MULTIVITAMIN WITH MINERALS) TABS tablet Take 1 tablet by mouth in the morning.     omeprazole (PRILOSEC) 20 MG capsule Take 20 mg by mouth every other day. In the morning.     Polyethyl Glycol-Propyl Glycol (SYSTANE ULTRA OP) Place 1-2 drops into both eyes in the morning and at bedtime.     pravastatin (PRAVACHOL) 80 MG tablet Take 80 mg by mouth at bedtime.     saw palmetto 160 MG capsule Take 160 mg by mouth 2 (two) times daily.     tadalafil (CIALIS) 5 MG tablet Take 5 mg by mouth at bedtime.     TESTOSTERONE TD Place 1 mL onto the skin See admin instructions. 10% testosterone cream compounded at [facility]  - apply 1 ml topically to shoulders once every morning     XARELTO 15 MG TABS tablet TAKE ONE (1) TABLET BY MOUTH EACH DAY WITH SUPPER 90 tablet 2   No current facility-administered medications for this visit.     Past Medical History:  Diagnosis Date   At risk for sleep apnea    STOP-BANG= 5   SENT TO PCP 05-20-2014   Bilateral carotid artery stenosis    mild bilateral proximcal ICA  40%  per duplex 11/ 2014   Coronary artery disease    a. s/p CABG in 2007 w/ LIMA-LAD, SVG-D1, SVG-1st & 2nd Mrg, SVG-PDA and posterior lateral   ED (erectile dysfunction)    Eye infection 12/27/2014   GERD (gastroesophageal reflux disease)    History of embolic stroke no residual   11/ 2014  --  right PCA and MCA branch infarts (left side vision difficulties)   Hypertension    Other malaise and fatigue    Paroxysmal atrial fibrillation ([place])    Pneumonia 10/27/2015   led to CHF, in hospital x 6 days   S/P CABG x 6    2007   Ureteral obstruction, right     Past Surgical History:  Procedure Laterality Date   APPENDECTOMY  1950's   CARDIAC  CATHETERIZATION  10-31-2005  dr wall   severe three vessel/  perserved LV   CARDIOVASCULAR STRESS TEST  last one 05-19-2014  dr Stanford Breed   low risk lexiscan no exercise study/ small inferobasal infarct with no ischemia /  ef 53%   CARDIOVERSION N/A 10/03/2021   Procedure: CARDIOVERSION;  Surgeon: Werner Lean, MD;  Location: [place] ENDOSCOPY;  Service: Cardiovascular;  Laterality: N/A;   CARDIOVERSION N/A 11/25/2021   Procedure: CARDIOVERSION;  Surgeon: Janina Mayo, MD;  Location: [facility] ENDOSCOPY;  Service: Cardiovascular;  Laterality: N/A;   CARDIOVERSION N/A 12/28/2021   Procedure: CARDIOVERSION;  Surgeon: Berniece Salines, DO;  Location: [place];  Service: Cardiovascular;  Laterality: N/A;   CORONARY ARTERY BYPASS GRAFT  11-01-2005  dr Roxy Manns   CLOSURE OF PFO/  LIMA to LAD,  SVG to D1,  SVG to 1st & 2nd  MARGINAL branches, SVG  to PDA and posterior lateral   CYSTOSCOPY W/ URETERAL STENT PLACEMENT Right 05/21/2014   Procedure: CYSTOSCOPY WITH RETROGRADE PYELOGRAM/URETERAL STENT PLACEMENT;  Surgeon: Ailene Rud, MD;  Location: [place];  Service: Urology;  Laterality: Right;   CYSTOSCOPY WITH URETEROSCOPY Right 05/21/2014   Procedure: CYSTOSCOPY WITH URETEROSCOPY;  Surgeon: Ailene Rud, MD;  [facility];  Service: Urology;  Laterality: Right;   INGUINAL HERNIA REPAIR Bilateral    LAPAROSCOPIC CHOLECYSTECTOMY  10-02-2000   PACEMAKER IMPLANT N/A 10/11/2021   Procedure: PACEMAKER IMPLANT;  Surgeon: Evans Lance, MD;  Location: [place] CV LAB;  Service: Cardiovascular;  Laterality: N/A;   RIGHT URETER OBSTRUCTION SURGERY  age 48   TEE WITHOUT CARDIOVERSION N/A 07/17/2013   Procedure: TRANSESOPHAGEAL ECHOCARDIOGRAM (TEE);  Surgeon: Josue Hector, MD;  Location: [facility] ENDOSCOPY;  Service: Cardiovascular;  Laterality: N/A;  normal LV size, mild prolapse of posterior mitral valve leaflet,  mild MR and TR, normal AV,  no LAA thrombus,   atrial septal aneurysm with no PFO/ ASD and negative bubble study, normal RV, normal aorta with no debris    Social History   Socioeconomic History   Marital status: Married    Spouse name: maxine   Number of children: 4   Years of education: college4   Highest education level: Not on file  Occupational History   Occupation: retired  Tobacco Use   Smoking status: Former    Packs/day: 1.00    Years: 35.00    Total pack years: 35.00    Types: Cigarettes    Quit date: 08/29/1977    Years since quitting: 44.8   Smokeless tobacco: Never  Substance and Sexual Activity   Alcohol use: No    Alcohol/week: 0.0 standard drinks of alcohol  Drug use: No   Sexual activity: Not on file  Other Topics Concern   Not on file  Social History Narrative   Patient is married with 4 children.   Patient is right handed.   Patient has college education.   Patient drinks decaff coffee.   Social Determinants of Health   Financial Resource Strain: Not on file  Food Insecurity: Not on file  Transportation Needs: Not on file  Physical Activity: Not on file  Stress: Not on file  Social Connections: Not on file  Intimate Partner Violence: Not on file    Family History  Problem Relation Age of Onset   Heart disease Mother        PPM   Hypertension Mother    Lymphoma Father    Prostate cancer Father    Prostate cancer Brother    Aneurysm Brother        REPAIR   CAD Brother        CABG   Other Brother        STENTS IN LEGS   Other Sister        STOMACH ISSUES   Depression Sister    Lung cancer Brother        Asbestosis    ROS: no fevers or chills, productive cough, hemoptysis, dysphasia, odynophagia, melena, hematochezia, dysuria, hematuria, rash, seizure activity, orthopnea, PND, pedal edema, claudication. Remaining systems are negative.  Physical Exam: Well-developed well-nourished in no acute distress.  Skin is warm and dry.  HEENT is normal.  Neck is supple.  Chest is clear  to auscultation with normal expansion.  Cardiovascular exam is regular rate and rhythm.  Abdominal exam nontender or distended. No masses palpated. Extremities show 1+ edema. neuro grossly intact  ECG-ventricular pacing with underlying atrial fibrillation and occasional PVC.  Personally reviewed  A/P  1 persistent atrial fibrillation-patient remains in atrial fibrillation today.  Amiodarone was added in the past and after 30 days of therapy cardioversion was performed successfully.  However atrial fibrillation recurred and patient was felt to be asymptomatic.  Amiodarone was therefore discontinued and rate control and anticoagulation was planned.  However more recently he has developed fatigue and CHF symptoms.  I think atrial fibrillation could be contributing.  He would like to discuss potential ablation and I will arrange for him to be seen by one of our electrophysiologist for this procedure.  If he is felt not to be a candidate due to age then it may be worthwhile to reload amiodarone and repeat cardioversion attempt.  Continue Xarelto.  Increase carvedilol to 6.25 mg twice daily.  Note he would not be a candidate for a 1C agent given his history of coronary disease and Tikosyn not an option due to baseline renal insufficiency.  I will plan to repeat echocardiogram given his CHF symptoms.  2 dyspnea-recently seen at [facility] with dyspnea.  BNP elevated.  Likely secondary to CHF.  Increase Lasix to 60 mg daily.  Check potassium and renal function in 1 week.  Repeat echocardiogram.  3 chronic combined systolic/diastolic congestive heart failure-plan as outlined under #2.  4 coronary artery disease-he denies chest pain.  Continue statin.  He is not on aspirin given need for anticoagulation.  5 hypertension-blood pressure controlled.  Continue present medical regimen.  6 hyperlipidemia-continue present medical regimen including pravastatin and Zetia.  7 history of pacemaker-managed by  electrophysiology.  Kirk Ruths, MD

## 2022-06-21 ENCOUNTER — Ambulatory Visit: Payer: PRIVATE HEALTH INSURANCE | Admitting: Family

## 2022-06-28 ENCOUNTER — Encounter: Payer: Self-pay | Admitting: Cardiology

## 2022-06-28 ENCOUNTER — Ambulatory Visit: Payer: PRIVATE HEALTH INSURANCE | Admitting: Family

## 2022-06-28 ENCOUNTER — Ambulatory Visit: Payer: Medicare Other | Attending: Cardiology | Admitting: Cardiology

## 2022-06-28 VITALS — BP 140/80 | HR 83 | Ht 69.5 in | Wt 176.1 lb

## 2022-06-28 DIAGNOSIS — Z950 Presence of cardiac pacemaker: Secondary | ICD-10-CM | POA: Diagnosis present

## 2022-06-28 DIAGNOSIS — I255 Ischemic cardiomyopathy: Secondary | ICD-10-CM | POA: Insufficient documentation

## 2022-06-28 DIAGNOSIS — I2510 Atherosclerotic heart disease of native coronary artery without angina pectoris: Secondary | ICD-10-CM | POA: Insufficient documentation

## 2022-06-28 DIAGNOSIS — I10 Essential (primary) hypertension: Secondary | ICD-10-CM | POA: Insufficient documentation

## 2022-06-28 DIAGNOSIS — I5032 Chronic diastolic (congestive) heart failure: Secondary | ICD-10-CM | POA: Diagnosis present

## 2022-06-28 DIAGNOSIS — I480 Paroxysmal atrial fibrillation: Secondary | ICD-10-CM | POA: Insufficient documentation

## 2022-06-28 MED ORDER — FUROSEMIDE 20 MG PO TABS: 60.0000 mg | ORAL_TABLET | Freq: Every day | ORAL | 3 refills | Status: DC

## 2022-06-28 NOTE — Patient Instructions (Signed)
Medication Instructions:   INCREASE FUROSEMIDE TO 60 MG ONCE DAILY= 3 OF THE 20 MG TABLETS ONCE DAILY  *If you need a refill on your cardiac medications before your next appointment, please call your pharmacy*   Lab Work:  Your physician recommends that you return for lab work in: ONE WEEK-DO NOT NEED TO FAST  [place] on the 2 nd floor in [address] Hours- Mon-Thur 8 am-11:30 am and 1 pm - 4:30 pm             Fri-8am - 11 am  If you have labs (blood work) drawn today and your tests are completely normal, you will receive your results only by: [place] (if you have MyChart) OR A paper copy in the mail If you have any lab test that is abnormal or we need to change your treatment, we will call you to review the results.   Follow-Up: At [facility], you and your health needs are our priority.  As part of our continuing mission to provide you with exceptional heart care, we have created designated Provider Care Teams.  These Care Teams include your primary Cardiologist (physician) and Advanced Practice Providers (APPs -  Physician Assistants and Nurse Practitioners) who all work together to provide you with the care you need, when you need it.  We recommend signing up for the patient portal called "MyChart".  Sign up information is provided on this After Visit Summary.  MyChart is used to connect with patients for Virtual Visits (Telemedicine).  Patients are able to view lab/test results, encounter notes, upcoming appointments, etc.  Non-urgent messages can be sent to your provider as well.   To learn more about what you can do with MyChart, go to [email or web address].    Your next appointment:   6 week(s)  The format for your next appointment:   In Person  Provider:   Kirk Ruths, MD

## 2022-07-03 ENCOUNTER — Ambulatory Visit (INDEPENDENT_AMBULATORY_CARE_PROVIDER_SITE_OTHER): Payer: Medicare Other | Admitting: Orthopedic Surgery

## 2022-07-03 DIAGNOSIS — L601 Onycholysis: Secondary | ICD-10-CM | POA: Diagnosis not present

## 2022-07-03 DIAGNOSIS — I255 Ischemic cardiomyopathy: Secondary | ICD-10-CM

## 2022-07-04 ENCOUNTER — Encounter: Payer: Self-pay | Admitting: Orthopedic Surgery

## 2022-07-04 NOTE — Progress Notes (Signed)
Office Visit Note   Patient: Jose [name]           Date of Birth: 07/17/1931           MRN: [id number] Visit Date: 07/03/2022              Requested by: Derinda Late, MD [address] [place],  [place] [address] PCP: Derinda Late, MD  Chief Complaint  Patient presents with   Right Foot - Follow-up    Nail trim   Left Foot - Follow-up    Nail trim      HPI: Patient is an 88 year old gentleman who presents in follow-up for both feet.  Patient complains of painful onychomycotic nails x10 that he is unable to safely trim on his own.  Assessment & Plan: Visit Diagnoses:  1. Onycholysis of toenail     Plan: Nails were trimmed Q000111Q without complications.  Follow-Up Instructions: Return in about 3 months (around 10/03/2022).   Ortho Exam  Patient is alert, oriented, no adenopathy, well-dressed, normal affect, normal respiratory effort. Examination patient has some plantar calluses on the left foot these were pared no open ulcers.  No plantar calluses on the right foot.  Patient does have thickened discolored onychomycotic nails x10 there were no paronychial infection.  The nails were trimmed Q000111Q without complication.  Imaging: No results found. No images are attached to the encounter.  Labs: Lab Results  Component Value Date   HGBA1C 6.2 (H) 07/10/2013   ESRSEDRATE 12 01/13/2016   ESRSEDRATE 20 12/21/2015   ESRSEDRATE 94 (H) 11/26/2015   REPTSTATUS 11/24/2015 FINAL 11/24/2015   REPTSTATUS 11/28/2015 FINAL 11/24/2015   GRAMSTAIN  11/24/2015    ABUNDANT WBC PRESENT,BOTH PMN AND MONONUCLEAR FEW SQUAMOUS EPITHELIAL CELLS PRESENT MODERATE GRAM POSITIVE COCCI IN PAIRS MODERATE GRAM NEGATIVE RODS Performed at [facility]  11/24/2015    NORMAL OROPHARYNGEAL FLORA Performed at [facility]      Lab Results  Component Value Date   ALBUMIN 4.0 11/08/2021   ALBUMIN 3.8 10/10/2021   ALBUMIN 4.3 09/28/2021    Lab Results  Component  Value Date   MG 2.0 10/10/2021   MG 1.6 (L) 11/22/2015   No results found for: "VD25OH"  No results found for: "PREALBUMIN"    Latest Ref Rng & Units 11/25/2021   10:08 AM 10/10/2021   10:20 AM 09/28/2021    8:57 AM  CBC EXTENDED  WBC 4.0 - 10.5 K/uL  5.6  5.5   RBC 4.22 - 5.81 MIL/uL  5.50  5.52   Hemoglobin 13.0 - 17.0 g/dL 16.7  16.7  16.5   HCT 39.0 - 52.0 % 49.0  50.3  49.9   Platelets 150 - 400 K/uL  139  125   NEUT# 1.7 - 7.7 K/uL  3.7    Lymph# 0.7 - 4.0 K/uL  1.2       There is no height or weight on file to calculate BMI.  Orders:  No orders of the defined types were placed in this encounter.  No orders of the defined types were placed in this encounter.    Procedures: No procedures performed  Clinical Data: No additional findings.  ROS:  All other systems negative, except as noted in the HPI. Review of Systems  Objective: Vital Signs: There were no vitals taken for this visit.  Specialty Comments:  No specialty comments available.  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   Elevated troponin 11/27/2015   Hydronephrosis, right 11/27/2015   SOB (shortness of breath)    Acute respiratory failure with hypoxia (HCC) 11/26/2015   Chronic kidney disease (CKD), stage III (moderate) (HCC) 11/26/2015   Acute on chronic diastolic (congestive) heart failure (HCC)    Lobar pneumonia due to unspecified organism    Acute on chronic combined systolic (congestive) and diastolic (congestive) heart failure ([place]) 11/22/2015   Atrial fibrillation ([place]) 09/10/2013   Occlusion and stenosis of vertebral artery with cerebral infarction  ([place]) 07/09/2013   Visual field loss 07/09/2013   HYPERLIPIDEMIA-MIXED 12/23/2009   CAD, NATIVE VESSEL 12/15/2009   ERECTILE DYSFUNCTION 12/10/2008   Essential hypertension 12/10/2008   FATIGUE 12/10/2008   Past Medical History:  Diagnosis Date   At risk for sleep apnea    STOP-BANG= 5   SENT TO PCP 05-20-2014   Bilateral carotid artery stenosis    mild bilateral proximcal ICA  40% per duplex 11/ 2014   Coronary artery disease    a. s/p CABG in 2007 w/ LIMA-LAD, SVG-D1, SVG-1st & 2nd Mrg, SVG-PDA and posterior lateral   ED (erectile dysfunction)    Eye infection 12/27/2014   GERD (gastroesophageal reflux disease)    History of embolic stroke no residual   11/ 2014  --  right PCA and MCA branch infarts (left side vision difficulties)   Hypertension    Other malaise and fatigue    Paroxysmal atrial fibrillation ([place])    Pneumonia 10/27/2015   led to CHF, in hospital x 6 days   S/P CABG x 6    2007   Ureteral obstruction, right     Family History  Problem Relation Age of Onset   Heart disease Mother        PPM   Hypertension Mother    Lymphoma Father    Prostate cancer Father    Prostate cancer Brother    Aneurysm Brother        REPAIR   CAD Brother        CABG   Other Brother        STENTS IN LEGS   Other Sister        STOMACH ISSUES   Depression Sister    Lung cancer Brother        Asbestosis    Past Surgical History:  Procedure Laterality Date   APPENDECTOMY  1950's   CARDIAC CATHETERIZATION  10-31-2005  dr wall   severe three vessel/  perserved LV   CARDIOVASCULAR STRESS TEST  last one 05-19-2014  dr Stanford Breed   low risk lexiscan no exercise study/ small inferobasal infarct with no ischemia /  ef 53%   CARDIOVERSION N/A 10/03/2021   Procedure: CARDIOVERSION;  Surgeon: Werner Lean, MD;  Location: [place] ENDOSCOPY;  Service: Cardiovascular;  Laterality: N/A;   CARDIOVERSION N/A 11/25/2021   Procedure: CARDIOVERSION;  Surgeon: Janina Mayo, MD;   Location: [facility] ENDOSCOPY;  Service: Cardiovascular;  Laterality: N/A;   CARDIOVERSION N/A 12/28/2021   Procedure: CARDIOVERSION;  Surgeon: Berniece Salines, DO;  Location: [place];  Service: Cardiovascular;  Laterality: N/A;   CORONARY ARTERY BYPASS GRAFT  11-01-2005  dr Roxy Manns   CLOSURE OF PFO/  LIMA to LAD,  SVG to D1,  SVG to 1st & 2nd  MARGINAL branches, SVG  to PDA and posterior lateral   CYSTOSCOPY W/ URETERAL STENT PLACEMENT Right 05/21/2014   Procedure: CYSTOSCOPY WITH RETROGRADE PYELOGRAM/URETERAL STENT PLACEMENT;  Surgeon: Ailene Rud, MD;  Location: [facility];  Service: Urology;  Laterality: Right;   CYSTOSCOPY WITH URETEROSCOPY Right 05/21/2014   Procedure: CYSTOSCOPY WITH URETEROSCOPY;  Surgeon: Ailene Rud, MD;  Location: [facility];  Service: Urology;  Laterality: Right;   INGUINAL HERNIA REPAIR Bilateral    LAPAROSCOPIC CHOLECYSTECTOMY  10-02-2000   PACEMAKER IMPLANT N/A 10/11/2021   Procedure: PACEMAKER IMPLANT;  Surgeon: Evans Lance, MD;  Location: [place] CV LAB;  Service: Cardiovascular;  Laterality: N/A;   RIGHT URETER OBSTRUCTION SURGERY  age 18   TEE WITHOUT CARDIOVERSION N/A 07/17/2013   Procedure: TRANSESOPHAGEAL ECHOCARDIOGRAM (TEE);  Surgeon: Josue Hector, MD;  Location: [facility] ENDOSCOPY;  Service: Cardiovascular;  Laterality: N/A;  normal LV size, mild prolapse of posterior mitral valve leaflet,  mild MR and TR, normal AV,  no LAA thrombus,  atrial septal aneurysm with no PFO/ ASD and negative bubble study, normal RV, normal aorta with no debris   Social History   Occupational History   Occupation: retired  Tobacco Use   Smoking status: Former    Packs/day: 1.00    Years: 35.00    Total pack years: 35.00    Types: Cigarettes    Quit date: 08/29/1977    Years since quitting: 44.8   Smokeless tobacco: Never  Substance and Sexual Activity   Alcohol use: No    Alcohol/week: 0.0 standard drinks of alcohol   Drug  use: No   Sexual activity: Not on file

## 2022-07-06 LAB — BASIC METABOLIC PANEL
BUN/Creatinine Ratio: 14 (ref 10–24)
BUN: 27 mg/dL (ref 8–27)
CO2: 21 mmol/L (ref 20–29)
Calcium: 9.4 mg/dL (ref 8.6–10.2)
Chloride: 100 mmol/L (ref 96–106)
Creatinine, Ser: 1.89 mg/dL — ABNORMAL HIGH (ref 0.76–1.27)
Glucose: 108 mg/dL — ABNORMAL HIGH (ref 70–99)
Potassium: 3.9 mmol/L (ref 3.5–5.2)
Sodium: 139 mmol/L (ref 134–144)
eGFR: 34 mL/min/{1.73_m2} — ABNORMAL LOW (ref >59)

## 2022-07-13 ENCOUNTER — Ambulatory Visit (INDEPENDENT_AMBULATORY_CARE_PROVIDER_SITE_OTHER): Payer: Medicare Other

## 2022-07-13 DIAGNOSIS — I255 Ischemic cardiomyopathy: Secondary | ICD-10-CM | POA: Diagnosis not present

## 2022-07-13 LAB — CUP PACEART REMOTE DEVICE CHECK
Battery Remaining Longevity: 151 mo
Battery Voltage: 3.08 V
Brady Statistic RA Percent Paced: 1.06 %
Brady Statistic RV Percent Paced: 88.95 %
Date Time Interrogation Session: 20231116065405
Implantable Lead Connection Status: 753985
Implantable Lead Connection Status: 753985
Implantable Lead Implant Date: 20230214
Implantable Lead Implant Date: 20230214
Implantable Lead Location: 753859
Implantable Lead Location: 753860
Implantable Lead Model: 3830
Implantable Lead Model: 5076
Implantable Pulse Generator Implant Date: 20230214
Lead Channel Impedance Value: 285 Ohm
Lead Channel Impedance Value: 380 Ohm
Lead Channel Impedance Value: 513 Ohm
Lead Channel Impedance Value: 513 Ohm
Lead Channel Pacing Threshold Amplitude: 0.5 V
Lead Channel Pacing Threshold Amplitude: 0.625 V
Lead Channel Pacing Threshold Pulse Width: 0.4 ms
Lead Channel Pacing Threshold Pulse Width: 0.4 ms
Lead Channel Sensing Intrinsic Amplitude: 0.75 mV
Lead Channel Sensing Intrinsic Amplitude: 0.75 mV
Lead Channel Sensing Intrinsic Amplitude: 14.875 mV
Lead Channel Sensing Intrinsic Amplitude: 14.875 mV
Lead Channel Setting Pacing Amplitude: 2 V
Lead Channel Setting Pacing Amplitude: 2 V
Lead Channel Setting Pacing Pulse Width: 0.4 ms
Lead Channel Setting Sensing Sensitivity: 0.9 mV
Zone Setting Status: 755011
Zone Setting Status: 755011

## 2022-07-17 ENCOUNTER — Ambulatory Visit: Payer: Medicare Other | Attending: Cardiovascular Disease | Admitting: Cardiovascular Disease

## 2022-07-17 ENCOUNTER — Encounter: Payer: Self-pay | Admitting: Cardiovascular Disease

## 2022-07-17 VITALS — BP 132/84 | HR 77 | Ht 69.5 in | Wt 174.4 lb

## 2022-07-17 DIAGNOSIS — I480 Paroxysmal atrial fibrillation: Secondary | ICD-10-CM | POA: Insufficient documentation

## 2022-07-17 MED ORDER — AMIODARONE HCL 200 MG PO TABS: 200.0000 mg | ORAL_TABLET | Freq: Two times a day (BID) | ORAL | 3 refills | Status: DC

## 2022-07-17 NOTE — Patient Instructions (Signed)
Medication Instructions:  Your physician has recommended you make the following change in your medication:  1) START taking amiodarone 200 mg twice daily *If you need a refill on your cardiac medications before your next appointment, please call your pharmacy*  Follow-Up: At [facility], you and your health needs are our priority.  As part of our continuing mission to provide you with exceptional heart care, we have created designated Provider Care Teams.  These Care Teams include your primary Cardiologist (physician) and Advanced Practice Providers (APPs -  Physician Assistants and Nurse Practitioners) who all work together to provide you with the care you need, when you need it.  Your next appointment:   Follow up with Dr. Jens Som as scheduled.  Important Information About Sugar

## 2022-07-17 NOTE — Progress Notes (Signed)
Electrophysiology Office Note:    Date:  07/17/2022   ID:  Jose [name], DOB 12-Apr-1931, MRN [id number]  PCP:  Derinda Late, MD   [facility] Cardiologist:  Kirk Ruths, MD Electrophysiologist:  Melida Quitter, MD     Referring MD: Derinda Late, MD   Chief complaint: bleeding from veins.  History of Present Illness:    Jose [name] is a 88 y.o. male with a hx of CAD and atrial fibrillation referred for arrhythmia management. He had a CABG in 2007 with closure of a PFO. He had a cardioversion for AF in Feb, 2023. A pacemaker was placed later in the month due to symptomatic bradycardia. AF recurred. Amiodarone was started and another cardioversion performed. Despite amiodarone, he had recurrence of AF but he was thought to be asymptomatic. Amiodarone was discontinued, and a rate control approach pursued. He was seen by Dr. Stanford Breed early this month and at that time complained of 2 months of fatigue, dyspnea on exertion, orthopnea, and lower extremity swelling.  His primary complaint to me is that he has had several vigorous bleeds from his varicose veins. He has since had several sclerosis procedures and has not had any bleeding since. He reports that he would like to discontinue his anticoagulation. He reports that his energy levels and breathing are at baseline. He does not have palpitations.  Past Medical History:  Diagnosis Date   At risk for sleep apnea    STOP-BANG= 5   SENT TO PCP 05-20-2014   Bilateral carotid artery stenosis    mild bilateral proximcal ICA  40% per duplex 11/ 2014   Coronary artery disease    a. s/p CABG in 2007 w/ LIMA-LAD, SVG-D1, SVG-1st & 2nd Mrg, SVG-PDA and posterior lateral   ED (erectile dysfunction)    Eye infection 12/27/2014   GERD (gastroesophageal reflux disease)    History of embolic stroke no residual   11/ 2014  --  right PCA and MCA branch infarts (left side vision difficulties)   Hypertension     Other malaise and fatigue    Paroxysmal atrial fibrillation ([place])    Pneumonia 10/27/2015   led to CHF, in hospital x 6 days   S/P CABG x 6    2007   Ureteral obstruction, right     Past Surgical History:  Procedure Laterality Date   APPENDECTOMY  1950's   CARDIAC CATHETERIZATION  10-31-2005  dr wall   severe three vessel/  perserved LV   CARDIOVASCULAR STRESS TEST  last one 05-19-2014  dr Stanford Breed   low risk lexiscan no exercise study/ small inferobasal infarct with no ischemia /  ef 53%   CARDIOVERSION N/A 10/03/2021   Procedure: CARDIOVERSION;  Surgeon: Werner Lean, MD;  Location: [facility];  Service: Cardiovascular;  Laterality: N/A;   CARDIOVERSION N/A 11/25/2021   Procedure: CARDIOVERSION;  Surgeon: Janina Mayo, MD;  Location: [facility];  Service: Cardiovascular;  Laterality: N/A;   CARDIOVERSION N/A 12/28/2021   Procedure: CARDIOVERSION;  Surgeon: Berniece Salines, DO;  Location: [place];  Service: Cardiovascular;  Laterality: N/A;   CORONARY ARTERY BYPASS GRAFT  11-01-2005  dr Roxy Manns   CLOSURE OF PFO/  LIMA to LAD,  SVG to D1,  SVG to 1st & 2nd  MARGINAL branches, SVG  to PDA and posterior lateral   CYSTOSCOPY W/ URETERAL STENT PLACEMENT Right 05/21/2014   Procedure: CYSTOSCOPY WITH RETROGRADE PYELOGRAM/URETERAL STENT PLACEMENT;  Surgeon: Ailene Rud, MD;  Location: [facility];  Service: Urology;  Laterality: Right;   CYSTOSCOPY WITH URETEROSCOPY Right 05/21/2014   Procedure: CYSTOSCOPY WITH URETEROSCOPY;  Surgeon: Ailene Rud, MD;  Location: [facility];  Service: Urology;  Laterality: Right;   INGUINAL HERNIA REPAIR Bilateral    LAPAROSCOPIC CHOLECYSTECTOMY  10-02-2000   PACEMAKER IMPLANT N/A 10/11/2021   Procedure: PACEMAKER IMPLANT;  Surgeon: Evans Lance, MD;  Location: [place] [facility];  Service: Cardiovascular;  Laterality: N/A;   RIGHT URETER OBSTRUCTION SURGERY  age 26   TEE WITHOUT CARDIOVERSION N/A  07/17/2013   Procedure: TRANSESOPHAGEAL ECHOCARDIOGRAM (TEE);  Surgeon: Josue Hector, MD;  Location: [facility];  Service: Cardiovascular;  Laterality: N/A;  normal LV size, mild prolapse of posterior mitral valve leaflet,  mild MR and TR, normal AV,  no LAA thrombus,  atrial septal aneurysm with no PFO/ ASD and negative bubble study, normal RV, normal aorta with no debris    Current Medications: Current Meds  Medication Sig   acetaminophen (TYLENOL) 500 MG tablet Take 1,000 mg by mouth every 6 (six) hours as needed (Arthritis).   carvedilol (COREG) 3.125 MG tablet TAKE 1 TABLET BY MOUTH TWICE A DAY WITH MEALS   cholecalciferol (VITAMIN D) 25 MCG (1000 UNIT) tablet Take 1,000 Units by mouth in the morning.   COD LIVER OIL PO Take 1 capsule by mouth at bedtime.    ezetimibe (ZETIA) 10 MG tablet Take 10 mg by mouth every morning.   furosemide (LASIX) 20 MG tablet Take 3 tablets (60 mg total) by mouth daily.   loratadine (CLARITIN) 10 MG tablet Take 10 mg by mouth daily as needed for allergies.   losartan (COZAAR) 100 MG tablet Take 100 mg by mouth in the morning.   Multiple Vitamin (MULTIVITAMIN WITH MINERALS) TABS tablet Take 1 tablet by mouth in the morning.   omeprazole (PRILOSEC) 20 MG capsule Take 20 mg by mouth every other day. In the morning.   Polyethyl Glycol-Propyl Glycol (SYSTANE ULTRA OP) Place 1-2 drops into both eyes in the morning and at bedtime.   pravastatin (PRAVACHOL) 80 MG tablet Take 80 mg by mouth at bedtime.   saw palmetto 160 MG capsule Take 160 mg by mouth 2 (two) times daily.   tadalafil (CIALIS) 5 MG tablet Take 5 mg by mouth at bedtime.   TESTOSTERONE TD Place 1 mL onto the skin See admin instructions. 10% testosterone cream compounded at [place]  - apply 1 ml topically to shoulders once every morning   XARELTO 15 MG TABS tablet TAKE ONE (1) TABLET BY MOUTH EACH DAY WITH SUPPER     Allergies:   Cephalexin, Iodinated contrast media, Iohexol, Ace  inhibitors, Metoprolol, Viagra [sildenafil citrate], Ciprofloxacin, Codeine, and Sulfa antibiotics   Social History   Socioeconomic History   Marital status: Married    Spouse name: maxine   Number of children: 4   Years of education: college4   Highest education level: Not on file  Occupational History   Occupation: retired  Tobacco Use   Smoking status: Former    Packs/day: 1.00    Years: 35.00    Total pack years: 35.00    Types: Cigarettes    Quit date: 08/29/1977    Years since quitting: 44.9   Smokeless tobacco: Never  Substance and Sexual Activity   Alcohol use: No    Alcohol/week: 0.0 standard drinks of alcohol   Drug use: No   Sexual activity: Not on file  Other Topics  Concern   Not on file  Social History Narrative   Patient is married with 4 children.   Patient is right handed.   Patient has college education.   Patient drinks decaff coffee.   Social Determinants of Health   Financial Resource Strain: Not on file  Food Insecurity: Not on file  Transportation Needs: Not on file  Physical Activity: Not on file  Stress: Not on file  Social Connections: Not on file     Family History: The patient's family history includes Aneurysm in his brother; CAD in his brother; Depression in his sister; Heart disease in his mother; Hypertension in his mother; Lung cancer in his brother; Lymphoma in his father; Other in his brother and sister; Prostate cancer in his brother and father.  ROS:   Please see the history of present illness.    All other systems reviewed and are negative.  EKGs/Labs/Other Studies Reviewed Today:    EKG:  Last EKG results: AF. V-paced   Recent Labs: 10/10/2021: B Natriuretic Peptide 307.6; Magnesium 2.0; Platelets 139 11/08/2021: ALT 13; TSH 1.510 11/25/2021: Hemoglobin 16.7 07/05/2022: BUN 27; Creatinine, Ser 1.89; Potassium 3.9; Sodium 139     Physical Exam:    VS:  BP 132/84   Pulse 77   Ht 5' 9.5" (1.765 m)   Wt 174 lb 6.4 oz  (79.1 kg)   SpO2 98%   BMI 25.39 kg/m     Wt Readings from Last 3 Encounters:  07/17/22 174 lb 6.4 oz (79.1 kg)  06/28/22 176 lb 1.9 oz (79.9 kg)  03/22/22 177 lb (80.3 kg)     GEN: Well nourished, well developed in no acute distress CARDIAC: RR, no murmurs, rubs, gallops RESPIRATORY:  Normal work of breathing MUSCULOSKELETAL: trace edema    ASSESSMENT & PLAN:    Persistent atrial fibrillation: I do not think he is a good candidate for ablation due to his advanced age and possibly prior surgical PFO closure, which could make the procedure more difficult or possibly even prevent transseptal access. I have not yet found an op note describing the PFO closure, but it is mentioned in progress notes. I am going to start him on amiodarone 200mg  PO BID. He has follow-up scheduled with Dr. in a few weeks and can arrange cardioversion at that time. Vericose veins: I'm not confident that discontinuation of anticoagulation would absolutely prevent bleeding, and doing so would necessitate LAA occlusion. I think it best to manage this issue by treating varicosities primarily. Secondary hypercoagulable state: continue anticoagulation. He is interested in switching to eliquis. I think this is reasonable but would recommend not making any changes until he has recovered from cardioversion.        Medication Adjustments/Labs and Tests Ordered: Current medicines are reviewed at length with the patient today.  Concerns regarding medicines are outlined above.  No orders of the defined types were placed in this encounter.  No orders of the defined types were placed in this encounter.    Signed, Jens Som, MD  07/17/2022 3:38 PM    [facility]

## 2022-08-01 NOTE — Progress Notes (Signed)
HPI: FU CAD and atrial fibrillation. Patient underwent coronary artery bypass and graft in 2007. He had a LIMA to the LAD, SVG to D1, sequential SVG to OM1 and OM2, sequential SVG to the PDA and posterior lateral. He also had closure of PFO. Abdominal ultrasound May 2014 showed no aneurysm. Had CVA in November of 2014. Transesophageal echocardiogram November 2014 showed normal LV function, mild prolapse of the posterior mitral valve leaflet and an atrial septal aneurysm with negative bubble study. Carotid Dopplers November 2014 showed less than 40% bilateral stenosis. 30 day event monitor showed atrial fibrillation. Nuclear study May 2017 showed ejection fraction 53%.  There was diaphragmatic attenuation but no ischemia. Echocardiogram February 2023 showed ejection fraction 45 to 50%, mild left atrial enlargement.  Patient had cardioversion October 03, 2021.  Subsequently noted to have symptomatic bradycardia.  Had pacemaker placed February 2023.  He was subsequently placed on amiodarone and underwent repeat cardioversion.  At last office visit patient was noted to be in recurrent atrial fibrillation.  He was symptomatic.  Patient seen by Dr. Myles Gip and felt not to be a good candidate for ablation.  Amiodarone was resumed with plans for repeat cardioversion.  Since last seen, he has pedal edema; no chest pain; mild DOE; also with RUQ soreness  Current Outpatient Medications  Medication Sig Dispense Refill   acetaminophen (TYLENOL) 500 MG tablet Take 1,000 mg by mouth every 6 (six) hours as needed (Arthritis).     amiodarone (PACERONE) 200 MG tablet Take 1 tablet (200 mg total) by mouth 2 (two) times daily. 180 tablet 3   carvedilol (COREG) 3.125 MG tablet TAKE 1 TABLET BY MOUTH TWICE A DAY WITH MEALS 60 tablet 10   cholecalciferol (VITAMIN D) 25 MCG (1000 UNIT) tablet Take 1,000 Units by mouth in the morning.     COD LIVER OIL PO Take 1 capsule by mouth at bedtime.      ezetimibe (ZETIA) 10 MG  tablet Take 10 mg by mouth every morning.     furosemide (LASIX) 20 MG tablet Take 3 tablets (60 mg total) by mouth daily. 270 tablet 3   loratadine (CLARITIN) 10 MG tablet Take 10 mg by mouth daily as needed for allergies.     losartan (COZAAR) 100 MG tablet Take 100 mg by mouth in the morning.     Multiple Vitamin (MULTIVITAMIN WITH MINERALS) TABS tablet Take 1 tablet by mouth in the morning.     omeprazole (PRILOSEC) 20 MG capsule Take 20 mg by mouth every other day. In the morning.     Polyethyl Glycol-Propyl Glycol (SYSTANE ULTRA OP) Place 1-2 drops into both eyes in the morning and at bedtime.     pravastatin (PRAVACHOL) 80 MG tablet Take 80 mg by mouth at bedtime.     saw palmetto 160 MG capsule Take 160 mg by mouth 2 (two) times daily.     tadalafil (CIALIS) 5 MG tablet Take 5 mg by mouth at bedtime.     TESTOSTERONE TD Place 1 mL onto the skin See admin instructions. 10% testosterone cream compounded at [place]  - apply 1 ml topically to shoulders once every morning     XARELTO 15 MG TABS tablet TAKE ONE (1) TABLET BY MOUTH EACH DAY WITH SUPPER 90 tablet 2   No current facility-administered medications for this visit.     Past Medical History:  Diagnosis Date   At risk for sleep apnea    STOP-BANG= 5  SENT TO PCP 05-20-2014   Bilateral carotid artery stenosis    mild bilateral proximcal ICA  40% per duplex 11/ 2014   Coronary artery disease    a. s/p CABG in 2007 w/ LIMA-LAD, SVG-D1, SVG-1st & 2nd Mrg, SVG-PDA and posterior lateral   ED (erectile dysfunction)    Eye infection 12/27/2014   GERD (gastroesophageal reflux disease)    History of embolic stroke no residual   11/ 2014  --  right PCA and MCA branch infarts (left side vision difficulties)   Hypertension    Other malaise and fatigue    Paroxysmal atrial fibrillation ([place])    Pneumonia 10/27/2015   led to CHF, in hospital x 6 days   S/P CABG x 6    2007   Ureteral obstruction, right     Past  Surgical History:  Procedure Laterality Date   APPENDECTOMY  1950's   CARDIAC CATHETERIZATION  10-31-2005  dr wall   severe three vessel/  perserved LV   CARDIOVASCULAR STRESS TEST  last one 05-19-2014  dr Stanford Breed   low risk lexiscan no exercise study/ small inferobasal infarct with no ischemia /  ef 53%   CARDIOVERSION N/A 10/03/2021   Procedure: CARDIOVERSION;  Surgeon: Werner Lean, MD;  Location: [facility];  Service: Cardiovascular;  Laterality: N/A;   CARDIOVERSION N/A 11/25/2021   Procedure: CARDIOVERSION;  Surgeon: Janina Mayo, MD;  Location[facility] ENDOSCOPY;  Service: Cardiovascular;  Laterality: N/A;   CARDIOVERSION N/A 12/28/2021   Procedure: CARDIOVERSION;  Surgeon: Berniece Salines, DO;  Location: [place];  Service: Cardiovascular;  Laterality: N/A;   CORONARY ARTERY BYPASS GRAFT  11-01-2005  dr Roxy Manns   CLOSURE OF PFO/  LIMA to LAD,  SVG to D1,  SVG to 1st & 2nd  MARGINAL branches, SVG  to PDA and posterior lateral   CYSTOSCOPY W/ URETERAL STENT PLACEMENT Right 05/21/2014   Procedure: CYSTOSCOPY WITH RETROGRADE PYELOGRAM/URETERAL STENT PLACEMENT;  Surgeon: Ailene Rud, MD;  Location: [place];  Service: Urology;  Laterality: Right;   CYSTOSCOPY WITH URETEROSCOPY Right 05/21/2014   Procedure: CYSTOSCOPY WITH URETEROSCOPY;  Surgeon: Ailene Rud, MD;  [facility];  Service: Urology;  Laterality: Right;   INGUINAL HERNIA REPAIR Bilateral    LAPAROSCOPIC CHOLECYSTECTOMY  10-02-2000   PACEMAKER IMPLANT N/A 10/11/2021   Procedure: PACEMAKER IMPLANT;  Surgeon: Evans Lance, MD;  Location: [place] [facility];  Service: Cardiovascular;  Laterality: N/A;   RIGHT URETER OBSTRUCTION SURGERY  age 62   TEE WITHOUT CARDIOVERSION N/A 07/17/2013   Procedure: TRANSESOPHAGEAL ECHOCARDIOGRAM (TEE);  Surgeon: Josue Hector, MD;  Location: [facility];  Service: Cardiovascular;  Laterality: N/A;  normal LV size, mild prolapse of  posterior mitral valve leaflet,  mild MR and TR, normal AV,  no LAA thrombus,  atrial septal aneurysm with no PFO/ ASD and negative bubble study, normal RV, normal aorta with no debris    Social History   Socioeconomic History   Marital status: Married    Spouse name: maxine   Number of children: 4   Years of education: college4   Highest education level: Not on file  Occupational History   Occupation: retired  Tobacco Use   Smoking status: Former    Packs/day: 1.00    Years: 35.00    Total pack years: 35.00    Types: Cigarettes    Quit date: 08/29/1977    Years since quitting: 44.9   Smokeless tobacco: Never  Substance and Sexual Activity   Alcohol use: No    Alcohol/week: 0.0 standard drinks of alcohol   Drug use: No   Sexual activity: Not on file  Other Topics Concern   Not on file  Social History Narrative   Patient is married with 4 children.   Patient is right handed.   Patient has college education.   Patient drinks decaff coffee.   Social Determinants of Health   Financial Resource Strain: Not on file  Food Insecurity: Not on file  Transportation Needs: Not on file  Physical Activity: Not on file  Stress: Not on file  Social Connections: Not on file  Intimate Partner Violence: Not on file    Family History  Problem Relation Age of Onset   Heart disease Mother        PPM   Hypertension Mother    Lymphoma Father    Prostate cancer Father    Prostate cancer Brother    Aneurysm Brother        REPAIR   CAD Brother        CABG   Other Brother        STENTS IN LEGS   Other Sister        STOMACH ISSUES   Depression Sister    Lung cancer Brother        Asbestosis    ROS: RUQ soreness but no fevers or chills, productive cough, hemoptysis, dysphasia, odynophagia, melena, hematochezia, dysuria, hematuria, rash, seizure activity, orthopnea, PND, claudication. Remaining systems are negative.  Physical Exam: Well-developed well-nourished in no acute  distress.  Skin is warm and dry.  HEENT is normal.  Neck is supple.  Chest is clear to auscultation with normal expansion.  Cardiovascular exam is irregular Abdominal exam nontender or distended. No masses palpated. Extremities show 2+ edema. neuro grossly intact  ECG- Ventricular paced with underlying atrial fibrillation; personally reviewed  A/P  1 persistent atrial fibrillation-patient remains in atrial fibrillation.  He has seen Dr. Nelly Laurence and felt not to be a candidate for ablation.  Amiodarone has been reinitiated.  Will decrease to 200 mg daily. Will plan to proceed with cardioversion in 3-4 weeks.  Hopefully CHF will improve if sinus maintained. Continue Xarelto (he has missed no doses).  Continue carvedilol at present dose.  2 chronic combined systolic/diastolic congestive heart failure-remains volume overloaded; DC lasix; begin demadex 60 mg daily. Check bmet and BNP in one week. Will repeat echocardiogram.  3 coronary artery disease-he is not having chest pain.  Continue medical therapy.  Continue statin.  No aspirin given need for anticoagulation.  4 hypertension-patient's blood pressure is controlled.  Continue present medications.  5 hyperlipidemia-continue pravastatin and Zetia.  6 pacemaker-Per EP.  7 Chronic stage 3b kidney disease-follow renal function closely with diuresis.  Olga Millers, MD

## 2022-08-01 NOTE — H&P (View-Only) (Signed)
HPI: FU CAD and atrial fibrillation. Patient underwent coronary artery bypass and graft in 2007. He had a LIMA to the LAD, SVG to D1, sequential SVG to OM1 and OM2, sequential SVG to the PDA and posterior lateral. He also had closure of PFO. Abdominal ultrasound May 2014 showed no aneurysm. Had CVA in November of 2014. Transesophageal echocardiogram November 2014 showed normal LV function, mild prolapse of the posterior mitral valve leaflet and an atrial septal aneurysm with negative bubble study. Carotid Dopplers November 2014 showed less than 40% bilateral stenosis. 30 day event monitor showed atrial fibrillation. Nuclear study May 2017 showed ejection fraction 53%.  There was diaphragmatic attenuation but no ischemia. Echocardiogram February 2023 showed ejection fraction 45 to 50%, mild left atrial enlargement.  Patient had cardioversion October 03, 2021.  Subsequently noted to have symptomatic bradycardia.  Had pacemaker placed February 2023.  He was subsequently placed on amiodarone and underwent repeat cardioversion.  At last office visit patient was noted to be in recurrent atrial fibrillation.  He was symptomatic.  Patient seen by Dr. Myles Gip and felt not to be a good candidate for ablation.  Amiodarone was resumed with plans for repeat cardioversion.  Since last seen, he has pedal edema; no chest pain; mild DOE; also with RUQ soreness  Current Outpatient Medications  Medication Sig Dispense Refill   acetaminophen (TYLENOL) 500 MG tablet Take 1,000 mg by mouth every 6 (six) hours as needed (Arthritis).     amiodarone (PACERONE) 200 MG tablet Take 1 tablet (200 mg total) by mouth 2 (two) times daily. 180 tablet 3   carvedilol (COREG) 3.125 MG tablet TAKE 1 TABLET BY MOUTH TWICE A DAY WITH MEALS 60 tablet 10   cholecalciferol (VITAMIN D) 25 MCG (1000 UNIT) tablet Take 1,000 Units by mouth in the morning.     COD LIVER OIL PO Take 1 capsule by mouth at bedtime.      ezetimibe (ZETIA) 10 MG  tablet Take 10 mg by mouth every morning.     furosemide (LASIX) 20 MG tablet Take 3 tablets (60 mg total) by mouth daily. 270 tablet 3   loratadine (CLARITIN) 10 MG tablet Take 10 mg by mouth daily as needed for allergies.     losartan (COZAAR) 100 MG tablet Take 100 mg by mouth in the morning.     Multiple Vitamin (MULTIVITAMIN WITH MINERALS) TABS tablet Take 1 tablet by mouth in the morning.     omeprazole (PRILOSEC) 20 MG capsule Take 20 mg by mouth every other day. In the morning.     Polyethyl Glycol-Propyl Glycol (SYSTANE ULTRA OP) Place 1-2 drops into both eyes in the morning and at bedtime.     pravastatin (PRAVACHOL) 80 MG tablet Take 80 mg by mouth at bedtime.     saw palmetto 160 MG capsule Take 160 mg by mouth 2 (two) times daily.     tadalafil (CIALIS) 5 MG tablet Take 5 mg by mouth at bedtime.     TESTOSTERONE TD Place 1 mL onto the skin See admin instructions. 10% testosterone cream compounded at [place]  - apply 1 ml topically to shoulders once every morning     XARELTO 15 MG TABS tablet TAKE ONE (1) TABLET BY MOUTH EACH DAY WITH SUPPER 90 tablet 2   No current facility-administered medications for this visit.     Past Medical History:  Diagnosis Date   At risk for sleep apnea    STOP-BANG= 5  SENT TO PCP 05-20-2014   Bilateral carotid artery stenosis    mild bilateral proximcal ICA  40% per duplex 11/ 2014   Coronary artery disease    a. s/p CABG in 2007 w/ LIMA-LAD, SVG-D1, SVG-1st & 2nd Mrg, SVG-PDA and posterior lateral   ED (erectile dysfunction)    Eye infection 12/27/2014   GERD (gastroesophageal reflux disease)    History of embolic stroke no residual   11/ 2014  --  right PCA and MCA branch infarts (left side vision difficulties)   Hypertension    Other malaise and fatigue    Paroxysmal atrial fibrillation ([place])    Pneumonia 10/27/2015   led to CHF, in hospital x 6 days   S/P CABG x 6    2007   Ureteral obstruction, right     Past  Surgical History:  Procedure Laterality Date   APPENDECTOMY  1950's   CARDIAC CATHETERIZATION  10-31-2005  dr wall   severe three vessel/  perserved LV   CARDIOVASCULAR STRESS TEST  last one 05-19-2014  dr Stanford Breed   low risk lexiscan no exercise study/ small inferobasal infarct with no ischemia /  ef 53%   CARDIOVERSION N/A 10/03/2021   Procedure: CARDIOVERSION;  Surgeon: Werner Lean, MD;  Location: [place];  Service: Cardiovascular;  Laterality: N/A;   CARDIOVERSION N/A 11/25/2021   Procedure: CARDIOVERSION;  Surgeon: Janina Mayo, MD;  Location: [facility] ENDOSCOPY;  Service: Cardiovascular;  Laterality: N/A;   CARDIOVERSION N/A 12/28/2021   Procedure: CARDIOVERSION;  Surgeon: Berniece Salines, DO;  Location: [place];  Service: Cardiovascular;  Laterality: N/A;   CORONARY ARTERY BYPASS GRAFT  11-01-2005  dr Roxy Manns   CLOSURE OF PFO/  LIMA to LAD,  SVG to D1,  SVG to 1st & 2nd  MARGINAL branches, SVG  to PDA and posterior lateral   CYSTOSCOPY W/ URETERAL STENT PLACEMENT Right 05/21/2014   Procedure: CYSTOSCOPY WITH RETROGRADE PYELOGRAM/URETERAL STENT PLACEMENT;  Surgeon: Ailene Rud, MD;  Location: [place];  Service: Urology;  Laterality: Right;   CYSTOSCOPY WITH URETEROSCOPY Right 05/21/2014   Procedure: CYSTOSCOPY WITH URETEROSCOPY;  Surgeon: Ailene Rud, MD;  Location: [facility];  Service: Urology;  Laterality: Right;   INGUINAL HERNIA REPAIR Bilateral    LAPAROSCOPIC CHOLECYSTECTOMY  10-02-2000   PACEMAKER IMPLANT N/A 10/11/2021   Procedure: PACEMAKER IMPLANT;  Surgeon: Evans Lance, MD;  Location: [place] CV [facility];  Service: Cardiovascular;  Laterality: N/A;   RIGHT URETER OBSTRUCTION SURGERY  age 74   TEE WITHOUT CARDIOVERSION N/A 07/17/2013   Procedure: TRANSESOPHAGEAL ECHOCARDIOGRAM (TEE);  Surgeon: Josue Hector, MD;  Location: [facility] ENDOSCOPY;  Service: Cardiovascular;  Laterality: N/A;  normal LV size, mild prolapse of  posterior mitral valve leaflet,  mild MR and TR, normal AV,  no LAA thrombus,  atrial septal aneurysm with no PFO/ ASD and negative bubble study, normal RV, normal aorta with no debris    Social History   Socioeconomic History   Marital status: Married    Spouse name: maxine   Number of children: 4   Years of education: college4   Highest education level: Not on file  Occupational History   Occupation: retired  Tobacco Use   Smoking status: Former    Packs/day: 1.00    Years: 35.00    Total pack years: 35.00    Types: Cigarettes    Quit date: 08/29/1977    Years since quitting: 44.9   Smokeless tobacco: Never  Substance and Sexual Activity   Alcohol use: No    Alcohol/week: 0.0 standard drinks of alcohol   Drug use: No   Sexual activity: Not on file  Other Topics Concern   Not on file  Social History Narrative   Patient is married with 4 children.   Patient is right handed.   Patient has college education.   Patient drinks decaff coffee.   Social Determinants of Health   Financial Resource Strain: Not on file  Food Insecurity: Not on file  Transportation Needs: Not on file  Physical Activity: Not on file  Stress: Not on file  Social Connections: Not on file  Intimate Partner Violence: Not on file    Family History  Problem Relation Age of Onset   Heart disease Mother        PPM   Hypertension Mother    Lymphoma Father    Prostate cancer Father    Prostate cancer Brother    Aneurysm Brother        REPAIR   CAD Brother        CABG   Other Brother        STENTS IN LEGS   Other Sister        STOMACH ISSUES   Depression Sister    Lung cancer Brother        Asbestosis    ROS: RUQ soreness but no fevers or chills, productive cough, hemoptysis, dysphasia, odynophagia, melena, hematochezia, dysuria, hematuria, rash, seizure activity, orthopnea, PND, claudication. Remaining systems are negative.  Physical Exam: Well-developed well-nourished in no acute  distress.  Skin is warm and dry.  HEENT is normal.  Neck is supple.  Chest is clear to auscultation with normal expansion.  Cardiovascular exam is irregular Abdominal exam nontender or distended. No masses palpated. Extremities show 2+ edema. neuro grossly intact  ECG- Ventricular paced with underlying atrial fibrillation; personally reviewed  A/P  1 persistent atrial fibrillation-patient remains in atrial fibrillation.  He has seen Dr. Nelly Laurence and felt not to be a candidate for ablation.  Amiodarone has been reinitiated.  Will decrease to 200 mg daily. Will plan to proceed with cardioversion in 3-4 weeks.  Hopefully CHF will improve if sinus maintained. Continue Xarelto (he has missed no doses).  Continue carvedilol at present dose.  2 chronic combined systolic/diastolic congestive heart failure-remains volume overloaded; DC lasix; begin demadex 60 mg daily. Check bmet and BNP in one week. Will repeat echocardiogram.  3 coronary artery disease-he is not having chest pain.  Continue medical therapy.  Continue statin.  No aspirin given need for anticoagulation.  4 hypertension-patient's blood pressure is controlled.  Continue present medications.  5 hyperlipidemia-continue pravastatin and Zetia.  6 pacemaker-Per EP.  7 Chronic stage 3b kidney disease-follow renal function closely with diuresis.  Olga Millers, MD

## 2022-08-02 NOTE — Progress Notes (Signed)
Remote pacemaker transmission.   

## 2022-08-09 ENCOUNTER — Other Ambulatory Visit: Payer: Self-pay | Admitting: *Deleted

## 2022-08-09 ENCOUNTER — Ambulatory Visit: Payer: Medicare Other | Attending: Cardiology | Admitting: Cardiology

## 2022-08-09 ENCOUNTER — Encounter: Payer: Self-pay | Admitting: Cardiology

## 2022-08-09 VITALS — BP 138/84 | HR 67 | Ht 69.5 in | Wt 162.0 lb

## 2022-08-09 DIAGNOSIS — I255 Ischemic cardiomyopathy: Secondary | ICD-10-CM

## 2022-08-09 DIAGNOSIS — I480 Paroxysmal atrial fibrillation: Secondary | ICD-10-CM

## 2022-08-09 DIAGNOSIS — I5032 Chronic diastolic (congestive) heart failure: Secondary | ICD-10-CM | POA: Insufficient documentation

## 2022-08-09 DIAGNOSIS — Z950 Presence of cardiac pacemaker: Secondary | ICD-10-CM | POA: Diagnosis present

## 2022-08-09 DIAGNOSIS — I4819 Other persistent atrial fibrillation: Secondary | ICD-10-CM

## 2022-08-09 DIAGNOSIS — I10 Essential (primary) hypertension: Secondary | ICD-10-CM | POA: Diagnosis not present

## 2022-08-09 DIAGNOSIS — I2510 Atherosclerotic heart disease of native coronary artery without angina pectoris: Secondary | ICD-10-CM

## 2022-08-09 MED ORDER — TORSEMIDE 20 MG PO TABS: 60.0000 mg | ORAL_TABLET | Freq: Every day | ORAL | 3 refills | Status: DC

## 2022-08-09 MED ORDER — AMIODARONE HCL 200 MG PO TABS: 200.0000 mg | ORAL_TABLET | Freq: Every day | ORAL | 3 refills | Status: DC

## 2022-08-09 NOTE — Patient Instructions (Signed)
Medication Instructions:   STOP FUROSEMIDE   START TORSEMIDE 60 MG ONCE DAILY= 3 OF THE 20 MG TABLETS ONCE DAILY  DECREASE AMIODARONE TO 200 MG ONCE DAILY  *If you need a refill on your cardiac medications before your next appointment, please call your pharmacy*   Lab Work:  Your physician recommends that you return for lab work in: ONE WEEK-DO NOT NEED TO FAST  [facility]  Located on the 2 nd floor in [address] Hours- Mon-Thur 8 am-11:30 am and 1 pm - 4:30 pm             Fri-8am - 11 am   If you have labs (blood work) drawn today and your tests are completely normal, you will receive your results only by: MyChart Message (if you have MyChart) OR A paper copy in the mail If you have any lab test that is abnormal or we need to change your treatment, we will call you to review the results.   Testing/Procedures:  Your physician has requested that you have an echocardiogram. Echocardiography is a painless test that uses sound waves to create images of your heart. It provides your doctor with information about the size and shape of your heart and how well your heart's chambers and valves are working. This procedure takes approximately one hour. There are no restrictions for this procedure. Please do NOT wear cologne, perfume, aftershave, or lotions (deodorant is allowed). Please arrive 15 minutes prior to your appointment time. [address]     You are scheduled for a Cardioversion on Thursday, January 4 with Dr. Mayford Knife.  Please arrive at the [facility] (Main Entrance A) at [facility]: [address] [place], [place] [address] at 7:00 AM.    DIET:  Nothing to eat or drink after midnight except a sip of water with medications (see medication instructions below)  MEDICATION INSTRUCTIONS      Continue taking your anticoagulant (blood thinner): Rivaroxaban (Xarelto).  FYI:  For your safety, and to allow [place] to monitor your vital signs  accurately during the surgery/procedure we request: If you have artificial nails, gel coating, SNS etc, please have those removed prior to your surgery/procedure. Not having the nail coverings /polish removed may result in cancellation or delay of your surgery/procedure.  You must have a responsible person to drive you home and stay in the waiting area during your procedure. Failure to do so could result in cancellation.  Bring your insurance cards.  *Special Note: Every effort is made to have your procedure done on time. Occasionally there are emergencies that occur at the hospital that may cause delays. Please be patient if a delay does occur.       Follow-Up: At [facility], you and your health needs are our priority.  As part of our continuing mission to provide you with exceptional heart care, we have created designated Provider Care Teams.  These Care Teams include your primary Cardiologist (physician) and Advanced Practice Providers (APPs -  Physician Assistants and Nurse Practitioners) who all work together to provide you with the care you need, when you need it.  We recommend signing up for the patient portal called "MyChart".  Sign up information is provided on this After Visit Summary.  MyChart is used to connect with patients for Virtual Visits (Telemedicine).  Patients are able to view lab/test results, encounter notes, upcoming appointments, etc.  Non-urgent messages can be sent to your provider as well.  To learn more about what you can do with MyChart, go to [email or web address].    Your next appointment:   3 month(s)  The format for your next appointment:   In Person  Provider:   Olga Millers, MD

## 2022-08-17 LAB — BASIC METABOLIC PANEL
BUN/Creatinine Ratio: 16 (ref 10–24)
BUN: 36 mg/dL — ABNORMAL HIGH (ref 8–27)
CO2: 25 mmol/L (ref 20–29)
Calcium: 9.4 mg/dL (ref 8.6–10.2)
Chloride: 97 mmol/L (ref 96–106)
Creatinine, Ser: 2.21 mg/dL — ABNORMAL HIGH (ref 0.76–1.27)
Glucose: 115 mg/dL — ABNORMAL HIGH (ref 70–99)
Potassium: 4.4 mmol/L (ref 3.5–5.2)
Sodium: 139 mmol/L (ref 134–144)
eGFR: 28 mL/min/{1.73_m2} — ABNORMAL LOW (ref >59)

## 2022-08-17 LAB — PRO B NATRIURETIC PEPTIDE: NT-Pro BNP: 1784 pg/mL — ABNORMAL HIGH (ref 0–486)

## 2022-08-18 ENCOUNTER — Other Ambulatory Visit: Payer: Self-pay | Admitting: *Deleted

## 2022-08-18 ENCOUNTER — Encounter: Payer: Self-pay | Admitting: *Deleted

## 2022-08-18 DIAGNOSIS — I5032 Chronic diastolic (congestive) heart failure: Secondary | ICD-10-CM

## 2022-08-29 ENCOUNTER — Telehealth: Payer: Self-pay | Admitting: Cardiology

## 2022-08-29 NOTE — Telephone Encounter (Signed)
Labs sent via [facility] fax.

## 2022-08-29 NOTE — Telephone Encounter (Signed)
Office calling to request copy of pt's BMET results to be faxed over. Please advise

## 2022-08-29 NOTE — Telephone Encounter (Signed)
Patient would like a call back from [facility].

## 2022-08-29 NOTE — Telephone Encounter (Signed)
Contacted patient, advised of his lab work he received in the mail- he will do the repeat lab work tomorrow- also in preparation for cardioversion on 01/04.

## 2022-08-31 ENCOUNTER — Ambulatory Visit (HOSPITAL_COMMUNITY)
Admission: RE | Admit: 2022-08-31 | Discharge: 2022-08-31 | Disposition: A | Discharge status: Home / Self Care | Payer: Medicare Other | Source: Ambulatory Visit | Attending: Cardiology | Admitting: Cardiology

## 2022-08-31 ENCOUNTER — Ambulatory Visit (HOSPITAL_COMMUNITY): Payer: Medicare Other | Admitting: Certified Registered Nurse Anesthetist

## 2022-08-31 ENCOUNTER — Encounter (HOSPITAL_COMMUNITY): Payer: Self-pay | Admitting: Cardiology

## 2022-08-31 ENCOUNTER — Ambulatory Visit (HOSPITAL_BASED_OUTPATIENT_CLINIC_OR_DEPARTMENT_OTHER): Payer: Medicare Other | Admitting: Certified Registered Nurse Anesthetist

## 2022-08-31 ENCOUNTER — Encounter (HOSPITAL_COMMUNITY)
Admission: RE | Disposition: A | Discharge status: Home / Self Care | Payer: Self-pay | Source: Ambulatory Visit | Attending: Cardiology

## 2022-08-31 DIAGNOSIS — E785 Hyperlipidemia, unspecified: Secondary | ICD-10-CM | POA: Diagnosis not present

## 2022-08-31 DIAGNOSIS — Z8673 Personal history of transient ischemic attack (TIA), and cerebral infarction without residual deficits: Secondary | ICD-10-CM | POA: Diagnosis not present

## 2022-08-31 DIAGNOSIS — Z87891 Personal history of nicotine dependence: Secondary | ICD-10-CM | POA: Diagnosis not present

## 2022-08-31 DIAGNOSIS — K219 Gastro-esophageal reflux disease without esophagitis: Secondary | ICD-10-CM | POA: Diagnosis not present

## 2022-08-31 DIAGNOSIS — I480 Paroxysmal atrial fibrillation: Secondary | ICD-10-CM

## 2022-08-31 DIAGNOSIS — I493 Ventricular premature depolarization: Secondary | ICD-10-CM | POA: Insufficient documentation

## 2022-08-31 DIAGNOSIS — I4891 Unspecified atrial fibrillation: Secondary | ICD-10-CM

## 2022-08-31 DIAGNOSIS — Z7901 Long term (current) use of anticoagulants: Secondary | ICD-10-CM | POA: Diagnosis not present

## 2022-08-31 DIAGNOSIS — Z950 Presence of cardiac pacemaker: Secondary | ICD-10-CM | POA: Insufficient documentation

## 2022-08-31 DIAGNOSIS — I509 Heart failure, unspecified: Secondary | ICD-10-CM | POA: Diagnosis not present

## 2022-08-31 DIAGNOSIS — I2510 Atherosclerotic heart disease of native coronary artery without angina pectoris: Secondary | ICD-10-CM | POA: Insufficient documentation

## 2022-08-31 DIAGNOSIS — I110 Hypertensive heart disease with heart failure: Secondary | ICD-10-CM | POA: Insufficient documentation

## 2022-08-31 DIAGNOSIS — Z951 Presence of aortocoronary bypass graft: Secondary | ICD-10-CM | POA: Insufficient documentation

## 2022-08-31 DIAGNOSIS — I5042 Chronic combined systolic (congestive) and diastolic (congestive) heart failure: Secondary | ICD-10-CM | POA: Diagnosis not present

## 2022-08-31 DIAGNOSIS — I4819 Other persistent atrial fibrillation: Secondary | ICD-10-CM | POA: Diagnosis not present

## 2022-08-31 DIAGNOSIS — Z79899 Other long term (current) drug therapy: Secondary | ICD-10-CM | POA: Diagnosis not present

## 2022-08-31 HISTORY — PX: CARDIOVERSION: SHX1299

## 2022-08-31 LAB — BASIC METABOLIC PANEL
BUN/Creatinine Ratio: 13 (ref 10–24)
BUN: 32 mg/dL — ABNORMAL HIGH (ref 8–27)
CO2: 25 mmol/L (ref 20–29)
Calcium: 9.2 mg/dL (ref 8.6–10.2)
Chloride: 98 mmol/L (ref 96–106)
Creatinine, Ser: 2.47 mg/dL — ABNORMAL HIGH (ref 0.76–1.27)
Glucose: 144 mg/dL — ABNORMAL HIGH (ref 70–99)
Potassium: 4 mmol/L (ref 3.5–5.2)
Sodium: 139 mmol/L (ref 134–144)
eGFR: 24 mL/min/{1.73_m2} — ABNORMAL LOW (ref >59)

## 2022-08-31 SURGERY — CARDIOVERSION
Anesthesia: General

## 2022-08-31 MED ORDER — PROPOFOL 10 MG/ML IV BOLUS
INTRAVENOUS | Status: DC | PRN
  Administered 2022-08-31: 60 mg via INTRAVENOUS

## 2022-08-31 MED ORDER — SODIUM CHLORIDE 0.9 % IV SOLN: INTRAVENOUS | Status: DC

## 2022-08-31 MED ORDER — LIDOCAINE 2% (20 MG/ML) 5 ML SYRINGE
INTRAMUSCULAR | Status: DC | PRN
  Administered 2022-08-31: 20 mg via INTRAVENOUS

## 2022-08-31 NOTE — CV Procedure (Signed)
    Electrical Cardioversion Procedure Note Jose [name] [id number] Jan 25, 1931  Procedure: Electrical Cardioversion Indications:  Atrial Fibrillation  Time Out: Verified patient identification, verified procedure,medications/allergies/relevent history reviewed, required imaging and test results available.  Performed  Procedure Details  The patient was NPO after midnight. Anesthesia was administered at the beside  by Dr.[name] with 60mg  of propofol and 20mg  Lidocaine.  Cardioversion was done with synchronized biphasic defibrillation with AP pads with 150watts.  The patient converted to normal sinus rhythm. The patient tolerated the procedure well   IMPRESSION:  Successful cardioversion of atrial fibrillation to A paced V sensed with PVCs    Jose [name] 08/31/2022, 7:37 AM

## 2022-08-31 NOTE — Anesthesia Procedure Notes (Signed)
Procedure Name: General with mask airway Date/Time: 08/31/2022 7:54 AM  Performed by: Harden Mo, CRNAPre-anesthesia Checklist: Patient identified, Emergency Drugs available, Suction available and Patient being monitored Patient Re-evaluated:Patient Re-evaluated prior to induction Oxygen Delivery Method: Ambu bag Preoxygenation: Pre-oxygenation with 100% oxygen Induction Type: IV induction Placement Confirmation: positive ETCO2 and breath sounds checked- equal and bilateral Dental Injury: Teeth and Oropharynx as per pre-operative assessment

## 2022-08-31 NOTE — Interval H&P Note (Signed)
History and Physical Interval Note:  08/31/2022 7:36 AM  Jose [name]  has presented today for surgery, with the diagnosis of AFIB.  The various methods of treatment have been discussed with the patient and family. After consideration of risks, benefits and other options for treatment, the patient has consented to  Procedure(s): CARDIOVERSION (N/A) as a surgical intervention.  The patient's history has been reviewed, patient examined, no change in status, stable for surgery.  I have reviewed the patient's chart and labs.  Questions were answered to the patient's satisfaction.     [name]

## 2022-08-31 NOTE — Transfer of Care (Signed)
Immediate Anesthesia Transfer of Care Note  Patient: Jose [name]  Procedure(s) Performed: CARDIOVERSION  Patient Location: Endoscopy Unit  Anesthesia Type:General  Level of Consciousness: awake and alert   Airway & Oxygen Therapy: Patient Spontanous Breathing  Post-op Assessment: Report given to RN, Post -op Vital signs reviewed and stable, and Patient moving all extremities X 4  Post vital signs: Reviewed and stable  Last Vitals:  Vitals Value Taken Time  BP 111/80   Temp    Pulse 54   Resp 12   SpO2 97     Last Pain:  Vitals:   08/31/22 0720  TempSrc: Temporal  PainSc: 0-No pain         Complications: No notable events documented.

## 2022-08-31 NOTE — Anesthesia Preprocedure Evaluation (Signed)
Anesthesia Evaluation  Patient identified by MRN, date of birth, ID band Patient awake    Reviewed: Allergy & Precautions, NPO status , Patient's Chart, lab work & pertinent test results, reviewed documented beta blocker date and time   History of Anesthesia Complications Negative for: history of anesthetic complications  Airway Mallampati: II  TM Distance: >3 FB Neck ROM: Full    Dental  (+) Edentulous Lower, Edentulous Upper   Pulmonary former smoker   Pulmonary exam normal        Cardiovascular hypertension, Pt. on medications and Pt. on home beta blockers + CAD, + CABG (2007) and +CHF  Normal cardiovascular exam+ dysrhythmias Atrial Fibrillation   TTE 09/2021: EF 45-50%, basal to mid inferolateral akinesis, mild LVH, RV systolic function moderately reduced, mild LAE   Neuro/Psych negative neurological ROS  negative psych ROS   GI/Hepatic Neg liver ROS,GERD  Medicated and Controlled,,  Endo/Other  negative endocrine ROS    Renal/GU Renal InsufficiencyRenal disease  negative genitourinary   Musculoskeletal negative musculoskeletal ROS (+)    Abdominal   Peds  Hematology negative hematology ROS (+)   Anesthesia Other Findings Day of surgery medications reviewed with patient.  Reproductive/Obstetrics negative OB ROS                              Anesthesia Physical Anesthesia Plan  ASA: 3  Anesthesia Plan: General   Post-op Pain Management: Minimal or no pain anticipated   Induction: Intravenous  PONV Risk Score and Plan: Treatment may vary due to age or medical condition and Propofol infusion  Airway Management Planned: Mask  Additional Equipment: None  Intra-op Plan:   Post-operative Plan:   Informed Consent: I have reviewed the patients History and Physical, chart, labs and discussed the procedure including the risks, benefits and alternatives for the proposed anesthesia  with the patient or authorized representative who has indicated his/her understanding and acceptance.       Plan Discussed with: CRNA  Anesthesia Plan Comments:          Anesthesia Quick Evaluation

## 2022-09-01 NOTE — Anesthesia Postprocedure Evaluation (Signed)
Anesthesia Post Note  Patient: Jose [name]  Procedure(s) Performed: CARDIOVERSION     Patient location during evaluation: PACU Anesthesia Type: General Level of consciousness: awake and alert Pain management: pain level controlled Vital Signs Assessment: post-procedure vital signs reviewed and stable Respiratory status: spontaneous breathing, nonlabored ventilation, respiratory function stable and patient connected to nasal cannula oxygen Cardiovascular status: blood pressure returned to baseline and stable Postop Assessment: no apparent nausea or vomiting Anesthetic complications: no   No notable events documented.  Last Vitals:  Vitals:   08/31/22 0830 08/31/22 0840  BP: 102/69 121/77  Pulse: 60 60  Resp: 17 17  Temp:    SpO2: 94% 95%    Last Pain:  Vitals:   08/31/22 0840  TempSrc:   PainSc: 0-No pain                 Tiajuana Amass

## 2022-09-03 ENCOUNTER — Encounter (HOSPITAL_COMMUNITY): Payer: Self-pay | Admitting: Cardiology

## 2022-09-04 ENCOUNTER — Ambulatory Visit (HOSPITAL_COMMUNITY): Payer: Medicare Other | Attending: Cardiology

## 2022-09-04 DIAGNOSIS — I5032 Chronic diastolic (congestive) heart failure: Secondary | ICD-10-CM | POA: Insufficient documentation

## 2022-09-04 LAB — ECHOCARDIOGRAM COMPLETE
Area-P 1/2: 4.21 cm2
MV M vel: 4.33 m/s
MV Peak grad: 75 mmHg
Radius: 0.75 cm
S' Lateral: 5 cm

## 2022-09-07 NOTE — Progress Notes (Signed)
HPI: FU CAD and atrial fibrillation. Patient underwent coronary artery bypass and graft in 2007. He had a LIMA to the LAD, SVG to D1, sequential SVG to OM1 and OM2, sequential SVG to the PDA and posterior lateral. He also had closure of PFO. Abdominal ultrasound May 2014 showed no aneurysm. Had CVA in November of 2014. Transesophageal echocardiogram November 2014 showed normal LV function, mild prolapse of the posterior mitral valve leaflet and an atrial septal aneurysm with negative bubble study. Carotid Dopplers November 2014 showed less than 40% bilateral stenosis. 30 day event monitor showed atrial fibrillation. Nuclear study May 2017 showed ejection fraction 53%.  There was diaphragmatic attenuation but no ischemia. Echocardiogram February 2023 showed ejection fraction 45 to 50%, mild left atrial enlargement.  Patient had cardioversion October 03, 2021.  Subsequently noted to have symptomatic bradycardia.  Had pacemaker placed February 2023.  He was subsequently placed on amiodarone and underwent repeat cardioversion.  At last office visit patient was noted to be in recurrent atrial fibrillation.  He was symptomatic.  Patient seen by Dr. Myles Gip and felt not to be a good candidate for ablation.  Amiodarone was resumed with plans for repeat cardioversion.  Had repeat DCCV 08/31/22. FU echo 1/24 showed EF 25-30, mild LVE, moderate RVE, mild RV dysfunction, biatrial enlargement, moderate MR and TR. Since last seen, patient feels better.  He denies dyspnea on exertion, orthopnea, PND, chest pain or syncope.  His pedal edema has improved.  Current Outpatient Medications  Medication Sig Dispense Refill   amiodarone (PACERONE) 200 MG tablet Take 1 tablet (200 mg total) by mouth daily. 180 tablet 3   Bacitracin (ANTIBIOTIC EX) Apply 1 Application topically daily as needed (wound care).     carvedilol (COREG) 3.125 MG tablet TAKE 1 TABLET BY MOUTH TWICE A DAY WITH MEALS 60 tablet 10   cholecalciferol  (VITAMIN D) 25 MCG (1000 UNIT) tablet Take 1,000 Units by mouth in the morning.     COD LIVER OIL PO Take 1 capsule by mouth at bedtime.      ezetimibe (ZETIA) 10 MG tablet Take 10 mg by mouth every morning.     loratadine (CLARITIN) 10 MG tablet Take 10 mg by mouth daily as needed for allergies.     losartan (COZAAR) 100 MG tablet Take 100 mg by mouth in the morning.     Multiple Vitamin (MULTIVITAMIN WITH MINERALS) TABS tablet Take 1 tablet by mouth in the morning.     omeprazole (PRILOSEC) 20 MG capsule Take 20 mg by mouth every other day. In the morning.     Polyethyl Glycol-Propyl Glycol (SYSTANE ULTRA OP) Place 1-2 drops into both eyes in the morning and at bedtime.     pravastatin (PRAVACHOL) 80 MG tablet Take 80 mg by mouth at bedtime.     saw palmetto 160 MG capsule Take 160 mg by mouth 2 (two) times daily.     tadalafil (CIALIS) 5 MG tablet Take 5 mg by mouth at bedtime.     TESTOSTERONE TD Place 1 mL onto the skin See admin instructions. 10% testosterone cream compounded at [facility]  - apply 1 ml topically to shoulders once every morning     torsemide (DEMADEX) 20 MG tablet Take 3 tablets (60 mg total) by mouth daily. 270 tablet 3   White Petrolatum (VASELINE EX) Apply 1 Application topically daily.     XARELTO 15 MG TABS tablet TAKE ONE (1) TABLET BY MOUTH EACH DAY WITH SUPPER  90 tablet 2   No current facility-administered medications for this visit.     Past Medical History:  Diagnosis Date   At risk for sleep apnea    STOP-BANG= 5   SENT TO PCP 05-20-2014   Bilateral carotid artery stenosis    mild bilateral proximcal ICA  40% per duplex 11/ 2014   Coronary artery disease    a. s/p CABG in 2007 w/ LIMA-LAD, SVG-D1, SVG-1st & 2nd Mrg, SVG-PDA and posterior lateral   ED (erectile dysfunction)    Eye infection 12/27/2014   GERD (gastroesophageal reflux disease)    History of embolic stroke no residual   11/ 2014  --  right PCA and MCA branch infarts (left side  vision difficulties)   Hypertension    Other malaise and fatigue    Paroxysmal atrial fibrillation ([place])    Pneumonia 10/27/2015   led to CHF, in hospital x 6 days   S/P CABG x 6    2007   Ureteral obstruction, right     Past Surgical History:  Procedure Laterality Date   APPENDECTOMY  1950's   CARDIAC CATHETERIZATION  10-31-2005  dr wall   severe three vessel/  perserved LV   CARDIOVASCULAR STRESS TEST  last one 05-19-2014  dr Stanford Breed   low risk lexiscan no exercise study/ small inferobasal infarct with no ischemia /  ef 53%   CARDIOVERSION N/A 10/03/2021   Procedure: CARDIOVERSION;  Surgeon: Werner Lean, MD;  Location: [facility] ENDOSCOPY;  Service: Cardiovascular;  Laterality: N/A;   CARDIOVERSION N/A 11/25/2021   Procedure: CARDIOVERSION;  Surgeon: Janina Mayo, MD;  Location: [facility] ENDOSCOPY;  Service: Cardiovascular;  Laterality: N/A;   CARDIOVERSION N/A 12/28/2021   Procedure: CARDIOVERSION;  Surgeon: Berniece Salines, DO;  Location: [facility];  Service: Cardiovascular;  Laterality: N/A;   CARDIOVERSION N/A 08/31/2022   Procedure: CARDIOVERSION;  Surgeon: Sueanne Margarita, MD;  Location: [facility] ENDOSCOPY;  Service: Cardiovascular;  Laterality: N/A;   CORONARY ARTERY BYPASS GRAFT  11-01-2005  dr Roxy Manns   CLOSURE OF PFO/  LIMA to LAD,  SVG to D1,  SVG to 1st & 2nd  MARGINAL branches, SVG  to PDA and posterior lateral   CYSTOSCOPY W/ URETERAL STENT PLACEMENT Right 05/21/2014   Procedure: CYSTOSCOPY WITH RETROGRADE PYELOGRAM/URETERAL STENT PLACEMENT;  Surgeon: Ailene Rud, MD;  Location: [facility];  Service: Urology;  Laterality: Right;   CYSTOSCOPY WITH URETEROSCOPY Right 05/21/2014   Procedure: CYSTOSCOPY WITH URETEROSCOPY;  Surgeon: Ailene Rud, MD;  Location: [facility];  Service: Urology;  Laterality: Right;   INGUINAL HERNIA REPAIR Bilateral    LAPAROSCOPIC CHOLECYSTECTOMY  10-02-2000   PACEMAKER IMPLANT N/A 10/11/2021   Procedure:  PACEMAKER IMPLANT;  Surgeon: Evans Lance, MD;  Location: [facility] CV LAB;  Service: Cardiovascular;  Laterality: N/A;   RIGHT URETER OBSTRUCTION SURGERY  age 9   TEE WITHOUT CARDIOVERSION N/A 07/17/2013   Procedure: TRANSESOPHAGEAL ECHOCARDIOGRAM (TEE);  Surgeon: Josue Hector, MD;  Location: [facility] ENDOSCOPY;  Service: Cardiovascular;  Laterality: N/A;  normal LV size, mild prolapse of posterior mitral valve leaflet,  mild MR and TR, normal AV,  no LAA thrombus,  atrial septal aneurysm with no PFO/ ASD and negative bubble study, normal RV, normal aorta with no debris    Social History   Socioeconomic History   Marital status: Married    Spouse name: maxine   Number of children: 4   Years of education: college4   Highest  education level: Not on file  Occupational History   Occupation: retired  Tobacco Use   Smoking status: Former    Packs/day: 1.00    Years: 35.00    Total pack years: 35.00    Types: Cigarettes    Quit date: 08/29/1977    Years since quitting: 45.0   Smokeless tobacco: Never  Substance and Sexual Activity   Alcohol use: No    Alcohol/week: 0.0 standard drinks of alcohol   Drug use: No   Sexual activity: Not on file  Other Topics Concern   Not on file  Social History Narrative   Patient is married with 4 children.   Patient is right handed.   Patient has college education.   Patient drinks decaff coffee.   Social Determinants of Health   Financial Resource Strain: Not on file  Food Insecurity: Not on file  Transportation Needs: Not on file  Physical Activity: Not on file  Stress: Not on file  Social Connections: Not on file  Intimate Partner Violence: Not on file    Family History  Problem Relation Age of Onset   Heart disease Mother        PPM   Hypertension Mother    Lymphoma Father    Prostate cancer Father    Prostate cancer Brother    Aneurysm Brother        REPAIR   CAD Brother        CABG   Other Brother        STENTS IN LEGS    Other Sister        STOMACH ISSUES   Depression Sister    Lung cancer Brother        Asbestosis    ROS: no fevers or chills, productive cough, hemoptysis, dysphasia, odynophagia, melena, hematochezia, dysuria, hematuria, rash, seizure activity, orthopnea, PND, pedal edema, claudication. Remaining systems are negative.  Physical Exam: Well-developed well-nourished in no acute distress.  Skin is warm and dry.  HEENT is normal.  Neck is supple.  Chest is clear to auscultation with normal expansion.  Cardiovascular exam is regular rate and rhythm.  Abdominal exam nontender or distended. No masses palpated. Extremities show trace edema. neuro grossly intact  ECG-atrial paced rhythm with prolonged AV conduction, inferolateral T wave inversion.  Personally reviewed  A/P  1 persistent atrial fibrillation-pt is S/P DCCV and remains in atrial paced rhythm. Continue amiodarone. He has seen Dr. Nelly Laurence and felt not to be a candidate for ablation. Continue Xarelto and carvediolol.  Will need follow-up TSH, liver functions and chest x-ray in the future.   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  Some of his CHF may have been secondary to atrial fibrillation.  He is much improved compared to last office visit.  I will decrease Demadex to 40 mg daily with an additional 20 mg for weight gain of 3 pounds, worsening edema or shortness of breath.  Check BMET one week.  Atrial fibrillation may have contributed to his cardiomyopathy.  Will plan to repeat echocardiogram in 3 months.  Hopefully LV function will have improved.  If not may need ischemia evaluation though would like to avoid catheterization given patient's age and baseline renal insufficiency/risk  of contrast nephropathy.   3 coronary artery disease-he is  not having chest pain.  Continue medical therapy.  Continue statin.  No aspirin given need for anticoagulation.   4 hypertension-patient's blood pressure is controlled.  Continue present medications.   5 hyperlipidemia-continue pravastatin and Zetia.   6 pacemaker-Per EP.   7 Chronic stage 3b kidney disease-follow renal function closely with medication changes.  Patient has been referred to nephrology.  Olga Millers, MD

## 2022-09-11 ENCOUNTER — Ambulatory Visit (INDEPENDENT_AMBULATORY_CARE_PROVIDER_SITE_OTHER): Payer: Medicare Other | Admitting: Cardiology

## 2022-09-11 ENCOUNTER — Encounter: Payer: Self-pay | Admitting: Cardiology

## 2022-09-11 VITALS — BP 145/76 | HR 60 | Ht 69.0 in | Wt 171.1 lb

## 2022-09-11 DIAGNOSIS — I2510 Atherosclerotic heart disease of native coronary artery without angina pectoris: Secondary | ICD-10-CM

## 2022-09-11 DIAGNOSIS — I5032 Chronic diastolic (congestive) heart failure: Secondary | ICD-10-CM

## 2022-09-11 DIAGNOSIS — I480 Paroxysmal atrial fibrillation: Secondary | ICD-10-CM

## 2022-09-11 DIAGNOSIS — I10 Essential (primary) hypertension: Secondary | ICD-10-CM

## 2022-09-11 DIAGNOSIS — Z950 Presence of cardiac pacemaker: Secondary | ICD-10-CM

## 2022-09-11 MED ORDER — EMPAGLIFLOZIN 10 MG PO TABS: 10.0000 mg | ORAL_TABLET | Freq: Every day | ORAL | 11 refills | Status: DC

## 2022-09-11 MED ORDER — TORSEMIDE 20 MG PO TABS: 40.0000 mg | ORAL_TABLET | Freq: Every day | ORAL | 3 refills | Status: DC

## 2022-09-11 NOTE — Patient Instructions (Signed)
Medication Instructions:   START JARDIANCE 10 MG ONCE DAILY  REDUCE TORSEMIDE TO 40 MG ONCE DAILY=2 OF THE 20 MG TABLETS ONCE DAILY-MAY TAKE EXTRA TABLET AS NEEDED FOR INCREASED SWELLING OR WEIGHT GAIN  *If you need a refill on your cardiac medications before your next appointment, please call your pharmacy*   Lab Work:  Your physician recommends that you return for lab work in: [facility] on the 2 nd floor in [address] Hours- Mon-Thur 8 am-11:30 am and 1 pm - 4:30 pm             Fri-8am - 11 am   If you have labs (blood work) drawn today and your tests are completely normal, you will receive your results only by: [employer] (if you have MyChart) OR A paper copy in the mail If you have any lab test that is abnormal or we need to change your treatment, we will call you to review the results.     Follow-Up: At [facility], you and your health needs are our priority.  As part of our continuing mission to provide you with exceptional heart care, we have created designated Provider Care Teams.  These Care Teams include your primary Cardiologist (physician) and Advanced Practice Providers (APPs -  Physician Assistants and Nurse Practitioners) who all work together to provide you with the care you need, when you need it.  We recommend signing up for the patient portal called "MyChart".  Sign up information is provided on this After Visit Summary.  MyChart is used to connect with patients for Virtual Visits (Telemedicine).  Patients are able to view lab/test results, encounter notes, upcoming appointments, etc.  Non-urgent messages can be sent to your provider as well.   To learn more about what you can do with MyChart, go to [email or web address].    Your next appointment:   8 month(s)  Provider:   Kirk Ruths, MD

## 2022-09-21 ENCOUNTER — Other Ambulatory Visit: Payer: Self-pay | Admitting: *Deleted

## 2022-09-21 DIAGNOSIS — I5032 Chronic diastolic (congestive) heart failure: Secondary | ICD-10-CM

## 2022-09-21 LAB — BASIC METABOLIC PANEL
BUN/Creatinine Ratio: 13 (ref 10–24)
BUN: 33 mg/dL — ABNORMAL HIGH (ref 8–27)
CO2: 22 mmol/L (ref 20–29)
Calcium: 9.5 mg/dL (ref 8.6–10.2)
Chloride: 96 mmol/L (ref 96–106)
Creatinine, Ser: 2.57 mg/dL — ABNORMAL HIGH (ref 0.76–1.27)
Glucose: 79 mg/dL (ref 70–99)
Potassium: 4.2 mmol/L (ref 3.5–5.2)
Sodium: 138 mmol/L (ref 134–144)
eGFR: 23 mL/min/{1.73_m2} — ABNORMAL LOW (ref >59)

## 2022-10-02 ENCOUNTER — Ambulatory Visit (INDEPENDENT_AMBULATORY_CARE_PROVIDER_SITE_OTHER): Payer: Medicare Other | Admitting: Orthopedic Surgery

## 2022-10-02 ENCOUNTER — Encounter: Payer: Self-pay | Admitting: Orthopedic Surgery

## 2022-10-02 ENCOUNTER — Telehealth: Payer: Self-pay | Admitting: Cardiology

## 2022-10-02 DIAGNOSIS — B351 Tinea unguium: Secondary | ICD-10-CM

## 2022-10-02 DIAGNOSIS — L97521 Non-pressure chronic ulcer of other part of left foot limited to breakdown of skin: Secondary | ICD-10-CM | POA: Diagnosis not present

## 2022-10-02 DIAGNOSIS — I2510 Atherosclerotic heart disease of native coronary artery without angina pectoris: Secondary | ICD-10-CM

## 2022-10-02 NOTE — Progress Notes (Signed)
Office Visit Note   Patient: Jose [name]           Date of Birth: [birth year]           MRN: [id number] Visit Date: 10/02/2022              Requested by: Derinda Late, MD [address] [place],  [place] [address] PCP: Derinda Late, MD  Chief Complaint  Patient presents with   Right Foot - Follow-up    Bilateral toe nail trimming   Left Foot - Follow-up     Callus pared x 1. HPI: Patient is an 89 year old gentleman who is seen in follow-up for painful onychomycotic nails x 10.  Patient also complains of a painful callus beneath the fourth metatarsal head.  Assessment & Plan: Visit Diagnoses:  1. Onychomycosis   2. Non-pressure chronic ulcer of other part of left foot limited to breakdown of skin ([place])     Plan: Nails were trimmed x 10  Follow-Up Instructions: No follow-ups on file.   Ortho Exam  Patient is alert, oriented, no adenopathy, well-dressed, normal affect, normal respiratory effort. Examination patient has a callus beneath the fourth metatarsal head left foot.  After informed consent a 10 blade knife was used debride the skin and soft tissue back to healthy viable tissue.  There is no abscess no tunneling no signs of deep infection.  Silver nitrate was used hemostasis and a Band-Aid was applied.  Patient has thickened discolored onychomycotic nails x 10 there is no signs of infection he is unable to safely trim the nails on his own and the nails were trimmed x 10 without complication.  Imaging: No results found. No images are attached to the encounter.  Labs: Lab Results  Component Value Date   HGBA1C 6.2 (H) 07/10/2013   ESRSEDRATE 12 01/13/2016   ESRSEDRATE 20 12/21/2015   ESRSEDRATE 94 (H) 11/26/2015   REPTSTATUS 11/24/2015 FINAL 11/24/2015   REPTSTATUS 11/28/2015 FINAL 11/24/2015   GRAMSTAIN  11/24/2015    ABUNDANT WBC PRESENT,BOTH PMN AND MONONUCLEAR FEW SQUAMOUS EPITHELIAL CELLS PRESENT MODERATE GRAM POSITIVE COCCI IN PAIRS MODERATE  GRAM NEGATIVE RODS Performed at [facility]  11/24/2015    NORMAL OROPHARYNGEAL FLORA Performed at [facility]      Lab Results  Component Value Date   ALBUMIN 4.0 11/08/2021   ALBUMIN 3.8 10/10/2021   ALBUMIN 4.3 09/28/2021    Lab Results  Component Value Date   MG 2.0 10/10/2021   MG 1.6 (L) 11/22/2015   No results found for: "VD25OH"  No results found for: "PREALBUMIN"    Latest Ref Rng & Units 11/25/2021   10:08 AM 10/10/2021   10:20 AM 09/28/2021    8:57 AM  CBC EXTENDED  WBC 4.0 - 10.5 K/uL  5.6  5.5   RBC 4.22 - 5.81 MIL/uL  5.50  5.52   Hemoglobin 13.0 - 17.0 g/dL 16.7  16.7  16.5   HCT 39.0 - 52.0 % 49.0  50.3  49.9   Platelets 150 - 400 K/uL  139  125   NEUT# 1.7 - 7.7 K/uL  3.7    Lymph# 0.7 - 4.0 K/uL  1.2       There is no height or weight on file to calculate BMI.  Orders:  No orders of the defined types were placed in this encounter.  No orders of the defined types were placed in this encounter.    Procedures: No  procedures performed  Clinical Data: No additional findings.  ROS:  All other systems negative, except as noted in the HPI. Review of Systems  Objective: Vital Signs: There were no vitals taken for this visit.  Specialty Comments:  No specialty comments available.  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   Elevated troponin 11/27/2015   Hydronephrosis, right 11/27/2015   SOB (shortness of breath)    Acute respiratory failure with hypoxia (HCC) 11/26/2015   Chronic kidney disease (CKD), stage III (moderate) (HCC) 11/26/2015   Acute on chronic diastolic  (congestive) heart failure (HCC)    Lobar pneumonia due to unspecified organism    Acute on chronic combined systolic (congestive) and diastolic (congestive) heart failure ([place]) 11/22/2015   Atrial fibrillation ([place]) 09/10/2013   Occlusion and stenosis of vertebral artery with cerebral infarction ([place]) 07/09/2013   Visual field loss 07/09/2013   HYPERLIPIDEMIA-MIXED 12/23/2009   CAD, NATIVE VESSEL 12/15/2009   ERECTILE DYSFUNCTION 12/10/2008   Essential hypertension 12/10/2008   FATIGUE 12/10/2008   Past Medical History:  Diagnosis Date   At risk for sleep apnea    STOP-BANG= 5   SENT TO PCP 05-20-2014   Bilateral carotid artery stenosis    mild bilateral proximcal ICA  40% per duplex 11/ 2014   Coronary artery disease    a. s/p CABG in 2007 w/ LIMA-LAD, SVG-D1, SVG-1st & 2nd Mrg, SVG-PDA and posterior lateral   ED (erectile dysfunction)    Eye infection 12/27/2014   GERD (gastroesophageal reflux disease)    History of embolic stroke no residual   11/ 2014  --  right PCA and MCA branch infarts (left side vision difficulties)   Hypertension    Other malaise and fatigue    Paroxysmal atrial fibrillation ([place])    Pneumonia 10/27/2015   led to CHF, in hospital x 6 days   S/P CABG x 6    2007   Ureteral obstruction, right     Family History  Problem Relation Age of Onset   Heart disease Mother        PPM   Hypertension Mother    Lymphoma Father    Prostate cancer Father    Prostate cancer Brother    Aneurysm Brother        REPAIR   CAD Brother        CABG   Other Brother        STENTS IN LEGS   Other Sister        STOMACH ISSUES   Depression Sister    Lung cancer Brother        Asbestosis    Past Surgical History:  Procedure Laterality Date   APPENDECTOMY  1950's   CARDIAC CATHETERIZATION  10-31-2005  dr wall   severe three vessel/  perserved LV   CARDIOVASCULAR STRESS TEST  last one 05-19-2014  dr Stanford Breed   low risk lexiscan no exercise study/ small  inferobasal infarct with no ischemia /  ef 53%   CARDIOVERSION N/A 10/03/2021   Procedure: CARDIOVERSION;  Surgeon: Werner Lean, MD;  Location: [facility] ENDOSCOPY;  Service: Cardiovascular;  Laterality: N/A;   CARDIOVERSION N/A 11/25/2021   Procedure: CARDIOVERSION;  Surgeon:  Janina Mayo, MD;  Location: [facility];  Service: Cardiovascular;  Laterality: N/A;   CARDIOVERSION N/A 12/28/2021   Procedure: CARDIOVERSION;  Surgeon: Berniece Salines, DO;  Location: [facility];  Service: Cardiovascular;  Laterality: N/A;   CARDIOVERSION N/A 08/31/2022   Procedure: CARDIOVERSION;  Surgeon: Sueanne Margarita, MD;  Location: [facility] ENDOSCOPY;  Service: Cardiovascular;  Laterality: N/A;   CORONARY ARTERY BYPASS GRAFT  11-01-2005  dr Roxy Manns   CLOSURE OF PFO/  LIMA to LAD,  SVG to D1,  SVG to 1st & 2nd  MARGINAL branches, SVG  to PDA and posterior lateral   CYSTOSCOPY W/ URETERAL STENT PLACEMENT Right 05/21/2014   Procedure: CYSTOSCOPY WITH RETROGRADE PYELOGRAM/URETERAL STENT PLACEMENT;  Surgeon: Ailene Rud, MD;  Location[facility];  Service: Urology;  Laterality: Right;   CYSTOSCOPY WITH URETEROSCOPY Right 05/21/2014   Procedure: CYSTOSCOPY WITH URETEROSCOPY;  Surgeon: Ailene Rud, MD;  Location: [facility];  Service: Urology;  Laterality: Right;   INGUINAL HERNIA REPAIR Bilateral    LAPAROSCOPIC CHOLECYSTECTOMY  10-02-2000   PACEMAKER IMPLANT N/A 10/11/2021   Procedure: PACEMAKER IMPLANT;  Surgeon: Evans Lance, MD;  Location: [facility] CV LAB;  Service: Cardiovascular;  Laterality: N/A;   RIGHT URETER OBSTRUCTION SURGERY  age 66   TEE WITHOUT CARDIOVERSION N/A 07/17/2013   Procedure: TRANSESOPHAGEAL ECHOCARDIOGRAM (TEE);  Surgeon: Josue Hector, MD;  Location: [facility] ENDOSCOPY;  Service: Cardiovascular;  Laterality: N/A;  normal LV size, mild prolapse of posterior mitral valve leaflet,  mild MR and TR, normal AV,  no LAA thrombus,  atrial septal aneurysm with  no PFO/ ASD and negative bubble study, normal RV, normal aorta with no debris   Social History   Occupational History   Occupation: retired  Tobacco Use   Smoking status: Former    Packs/day: 1.00    Years: 35.00    Total pack years: 35.00    Types: Cigarettes    Quit date: 08/29/1977    Years since quitting: 45.1   Smokeless tobacco: Never  Substance and Sexual Activity   Alcohol use: No    Alcohol/week: 0.0 standard drinks of alcohol   Drug use: No   Sexual activity: Not on file

## 2022-10-02 NOTE — Telephone Encounter (Signed)
Pt c/o medication issue:  1. Name of Medication:  empagliflozin (JARDIANCE) 10 MG TABS tablet   2. How are you currently taking this medication (dosage and times per day)? As prescribed  3. Are you having a reaction (difficulty breathing--STAT)? Yes  4. What is your medication issue? Patient c/o dizzy spells that started a couple weeks ago. He also reports he passed out Friday 02/02 at 3:30 am while traveling to the restroom from bed. Denys any symptoms at time of call.

## 2022-10-02 NOTE — Telephone Encounter (Signed)
Spoke with pt, Aware of dr Jacalyn Lefevre recommendations. He has a follow up appointment 11/01/22, he would like to wait until that appointment. Patient voiced understanding that he needs to call if happens again so he can be seen.

## 2022-10-02 NOTE — Telephone Encounter (Signed)
Patient states he notice since starting Jardiance  about 2 to 3 weeks . Notice he gets dizzy spells.  No particular reason for dizziness.   Patient states Friday around about  3:30 am  he felt "faint like" next htik he know he is picking himself up off the floor. Patient states he did not  hit anything in the fall.    Patient states he does not check his blood sugar. He does not check blood pressure regular but when he has  the range is in the 130's /70's .     He states he is well hydrated  - drinks only water.  Patient wanted to know if the Vania Rea is  the issue.   RN  informed patient will defer to Dr Pennie Banter for his response

## 2022-10-06 ENCOUNTER — Telehealth: Payer: Self-pay | Admitting: Cardiology

## 2022-10-06 NOTE — Addendum Note (Signed)
Addended by: Cristopher Estimable on: 10/06/2022 04:48 PM   Modules accepted: Orders

## 2022-10-06 NOTE — Telephone Encounter (Signed)
Left message for patient with Dr Creshaw's recommendations.   

## 2022-10-06 NOTE — Telephone Encounter (Signed)
Called patient to ask further questions about his reported blood pressures and symptoms. He is asking if he should reduce his blood pressure medication over the weekend and continue to track his blood pressure.  Patient states he had been feeling dizzy, lightheaded and weak at times for about a week so he started tracking his blood pressures as he had a fall on the way to the bathroom at 3:30 AM on 09/29/22. He was taken off of jardiance at that time.  Patient gave this information about his BP over the last 3 days: 10/06/22: 0900 sitting 92/53  0915 standing 81/47  10/05/22 0900 sitting 89/52  0915 standing 76/45  10/04/22 0900 sitting 141/75  0915 standing 139/72  He states he usually takes his morning BP meds at 6:30 AM and then checks his BP a few hours later. He takes coreg 3.125 mg BID, losartan 100 mg daily, torsemide 40 mg daily.  He also takes cialis 5 mg daily at bedtime. He states his dizziness/weakness/lightheadedness spells take a while to pass, even when he sits down and rests.   He also states he has had a cold for the last 3 days and has been taking robitussen.

## 2022-10-06 NOTE — Telephone Encounter (Signed)
Pt c/o BP issue: STAT if pt c/o blurred vision, one-sided weakness or slurred speech  1. What are your last 5 BP readings?  98/53 89/53 standing 105/61 134/74 114/67 89/52 92/53 $  2. Are you having any other symptoms (ex. Dizziness, headache, blurred vision, passed out)? Feels weak when his BP drops, lightheaded, legs feel weak.    3. What is your BP issue? BP keeps dropping.

## 2022-10-12 ENCOUNTER — Ambulatory Visit (INDEPENDENT_AMBULATORY_CARE_PROVIDER_SITE_OTHER): Payer: Medicare Other

## 2022-10-12 DIAGNOSIS — I255 Ischemic cardiomyopathy: Secondary | ICD-10-CM | POA: Diagnosis not present

## 2022-10-12 LAB — CUP PACEART REMOTE DEVICE CHECK
Battery Remaining Longevity: 147 mo
Battery Voltage: 3.05 V
Brady Statistic AP VP Percent: 28.35 %
Brady Statistic AP VS Percent: 70.72 %
Brady Statistic AS VP Percent: 0.13 %
Brady Statistic AS VS Percent: 0.8 %
Brady Statistic RA Percent Paced: 99.7 %
Brady Statistic RV Percent Paced: 28.48 %
Date Time Interrogation Session: 20240214193823
Implantable Lead Connection Status: 753985
Implantable Lead Connection Status: 753985
Implantable Lead Implant Date: 20230214
Implantable Lead Implant Date: 20230214
Implantable Lead Location: 753859
Implantable Lead Location: 753860
Implantable Lead Model: 3830
Implantable Lead Model: 5076
Implantable Pulse Generator Implant Date: 20230214
Lead Channel Impedance Value: 361 Ohm
Lead Channel Impedance Value: 380 Ohm
Lead Channel Impedance Value: 475 Ohm
Lead Channel Impedance Value: 513 Ohm
Lead Channel Pacing Threshold Amplitude: 0.5 V
Lead Channel Pacing Threshold Amplitude: 0.875 V
Lead Channel Pacing Threshold Pulse Width: 0.4 ms
Lead Channel Pacing Threshold Pulse Width: 0.4 ms
Lead Channel Sensing Intrinsic Amplitude: 16.125 mV
Lead Channel Sensing Intrinsic Amplitude: 16.125 mV
Lead Channel Sensing Intrinsic Amplitude: 3.875 mV
Lead Channel Sensing Intrinsic Amplitude: 3.875 mV
Lead Channel Setting Pacing Amplitude: 2 V
Lead Channel Setting Pacing Amplitude: 2 V
Lead Channel Setting Pacing Pulse Width: 0.4 ms
Lead Channel Setting Sensing Sensitivity: 0.9 mV
Zone Setting Status: 755011
Zone Setting Status: 755011

## 2022-10-18 NOTE — Progress Notes (Signed)
HPI: FU CAD and atrial fibrillation. Patient underwent coronary artery bypass and graft in 2007. He had a LIMA to the LAD, SVG to D1, sequential SVG to OM1 and OM2, sequential SVG to the PDA and posterior lateral. He also had closure of PFO. Abdominal ultrasound May 2014 showed no aneurysm. Had CVA in November of 2014. Transesophageal echocardiogram November 2014 showed normal LV function, mild prolapse of the posterior mitral valve leaflet and an atrial septal aneurysm with negative bubble study. Carotid Dopplers November 2014 showed less than 40% bilateral stenosis. 30 day event monitor showed atrial fibrillation. Nuclear study May 2017 showed ejection fraction 53%.  There was diaphragmatic attenuation but no ischemia. Echocardiogram February 2023 showed ejection fraction 45 to 50%, mild left atrial enlargement.  Patient had cardioversion October 03, 2021.  Subsequently noted to have symptomatic bradycardia.  Had pacemaker placed February 2023.  He was subsequently placed on amiodarone and underwent repeat cardioversion.  At last office visit patient was noted to be in recurrent atrial fibrillation.  He was symptomatic.  Patient seen by Dr. Myles Gip and felt not to be a good candidate for ablation.  Amiodarone was resumed with plans for repeat cardioversion.  Had repeat DCCV 08/31/22. FU echo 1/24 showed EF 25-30, mild LVE, moderate RVE, mild RV dysfunction, biatrial enlargement, moderate MR and TR. Since last seen, he has dyspnea with more vigorous activities but not routine activities.  He complains of feeling "sluggish".  There is no chest pain or syncope.  His pedal edema is controlled.  Current Outpatient Medications  Medication Sig Dispense Refill   amiodarone (PACERONE) 200 MG tablet Take 1 tablet (200 mg total) by mouth daily. 180 tablet 3   Bacitracin (ANTIBIOTIC EX) Apply 1 Application topically daily as needed (wound care).     carvedilol (COREG) 3.125 MG tablet TAKE 1 TABLET BY MOUTH TWICE A  DAY WITH MEALS 60 tablet 10   cholecalciferol (VITAMIN D) 25 MCG (1000 UNIT) tablet Take 1,000 Units by mouth in the morning.     COD LIVER OIL PO Take 1 capsule by mouth at bedtime.      ezetimibe (ZETIA) 10 MG tablet Take 10 mg by mouth every morning.     loratadine (CLARITIN) 10 MG tablet Take 10 mg by mouth daily as needed for allergies.     Multiple Vitamin (MULTIVITAMIN WITH MINERALS) TABS tablet Take 1 tablet by mouth in the morning.     omeprazole (PRILOSEC) 20 MG capsule Take 20 mg by mouth every other day. In the morning.     Polyethyl Glycol-Propyl Glycol (SYSTANE ULTRA OP) Place 1-2 drops into both eyes in the morning and at bedtime.     pravastatin (PRAVACHOL) 80 MG tablet Take 80 mg by mouth at bedtime.     saw palmetto 160 MG capsule Take 160 mg by mouth 2 (two) times daily.     TESTOSTERONE TD Place 1 mL onto the skin See admin instructions. 10% testosterone cream compounded at [facility]  - apply 1 ml topically to shoulders once every morning     torsemide (DEMADEX) 20 MG tablet Take 2 tablets (40 mg total) by mouth daily. 270 tablet 3   White Petrolatum (VASELINE EX) Apply 1 Application topically daily.     XARELTO 15 MG TABS tablet TAKE ONE (1) TABLET BY MOUTH EACH DAY WITH SUPPER 90 tablet 2   No current facility-administered medications for this visit.     Past Medical History:  Diagnosis Date  At risk for sleep apnea    STOP-BANG= 5   SENT TO PCP 05-20-2014   Bilateral carotid artery stenosis    mild bilateral proximcal ICA  40% per duplex 11/ 2014   Coronary artery disease    a. s/p CABG in 2007 w/ LIMA-LAD, SVG-D1, SVG-1st & 2nd Mrg, SVG-PDA and posterior lateral   ED (erectile dysfunction)    Eye infection 12/27/2014   GERD (gastroesophageal reflux disease)    History of embolic stroke no residual   11/ 2014  --  right PCA and MCA branch infarts (left side vision difficulties)   Hypertension    Other malaise and fatigue    Paroxysmal atrial  fibrillation ([place])    Pneumonia 10/27/2015   led to CHF, in hospital x 6 days   S/P CABG x 6    2007   Ureteral obstruction, right     Past Surgical History:  Procedure Laterality Date   APPENDECTOMY  1950's   CARDIAC CATHETERIZATION  10-31-2005  dr wall   severe three vessel/  perserved LV   CARDIOVASCULAR STRESS TEST  last one 05-19-2014  dr Stanford Breed   low risk lexiscan no exercise study/ small inferobasal infarct with no ischemia /  ef 53%   CARDIOVERSION N/A 10/03/2021   Procedure: CARDIOVERSION;  Surgeon: Werner Lean, MD;  Location: [facility] ENDOSCOPY;  Service: Cardiovascular;  Laterality: N/A;   CARDIOVERSION N/A 11/25/2021   Procedure: CARDIOVERSION;  Surgeon: Janina Mayo, MD;  Location: [facility] ENDOSCOPY;  Service: Cardiovascular;  Laterality: N/A;   CARDIOVERSION N/A 12/28/2021   Procedure: CARDIOVERSION;  Surgeon: Berniece Salines, DO;  Location: [facility];  Service: Cardiovascular;  Laterality: N/A;   CARDIOVERSION N/A 08/31/2022   Procedure: CARDIOVERSION;  Surgeon: Sueanne Margarita, MD;  Location: [facility] ENDOSCOPY;  Service: Cardiovascular;  Laterality: N/A;   CORONARY ARTERY BYPASS GRAFT  11-01-2005  dr Roxy Manns   CLOSURE OF PFO/  LIMA to LAD,  SVG to D1,  SVG to 1st & 2nd  MARGINAL branches, SVG  to PDA and posterior lateral   CYSTOSCOPY W/ URETERAL STENT PLACEMENT Right 05/21/2014   Procedure: CYSTOSCOPY WITH RETROGRADE PYELOGRAM/URETERAL STENT PLACEMENT;  Surgeon: Ailene Rud, MD;  Location: [facility];  Service: Urology;  Laterality: Right;   CYSTOSCOPY WITH URETEROSCOPY Right 05/21/2014   Procedure: CYSTOSCOPY WITH URETEROSCOPY;  Surgeon: Ailene Rud, MD;  Location: [facility];  Service: Urology;  Laterality: Right;   INGUINAL HERNIA REPAIR Bilateral    LAPAROSCOPIC CHOLECYSTECTOMY  10-02-2000   PACEMAKER IMPLANT N/A 10/11/2021   Procedure: PACEMAKER IMPLANT;  Surgeon: Evans Lance, MD;  Location: [facility] CV LAB;  Service:  Cardiovascular;  Laterality: N/A;   RIGHT URETER OBSTRUCTION SURGERY  age 76   TEE WITHOUT CARDIOVERSION N/A 07/17/2013   Procedure: TRANSESOPHAGEAL ECHOCARDIOGRAM (TEE);  Surgeon: Josue Hector, MD;  Location: [facility] ENDOSCOPY;  Service: Cardiovascular;  Laterality: N/A;  normal LV size, mild prolapse of posterior mitral valve leaflet,  mild MR and TR, normal AV,  no LAA thrombus,  atrial septal aneurysm with no PFO/ ASD and negative bubble study, normal RV, normal aorta with no debris    Social History   Socioeconomic History   Marital status: Married    Spouse name: maxine   Number of children: 4   Years of education: college4   Highest education level: Not on file  Occupational History   Occupation: retired  Tobacco Use   Smoking status: Former    Packs/day:  1.00    Years: 35.00    Total pack years: 35.00    Types: Cigarettes    Quit date: 08/29/1977    Years since quitting: 45.2   Smokeless tobacco: Never  Substance and Sexual Activity   Alcohol use: No    Alcohol/week: 0.0 standard drinks of alcohol   Drug use: No   Sexual activity: Not on file  Other Topics Concern   Not on file  Social History Narrative   Patient is married with 4 children.   Patient is right handed.   Patient has college education.   Patient drinks decaff coffee.   Social Determinants of Health   Financial Resource Strain: Not on file  Food Insecurity: Not on file  Transportation Needs: Not on file  Physical Activity: Not on file  Stress: Not on file  Social Connections: Not on file  Intimate Partner Violence: Not on file    Family History  Problem Relation Age of Onset   Heart disease Mother        PPM   Hypertension Mother    Lymphoma Father    Prostate cancer Father    Prostate cancer Brother    Aneurysm Brother        REPAIR   CAD Brother        CABG   Other Brother        STENTS IN LEGS   Other Sister        STOMACH ISSUES   Depression Sister    Lung cancer Brother         Asbestosis    ROS: no fevers or chills, productive cough, hemoptysis, dysphasia, odynophagia, melena, hematochezia, dysuria, hematuria, rash, seizure activity, orthopnea, PND, pedal edema, claudication. Remaining systems are negative.  Physical Exam: Well-developed well-nourished in no acute distress.  Skin is warm and dry.  HEENT is normal.  Neck is supple.  Chest is clear to auscultation with normal expansion.  Cardiovascular exam is regular rate and rhythm.  Abdominal exam nontender or distended. No masses palpated. Extremities show no edema. neuro grossly intact   A/P  1 paroxysmal atrial fibrillation-patient remains in atrial paced rhythm on most recent device interrogation.  Continue amiodarone.  Check TSH, liver functions and chest x-ray.  Previously evaluated by Dr. Myles Gip and felt not to be a candidate for ablation.  Continue Coreg and Xarelto.  Check hemoglobin and renal function.  2 chronic combined systolic/diastolic congestive heart failure-patient contacted the office recently with low blood pressure and his losartan was discontinued.  His Jardiance was also discontinued due to dizziness and low blood pressure.  I will resume to see if he tolerates.  He is not on spironolactone due to renal insufficiency.  Continue low-dose carvedilol.  Plan repeat echocardiogram in April.  Hopefully LV function will have improved now that sinus has been reestablished.  3 coronary artery disease-no chest pain.  Continue statin.  Is not on aspirin given need for anticoagulation.  4 hypertension-blood pressure controlled.  Continue present medical regimen.  5 hyperlipidemia-continue pravastatin and Zetia.  6 prior pacemaker-managed by electrophysiology.  7 chronic stage IIIb kidney disease-check potassium and renal function.  Follow-up nephrology.  Kirk Ruths, MD

## 2022-10-20 LAB — BASIC METABOLIC PANEL
BUN/Creatinine Ratio: 14 (ref 10–24)
BUN: 28 mg/dL — ABNORMAL HIGH (ref 8–27)
CO2: 24 mmol/L (ref 20–29)
Calcium: 9 mg/dL (ref 8.6–10.2)
Chloride: 100 mmol/L (ref 96–106)
Creatinine, Ser: 2.05 mg/dL — ABNORMAL HIGH (ref 0.76–1.27)
Glucose: 103 mg/dL — ABNORMAL HIGH (ref 70–99)
Potassium: 3.8 mmol/L (ref 3.5–5.2)
Sodium: 138 mmol/L (ref 134–144)
eGFR: 30 mL/min/{1.73_m2} — ABNORMAL LOW (ref >59)

## 2022-10-25 ENCOUNTER — Other Ambulatory Visit: Payer: Self-pay | Admitting: Nephrology

## 2022-10-25 DIAGNOSIS — N1832 Chronic kidney disease, stage 3b: Secondary | ICD-10-CM

## 2022-11-01 ENCOUNTER — Ambulatory Visit: Payer: Medicare Other | Attending: Cardiology | Admitting: Cardiology

## 2022-11-01 ENCOUNTER — Encounter: Payer: Self-pay | Admitting: Cardiology

## 2022-11-01 ENCOUNTER — Encounter: Payer: Self-pay | Admitting: *Deleted

## 2022-11-01 VITALS — BP 118/64 | HR 59 | Ht 69.0 in | Wt 168.1 lb

## 2022-11-01 DIAGNOSIS — Z950 Presence of cardiac pacemaker: Secondary | ICD-10-CM | POA: Insufficient documentation

## 2022-11-01 DIAGNOSIS — I480 Paroxysmal atrial fibrillation: Secondary | ICD-10-CM | POA: Insufficient documentation

## 2022-11-01 DIAGNOSIS — E7800 Pure hypercholesterolemia, unspecified: Secondary | ICD-10-CM | POA: Insufficient documentation

## 2022-11-01 DIAGNOSIS — I10 Essential (primary) hypertension: Secondary | ICD-10-CM | POA: Insufficient documentation

## 2022-11-01 DIAGNOSIS — I2510 Atherosclerotic heart disease of native coronary artery without angina pectoris: Secondary | ICD-10-CM | POA: Insufficient documentation

## 2022-11-01 MED ORDER — EMPAGLIFLOZIN 10 MG PO TABS: 10.0000 mg | ORAL_TABLET | Freq: Every day | ORAL | 11 refills | Status: DC

## 2022-11-01 NOTE — Patient Instructions (Signed)
Medication Instructions:   START JARDIANCE 10 MG ONCE DAILY  *If you need a refill on your cardiac medications before your next appointment, please call your pharmacy*   Lab Work:  Your physician recommends that you return for lab work in: [facility]  [place] on the 3 rd floor in [address] Hours-Monday - Friday 8 am-11:30 AM and 1 pm -4 pm   If you have labs (blood work) drawn today and your tests are completely normal, you will receive your results only by: MyChart Message (if you have MyChart) OR A paper copy in the mail If you have any lab test that is abnormal or we need to change your treatment, we will call you to review the results    Follow-Up: At [facility], you and your health needs are our priority.  As part of our continuing mission to provide you with exceptional heart care, we have created designated Provider Care Teams.  These Care Teams include your primary Cardiologist (physician) and Advanced Practice Providers (APPs -  Physician Assistants and Nurse Practitioners) who all work together to provide you with the care you need, when you need it.  We recommend signing up for the patient portal called "MyChart".  Sign up information is provided on this After Visit Summary.  MyChart is used to connect with patients for Virtual Visits (Telemedicine).  Patients are able to view lab/test results, encounter notes, upcoming appointments, etc.  Non-urgent messages can be sent to your provider as well.   To learn more about what you can do with MyChart, go to [email or web address].    Your next appointment:   3 month(s)  Provider:   Kirk Ruths, MD

## 2022-11-07 NOTE — Progress Notes (Signed)
Remote pacemaker transmission.   

## 2022-11-09 ENCOUNTER — Ambulatory Visit (HOSPITAL_BASED_OUTPATIENT_CLINIC_OR_DEPARTMENT_OTHER)
Admission: RE | Admit: 2022-11-09 | Discharge: 2022-11-09 | Disposition: A | Discharge status: Home / Self Care | Payer: Medicare Other | Source: Ambulatory Visit | Attending: Cardiology | Admitting: Cardiology

## 2022-11-09 DIAGNOSIS — I480 Paroxysmal atrial fibrillation: Secondary | ICD-10-CM | POA: Insufficient documentation

## 2022-11-10 LAB — COMPREHENSIVE METABOLIC PANEL
ALT: 26 IU/L (ref 0–44)
AST: 31 IU/L (ref 0–40)
Albumin/Globulin Ratio: 1.2 (ref 1.2–2.2)
Albumin: 4.1 g/dL (ref 3.7–4.7)
Alkaline Phosphatase: 119 IU/L (ref 44–121)
BUN/Creatinine Ratio: 14 (ref 10–24)
BUN: 36 mg/dL — ABNORMAL HIGH (ref 8–27)
Bilirubin Total: 0.7 mg/dL (ref 0.0–1.2)
CO2: 24 mmol/L (ref 20–29)
Calcium: 9.3 mg/dL (ref 8.6–10.2)
Chloride: 100 mmol/L (ref 96–106)
Creatinine, Ser: 2.5 mg/dL — ABNORMAL HIGH (ref 0.76–1.27)
Globulin, Total: 3.4 g/dL (ref 1.5–4.5)
Glucose: 90 mg/dL (ref 70–99)
Potassium: 3.6 mmol/L (ref 3.5–5.2)
Sodium: 139 mmol/L (ref 134–144)
Total Protein: 7.5 g/dL (ref 6.0–8.5)
eGFR: 24 mL/min/{1.73_m2} — ABNORMAL LOW (ref >59)

## 2022-11-10 LAB — CBC
Hematocrit: 47 % (ref 37.5–51.0)
Hemoglobin: 14.9 g/dL (ref 13.0–17.7)
MCH: 27.9 pg (ref 26.6–33.0)
MCHC: 31.7 g/dL (ref 31.5–35.7)
MCV: 88 fL (ref 79–97)
Platelets: 138 10*3/uL — ABNORMAL LOW (ref 150–450)
RBC: 5.34 x10E6/uL (ref 4.14–5.80)
RDW: 17.2 % — ABNORMAL HIGH (ref 11.6–15.4)
WBC: 5.7 10*3/uL (ref 3.4–10.8)

## 2022-11-10 LAB — LIPID PANEL
Chol/HDL Ratio: 2.5 ratio (ref 0.0–5.0)
Cholesterol, Total: 100 mg/dL (ref 100–199)
HDL: 40 mg/dL (ref >39)
LDL Chol Calc (NIH): 44 mg/dL (ref 0–99)
Triglycerides: 80 mg/dL (ref 0–149)
VLDL Cholesterol Cal: 16 mg/dL (ref 5–40)

## 2022-11-10 LAB — TSH: TSH: 1.36 u[IU]/mL (ref 0.450–4.500)

## 2022-11-15 ENCOUNTER — Ambulatory Visit: Payer: Medicare Other | Admitting: Cardiology

## 2022-11-21 ENCOUNTER — Ambulatory Visit
Admission: RE | Admit: 2022-11-21 | Discharge: 2022-11-21 | Disposition: A | Discharge status: Home / Self Care | Payer: Medicare Other | Source: Ambulatory Visit | Attending: Nephrology | Admitting: Nephrology

## 2022-11-21 DIAGNOSIS — N1832 Chronic kidney disease, stage 3b: Secondary | ICD-10-CM

## 2022-12-22 ENCOUNTER — Other Ambulatory Visit (HOSPITAL_COMMUNITY): Payer: Self-pay | Admitting: Urology

## 2022-12-22 ENCOUNTER — Encounter (HOSPITAL_COMMUNITY): Payer: Self-pay | Admitting: Urology

## 2022-12-22 DIAGNOSIS — Q6239 Other obstructive defects of renal pelvis and ureter: Secondary | ICD-10-CM

## 2023-01-01 ENCOUNTER — Ambulatory Visit (INDEPENDENT_AMBULATORY_CARE_PROVIDER_SITE_OTHER): Payer: Medicare Other | Admitting: Orthopedic Surgery

## 2023-01-01 DIAGNOSIS — L97521 Non-pressure chronic ulcer of other part of left foot limited to breakdown of skin: Secondary | ICD-10-CM | POA: Diagnosis not present

## 2023-01-01 DIAGNOSIS — B351 Tinea unguium: Secondary | ICD-10-CM

## 2023-01-08 ENCOUNTER — Encounter (HOSPITAL_COMMUNITY)
Admission: RE | Admit: 2023-01-08 | Discharge: 2023-01-08 | Disposition: A | Discharge status: Home / Self Care | Payer: Medicare Other | Source: Ambulatory Visit | Attending: Urology | Admitting: Urology

## 2023-01-08 DIAGNOSIS — Q6239 Other obstructive defects of renal pelvis and ureter: Secondary | ICD-10-CM | POA: Insufficient documentation

## 2023-01-08 MED ORDER — FUROSEMIDE 10 MG/ML IJ SOLN: 38.0000 mg | Freq: Once | INTRAMUSCULAR | Status: DC

## 2023-01-08 MED ORDER — FUROSEMIDE 10 MG/ML IJ SOLN
INTRAMUSCULAR | Status: AC
  Filled 2023-01-08: qty 4

## 2023-01-08 MED ORDER — TECHNETIUM TC 99M MERTIATIDE
5.0000 | Freq: Once | INTRAVENOUS | Status: AC | PRN
  Administered 2023-01-08: 5.3 via INTRAVENOUS

## 2023-01-11 ENCOUNTER — Ambulatory Visit (INDEPENDENT_AMBULATORY_CARE_PROVIDER_SITE_OTHER): Payer: Medicare Other

## 2023-01-11 DIAGNOSIS — I255 Ischemic cardiomyopathy: Secondary | ICD-10-CM

## 2023-01-11 LAB — CUP PACEART REMOTE DEVICE CHECK
Battery Remaining Longevity: 142 mo
Battery Voltage: 3.03 V
Brady Statistic AP VP Percent: 40.43 %
Brady Statistic AP VS Percent: 59.29 %
Brady Statistic AS VP Percent: 0.03 %
Brady Statistic AS VS Percent: 0.24 %
Brady Statistic RA Percent Paced: 99.88 %
Brady Statistic RV Percent Paced: 40.47 %
Date Time Interrogation Session: 20240516094325
Implantable Lead Connection Status: 753985
Implantable Lead Connection Status: 753985
Implantable Lead Implant Date: 20230214
Implantable Lead Implant Date: 20230214
Implantable Lead Location: 753859
Implantable Lead Location: 753860
Implantable Lead Model: 3830
Implantable Lead Model: 5076
Implantable Pulse Generator Implant Date: 20230214
Lead Channel Impedance Value: 304 Ohm
Lead Channel Impedance Value: 361 Ohm
Lead Channel Impedance Value: 494 Ohm
Lead Channel Impedance Value: 513 Ohm
Lead Channel Pacing Threshold Amplitude: 0.5 V
Lead Channel Pacing Threshold Amplitude: 0.875 V
Lead Channel Pacing Threshold Pulse Width: 0.4 ms
Lead Channel Pacing Threshold Pulse Width: 0.4 ms
Lead Channel Sensing Intrinsic Amplitude: 17 mV
Lead Channel Sensing Intrinsic Amplitude: 17 mV
Lead Channel Sensing Intrinsic Amplitude: 4.875 mV
Lead Channel Sensing Intrinsic Amplitude: 4.875 mV
Lead Channel Setting Pacing Amplitude: 2 V
Lead Channel Setting Pacing Amplitude: 2 V
Lead Channel Setting Pacing Pulse Width: 0.4 ms
Lead Channel Setting Sensing Sensitivity: 0.9 mV
Zone Setting Status: 755011
Zone Setting Status: 755011

## 2023-01-18 ENCOUNTER — Encounter: Payer: Self-pay | Admitting: Orthopedic Surgery

## 2023-01-18 LAB — LAB REPORT - SCANNED
Albumin, Urine POC: 4.1
Creatinine, POC: 2.43 mg/dL
EGFR: 25

## 2023-01-18 NOTE — Progress Notes (Signed)
Office Visit Note   Patient: Jose [name]           Date of Birth: 08/11/1931           MRN: [id number] Visit Date: 01/01/2023              Requested by: Mosetta Putt, MD [address] [place],  [place] [address] PCP: Mosetta Putt, MD  Chief Complaint  Patient presents with   Right Foot - Follow-up    Bilateral toenail trimming   Left Foot - Follow-up      HPI: Patient is a 89 year old gentleman who presents with history of calluses to the left foot as well as onychomycotic nails x 10.  Assessment & Plan: Visit Diagnoses:  1. Onychomycosis   2. Non-pressure chronic ulcer of other part of left foot limited to breakdown of skin (HCC)     Plan: Calluses pared nails trimmed x 10.  Follow-Up Instructions: Return in about 3 months (around 04/03/2023).   Ortho Exam  Patient is alert, oriented, no adenopathy, well-dressed, normal affect, normal respiratory effort. Examination patient has a hyperkeratotic lesion beneath the fourth metatarsal head.  After informed consent a 10 blade knife was used to debride the skin and soft tissue back to healthy viable tissue.  Patient has thickened discolored onychomycotic nails x 10 there is no sign of paronychial infection.  The nails were trimmed x 10 without complications.  Imaging: No results found. No images are attached to the encounter.  Labs: Lab Results  Component Value Date   HGBA1C 6.2 (H) 07/10/2013   ESRSEDRATE 12 01/13/2016   ESRSEDRATE 20 12/21/2015   ESRSEDRATE 94 (H) 11/26/2015   REPTSTATUS 11/24/2015 FINAL 11/24/2015   REPTSTATUS 11/28/2015 FINAL 11/24/2015   GRAMSTAIN  11/24/2015    ABUNDANT WBC PRESENT,BOTH PMN AND MONONUCLEAR FEW SQUAMOUS EPITHELIAL CELLS PRESENT MODERATE GRAM POSITIVE COCCI IN PAIRS MODERATE GRAM NEGATIVE RODS Performed at [facility]    CULT  11/24/2015    NORMAL OROPHARYNGEAL FLORA Performed at [facility]      Lab Results  Component Value Date   ALBUMIN  4.1 11/09/2022   ALBUMIN 4.0 11/08/2021   ALBUMIN 3.8 10/10/2021    Lab Results  Component Value Date   MG 2.0 10/10/2021   MG 1.6 (L) 11/22/2015   No results found for: "VD25OH"  No results found for: "PREALBUMIN"    Latest Ref Rng & Units 11/09/2022   10:16 AM 11/25/2021   10:08 AM 10/10/2021   10:20 AM  CBC EXTENDED  WBC 3.4 - 10.8 x10E3/uL 5.7   5.6   RBC 4.14 - 5.80 x10E6/uL 5.34   5.50   Hemoglobin 13.0 - 17.7 g/dL 19.1  47.8  29.5   HCT 37.5 - 51.0 % 47.0  49.0  50.3   Platelets 150 - 450 x10E3/uL 138   139   NEUT# 1.7 - 7.7 K/uL   3.7   Lymph# 0.7 - 4.0 K/uL   1.2      There is no height or weight on file to calculate BMI.  Orders:  No orders of the defined types were placed in this encounter.  No orders of the defined types were placed in this encounter.    Procedures: No procedures performed  Clinical Data: No additional findings.  ROS:  All other systems negative, except as noted in the HPI. Review of Systems  Objective: Vital Signs: There were no vitals taken for this visit.  Specialty Comments:  No  specialty comments available.  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   Elevated troponin 11/27/2015   Hydronephrosis, right 11/27/2015   SOB (shortness of breath)    Acute respiratory failure with hypoxia (HCC) 11/26/2015   Chronic kidney disease (CKD), stage III (moderate) (HCC) 11/26/2015   Acute on chronic diastolic (congestive) heart failure (HCC)    Lobar pneumonia due to unspecified organism    Acute on chronic combined systolic (congestive) and diastolic (congestive) heart failure (HCC) 11/22/2015    Atrial fibrillation (HCC) 09/10/2013   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 s/p CABG in 2007 w/ LIMA-LAD, SVG-D1, SVG-1st & 2nd Mrg, SVG-PDA and posterior lateral   ED (erectile dysfunction)    Eye infection 12/27/2014   GERD (gastroesophageal reflux disease)    History of embolic stroke no residual   09/ 2014  --  right PCA and MCA branch infarts (left side vision difficulties)   Hypertension    Other malaise and fatigue    Paroxysmal atrial fibrillation (HCC)    Pneumonia 10/27/2015   led to CHF, in hospital x 6 days   S/P CABG x 6    2007   Ureteral obstruction, right     Family History  Problem Relation Age of Onset   Heart disease Mother        PPM   Hypertension Mother    Lymphoma Father    Prostate cancer Father    Prostate cancer Brother    Aneurysm Brother        REPAIR   CAD Brother        CABG   Other Brother        STENTS IN LEGS   Other Sister        STOMACH ISSUES   Depression Sister    Lung cancer Brother        Asbestosis    Past Surgical History:  Procedure Laterality Date   APPENDECTOMY  1950's   CARDIAC CATHETERIZATION  10-31-2005  dr wall   severe three vessel/  perserved LV   CARDIOVASCULAR STRESS TEST  last one 05-19-2014  dr Jens Som   low risk lexiscan no exercise study/ small inferobasal infarct with no ischemia /  ef 53%   CARDIOVERSION N/A 10/03/2021   Procedure: CARDIOVERSION;  Surgeon: Christell Constant, MD;  Location: [facility] ENDOSCOPY;  Service: Cardiovascular;   Laterality: N/A;   CARDIOVERSION N/A 11/25/2021   Procedure: CARDIOVERSION;  Surgeon: Maisie Fus, MD;  Location: [facility] ENDOSCOPY;  Service: Cardiovascular;  Laterality: N/A;   CARDIOVERSION N/A 12/28/2021   Procedure: CARDIOVERSION;  Surgeon: Thomasene Ripple, DO;  Location: [facility] ENDOSCOPY;  Service: Cardiovascular;  Laterality: N/A;   CARDIOVERSION  N/A 08/31/2022   Procedure: CARDIOVERSION;  Surgeon: Quintella Reichert, MD;  Location: [facility] ENDOSCOPY;  Service: Cardiovascular;  Laterality: N/A;   CORONARY ARTERY BYPASS GRAFT  11-01-2005  dr Cornelius Moras   CLOSURE OF PFO/  LIMA to LAD,  SVG to D1,  SVG to 1st & 2nd  MARGINAL branches, SVG  to PDA and posterior lateral   CYSTOSCOPY W/ URETERAL STENT PLACEMENT Right 05/21/2014   Procedure: CYSTOSCOPY WITH RETROGRADE PYELOGRAM/URETERAL STENT PLACEMENT;  Surgeon: Kathi Ludwig, MD;  Location: [facility];  Service: Urology;  Laterality: Right;   CYSTOSCOPY WITH URETEROSCOPY Right 05/21/2014   Procedure: CYSTOSCOPY WITH URETEROSCOPY;  Surgeon: Kathi Ludwig, MD;  Location: [facility];  Service: Urology;  Laterality: Right;   INGUINAL HERNIA REPAIR Bilateral    LAPAROSCOPIC CHOLECYSTECTOMY  10-02-2000   PACEMAKER IMPLANT N/A 10/11/2021   Procedure: PACEMAKER IMPLANT;  Surgeon: Marinus Maw, MD;  Location: [facility];  Service: Cardiovascular;  Laterality: N/A;   RIGHT URETER OBSTRUCTION SURGERY  age 4   TEE WITHOUT CARDIOVERSION N/A 07/17/2013   Procedure: TRANSESOPHAGEAL ECHOCARDIOGRAM (TEE);  Surgeon: Wendall Stade, MD;  Location: [facility] ENDOSCOPY;  Service: Cardiovascular;  Laterality: N/A;  normal LV size, mild prolapse of posterior mitral valve leaflet,  mild MR and TR, normal AV,  no LAA thrombus,  atrial septal aneurysm with no PFO/ ASD and negative bubble study, normal RV, normal aorta with no debris   Social History   Occupational History   Occupation: retired  Tobacco Use   Smoking status: Former     Packs/day: 1.00    Years: 35.00    Additional pack years: 0.00    Total pack years: 35.00    Types: Cigarettes    Quit date: 08/29/1977    Years since quitting: 45.4   Smokeless tobacco: Never  Substance and Sexual Activity   Alcohol use: No    Alcohol/week: 0.0 standard drinks of alcohol   Drug use: No   Sexual activity: Not on file

## 2023-01-24 ENCOUNTER — Telehealth: Payer: Self-pay | Admitting: Cardiology

## 2023-01-24 NOTE — Telephone Encounter (Signed)
Spoke with pt, Aware of dr crenshaw's recommendations.  °

## 2023-01-24 NOTE — Telephone Encounter (Signed)
Pt c/o BP issue: STAT if pt c/o blurred vision, one-sided weakness or slurred speech  1. What are your last 5 BP readings?    2. Are you having any other symptoms (ex. Dizziness, headache, blurred vision, passed out)?  Dizziness  3. What is your BP issue?  BP has been elevated. Systolic is ranging 130-160

## 2023-01-24 NOTE — Telephone Encounter (Signed)
Called pt he states he thinks his blood pressure is low. "I get these spells where I get dizzy and lightheaded when standing." "It's not all the time just sometimes."  May: 150/85, 146/80, 122/70, 125/77, 141/81, 135/80, 136/82, 127/78, 136/79, 136/80, 98/58 (dizzy with standing), 89/57, 114/71, 105/57, 123/71, 80/52, 99/64. - "heart rate is normally around 60 bpm." "I think I might have to come off of the Jardiance according to my nephrologist. I'm not sure which doctor to call to address this situation." Pt states he feels okay right now, "it seems to happen around late morning."

## 2023-01-25 NOTE — Progress Notes (Signed)
Remote pacemaker transmission.   

## 2023-02-12 NOTE — Progress Notes (Deleted)
HPI: FU CAD and atrial fibrillation. Patient underwent coronary artery bypass and graft in 2007. He had a LIMA to the LAD, SVG to D1, sequential SVG to OM1 and OM2, sequential SVG to the PDA and posterior lateral. He also had closure of PFO. Abdominal ultrasound May 2014 showed no aneurysm. Had CVA in November of 2014. Transesophageal echocardiogram November 2014 showed normal LV function, mild prolapse of the posterior mitral valve leaflet and an atrial septal aneurysm with negative bubble study. Carotid Dopplers November 2014 showed less than 40% bilateral stenosis. 30 day event monitor showed atrial fibrillation. Nuclear study May 2017 showed ejection fraction 53%.  There was diaphragmatic attenuation but no ischemia. Echocardiogram February 2023 showed ejection fraction 45 to 50%, mild left atrial enlargement. Patient had cardioversion October 03, 2021.  Subsequently noted to have symptomatic bradycardia.  Had pacemaker placed February 2023.  He was subsequently placed on amiodarone and underwent repeat cardioversion.  At last office visit patient was noted to be in recurrent atrial fibrillation.  He was symptomatic.  Patient seen by Dr. Nelly Laurence and felt not to be a good candidate for ablation.  Amiodarone was resumed with plans for repeat cardioversion.  Had repeat DCCV 08/31/22. FU echo 1/24 showed EF 25-30, mild LVE, moderate RVE, mild RV dysfunction, biatrial enlargement, moderate MR and TR. renal ultrasound March 2024 showed severe hydronephrosis on the right.  Since last seen,   Current Outpatient Medications  Medication Sig Dispense Refill   amiodarone (PACERONE) 200 MG tablet Take 1 tablet (200 mg total) by mouth daily. 180 tablet 3   Bacitracin (ANTIBIOTIC EX) Apply 1 Application topically daily as needed (wound care).     carvedilol (COREG) 3.125 MG tablet TAKE 1 TABLET BY MOUTH TWICE A DAY WITH MEALS 60 tablet 10   cholecalciferol (VITAMIN D) 25 MCG (1000 UNIT) tablet Take 1,000 Units by  mouth in the morning.     COD LIVER OIL PO Take 1 capsule by mouth at bedtime.      ezetimibe (ZETIA) 10 MG tablet Take 10 mg by mouth every morning.     loratadine (CLARITIN) 10 MG tablet Take 10 mg by mouth daily as needed for allergies.     Multiple Vitamin (MULTIVITAMIN WITH MINERALS) TABS tablet Take 1 tablet by mouth in the morning.     omeprazole (PRILOSEC) 20 MG capsule Take 20 mg by mouth every other day. In the morning.     Polyethyl Glycol-Propyl Glycol (SYSTANE ULTRA OP) Place 1-2 drops into both eyes in the morning and at bedtime.     pravastatin (PRAVACHOL) 80 MG tablet Take 80 mg by mouth at bedtime.     saw palmetto 160 MG capsule Take 160 mg by mouth 2 (two) times daily.     TESTOSTERONE TD Place 1 mL onto the skin See admin instructions. 10% testosterone cream compounded at [facility]  - apply 1 ml topically to shoulders once every morning     torsemide (DEMADEX) 20 MG tablet Take 2 tablets (40 mg total) by mouth daily. 270 tablet 3   White Petrolatum (VASELINE EX) Apply 1 Application topically daily.     XARELTO 15 MG TABS tablet TAKE ONE (1) TABLET BY MOUTH EACH DAY WITH SUPPER 90 tablet 2   No current facility-administered medications for this visit.     Past Medical History:  Diagnosis Date   At risk for sleep apnea    STOP-BANG= 5   SENT TO PCP 05-20-2014   Bilateral  carotid artery stenosis    mild bilateral proximcal ICA  40% per duplex 11/ 2014   Coronary artery disease    a. s/p CABG in 2007 w/ LIMA-LAD, SVG-D1, SVG-1st & 2nd Mrg, SVG-PDA and posterior lateral   ED (erectile dysfunction)    Eye infection 12/27/2014   GERD (gastroesophageal reflux disease)    History of embolic stroke no residual   09/ 2014  --  right PCA and MCA branch infarts (left side vision difficulties)   Hypertension    Other malaise and fatigue    Paroxysmal atrial fibrillation (HCC)    Pneumonia 10/27/2015   led to CHF, in hospital x 6 days   S/P CABG x 6    2007    Ureteral obstruction, right     Past Surgical History:  Procedure Laterality Date   APPENDECTOMY  1950's   CARDIAC CATHETERIZATION  10-31-2005  dr wall   severe three vessel/  perserved LV   CARDIOVASCULAR STRESS TEST  last one 05-19-2014  dr Jens Som   low risk lexiscan no exercise study/ small inferobasal infarct with no ischemia /  ef 53%   CARDIOVERSION N/A 10/03/2021   Procedure: CARDIOVERSION;  Surgeon: Christell Constant, MD;  Location: [facility] ENDOSCOPY;  Service: Cardiovascular;  Laterality: N/A;   CARDIOVERSION N/A 11/25/2021   Procedure: CARDIOVERSION;  Surgeon: Maisie Fus, MD;  Location: [facility] ENDOSCOPY;  Service: Cardiovascular;  Laterality: N/A;   CARDIOVERSION N/A 12/28/2021   Procedure: CARDIOVERSION;  Surgeon: Thomasene Ripple, DO;  Location: [facility] ENDOSCOPY;  Service: Cardiovascular;  Laterality: N/A;   CARDIOVERSION N/A 08/31/2022   Procedure: CARDIOVERSION;  Surgeon: Quintella Reichert, MD;  Location: [facility] ENDOSCOPY;  Service: Cardiovascular;  Laterality: N/A;   CORONARY ARTERY BYPASS GRAFT  11-01-2005  dr Cornelius Moras   CLOSURE OF PFO/  LIMA to LAD,  SVG to D1,  SVG to 1st & 2nd  MARGINAL branches, SVG  to PDA and posterior lateral   CYSTOSCOPY W/ URETERAL STENT PLACEMENT Right 05/21/2014   Procedure: CYSTOSCOPY WITH RETROGRADE PYELOGRAM/URETERAL STENT PLACEMENT;  Surgeon: Kathi Ludwig, MD;  Location: [facility];  Service: Urology;  Laterality: Right;   CYSTOSCOPY WITH URETEROSCOPY Right 05/21/2014   Procedure: CYSTOSCOPY WITH URETEROSCOPY;  Surgeon: Kathi Ludwig, MD;  Location: [facility];  Service: Urology;  Laterality: Right;   INGUINAL HERNIA REPAIR Bilateral    LAPAROSCOPIC CHOLECYSTECTOMY  10-02-2000   PACEMAKER IMPLANT N/A 10/11/2021   Procedure: PACEMAKER IMPLANT;  Surgeon: Marinus Maw, MD;  Location: [facility] INVASIVE CV LAB;  Service: Cardiovascular;  Laterality: N/A;   RIGHT URETER OBSTRUCTION SURGERY  age 52   TEE WITHOUT CARDIOVERSION  N/A 07/17/2013   Procedure: TRANSESOPHAGEAL ECHOCARDIOGRAM (TEE);  Surgeon: Wendall Stade, MD;  Location: [facility] ENDOSCOPY;  Service: Cardiovascular;  Laterality: N/A;  normal LV size, mild prolapse of posterior mitral valve leaflet,  mild MR and TR, normal AV,  no LAA thrombus,  atrial septal aneurysm with no PFO/ ASD and negative bubble study, normal RV, normal aorta with no debris    Social History   Socioeconomic History   Marital status: Married    Spouse name: maxine   Number of children: 4   Years of education: college4   Highest education level: Not on file  Occupational History   Occupation: retired  Tobacco Use   Smoking status: Former    Packs/day: 1.00    Years: 35.00    Additional pack years: 0.00    Total pack years:  35.00    Types: Cigarettes    Quit date: 08/29/1977    Years since quitting: 45.4   Smokeless tobacco: Never  Substance and Sexual Activity   Alcohol use: No    Alcohol/week: 0.0 standard drinks of alcohol   Drug use: No   Sexual activity: Not on file  Other Topics Concern   Not on file  Social History Narrative   Patient is married with 4 children.   Patient is right handed.   Patient has college education.   Patient drinks decaff coffee.   Social Determinants of Health   Financial Resource Strain: Not on file  Food Insecurity: Not on file  Transportation Needs: Not on file  Physical Activity: Not on file  Stress: Not on file  Social Connections: Not on file  Intimate Partner Violence: Not on file    Family History  Problem Relation Age of Onset   Heart disease Mother        PPM   Hypertension Mother    Lymphoma Father    Prostate cancer Father    Prostate cancer Brother    Aneurysm Brother        REPAIR   CAD Brother        CABG   Other Brother        STENTS IN LEGS   Other Sister        STOMACH ISSUES   Depression Sister    Lung cancer Brother        Asbestosis    ROS: no fevers or chills, productive cough, hemoptysis,  dysphasia, odynophagia, melena, hematochezia, dysuria, hematuria, rash, seizure activity, orthopnea, PND, pedal edema, claudication. Remaining systems are negative.  Physical Exam: Well-developed well-nourished in no acute distress.  Skin is warm and dry.  HEENT is normal.  Neck is supple.  Chest is clear to auscultation with normal expansion.  Cardiovascular exam is regular rate and rhythm.  Abdominal exam nontender or distended. No masses palpated. Extremities show no edema. neuro grossly intact  ECG- personally reviewed  A/P  1 paroxysmal atrial fibrillation-patient remains in an atrial paced rhythm.  Continue amiodarone.  He has previously been evaluated by Dr. Nelly Laurence and felt not to be a candidate for ablation.  Continue carvedilol and Xarelto.  2 chronic combined systolic/diastolic congestive heart failure-plan to continue carvedilol.  Spironolactone was held previously due to renal insufficiency and losartan was discontinued due to hypotension.  Will plan repeat echocardiogram.  3 coronary artery disease-he denies chest pain.  Continue statin.  No aspirin given need for Xarelto.  4 hypertension-patient's blood pressure is controlled.  5 hyperlipidemia-continue pravastatin and Zetia.  6 pacemaker-followed by electrophysiology.  7 chronic stage IIIb kidney disease-  Olga Millers, MD

## 2023-02-21 ENCOUNTER — Ambulatory Visit: Payer: Medicare Other | Admitting: Cardiology

## 2023-03-26 ENCOUNTER — Ambulatory Visit: Payer: Medicare Other | Admitting: Orthopedic Surgery

## 2023-03-26 DIAGNOSIS — B351 Tinea unguium: Secondary | ICD-10-CM | POA: Diagnosis not present

## 2023-03-26 DIAGNOSIS — L97521 Non-pressure chronic ulcer of other part of left foot limited to breakdown of skin: Secondary | ICD-10-CM

## 2023-03-26 NOTE — Progress Notes (Unsigned)
Cardiology Clinic Note   Date: 03/27/2023 ID: TAYLER LEBRECHT, DOB March 20, 1931, MRN [id number]  Primary Cardiologist:  Olga Millers, MD  Patient Profile    CIRILO MORITA is a 89 y.o. male who presents to the clinic today for routine follow up.     Past medical history significant for: CAD. LHC 10/31/2005 (unstable angina): Severe three vessel CAD. Subtotal occlusion of OM2. Significant disease OM1, diagonal, and LAD. Significant diease of mid RCA. CTS consult.  CABG x 6 11/01/2005: LIMA to distal LAD, SVG to D1, sequential SVG to OM1 and OM 2, sequential SVG to PDA and RPL.  Closure of PFO. Nuclear stress test 01/26/2016: Medium defect of moderate severity present in the basal inferior, basal inferolateral, mid inferior and mid inferolateral location.  The defect is nonreversible.  In the setting of normal LVEF this is consistent with diaphragmatic attenuation artifact.  No ischemia is noted.  Low risk study. PAF/a-flutter.  Onset 2014. 30-day event monitor December 2014 rate controlled A-fib. Cardioversion 10/03/2021: Successful. Cardioversion 11/25/2021: Successful. Cardioversion 12/28/2021: Successful. Cardioversion 08/31/2022: Successful to A paced V sensed with PVCs. Complete heart block.  PPM placement 10/11/2021: Medtronic dual chamber pacemaker.  Remote device check 01/11/2023: Normal device function. Leads and battery stable.  Chronic combined systolic and diastolic heart failure. Echo 09/04/2022: EF 25-30%. RWMA. Akinesis of left ventricular, basal-mid anterolateral wall. Mildly reduced RV function. Severe LAE. Moderate RAE. Moderate TR. Aortic valve sclerosis without stenosis. Dilated IVC, RA pressure 8 mmHg. EF worsened from prior echo February 2023. Global hypokinesis and dyssynchrony with unchanged akinesis of the anterolateral wall.  Hypertension.  Hyperlipidemia.  CKD stage III.  CVA. November 2014.     History of Present Illness    Vinetta Bergamo a longtime patient of  cardiology.  He is followed by Dr. Jens Som and Dr. Nelly Laurence for the above outlined history.  Last office visit with Dr. Nelly Laurence 07/17/2022 for persistent A-fib. He inquired about stopping anticoagulation secondary to bleeding varicose veins that had since stopped bleeding after several sclerosis procedures.  He was not felt to be a good candidate for ablation secondary to previous PFO closure. Discussed continuing primary treatment for varicose veins as needed and remaining on anticoagulation.  He was started on amiodarone and planned to schedule cardioversion in a few weeks at office visit with Dr. Jens Som.   Patient was last seen in the office by Dr. Jens Som on 11/01/2022 for follow up. He reported dyspnea with heavier exertion but not routine activities. He complained of feeling sluggish. Chronic edema well managed. He was restarted on Jardiance. Plan for repeat echo in April to see if there is improvement of LV function post DCCV.   Patient contact the office on 01/24/2023 with complaints of orthostatic hypotension and dizziness. Per triage note "Called pt he states he thinks his blood pressure is low. "I get these spells where I get dizzy and lightheaded when standing." "It's not all the time just sometimes."  May: 150/85, 146/80, 122/70, 125/77, 141/81, 135/80, 136/82, 127/78, 136/79, 136/80, 98/58 (dizzy with standing), 89/57, 114/71, 105/57, 123/71, 80/52, 99/64. - "heart rate is normally around 60 bpm." "I think I might have to come off of the Jardiance according to my nephrologist. I'm not sure which doctor to call to address this situation." Pt states he feels okay right now, "it seems to happen around late morning."  Patient was instructed to stop Jardiance.   Today, patient is here alone. He reports dizziness has improved since stopping [place]  and Losartan. He still continues to have mild dizziness with position changes. Patient denies shortness of breath or dyspnea on exertion. No chest pain,  pressure, or tightness. Denies orthopnea or PND. Mild edema in bilateral knees with some stiffness. And occasional ankle edema that is at its best in the morning and progresses throughout the day. No palpitations. He has not noticed any fluttering in his chest related to afib since cardioversion in January. He stays active playing golf. He tries to stay well hydrated but states "I can probably do better." He does not wear compression socks. No blood in stool or urine or other bleeding concerns.       ROS: All other systems reviewed and are otherwise negative except as noted in History of Present Illness.  Studies Reviewed    EKG Interpretation Date/Time:  Tuesday March 27 2023 07:45:40 EDT Ventricular Rate:  60 PR Interval:  178 QRS Duration:  120 QT Interval:  480 QTC Calculation: 480 R Axis:   -50  Text Interpretation: AV dual-paced rhythm When compared with ECG of 31-Aug-2022 08:29, Premature ventricular complexes are no longer Present Vent. rate has decreased BY   2 BPM Confirmed by Carlos Levering [phone]) on 03/27/2023 7:51:18 AM    Risk Assessment/Calculations     CHA2DS2-VASc Score = 6   This indicates a 9.7% annual risk of stroke. The patient's score is based upon: CHF History: 1 HTN History: 1 Diabetes History: 0 Stroke History: 2 Vascular Disease History: 0 Age Score: 2 Gender Score: 0             Physical Exam    VS:  BP 132/78   Pulse 60   Ht 5\' 9"  (1.753 m)   Wt 177 lb (80.3 kg)   BMI 26.14 kg/m  , BMI Body mass index is 26.14 kg/m.  Orthostatic VS for the past 24 hrs (Last 3 readings):  BP- Lying Pulse- Lying BP- Sitting Pulse- Sitting BP- Standing at 0 minutes Pulse- Standing at 0 minutes BP- Standing at 3 minutes Pulse- Standing at 3 minutes  03/27/23 0805 128/64 60 98/53 60 104/47 58 117/57 59    GEN: Well nourished, well developed, in no acute distress. Neck: No JVD or carotid bruits. Cardiac:  RRR. No murmurs. No rubs or gallops.    Respiratory:  Respirations regular and unlabored. Clear to auscultation without rales, wheezing or rhonchi. GI: Soft, nontender, nondistended. Extremities: Radials/DP/PT 2+ and equal bilaterally. No clubbing or cyanosis. No edema.  Skin: Warm and dry, no rash. Neuro: Strength intact.  Assessment & Plan    CAD. S/p CABG x 31 October 2005. Low risk nuclear stress test May 2017. Patient denies chest pain, pressure, or tightness. Continue carvedilol, Zetia, Pravachol.  PAF. Onset 2014. DCCV February 2023, March 2023, May 2023, January 2024. Patient denies palpitations or fluttering sensation in chest. Denies spontaneous bleeding concerns. Continue amiodarone, carvedilol, Xarelto.   CHB. S/p PPM February 2023. Remote device check  May 2024 showed normal device function and stable leads and battery. Continue to follow with device clinic.  Chronic combined systolic and diastolic heart failure. Echo January 2024 showed EF 25-30%, mildly reduced RV function, BAE. LV function decreased from February 2023. Patient reports mild edema bilateral ankles that is at its best in the morning and progresses throughout the day. No orthopnea or PND.  Euvolemic and well compensated on exam. Continue torsemide, carvedilol. Patient was unable to tolerate Jardiance secondary to dizziness. Will get repeat echo to check on LV function  post DCCV.  Positional dizziness. Patient reports positional dizziness has improved since stopping Jardiance and Losartan. He still experiences some mild dizziness at times. No syncope. He has not fallen in 6 months.  Orthostatic vitals today shows a drop in SBP of 30 mmHg from laying to sitting that recovers with standing. Patient reported very mild lightheadedness. He is instructed to increase hydration, change positions slowly particularly with getting out of bed, and wear compression socks.  Hypertension: BP today 132/78. Patient denies headaches or vision changes. Continue carvedilol.     Disposition: Echo. Return in 4-6 months or sooner as needed.          Signed, Etta Grandchild. Bevan Vu, DNP, NP-C

## 2023-03-27 ENCOUNTER — Ambulatory Visit: Payer: Medicare Other | Attending: Cardiology | Admitting: Student

## 2023-03-27 ENCOUNTER — Encounter: Payer: Self-pay | Admitting: Student

## 2023-03-27 VITALS — BP 132/78 | HR 60 | Ht 69.0 in | Wt 177.0 lb

## 2023-03-27 DIAGNOSIS — I5042 Chronic combined systolic (congestive) and diastolic (congestive) heart failure: Secondary | ICD-10-CM | POA: Insufficient documentation

## 2023-03-27 DIAGNOSIS — I480 Paroxysmal atrial fibrillation: Secondary | ICD-10-CM | POA: Diagnosis present

## 2023-03-27 DIAGNOSIS — R42 Dizziness and giddiness: Secondary | ICD-10-CM | POA: Insufficient documentation

## 2023-03-27 DIAGNOSIS — I10 Essential (primary) hypertension: Secondary | ICD-10-CM | POA: Insufficient documentation

## 2023-03-27 DIAGNOSIS — I442 Atrioventricular block, complete: Secondary | ICD-10-CM | POA: Insufficient documentation

## 2023-03-27 DIAGNOSIS — Z950 Presence of cardiac pacemaker: Secondary | ICD-10-CM | POA: Insufficient documentation

## 2023-03-27 NOTE — Patient Instructions (Signed)
Medication Instructions:  Your physician recommends that you continue on your current medications as directed. Please refer to the Current Medication list given to you today. If you need a refill on your cardiac medications before your next appointment, please call your pharmacy   Lab Work: none If you have labs (blood work) drawn today and your tests are completely normal, you will receive your results only by: MyChart Message (if you have MyChart) OR A paper copy in the mail If you have any lab test that is abnormal or we need to change your treatment, we will call you to review the results.   Testing/Procedures: Your physician has requested that you have an echocardiogram. Echocardiography is a painless test that uses sound waves to create images of your heart. It provides your doctor with information about the size and shape of your heart and how well your heart's chambers and valves are working. This procedure takes approximately one hour. There are no restrictions for this procedure. Please do NOT wear cologne, aftershave, or lotions (deodorant is allowed). Please arrive 15 minutes prior to your appointment time.    Follow-Up: At [facility], you and your health needs are our priority.  As part of our continuing mission to provide you with exceptional heart care, we have created designated Provider Care Teams.  These Care Teams include your primary Cardiologist (physician) and Advanced Practice Providers (APPs -  Physician Assistants and Nurse Practitioners) who all work together to provide you with the care you need, when you need it.  We recommend signing up for the patient portal called "MyChart".  Sign up information is provided on this After Visit Summary.  MyChart is used to connect with patients for Virtual Visits (Telemedicine).  Patients are able to view lab/test results, encounter notes, upcoming appointments, etc.  Non-urgent messages can be sent to your provider as  well.   To learn more about what you can do with MyChart, go to [email or web address].    Your next appointment:   4-6 month(s)  Provider:   Olga Millers, MD or APP    Other Instructions INCREASE YOUR HYDRATION PRACTICE SLOW POSITION CHANGES

## 2023-04-02 ENCOUNTER — Ambulatory Visit: Payer: Medicare Other | Admitting: Orthopedic Surgery

## 2023-04-10 ENCOUNTER — Encounter: Payer: Self-pay | Admitting: Orthopedic Surgery

## 2023-04-10 NOTE — Progress Notes (Signed)
Office Visit Note   Patient: Jose [name]           Date of Birth: 30-Jul-1931           MRN: [id number] Visit Date: 03/26/2023              Requested by: Mosetta Putt, MD [address] [place],  [place] [address] PCP: Mosetta Putt, MD  Chief Complaint  Patient presents with   Right Foot - Follow-up    Nail trim    Left Foot - Follow-up      HPI: Patient is an [address].  89-year-old gentleman who presents with a Wagner grade 1 ulcer plantar aspect left foot as well as onychomycotic nails x 10.  Patient also has venous insufficiency  Assessment & Plan: Visit Diagnoses:  1. Onychomycosis   2. Non-pressure chronic ulcer of other part of left foot limited to breakdown of skin (HCC)     Plan: Recommended strengthening exercises for the left shoulder.  Nails were trimmed x 10.  Ulcer debrided x 1.  Follow-Up Instructions: Return in about 3 months (around 06/26/2023).   Ortho Exam  Patient is alert, oriented, no adenopathy, well-dressed, normal affect, normal respiratory effort. Examination patient has pain with internal and external rotation of the left shoulder.  He has venous insufficiency.  There is no pain with range of motion of the right hip.  Patient does have some tenderness in the hamstrings on the right negative straight leg raise.  Patient has thickened discolored onychomycotic nails x 10 he is unable to safely trim the nails on his own and nails were trimmed x 10 without complication.  Patient has a Wagner grade 1 ulcer beneath the fourth metatarsal head.  After informed consent a 10 blade knife was used to debride the skin and soft tissue back to healthy viable tissue.  Ulcer is 10 mm in diameter 1 mm deep after debridement.  Recommended Hoka sneakers to offload the forefoot.  Imaging: No results found. No images are attached to the encounter.  Labs: Lab Results  Component Value Date   HGBA1C 6.2 (H) 07/10/2013   ESRSEDRATE 12 01/13/2016   ESRSEDRATE 20  12/21/2015   ESRSEDRATE 94 (H) 11/26/2015   REPTSTATUS 11/24/2015 FINAL 11/24/2015   REPTSTATUS 11/28/2015 FINAL 11/24/2015   GRAMSTAIN  11/24/2015    ABUNDANT WBC PRESENT,BOTH PMN AND MONONUCLEAR FEW SQUAMOUS EPITHELIAL CELLS PRESENT MODERATE GRAM POSITIVE COCCI IN PAIRS MODERATE GRAM NEGATIVE RODS Performed at [facility]    CULT  11/24/2015    NORMAL OROPHARYNGEAL FLORA Performed at [facility]      Lab Results  Component Value Date   ALBUMIN 4.1 11/09/2022   ALBUMIN 4.0 11/08/2021   ALBUMIN 3.8 10/10/2021    Lab Results  Component Value Date   MG 2.0 10/10/2021   MG 1.6 (L) 11/22/2015   No results found for: "VD25OH"  No results found for: "PREALBUMIN"    Latest Ref Rng & Units 11/09/2022   10:16 AM 11/25/2021   10:08 AM 10/10/2021   10:20 AM  CBC EXTENDED  WBC 3.4 - 10.8 x10E3/uL 5.7   5.6   RBC 4.14 - 5.80 x10E6/uL 5.34   5.50   Hemoglobin 13.0 - 17.7 g/dL 69.6  29.5  28.4   HCT 37.5 - 51.0 % 47.0  49.0  50.3   Platelets 150 - 450 x10E3/uL 138   139   NEUT# 1.7 - 7.7 K/uL   3.7   Lymph#  0.7 - 4.0 K/uL   1.2      There is no height or weight on file to calculate BMI.  Orders:  No orders of the defined types were placed in this encounter.  No orders of the defined types were placed in this encounter.    Procedures: No procedures performed  Clinical Data: No additional findings.  ROS:  All other systems negative, except as noted in the HPI. Review of Systems  Objective: Vital Signs: There were no vitals taken for this visit.  Specialty Comments:  No specialty comments available.  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   Elevated troponin 11/27/2015   Hydronephrosis, right 11/27/2015   SOB (shortness of breath)    Acute respiratory failure with hypoxia (HCC) 11/26/2015   Chronic kidney disease (CKD), stage III (moderate) (HCC) 11/26/2015   Acute on chronic diastolic (congestive) heart failure (HCC)    Lobar pneumonia due to unspecified organism    Acute on chronic combined systolic (congestive) and diastolic (congestive) heart failure (HCC) 11/22/2015   Atrial fibrillation (HCC) 09/10/2013   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 s/p CABG in 2007 w/ LIMA-LAD, SVG-D1, SVG-1st & 2nd Mrg, SVG-PDA and posterior lateral   ED (erectile dysfunction)    Eye infection 12/27/2014   GERD (gastroesophageal reflux disease)    History of embolic stroke no residual   16/ 2014  --  right PCA and MCA branch infarts (left side vision difficulties)   Hypertension    Other malaise and fatigue    Paroxysmal atrial fibrillation (HCC)    Pneumonia 10/27/2015   led to CHF, in hospital x 6 days   S/P CABG x 6    2007   Ureteral obstruction, right     Family History  Problem Relation Age of Onset   Heart disease Mother        PPM   Hypertension Mother    Lymphoma Father    Prostate cancer Father    Prostate cancer Brother    Aneurysm Brother        REPAIR   CAD Brother        CABG   Other Brother        STENTS IN LEGS   Other Sister        STOMACH ISSUES   Depression Sister    Lung cancer Brother         Asbestosis    Past Surgical History:  Procedure Laterality Date   APPENDECTOMY  1950's   CARDIAC CATHETERIZATION  10-31-2005  dr wall   severe three vessel/  perserved LV   CARDIOVASCULAR STRESS TEST  last one 05-19-2014  dr Jens Som  low risk lexiscan no exercise study/ small inferobasal infarct with no ischemia /  ef 53%   CARDIOVERSION N/A 10/03/2021   Procedure: CARDIOVERSION;  Surgeon: Christell Constant, MD;  Location: [facility] ENDOS[facility];  Service: Cardiovascular;  Laterality: N/A;   CARDIOVERSION N/A 11/25/2021   Procedure: CARDIOVERSION;  Surgeon: Maisie Fus, MD;  Location: [facility] ENDOSCOPY;  Service: Cardiovascular;  Laterality: N/A;   CARDIOVERSION N/A 12/28/2021   Procedure: CARDIOVERSION;  Surgeon: Thomasene Ripple, DO;  Location: [facility] ENDOSCOPY;  Service: Cardiovascular;  Laterality: N/A;   CARDIOVERSION N/A 08/31/2022   Procedure: CARDIOVERSION;  Surgeon: Quintella Reichert, MD;  Location: [facility] ENDOSCOPY;  Service: Cardiovascular;  Laterality: N/A;   CORONARY ARTERY BYPASS GRAFT  11-01-2005  dr Cornelius Moras   CLOSURE OF PFO/  LIMA to LAD,  SVG to D1,  SVG to 1st & 2nd  MARGINAL branches, SVG  to PDA and posterior lateral   CYSTOSCOPY W/ URETERAL STENT PLACEMENT Right 05/21/2014   Procedure: CYSTOSCOPY WITH RETROGRADE PYELOGRAM/URETERAL STENT PLACEMENT;  Surgeon: Kathi Ludwig, MD;  Location: [facility];  Service: Urology;  Laterality: Right;   CYSTOSCOPY WITH URETEROSCOPY Right 05/21/2014   Procedure: CYSTOSCOPY WITH URETEROSCOPY;  Surgeon: Kathi Ludwig, MD;  Location: [facility];  Service: Urology;  Laterality: Right;   INGUINAL HERNIA REPAIR Bilateral    LAPAROSCOPIC CHOLECYSTECTOMY  10-02-2000   PACEMAKER IMPLANT N/A 10/11/2021   Procedure: PACEMAKER IMPLANT;  Surgeon: Marinus Maw, MD;  Location: [facility];  Service: Cardiovascular;  Laterality: N/A;   RIGHT URETER OBSTRUCTION SURGERY  age 54   TEE WITHOUT CARDIOVERSION N/A 07/17/2013    Procedure: TRANSESOPHAGEAL ECHOCARDIOGRAM (TEE);  Surgeon: Wendall Stade, MD;  Location: [facility] ENDOSCOPY;  Service: Cardiovascular;  Laterality: N/A;  normal LV size, mild prolapse of posterior mitral valve leaflet,  mild MR and TR, normal AV,  no LAA thrombus,  atrial septal aneurysm with no PFO/ ASD and negative bubble study, normal RV, normal aorta with no debris   Social History   Occupational History   Occupation: retired  Tobacco Use   Smoking status: Former    Current packs/day: 0.00    Average packs/day: 1 pack/day for 35.0 years (35.0 ttl pk-yrs)    Types: Cigarettes    Start date: 08/29/1942    Quit date: 08/29/1977    Years since quitting: 45.6   Smokeless tobacco: Never  Substance and Sexual Activity   Alcohol use: No    Alcohol/week: 0.0 standard drinks of alcohol   Drug use: No   Sexual activity: Not on file

## 2023-04-12 ENCOUNTER — Ambulatory Visit (INDEPENDENT_AMBULATORY_CARE_PROVIDER_SITE_OTHER): Payer: Medicare Other

## 2023-04-12 DIAGNOSIS — I255 Ischemic cardiomyopathy: Secondary | ICD-10-CM | POA: Diagnosis not present

## 2023-04-12 LAB — CUP PACEART REMOTE DEVICE CHECK
Battery Remaining Longevity: 137 mo
Battery Voltage: 3.03 V
Brady Statistic AP VP Percent: 73.94 %
Brady Statistic AP VS Percent: 25.73 %
Brady Statistic AS VP Percent: 0.03 %
Brady Statistic AS VS Percent: 0.29 %
Brady Statistic RA Percent Paced: 99.94 %
Brady Statistic RV Percent Paced: 73.97 %
Date Time Interrogation Session: 20240814232249
Implantable Lead Connection Status: 753985
Implantable Lead Connection Status: 753985
Implantable Lead Implant Date: 20230214
Implantable Lead Implant Date: 20230214
Implantable Lead Location: 753859
Implantable Lead Location: 753860
Implantable Lead Model: 3830
Implantable Lead Model: 5076
Implantable Pulse Generator Implant Date: 20230214
Lead Channel Impedance Value: 304 Ohm
Lead Channel Impedance Value: 361 Ohm
Lead Channel Impedance Value: 494 Ohm
Lead Channel Impedance Value: 494 Ohm
Lead Channel Pacing Threshold Amplitude: 0.5 V
Lead Channel Pacing Threshold Amplitude: 0.75 V
Lead Channel Pacing Threshold Pulse Width: 0.4 ms
Lead Channel Pacing Threshold Pulse Width: 0.4 ms
Lead Channel Sensing Intrinsic Amplitude: 13.75 mV
Lead Channel Sensing Intrinsic Amplitude: 13.75 mV
Lead Channel Sensing Intrinsic Amplitude: 4.875 mV
Lead Channel Sensing Intrinsic Amplitude: 4.875 mV
Lead Channel Setting Pacing Amplitude: 2 V
Lead Channel Setting Pacing Amplitude: 2 V
Lead Channel Setting Pacing Pulse Width: 0.4 ms
Lead Channel Setting Sensing Sensitivity: 0.9 mV
Zone Setting Status: 755011
Zone Setting Status: 755011

## 2023-04-24 NOTE — Progress Notes (Signed)
Remote pacemaker transmission.   

## 2023-05-22 ENCOUNTER — Other Ambulatory Visit: Payer: Self-pay | Admitting: Family Medicine

## 2023-05-22 DIAGNOSIS — R911 Solitary pulmonary nodule: Secondary | ICD-10-CM

## 2023-06-08 ENCOUNTER — Ambulatory Visit
Admission: RE | Admit: 2023-06-08 | Discharge: 2023-06-08 | Disposition: A | Discharge status: Home / Self Care | Payer: Medicare Other | Source: Ambulatory Visit | Attending: Family Medicine | Admitting: Family Medicine

## 2023-06-08 DIAGNOSIS — R911 Solitary pulmonary nodule: Secondary | ICD-10-CM

## 2023-06-11 ENCOUNTER — Ambulatory Visit (HOSPITAL_COMMUNITY): Payer: Medicare Other | Attending: Student

## 2023-06-11 DIAGNOSIS — I5042 Chronic combined systolic (congestive) and diastolic (congestive) heart failure: Secondary | ICD-10-CM | POA: Diagnosis present

## 2023-06-11 LAB — ECHOCARDIOGRAM COMPLETE
Area-P 1/2: 3.1 cm2
S' Lateral: 4.6 cm

## 2023-06-26 ENCOUNTER — Ambulatory Visit (INDEPENDENT_AMBULATORY_CARE_PROVIDER_SITE_OTHER): Payer: Medicare Other | Admitting: Orthopedic Surgery

## 2023-06-26 ENCOUNTER — Encounter: Payer: Self-pay | Admitting: Orthopedic Surgery

## 2023-06-26 DIAGNOSIS — L97521 Non-pressure chronic ulcer of other part of left foot limited to breakdown of skin: Secondary | ICD-10-CM | POA: Diagnosis not present

## 2023-06-26 DIAGNOSIS — M7541 Impingement syndrome of right shoulder: Secondary | ICD-10-CM

## 2023-06-26 DIAGNOSIS — B351 Tinea unguium: Secondary | ICD-10-CM

## 2023-06-26 NOTE — Progress Notes (Addendum)
Office Visit Note   Patient: Jose [name]           Date of Birth: [birth year]           MRN: [id number] Visit Date: 06/26/2023              Requested by: Mosetta Putt, MD [address] [place],  [place] [address] PCP: Mosetta Putt, MD  Chief Complaint  Patient presents with   Left Foot - Follow-up      HPI: Patient is a 89 year old gentleman who presents for 3 separate issues.  Patient is having pain with abduction and flexion of the right shoulder.  He has recurrent ulcer on the plantar aspect of the left foot and has painful onychomycotic nails.  Assessment & Plan: Visit Diagnoses:  1. Onychomycosis   2. Impingement syndrome of right shoulder     Plan: Nails were trimmed x 10 ulcer debrided x 1.  Right shoulder was injected.  Follow-Up Instructions: Return in about 3 months (around 09/26/2023).   Ortho Exam  Patient is alert, oriented, no adenopathy, well-dressed, normal affect, normal respiratory effort. Examination of the right shoulder patient has active abduction and flexion to 90 degrees.  He does have full passive range of motion.  He has pain with Neer and Hawkins impingement test.  Pain to palpation of the biceps tendon.  Examination of the left foot patient has a Wagner grade 1 ulcer beneath the third metatarsal head.  After informed consent a 10 blade knife was used to debride the skin and soft tissue back to healthy viable granulation tissue.  The wound is 1 cm in diameter after debridement.  Patient has thickened discolored onychomycotic nails x 10 he is unable to safely trim the nails on his own and the nails were trimmed x 10 without complication.  Imaging: No results found. No images are attached to the encounter.  Labs: Lab Results  Component Value Date   HGBA1C 6.2 (H) 07/10/2013   ESRSEDRATE 12 01/13/2016   ESRSEDRATE 20 12/21/2015   ESRSEDRATE 94 (H) 11/26/2015   REPTSTATUS 11/24/2015 FINAL 11/24/2015   REPTSTATUS 11/28/2015 FINAL  11/24/2015   GRAMSTAIN  11/24/2015    ABUNDANT WBC PRESENT,BOTH PMN AND MONONUCLEAR FEW SQUAMOUS EPITHELIAL CELLS PRESENT MODERATE GRAM POSITIVE COCCI IN PAIRS MODERATE GRAM NEGATIVE RODS Performed at [facility]    CULT  11/24/2015    NORMAL OROPHARYNGEAL FLORA Performed at [facility]      Lab Results  Component Value Date   ALBUMIN 4.1 11/09/2022   ALBUMIN 4.0 11/08/2021   ALBUMIN 3.8 10/10/2021    Lab Results  Component Value Date   MG 2.0 10/10/2021   MG 1.6 (L) 11/22/2015   No results found for: "VD25OH"  No results found for: "PREALBUMIN"    Latest Ref Rng & Units 11/09/2022   10:16 AM 11/25/2021   10:08 AM 10/10/2021   10:20 AM  CBC EXTENDED  WBC 3.4 - 10.8 x10E3/uL 5.7   5.6   RBC 4.14 - 5.80 x10E6/uL 5.34   5.50   Hemoglobin 13.0 - 17.7 g/dL 27.2  53.6  64.4   HCT 37.5 - 51.0 % 47.0  49.0  50.3   Platelets 150 - 450 x10E3/uL 138   139   NEUT# 1.7 - 7.7 K/uL   3.7   Lymph# 0.7 - 4.0 K/uL   1.2      There is no height or weight on file to calculate BMI.  Orders:  No orders of the defined types were placed in this encounter.  No orders of the defined types were placed in this encounter.    Procedures: No procedures performed  Clinical Data: No additional findings.  ROS:  All other systems negative, except as noted in the HPI. Review of Systems  Objective: Vital Signs: There were no vitals taken for this visit.  Specialty Comments:  No specialty comments available.  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   Elevated troponin 11/27/2015   Hydronephrosis, right 11/27/2015   SOB  (shortness of breath)    Acute respiratory failure with hypoxia (HCC) 11/26/2015   Chronic kidney disease (CKD), stage III (moderate) (HCC) 11/26/2015   Acute on chronic diastolic (congestive) heart failure (HCC)    Lobar pneumonia (HCC)    Acute on chronic combined systolic (congestive) and diastolic (congestive) heart failure (HCC) 11/22/2015   Atrial fibrillation (HCC) 09/10/2013   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 s/p CABG in 2007 w/ LIMA-LAD, SVG-D1, SVG-1st & 2nd Mrg, SVG-PDA and posterior lateral   ED (erectile dysfunction)    Eye infection 12/27/2014   GERD (gastroesophageal reflux disease)    History of embolic stroke no residual   82/ 2014  --  right PCA and MCA branch infarts (left side vision difficulties)   Hypertension    Other malaise and fatigue    Paroxysmal atrial fibrillation (HCC)    Pneumonia 10/27/2015   led to CHF, in hospital x 6 days   S/P CABG x 6    2007   Ureteral obstruction, right     Family History  Problem Relation Age of Onset   Heart disease Mother        PPM   Hypertension Mother    Lymphoma Father    Prostate cancer Father    Prostate cancer Brother    Aneurysm Brother        REPAIR   CAD Brother        CABG   Other Brother        STENTS IN LEGS   Other Sister        STOMACH ISSUES   Depression Sister    Lung cancer Brother        Asbestosis    Past Surgical History:  Procedure Laterality Date   APPENDECTOMY  1950's   CARDIAC CATHETERIZATION  10-31-2005  dr  wall   severe three vessel/  perserved LV   CARDIOVASCULAR STRESS TEST  last one 05-19-2014  dr Jens Som   low risk lexiscan no exercise study/ small inferobasal infarct with no ischemia /  ef 53%   CARDIOVERSION N/A 10/03/2021   Procedure: CARDIOVERSION;  Surgeon:  Christell Constant, MD;  Location: [facility] ENDOSCOPY;  Service: Cardiovascular;  Laterality: N/A;   CARDIOVERSION N/A 11/25/2021   Procedure: CARDIOVERSION;  Surgeon: Maisie Fus, MD;  Location: [facility] ENDOSCOPY;  Service: Cardiovascular;  Laterality: N/A;   CARDIOVERSION N/A 12/28/2021   Procedure: CARDIOVERSION;  Surgeon: Thomasene Ripple, DO;  Location: [facility] ENDOSCOPY;  Service: Cardiovascular;  Laterality: N/A;   CARDIOVERSION N/A 08/31/2022   Procedure: CARDIOVERSION;  Surgeon: Quintella Reichert, MD;  Location: [facility] ENDOSCOPY;  Service: Cardiovascular;  Laterality: N/A;   CORONARY ARTERY BYPASS GRAFT  11-01-2005  dr Cornelius Moras   CLOSURE OF PFO/  LIMA to LAD,  SVG to D1,  SVG to 1st & 2nd  MARGINAL branches, SVG  to PDA and posterior lateral   CYSTOSCOPY W/ URETERAL STENT PLACEMENT Right 05/21/2014   Procedure: CYSTOSCOPY WITH RETROGRADE PYELOGRAM/URETERAL STENT PLACEMENT;  Surgeon: Kathi Ludwig, MD;  Location: [facility];  Service: Urology;  Laterality: Right;   CYSTOSCOPY WITH URETEROSCOPY Right 05/21/2014   Procedure: CYSTOSCOPY WITH URETEROSCOPY;  Surgeon: Kathi Ludwig, MD;  Location: [facility];  Service: Urology;  Laterality: Right;   INGUINAL HERNIA REPAIR Bilateral    LAPAROSCOPIC CHOLECYSTECTOMY  10-02-2000   PACEMAKER IMPLANT N/A 10/11/2021   Procedure: PACEMAKER IMPLANT;  Surgeon: Marinus Maw, MD;  Location: [facility] CV [facility];  Service: Cardiovascular;  Laterality: N/A;   RIGHT URETER OBSTRUCTION SURGERY  age 89   TEE WITHOUT CARDIOVERSION N/A 07/17/2013   Procedure: TRANSESOPHAGEAL ECHOCARDIOGRAM (TEE);  Surgeon: Wendall Stade, MD;  Location: [facility] ENDOSCOPY;  Service: Cardiovascular;   Laterality: N/A;  normal LV size, mild prolapse of posterior mitral valve leaflet,  mild MR and TR, normal AV,  no LAA thrombus,  atrial septal aneurysm with no PFO/ ASD and negative bubble study, normal RV, normal aorta with no debris   Social History   Occupational History   Occupation: retired  Tobacco Use   Smoking status: Former    Current packs/day: 0.00    Average packs/day: 1 pack/day for 35.0 years (35.0 ttl pk-yrs)    Types: Cigarettes    Start date: 08/29/1942    Quit date: 08/29/1977    Years since quitting: 45.8   Smokeless tobacco: Never  Substance and Sexual Activity   Alcohol use: No    Alcohol/week: 0.0 standard drinks of alcohol   Drug use: No   Sexual activity: Not on file

## 2023-07-02 NOTE — Progress Notes (Signed)
HPI: FU CAD and atrial fibrillation. Patient underwent coronary artery bypass and graft in 2007. He had a LIMA to the LAD, SVG to D1, sequential SVG to OM1 and OM2, sequential SVG to the PDA and posterior lateral. He also had closure of PFO. Abdominal ultrasound May 2014 showed no aneurysm. Had CVA in November of 2014. Transesophageal echocardiogram November 2014 showed normal LV function, mild prolapse of the posterior mitral valve leaflet and an atrial septal aneurysm with negative bubble study. Carotid Dopplers November 2014 showed less than 40% bilateral stenosis. 30 day event monitor showed atrial fibrillation. Nuclear study May 2017 showed ejection fraction 53%.  There was diaphragmatic attenuation but no ischemia.  Had pacemaker placed previously secondary to symptomatic bradycardia.  Also has had recurrent atrial fibrillation on amiodarone.  Patient seen by Dr. Nelly Laurence and felt not to be a good candidate for ablation.  Amiodarone was resumed. Had repeat DCCV 08/31/22. FU echocardiogram October 2024 showed ejection fraction 35 to 40%, mild left ventricular enlargement, mild left ventricular hypertrophy, mild mitral regurgitation.  Chest CT October 2024 showed 1.3 cm right upper lobe pulmonary nodule concerning for neoplasm.  Follow-up recommended 3 months.  Since last seen, he has some dyspnea on exertion but no orthopnea, PND, chest pain or syncope.  Current Outpatient Medications  Medication Sig Dispense Refill   amiodarone (PACERONE) 200 MG tablet Take 1 tablet (200 mg total) by mouth daily. 180 tablet 3   Bacitracin (ANTIBIOTIC EX) Apply 1 Application topically daily as needed (wound care).     carvedilol (COREG) 3.125 MG tablet TAKE 1 TABLET BY MOUTH TWICE A DAY WITH MEALS 60 tablet 10   cholecalciferol (VITAMIN D) 25 MCG (1000 UNIT) tablet Take 1,000 Units by mouth in the morning.     COD LIVER OIL PO Take 1 capsule by mouth at bedtime.      ezetimibe (ZETIA) 10 MG tablet Take 10 mg by  mouth every morning.     famotidine (PEPCID) 40 MG tablet Take 40 mg by mouth daily.     loratadine (CLARITIN) 10 MG tablet Take 10 mg by mouth daily as needed for allergies.     Multiple Vitamin (MULTIVITAMIN WITH MINERALS) TABS tablet Take 1 tablet by mouth in the morning.     Polyethyl Glycol-Propyl Glycol (SYSTANE ULTRA OP) Place 1-2 drops into both eyes in the morning and at bedtime.     pravastatin (PRAVACHOL) 80 MG tablet Take 80 mg by mouth at bedtime.     saw palmetto 160 MG capsule Take 160 mg by mouth 2 (two) times daily.     TESTOSTERONE TD Place 1 mL onto the skin See admin instructions. 10% testosterone cream compounded at [facility]  - apply 1 ml topically to shoulders once every morning     torsemide (DEMADEX) 20 MG tablet Take 2 tablets (40 mg total) by mouth daily. 270 tablet 3   White Petrolatum (VASELINE EX) Apply 1 Application topically daily.     XARELTO 15 MG TABS tablet TAKE ONE (1) TABLET BY MOUTH EACH DAY WITH SUPPER 90 tablet 2   omeprazole (PRILOSEC) 20 MG capsule Take 20 mg by mouth every other day. In the morning.     No current facility-administered medications for this visit.     Past Medical History:  Diagnosis Date   At risk for sleep apnea    STOP-BANG= 5   SENT TO PCP 05-20-2014   Bilateral carotid artery stenosis    mild bilateral  proximcal ICA  40% per duplex 11/ 2014   Coronary artery disease    a. s/p CABG in 2007 w/ LIMA-LAD, SVG-D1, SVG-1st & 2nd Mrg, SVG-PDA and posterior lateral   ED (erectile dysfunction)    Eye infection 12/27/2014   GERD (gastroesophageal reflux disease)    History of embolic stroke no residual   16/ 2014  --  right PCA and MCA branch infarts (left side vision difficulties)   Hypertension    Other malaise and fatigue    Paroxysmal atrial fibrillation (HCC)    Pneumonia 10/27/2015   led to CHF, in hospital x 6 days   S/P CABG x 6    2007   Ureteral obstruction, right     Past Surgical History:  Procedure  Laterality Date   APPENDECTOMY  1950's   CARDIAC CATHETERIZATION  10-31-2005  dr wall   severe three vessel/  perserved LV   CARDIOVASCULAR STRESS TEST  last one 05-19-2014  dr Jens Som   low risk lexiscan no exercise study/ small inferobasal infarct with no ischemia /  ef 53%   CARDIOVERSION N/A 10/03/2021   Procedure: CARDIOVERSION;  Surgeon: Christell Constant, MD;  Location: [facility] ENDOSCOPY;  Service: Cardiovascular;  Laterality: N/A;   CARDIOVERSION N/A 11/25/2021   Procedure: CARDIOVERSION;  Surgeon: Maisie Fus, MD;  Location: [facility] ENDOSCOPY;  Service: Cardiovascular;  Laterality: N/A;   CARDIOVERSION N/A 12/28/2021   Procedure: CARDIOVERSION;  Surgeon: Thomasene Ripple, DO;  Location: [facility] ENDOSCOPY;  Service: Cardiovascular;  Laterality: N/A;   CARDIOVERSION N/A 08/31/2022   Procedure: CARDIOVERSION;  Surgeon: Quintella Reichert, MD;  Location: [facility] ENDOSCOPY;  Service: Cardiovascular;  Laterality: N/A;   CORONARY ARTERY BYPASS GRAFT  11-01-2005  dr Cornelius Moras   CLOSURE OF PFO/  LIMA to LAD,  SVG to D1,  SVG to 1st & 2nd  MARGINAL branches, SVG  to PDA and posterior lateral   CYSTOSCOPY W/ URETERAL STENT PLACEMENT Right 05/21/2014   Procedure: CYSTOSCOPY WITH RETROGRADE PYELOGRAM/URETERAL STENT PLACEMENT;  Surgeon: Kathi Ludwig, MD;  Location: [facility] [place];  Service: Urology;  Laterality: Right;   CYSTOSCOPY WITH URETEROSCOPY Right 05/21/2014   Procedure: CYSTOSCOPY WITH URETEROSCOPY;  Surgeon: Kathi Ludwig, MD;  Location: [facility];  Service: Urology;  Laterality: Right;   INGUINAL HERNIA REPAIR Bilateral    LAPAROSCOPIC CHOLECYSTECTOMY  10-02-2000   PACEMAKER IMPLANT N/A 10/11/2021   Procedure: PACEMAKER IMPLANT;  Surgeon: Marinus Maw, MD;  Location: [facility] INVASIVE CV LAB;  Service: Cardiovascular;  Laterality: N/A;   RIGHT URETER OBSTRUCTION SURGERY  age 89   TEE WITHOUT CARDIOVERSION N/A 07/17/2013   Procedure: TRANSESOPHAGEAL ECHOCARDIOGRAM (TEE);   Surgeon: Wendall Stade, MD;  Location: [facility] ENDOSCOPY;  Service: Cardiovascular;  Laterality: N/A;  normal LV size, mild prolapse of posterior mitral valve leaflet,  mild MR and TR, normal AV,  no LAA thrombus,  atrial septal aneurysm with no PFO/ ASD and negative bubble study, normal RV, normal aorta with no debris    Social History   Socioeconomic History   Marital status: Married    Spouse name: maxine   Number of children: 4   Years of education: college4   Highest education level: Not on file  Occupational History   Occupation: retired  Tobacco Use   Smoking status: Former    Current packs/day: 0.00    Average packs/day: 1 pack/day for 35.0 years (35.0 ttl pk-yrs)    Types: Cigarettes    Start date: 08/29/1942  Quit date: 08/29/1977    Years since quitting: 45.9   Smokeless tobacco: Never  Substance and Sexual Activity   Alcohol use: No    Alcohol/week: 0.0 standard drinks of alcohol   Drug use: No   Sexual activity: Not on file  Other Topics Concern   Not on file  Social History Narrative   Patient is married with 4 children.   Patient is right handed.   Patient has college education.   Patient drinks decaff coffee.   Social Determinants of Health   Financial Resource Strain: Not on file  Food Insecurity: Not on file  Transportation Needs: Not on file  Physical Activity: Not on file  Stress: Not on file  Social Connections: Not on file  Intimate Partner Violence: Not on file    Family History  Problem Relation Age of Onset   Heart disease Mother        PPM   Hypertension Mother    Lymphoma Father    Prostate cancer Father    Prostate cancer Brother    Aneurysm Brother        REPAIR   CAD Brother        CABG   Other Brother        STENTS IN LEGS   Other Sister        STOMACH ISSUES   Depression Sister    Lung cancer Brother        Asbestosis    ROS: no fevers or chills, productive cough, hemoptysis, dysphasia, odynophagia, melena, hematochezia,  dysuria, hematuria, rash, seizure activity, orthopnea, PND, pedal edema, claudication. Remaining systems are negative.  Physical Exam: Well-developed well-nourished in no acute distress.  Skin is warm and dry.  HEENT is normal.  Neck is supple.  Chest is clear to auscultation with normal expansion.  Cardiovascular exam is regular rate and rhythm.  Abdominal exam nontender or distended. No masses palpated. Extremities show no edema. neuro grossly intact  A/P  1 paroxysmal atrial fibrillation-patient remains in atrial paced rhythm.  Continue amiodarone and Xarelto.  Check hemoglobin and renal function.  Check TSH, liver functions.  Continue carvedilol.  2 chronic combined systolic/diastolic congestive heart failure-patient has had Jardiance and ARB discontinued previously due to dizziness.  However his blood pressure has improved.  Will try Jardiance 10 mg daily to see if he tolerates.  Could consider another attempt at low-dose ARB or hydralazine/nitrates if blood pressure allows.  He is not on spironolactone due to renal insufficiency.  Continue carvedilol.  Continue Demadex.  3 coronary artery disease-patient denies chest pain.  Continue statin.  He is not on aspirin given need for anticoagulation.  4 pacemaker-followed by electrophysiology.  5 hyperlipidemia-continue pravastatin and Zetia.  6 hypertension-patient's blood pressure is elevated.  He is scheduled to see Dr. Kathrene Bongo tomorrow.  Can consider addition of ARB if she feels renal function will tolerate.  Otherwise could consider hydralazine/nitrates.  7 chronic stage IIIb kidney disease-followed by nephrology.  8 lung mass-follow-he is scheduled to see pulmonary.  Olga Millers, MD

## 2023-07-12 ENCOUNTER — Ambulatory Visit (INDEPENDENT_AMBULATORY_CARE_PROVIDER_SITE_OTHER): Payer: Medicare Other

## 2023-07-12 DIAGNOSIS — I255 Ischemic cardiomyopathy: Secondary | ICD-10-CM

## 2023-07-12 LAB — CUP PACEART REMOTE DEVICE CHECK
Battery Remaining Longevity: 132 mo
Battery Voltage: 3.02 V
Brady Statistic AP VP Percent: 85.76 %
Brady Statistic AP VS Percent: 13.9 %
Brady Statistic AS VP Percent: 0.04 %
Brady Statistic AS VS Percent: 0.3 %
Brady Statistic RA Percent Paced: 99.9 %
Brady Statistic RV Percent Paced: 85.79 %
Date Time Interrogation Session: 20241113210201
Implantable Lead Connection Status: 753985
Implantable Lead Connection Status: 753985
Implantable Lead Implant Date: 20230214
Implantable Lead Implant Date: 20230214
Implantable Lead Location: 753859
Implantable Lead Location: 753860
Implantable Lead Model: 3830
Implantable Lead Model: 5076
Implantable Pulse Generator Implant Date: 20230214
Lead Channel Impedance Value: 304 Ohm
Lead Channel Impedance Value: 361 Ohm
Lead Channel Impedance Value: 475 Ohm
Lead Channel Impedance Value: 494 Ohm
Lead Channel Pacing Threshold Amplitude: 0.5 V
Lead Channel Pacing Threshold Amplitude: 0.75 V
Lead Channel Pacing Threshold Pulse Width: 0.4 ms
Lead Channel Pacing Threshold Pulse Width: 0.4 ms
Lead Channel Sensing Intrinsic Amplitude: 13.875 mV
Lead Channel Sensing Intrinsic Amplitude: 13.875 mV
Lead Channel Sensing Intrinsic Amplitude: 4 mV
Lead Channel Sensing Intrinsic Amplitude: 4 mV
Lead Channel Setting Pacing Amplitude: 2 V
Lead Channel Setting Pacing Amplitude: 2 V
Lead Channel Setting Pacing Pulse Width: 0.4 ms
Lead Channel Setting Sensing Sensitivity: 0.9 mV
Zone Setting Status: 755011
Zone Setting Status: 755011

## 2023-07-16 ENCOUNTER — Ambulatory Visit (INDEPENDENT_AMBULATORY_CARE_PROVIDER_SITE_OTHER): Payer: Medicare Other | Admitting: Cardiology

## 2023-07-16 ENCOUNTER — Encounter: Payer: Self-pay | Admitting: Cardiology

## 2023-07-16 VITALS — BP 142/82 | HR 61 | Ht 69.0 in | Wt 174.4 lb

## 2023-07-16 DIAGNOSIS — E7800 Pure hypercholesterolemia, unspecified: Secondary | ICD-10-CM | POA: Diagnosis not present

## 2023-07-16 DIAGNOSIS — I5042 Chronic combined systolic (congestive) and diastolic (congestive) heart failure: Secondary | ICD-10-CM | POA: Diagnosis not present

## 2023-07-16 DIAGNOSIS — I10 Essential (primary) hypertension: Secondary | ICD-10-CM

## 2023-07-16 DIAGNOSIS — I480 Paroxysmal atrial fibrillation: Secondary | ICD-10-CM | POA: Diagnosis not present

## 2023-07-16 MED ORDER — EMPAGLIFLOZIN 10 MG PO TABS: 10.0000 mg | ORAL_TABLET | Freq: Every day | ORAL | Status: DC

## 2023-07-16 NOTE — Patient Instructions (Signed)
Medication Instructions:   START JARDIANCE 10 MG ONCE DAILY  *If you need a refill on your cardiac medications before your next appointment, please call your pharmacy*   Follow-Up: At [facility], you and your health needs are our priority.  As part of our continuing mission to provide you with exceptional heart care, we have created designated Provider Care Teams.  These Care Teams include your primary Cardiologist (physician) and Advanced Practice Providers (APPs -  Physician Assistants and Nurse Practitioners) who all work together to provide you with the care you need, when you need it.  We recommend signing up for the patient portal called "MyChart".  Sign up information is provided on this After Visit Summary.  MyChart is used to connect with patients for Virtual Visits (Telemedicine).  Patients are able to view lab/test results, encounter notes, upcoming appointments, etc.  Non-urgent messages can be sent to your provider as well.   To learn more about what you can do with MyChart, go to [email or web address].    Your next appointment:   6 month(s)  Provider:   Olga Millers, MD

## 2023-07-18 LAB — LAB REPORT - SCANNED
Creatinine, POC: 23 mg/dL
EGFR: 23

## 2023-07-25 NOTE — Progress Notes (Signed)
Remote pacemaker transmission.   

## 2023-08-01 ENCOUNTER — Ambulatory Visit: Payer: Medicare Other | Admitting: Cardiology

## 2023-08-07 ENCOUNTER — Encounter: Payer: Self-pay | Admitting: Emergency Medicine

## 2023-08-07 ENCOUNTER — Ambulatory Visit: Payer: Medicare Other | Admitting: Emergency Medicine

## 2023-08-07 VITALS — BP 140/80 | HR 60 | Temp 97.7°F | Ht 69.5 in | Wt 175.2 lb

## 2023-08-07 DIAGNOSIS — R911 Solitary pulmonary nodule: Secondary | ICD-10-CM | POA: Diagnosis not present

## 2023-08-07 NOTE — Assessment & Plan Note (Signed)
Pulmonary Nodule New 1.4 x 1.1 cm lobulated solid pulmonary nodule in the medial aspect of the apical right upper lobe identified on CT chest. Not present on previous imaging in 2017. Discussed the differential diagnosis, including the possibility of malignancy given the patient's smoking history. Also discussed the potential approaches, including invasive diagnostic procedures, non-invasive monitoring, and PET scan. -Order PET scan to further characterize the nodule and assess for metabolic activity. -Based on PET scan we will decide whether to pursue serial imaging versus referral for consideration of empiric SBRT.  We also discussed the possibility of bronchoscopy but at this point I think the risks of that procedure might preclude.

## 2023-08-07 NOTE — Patient Instructions (Signed)
VISIT SUMMARY:  During today's visit, we reviewed the results of your recent CT scan of the chest, which was performed following a fall in September. The scan revealed a new pulmonary nodule in your right upper lung, which was not present in your previous imaging from 2017. We also discussed your ongoing shoulder discomfort from the fall, your elevated PSA levels, and your history of skin cancer.  YOUR PLAN:  -PULMONARY NODULE: A pulmonary nodule is a small, round growth in the lung. Given your history of smoking, we need to investigate this further to rule out the possibility of cancer. We will order a PET scan to get more information about the nodule and its activity. Once we have the results, we will discuss the next steps.   INSTRUCTIONS:  We will schedule a PET scan as soon as possible to further evaluate the pulmonary nodule. We will review the results together and decide on the next steps. If your shoulder pain continues or worsens, or if you have any concerns about your PSA levels or skin cancer, please contact your primary care doctor or specialist.

## 2023-08-07 NOTE — Progress Notes (Signed)
Subjective:    Patient ID: Jose [name], male    DOB: [birth year], 89 y.o.   MRN: [id number]  HPI 89 year old former smoker (35 pack years) with history of coronary artery disease/CABG, embolic CVA, hypertension, paroxysmal atrial fibrillation, GERD.  Has been seen in our office by Dr. Sherene Sires in the past for hypoxemic respiratory failure and cryptogenic organizing pneumonia, last in 2017. The patient is currently presenting for review of an abnormal CT scan of the chest.  The patient experienced a mechanical fall in September while working in the yard, resulting in a bruised rotator cuff. The fall prompted a CT scan of the head and cervical spine, which did not show any evidence of trauma but identified a 1.3 cm right upper lobe pulmonary nodule. A dedicated CT scan of the chest was performed in October, revealing biapical pleuroparenchymal scar, mild central lobular paraceptal emphysema, mild bilateral subpleural reticulation, and a 1.4 by 1.1 cm lobulated solid pulmonary nodule in the medial aspect of the apical right upper lobe. This nodule was not present in a CT chest done in 2017.  The patient reports ongoing shoulder discomfort from the fall, particularly when lifting objects in certain directions. He also mentions occasional coughing but denies any shortness of breath. The patient has a history of skin cancer with multiple surgeries and a slightly elevated PSA, raising concerns about possible prostate issues. The patient has not been exposed to any occupational lung hazards and has been a non-smoker since 1979.     RADIOLOGY Head CT: No evidence of trauma (05/12/2023) Cervical spine CT: No evidence of trauma (05/12/2023) Chest CT:  CT chest 06/08/2023 reviewed by me shows no evidence any mediastinal or hilar adenopathy, biapical pleural-parenchymal scarring and some mild centrilobular emphysematous change with some paraseptal emphysema and bilateral reticulation.  There is a 1.4 x 1.1  lobulated solid nodule in the medial aspect of the apical right upper lobe that is new from 2017  Review of Systems As per HPI  Past Medical History:  Diagnosis Date   At risk for sleep apnea    STOP-BANG= 5   SENT TO PCP 05-20-2014   Bilateral carotid artery stenosis    mild bilateral proximcal ICA  40% per duplex 11/ 2014   Coronary artery disease    a. s/p CABG in 2007 w/ LIMA-LAD, SVG-D1, SVG-1st & 2nd Mrg, SVG-PDA and posterior lateral   ED (erectile dysfunction)    Eye infection 12/27/2014   GERD (gastroesophageal reflux disease)    History of embolic stroke no residual   40/ 2014  --  right PCA and MCA branch infarts (left side vision difficulties)   Hypertension    Other malaise and fatigue    Paroxysmal atrial fibrillation (HCC)    Pneumonia 10/27/2015   led to CHF, in hospital x 6 days   S/P CABG x 6    2007   Ureteral obstruction, right      Family History  Problem Relation Age of Onset   Heart disease Mother        PPM   Hypertension Mother    Lymphoma Father    Prostate cancer Father    Prostate cancer Brother    Aneurysm Brother        REPAIR   CAD Brother        CABG   Other Brother        STENTS IN LEGS   Other Sister        STOMACH ISSUES  Depression Sister    Lung cancer Brother        Asbestosis     Social History   Socioeconomic History   Marital status: Married    Spouse name: maxine   Number of children: 4   Years of education: college4   Highest education level: Not on file  Occupational History   Occupation: retired  Tobacco Use   Smoking status: Former    Current packs/day: 0.00    Average packs/day: 1 pack/day for 35.0 years (35.0 ttl pk-yrs)    Types: Cigarettes    Start date: 08/29/1942    Quit date: 08/29/1977    Years since quitting: 45.9   Smokeless tobacco: Never  Substance and Sexual Activity   Alcohol use: No    Alcohol/week: 0.0 standard drinks of alcohol   Drug use: No   Sexual activity: Not on file  Other  Topics Concern   Not on file  Social History Narrative   Patient is married with 4 children.   Patient is right handed.   Patient has college education.   Patient drinks decaff coffee.   Social Determinants of Health   Financial Resource Strain: Not on file  Food Insecurity: Not on file  Transportation Needs: Not on file  Physical Activity: Not on file  Stress: Not on file  Social Connections: Not on file  Intimate Partner Violence: Not on file     Allergies  Allergen Reactions   Cephalexin Anaphylaxis    Pt says he has tolerated PCN in the past   Iodinated Contrast Media Anaphylaxis   Iohexol Anaphylaxis     THROAT SWELLS AND STOPS BREATHING    Ace Inhibitors Cough   Metoprolol Other (See Comments)    Erectile dysfunction   Viagra [Sildenafil Citrate] Other (See Comments)    Headache and vision changes   Ciprofloxacin Hives, Itching and Rash   Codeine Nausea And Vomiting and Other (See Comments)    headaches   Sulfa Antibiotics Rash, Hives and Itching     Outpatient Medications Prior to Visit  Medication Sig Dispense Refill   amiodarone (PACERONE) 200 MG tablet Take 1 tablet (200 mg total) by mouth daily. 180 tablet 3   Bacitracin (ANTIBIOTIC EX) Apply 1 Application topically daily as needed (wound care).     carvedilol (COREG) 3.125 MG tablet TAKE 1 TABLET BY MOUTH TWICE A DAY WITH MEALS 60 tablet 10   cholecalciferol (VITAMIN D) 25 MCG (1000 UNIT) tablet Take 1,000 Units by mouth in the morning.     COD LIVER OIL PO Take 1 capsule by mouth at bedtime.      empagliflozin (JARDIANCE) 10 MG TABS tablet Take 1 tablet (10 mg total) by mouth daily.     ezetimibe (ZETIA) 10 MG tablet Take 10 mg by mouth every morning.     famotidine (PEPCID) 40 MG tablet Take 40 mg by mouth daily.     loratadine (CLARITIN) 10 MG tablet Take 10 mg by mouth daily as needed for allergies.     Multiple Vitamin (MULTIVITAMIN WITH MINERALS) TABS tablet Take 1 tablet by mouth in the morning.      Polyethyl Glycol-Propyl Glycol (SYSTANE ULTRA OP) Place 1-2 drops into both eyes in the morning and at bedtime.     pravastatin (PRAVACHOL) 80 MG tablet Take 80 mg by mouth at bedtime.     saw palmetto 160 MG capsule Take 160 mg by mouth 2 (two) times daily.     TESTOSTERONE TD Place  1 mL onto the skin See admin instructions. 10% testosterone cream compounded at [facility]  - apply 1 ml topically to shoulders once every morning     torsemide (DEMADEX) 20 MG tablet Take 2 tablets (40 mg total) by mouth daily. 270 tablet 3   White Petrolatum (VASELINE EX) Apply 1 Application topically daily.     XARELTO 15 MG TABS tablet TAKE ONE (1) TABLET BY MOUTH EACH DAY WITH SUPPER 90 tablet 2   omeprazole (PRILOSEC) 20 MG capsule Take 20 mg by mouth every other day. In the morning.     No facility-administered medications prior to visit.        Objective:   Physical Exam Vitals:   08/07/23 1106  BP: (!) 140/80  Pulse: 60  Temp: 97.7 F (36.5 C)  TempSrc: Oral  SpO2: 95%  Weight: 175 lb 3.2 oz (79.5 kg)  Height: 5' 9.5" (1.765 m)   Gen: Pleasant, well-nourished, in no distress,  normal affect  ENT: No lesions,  mouth clear,  oropharynx clear, no postnasal drip  Neck: No JVD, no stridor  Lungs: No use of accessory muscles, no crackles or wheezing on normal respiration, no wheeze on forced expiration  Cardiovascular: RRR, heart sounds normal, no murmur or gallops, no peripheral edema  Musculoskeletal: No deformities, no cyanosis or clubbing  Neuro: alert, awake, non focal  Skin: Warm, no lesions or rash      Assessment & Plan:  Pulmonary nodule 1 cm or greater in diameter Pulmonary Nodule New 1.4 x 1.1 cm lobulated solid pulmonary nodule in the medial aspect of the apical right upper lobe identified on CT chest. Not present on previous imaging in 2017. Discussed the differential diagnosis, including the possibility of malignancy given the patient's smoking history. Also  discussed the potential approaches, including invasive diagnostic procedures, non-invasive monitoring, and PET scan. -Order PET scan to further characterize the nodule and assess for metabolic activity. -Based on PET scan we will decide whether to pursue serial imaging versus referral for consideration of empiric SBRT.  We also discussed the possibility of bronchoscopy but at this point I think the risks of that procedure might preclude.   Levy Pupa, MD, PhD 08/07/2023, 12:56 PM [facility] [phone] or if no answer before 7:00PM call [phone] For any issues after 7:00PM please call eLink [phone]

## 2023-08-16 ENCOUNTER — Encounter (HOSPITAL_COMMUNITY)
Admission: RE | Admit: 2023-08-16 | Discharge: 2023-08-16 | Disposition: A | Discharge status: Home / Self Care | Payer: Medicare Other | Source: Ambulatory Visit | Attending: Emergency Medicine | Admitting: Emergency Medicine

## 2023-08-16 DIAGNOSIS — R911 Solitary pulmonary nodule: Secondary | ICD-10-CM | POA: Insufficient documentation

## 2023-08-16 LAB — GLUCOSE, CAPILLARY: Glucose-Capillary: 79 mg/dL (ref 70–99)

## 2023-08-16 MED ORDER — FLUDEOXYGLUCOSE F - 18 (FDG) INJECTION
8.7000 | Freq: Once | INTRAVENOUS | Status: AC
  Administered 2023-08-16: 8.67 via INTRAVENOUS

## 2023-09-19 ENCOUNTER — Encounter: Payer: Self-pay | Admitting: Emergency Medicine

## 2023-09-19 ENCOUNTER — Ambulatory Visit (INDEPENDENT_AMBULATORY_CARE_PROVIDER_SITE_OTHER): Payer: Medicare Other | Admitting: Emergency Medicine

## 2023-09-19 VITALS — BP 140/56 | HR 60 | Ht 69.5 in | Wt 171.8 lb

## 2023-09-19 DIAGNOSIS — R911 Solitary pulmonary nodule: Secondary | ICD-10-CM

## 2023-09-19 NOTE — Patient Instructions (Signed)
We reviewed your PET scan today. There is a right upper lobe nodule that is suspicious for an isolated lung cancer. We will refer you to see radiation oncology to discuss the pros and cons of possible stereotactic radiation treatment to this lung nodule.   Alternatively we could consider repeating the CT scan of the chest in 3 months (March 2025) Follow Dr. Delton Coombes in March 2025.  Call sooner if you have any problems.

## 2023-09-19 NOTE — Progress Notes (Signed)
Subjective:    Patient ID: Jose [name], male    DOB: October 31, 1930, 89 y.o.   MRN: [id number]  HPI 89 year old former smoker (35 pack years) with history of coronary artery disease/CABG, embolic CVA, hypertension, paroxysmal atrial fibrillation, GERD.  Has been seen in our office by Dr. Sherene Sires in the past for hypoxemic respiratory failure and cryptogenic organizing pneumonia, last in 2017. The patient is currently presenting for review of an abnormal CT scan of the chest.  The patient experienced a mechanical fall in September while working in the yard, resulting in a bruised rotator cuff. The fall prompted a CT scan of the head and cervical spine, which did not show any evidence of trauma but identified a 1.3 cm right upper lobe pulmonary nodule. A dedicated CT scan of the chest was performed in October, revealing biapical pleuroparenchymal scar, mild central lobular paraceptal emphysema, mild bilateral subpleural reticulation, and a 1.4 by 1.1 cm lobulated solid pulmonary nodule in the medial aspect of the apical right upper lobe. This nodule was not present in a CT chest done in 2017.  The patient reports ongoing shoulder discomfort from the fall, particularly when lifting objects in certain directions. He also mentions occasional coughing but denies any shortness of breath. The patient has a history of skin cancer with multiple surgeries and a slightly elevated PSA, raising concerns about possible prostate issues. The patient has not been exposed to any occupational lung hazards and has been a non-smoker since 1979.   ROV 09/19/23 --Jose [name] is a 89 year old gentleman who returns today to discuss an abnormal CT scan of the chest.  He has a history of CAD/CABG, CVA, hypertension, A-fib, GERD, former tobacco use. CT chest from 06/08/2023 showed a 1.4 x 1.1 lobulated apical right upper lobe nodule.  We performed a PET scan which was done 08/16/2023 as below. The nodule 1.5 x 1.7 cm with an SUV max of  3.9.  There is no evidence of distant disease or other hypermetabolism present. He continues to feel well. He has a URI since I saw him w a cough - now better. No hemoptysis.    PET scan 08/16/2023 reviewed by me shows 1.5 x 1.7 apical right upper lobe pulmonary nodule with an SUV max of 3.9.  No mediastinal or hilar adenopathy.  No evidence of distant disease.  There is hypermetabolism associated with the left posterior parotid without a definite CT correlate, mildly prominent prostate  Review of Systems As per HPI  Past Medical History:  Diagnosis Date   At risk for sleep apnea    STOP-BANG= 5   SENT TO PCP 05-20-2014   Bilateral carotid artery stenosis    mild bilateral proximcal ICA  40% per duplex 11/ 2014   Coronary artery disease    a. s/p CABG in 2007 w/ LIMA-LAD, SVG-D1, SVG-1st & 2nd Mrg, SVG-PDA and posterior lateral   ED (erectile dysfunction)    Eye infection 12/27/2014   GERD (gastroesophageal reflux disease)    History of embolic stroke no residual   18/ 2014  --  right PCA and MCA branch infarts (left side vision difficulties)   Hypertension    Other malaise and fatigue    Paroxysmal atrial fibrillation (HCC)    Pneumonia 10/27/2015   led to CHF, in hospital x 6 days   S/P CABG x 6    2007   Ureteral obstruction, right      Family History  Problem Relation Age of Onset   Heart  disease Mother        PPM   Hypertension Mother    Lymphoma Father    Prostate cancer Father    Prostate cancer Brother    Aneurysm Brother        REPAIR   CAD Brother        CABG   Other Brother        STENTS IN LEGS   Other Sister        STOMACH ISSUES   Depression Sister    Lung cancer Brother        Asbestosis     Social History   Socioeconomic History   Marital status: Married    Spouse name: maxine   Number of children: 4   Years of education: college4   Highest education level: Not on file  Occupational History   Occupation: retired  Tobacco Use   Smoking  status: Former    Current packs/day: 0.00    Average packs/day: 1 pack/day for 35.0 years (35.0 ttl pk-yrs)    Types: Cigarettes    Start date: 08/29/1942    Quit date: 08/29/1977    Years since quitting: 46.0   Smokeless tobacco: Never  Substance and Sexual Activity   Alcohol use: No    Alcohol/week: 0.0 standard drinks of alcohol   Drug use: No   Sexual activity: Not on file  Other Topics Concern   Not on file  Social History Narrative   Patient is married with 4 children.   Patient is right handed.   Patient has college education.   Patient drinks decaff coffee.   Social Drivers of Corporate investment banker Strain: Not on file  Food Insecurity: Not on file  Transportation Needs: Not on file  Physical Activity: Not on file  Stress: Not on file  Social Connections: Not on file  Intimate Partner Violence: Not on file     Allergies  Allergen Reactions   Cephalexin Anaphylaxis    Pt says he has tolerated PCN in the past   Iodinated Contrast Media Anaphylaxis   Iohexol Anaphylaxis     THROAT SWELLS AND STOPS BREATHING    Ace Inhibitors Cough   Metoprolol Other (See Comments)    Erectile dysfunction   Viagra [Sildenafil Citrate] Other (See Comments)    Headache and vision changes   Ciprofloxacin Hives, Itching and Rash   Codeine Nausea And Vomiting and Other (See Comments)    headaches   Sulfa Antibiotics Rash, Hives and Itching     Outpatient Medications Prior to Visit  Medication Sig Dispense Refill   amiodarone (PACERONE) 200 MG tablet Take 1 tablet (200 mg total) by mouth daily. 180 tablet 3   Bacitracin (ANTIBIOTIC EX) Apply 1 Application topically daily as needed (wound care).     carvedilol (COREG) 3.125 MG tablet TAKE 1 TABLET BY MOUTH TWICE A DAY WITH MEALS 60 tablet 10   cholecalciferol (VITAMIN D) 25 MCG (1000 UNIT) tablet Take 1,000 Units by mouth in the morning.     COD LIVER OIL PO Take 1 capsule by mouth at bedtime.      empagliflozin (JARDIANCE) 10  MG TABS tablet Take 1 tablet (10 mg total) by mouth daily.     ezetimibe (ZETIA) 10 MG tablet Take 10 mg by mouth every morning.     famotidine (PEPCID) 40 MG tablet Take 40 mg by mouth daily.     loratadine (CLARITIN) 10 MG tablet Take 10 mg by mouth daily as  needed for allergies.     Multiple Vitamin (MULTIVITAMIN WITH MINERALS) TABS tablet Take 1 tablet by mouth in the morning.     Polyethyl Glycol-Propyl Glycol (SYSTANE ULTRA OP) Place 1-2 drops into both eyes in the morning and at bedtime.     pravastatin (PRAVACHOL) 80 MG tablet Take 80 mg by mouth at bedtime.     saw palmetto 160 MG capsule Take 160 mg by mouth 2 (two) times daily.     TESTOSTERONE TD Place 1 mL onto the skin See admin instructions. 10% testosterone cream compounded at [facility]  - apply 1 ml topically to shoulders once every morning     torsemide (DEMADEX) 20 MG tablet Take 2 tablets (40 mg total) by mouth daily. 270 tablet 3   White Petrolatum (VASELINE EX) Apply 1 Application topically daily.     XARELTO 15 MG TABS tablet TAKE ONE (1) TABLET BY MOUTH EACH DAY WITH SUPPER 90 tablet 2   No facility-administered medications prior to visit.        Objective:   Physical Exam Vitals:   09/19/23 1305  BP: (!) 140/56  Pulse: 60  SpO2: 98%  Weight: 171 lb 12.8 oz (77.9 kg)  Height: 5' 9.5" (1.765 m)   Gen: Pleasant, well-nourished, in no distress,  normal affect  ENT: No lesions,  mouth clear,  oropharynx clear, no postnasal drip  Neck: No JVD, no stridor  Lungs: No use of accessory muscles, no crackles or wheezing on normal respiration, no wheeze on forced expiration  Cardiovascular: RRR, heart sounds normal, no murmur or gallops, no peripheral edema  Musculoskeletal: No deformities, no cyanosis or clubbing  Neuro: alert, awake, non focal  Skin: Warm, no lesions or rash      Assessment & Plan:  Pulmonary nodule 1 cm or greater in diameter Isolated right upper lobe pulmonary nodule, probably  slightly larger and with an SUV of 3.9.  Suspicious for an isolated non-small cell lung cancer.  I talked to him today about the pros, cons of bronchoscopy versus watchful waiting.  Also mention to the possibility of empiric SBRT given his age and its risks for bronchoscopy.  He would like to hear from radiation oncology, determine whether this is a viable option for him.  I will make that referral.  I will follow-up with him in March.  If we defer SBRT then I will do serial follow-up CT in March and we will decide whether biopsy, watchful waiting, discussion with radiation oncology would be best.    Levy Pupa, MD, PhD 09/19/2023, 1:34 PM [facility] [phone] or if no answer before 7:00PM call [phone] For any issues after 7:00PM please call eLink [phone]

## 2023-09-19 NOTE — Assessment & Plan Note (Signed)
Isolated right upper lobe pulmonary nodule, probably slightly larger and with an SUV of 3.9.  Suspicious for an isolated non-small cell lung cancer.  I talked to him today about the pros, cons of bronchoscopy versus watchful waiting.  Also mention to the possibility of empiric SBRT given his age and its risks for bronchoscopy.  He would like to hear from radiation oncology, determine whether this is a viable option for him.  I will make that referral.  I will follow-up with him in March.  If we defer SBRT then I will do serial follow-up CT in March and we will decide whether biopsy, watchful waiting, discussion with radiation oncology would be best.

## 2023-09-24 ENCOUNTER — Ambulatory Visit (INDEPENDENT_AMBULATORY_CARE_PROVIDER_SITE_OTHER): Payer: Medicare Other | Admitting: Orthopedic Surgery

## 2023-09-24 ENCOUNTER — Encounter: Payer: Self-pay | Admitting: Orthopedic Surgery

## 2023-09-24 DIAGNOSIS — B351 Tinea unguium: Secondary | ICD-10-CM

## 2023-09-24 NOTE — Progress Notes (Signed)
 Thoracic Location of Tumor / Histology: Right Lung (upper lobe nodule)  Patient presented as referral from Dr. Lamar Sabal office [facility].  08/16/2023 Dr. Lamar Chris NM PET Image Initial (PI) Skull Base to Thigh CLINICAL DATA: Initial treatment strategy for lung nodule.   IMPRESSION: 1. Hypermetabolic right upper lobe nodule, indicative of stage IA primary bronchogenic carcinoma. 2. Hypermetabolism associated with the posterior left parotid without a definite CT correlate. 3. Chronic right UPJ obstruction. 4. Moderate hiatal hernia. 5. Chronic calcific pancreatitis. 6. Mildly prominent prostate. 7. Enlarged pulmonic trunk, indicative of pulmonary arterial hypertension. 8.  Emphysema (ICD10-J43.9). 9.  Aortic atherosclerosis (ICD10-I70.0).  06/08/2023 Dr. Maude Sprague CT Chest without Contrast CLINICAL DATA: Pulmonary nodule.   IMPRESSION: 1. 1.3 cm right upper lobe pulmonary nodule, new from 2017 and suspicious for pulmonary neoplasm. Per Fleischner Society Guidelines, consider a non-contrast Chest CT at 3 months, a PET/CT, or tissue sampling. These guidelines do not apply to immunocompromised patients and patients with cancer. Follow up in patients with significant comorbidities as clinically warranted. For lung cancer screening, adhere to Lung-RADS guidelines. Reference: Radiology. 2017; 284(1):228-43. 2. Aortic Atherosclerosis (ICD10-I70.0) and Emphysema (ICD10-J43.9).  Past/Anticipated interventions by cardiothoracic surgery, if any:   Dr. Lamar Chris Assessment & Plan: Pulmonary nodule 1 cm or greater in diameter Isolated right upper lobe pulmonary nodule, probably slightly larger and with an SUV of 3.9.  Suspicious for an isolated non-small cell lung cancer.  I talked to him today about the pros, cons of bronchoscopy versus watchful waiting.  Also mention to the possibility of empiric SBRT given his age and its risks for bronchoscopy.  He would like  to hear from radiation oncology, determine whether this is a viable option for him.  I will make that referral.  I will follow-up with him in March.  If we defer SBRT then I will do serial follow-up CT in March and we will decide whether biopsy, watchful waiting, discussion with radiation oncology would be best.  Past/Anticipated interventions by medical oncology, if any: NA  Tobacco/Marijuana/Snuff/ETOH use: Former smoker, no alcohol , drug or snuff use.  Signs/Symptoms Weight changes, if any:  No Respiratory complaints, if any:  No Hemoptysis, if any: No Pain issues, if any:  0/10  SAFETY ISSUES: Prior radiation?  No Pacemaker/ICD?  Yes, pacemaker Possible current pregnancy? Male Is the patient on methotrexate?  No  Current Complaints / other details:

## 2023-09-24 NOTE — Progress Notes (Signed)
Office Visit Note   Patient: Jose [name]           Date of Birth: [birth year]           MRN: [id number] Visit Date: 09/24/2023              Requested by: Mosetta Putt, MD [address] [place],  [place] [address] PCP: Mosetta Putt, MD  No chief complaint on file.     HPI: Patient is a 89 year old gentleman who is seen for evaluation of both feet.  Patient has persistent onychomycotic nails that he is unable to safely care for his own.  Assessment & Plan: Visit Diagnoses:  1. Onychomycosis     Plan: Nails were trimmed x 10 without complication.  Follow-Up Instructions: Return in about 3 months (around 12/23/2023).   Ortho Exam  Patient is alert, oriented, no adenopathy, well-dressed, normal affect, normal respiratory effort. Examination of both feet there are no plantar ulcers no hypertrophic callus no signs of infection.  Patient does have thickened discolored onychomycotic nails x 10.  He is unable to safely trim the nails on his own and the nails were trimmed x 10 without complication.  Imaging: No results found. No images are attached to the encounter.  Labs: Lab Results  Component Value Date   HGBA1C 6.2 (H) 07/10/2013   ESRSEDRATE 12 01/13/2016   ESRSEDRATE 20 12/21/2015   ESRSEDRATE 94 (H) 11/26/2015   REPTSTATUS 11/24/2015 FINAL 11/24/2015   REPTSTATUS 11/28/2015 FINAL 11/24/2015   GRAMSTAIN  11/24/2015    ABUNDANT WBC PRESENT,BOTH PMN AND MONONUCLEAR FEW SQUAMOUS EPITHELIAL CELLS PRESENT MODERATE GRAM POSITIVE COCCI IN PAIRS MODERATE GRAM NEGATIVE RODS Performed at [facility]    CULT  11/24/2015    NORMAL OROPHARYNGEAL FLORA Performed at [facility]      Lab Results  Component Value Date   ALBUMIN 4.1 11/09/2022   ALBUMIN 4.0 11/08/2021   ALBUMIN 3.8 10/10/2021    Lab Results  Component Value Date   MG 2.0 10/10/2021   MG 1.6 (L) 11/22/2015   No results found for: "VD25OH"  No results found for:  "PREALBUMIN"    Latest Ref Rng & Units 11/09/2022   10:16 AM 11/25/2021   10:08 AM 10/10/2021   10:20 AM  CBC EXTENDED  WBC 3.4 - 10.8 x10E3/uL 5.7   5.6   RBC 4.14 - 5.80 x10E6/uL 5.34   5.50   Hemoglobin 13.0 - 17.7 g/dL 62.1  30.8  65.7   HCT 37.5 - 51.0 % 47.0  49.0  50.3   Platelets 150 - 450 x10E3/uL 138   139   NEUT# 1.7 - 7.7 K/uL   3.7   Lymph# 0.7 - 4.0 K/uL   1.2      There is no height or weight on file to calculate BMI.  Orders:  No orders of the defined types were placed in this encounter.  No orders of the defined types were placed in this encounter.    Procedures: No procedures performed  Clinical Data: No additional findings.  ROS:  All other systems negative, except as noted in the HPI. Review of Systems  Objective: Vital Signs: There were no vitals taken for this visit.  Specialty Comments:  No specialty comments available.  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   Interstitial lung disease (HCC) 11/27/2015   BOOP (bronchiolitis obliterans with organizing pneumonia) (HCC) 11/27/2015   Elevated troponin 11/27/2015   Hydronephrosis, right 11/27/2015   SOB (shortness of breath)    Acute respiratory failure with hypoxia (HCC) 11/26/2015   Chronic kidney disease (CKD), stage III (moderate) (HCC) 11/26/2015   Acute on chronic diastolic (congestive) heart failure (HCC)    Lobar pneumonia (HCC)    Acute on chronic combined systolic (congestive) and diastolic (congestive) heart failure (HCC) 11/22/2015   Atrial fibrillation (HCC) 09/10/2013   Occlusion and stenosis of vertebral artery with cerebral infarction (HCC) 07/09/2013   Visual field loss 07/09/2013    HYPERLIPIDEMIA-MIXED 12/23/2009   CAD, NATIVE VESSEL 12/15/2009   ERECTILE DYSFUNCTION 12/10/2008   Essential hypertension 12/10/2008   FATIGUE 12/10/2008   Past Medical History:  Diagnosis Date   At risk for sleep apnea    STOP-BANG= 5   SENT TO PCP 05-20-2014   Bilateral carotid artery stenosis    mild bilateral proximcal ICA  40% per duplex 11/ 2014   Coronary artery disease    a. s/p CABG in 2007 w/ LIMA-LAD, SVG-D1, SVG-1st & 2nd Mrg, SVG-PDA and posterior lateral   ED (erectile dysfunction)    Eye infection 12/27/2014   GERD (gastroesophageal reflux disease)    History of embolic stroke no residual   16/ 2014  --  right PCA and MCA branch infarts (left side vision difficulties)   Hypertension    Other malaise and fatigue    Paroxysmal atrial fibrillation (HCC)    Pneumonia 10/27/2015   led to CHF, in hospital x 6 days   S/P CABG x 6    2007   Ureteral obstruction, right     Family History  Problem Relation Age of Onset   Heart disease Mother        PPM   Hypertension Mother    Lymphoma Father    Prostate cancer Father    Prostate cancer Brother    Aneurysm Brother        REPAIR   CAD Brother        CABG   Other Brother        STENTS IN LEGS   Other Sister        STOMACH ISSUES   Depression Sister    Lung cancer Brother        Asbestosis    Past Surgical History:  Procedure Laterality Date   APPENDECTOMY  1950's   CARDIAC CATHETERIZATION  10-31-2005  dr wall   severe three vessel/  perserved LV   CARDIOVASCULAR STRESS TEST  last one 05-19-2014  dr Jens Som   low risk lexiscan no exercise study/ small inferobasal infarct with no ischemia /  ef 53%   CARDIOVERSION N/A 10/03/2021   Procedure: CARDIOVERSION;  Surgeon: Christell Constant, MD;  Location: [facility] ENDOSCOPY;  Service: Cardiovascular;  Laterality: N/A;   CARDIOVERSION N/A 11/25/2021   Procedure: CARDIOVERSION;  Surgeon: Maisie Fus, MD;  Location: [facility] ENDOSCOPY;  Service: Cardiovascular;   Laterality: N/A;   CARDIOVERSION N/A 12/28/2021   Procedure: CARDIOVERSION;  Surgeon: Thomasene Ripple, DO;  Location: [facility] ENDOSCOPY;  Service: Cardiovascular;  Laterality: N/A;   CARDIOVERSION N/A 08/31/2022   Procedure: CARDIOVERSION;  Surgeon: Quintella Reichert, MD;  Location: [facility] ENDOSCOPY;  Service: Cardiovascular;  Laterality: N/A;   CORONARY ARTERY BYPASS GRAFT  11-01-2005  dr Cornelius Moras  CLOSURE OF PFO/  LIMA to LAD,  SVG to D1,  SVG to 1st & 2nd  MARGINAL branches, SVG  to PDA and posterior lateral   CYSTOSCOPY W/ URETERAL STENT PLACEMENT Right 05/21/2014   Procedure: CYSTOSCOPY WITH RETROGRADE PYELOGRAM/URETERAL STENT PLACEMENT;  Surgeon: Kathi Ludwig, MD;  Location: [facility];  Service: Urology;  Laterality: Right;   CYSTOSCOPY WITH URETEROSCOPY Right 05/21/2014   Procedure: CYSTOSCOPY WITH URETEROSCOPY;  Surgeon: Kathi Ludwig, MD;  Location: [facility];  Service: Urology;  Laterality: Right;   INGUINAL HERNIA REPAIR Bilateral    LAPAROSCOPIC CHOLECYSTECTOMY  10-02-2000   PACEMAKER IMPLANT N/A 10/11/2021   Procedure: PACEMAKER IMPLANT;  Surgeon: Marinus Maw, MD;  Location: [facility]VASIVE CV LAB;  Service: Cardiovascular;  Laterality: N/A;   RIGHT URETER OBSTRUCTION SURGERY  age 87   TEE WITHOUT CARDIOVERSION N/A 07/17/2013   Procedure: TRANSESOPHAGEAL ECHOCARDIOGRAM (TEE);  Surgeon: Wendall Stade, MD;  Location: [facility] ENDOSCOPY;  Service: Cardiovascular;  Laterality: N/A;  normal LV size, mild prolapse of posterior mitral valve leaflet,  mild MR and TR, normal AV,  no LAA thrombus,  atrial septal aneurysm with no PFO/ ASD and negative bubble study, normal RV, normal aorta with no debris   Social History   Occupational History   Occupation: retired  Tobacco Use   Smoking status: Former    Current packs/day: 0.00    Average packs/day: 1 pack/day for 35.0 years (35.0 ttl pk-yrs)    Types: Cigarettes    Start date: 08/29/1942    Quit date: 08/29/1977     Years since quitting: 46.1   Smokeless tobacco: Never  Substance and Sexual Activity   Alcohol use: No    Alcohol/week: 0.0 standard drinks of alcohol   Drug use: No   Sexual activity: Not on file

## 2023-10-01 NOTE — Progress Notes (Signed)
 Radiation Oncology         [phone] ________________________________  Initial outpatient Consultation  Name: Jose [name] MRN: [id number]  Date of Service: 10/02/2023 DOB: 1930-11-07  RR:Aonfhmzw, Maude, MD  Shelah Lamar RAMAN, MD   REFERRING PHYSICIAN: Shelah Lamar RAMAN, MD  DIAGNOSIS: 89 yo man with putative Stage IA NSCLC in the RUL lung    ICD-10-CM   1. Pulmonary nodule 1 cm or greater in diameter  R91.1       HISTORY OF PRESENT ILLNESS: Jose [name] is a 89 y.o. male seen at the request of Dr. Shelah.  He is a former smoker with a 35-pack-year history, quit smoking in 1979.  He was noted to have a 1.3 cm right upper lobe pulmonary nodule on CT chest ordered by his PCP in October 2024.  This is new as compared to a prior CT chest from March 2017.  He met with Dr. Shelah on 08/07/2023 and a PET scan was ordered.  The PET scan was performed on 08/16/2023 and confirmed a hypermetabolic right upper lobe nodule indicative of stage Ia primary bronchogenic carcinoma.  Given his advanced age and medical comorbidities, he is felt to be high risk for biopsy and therefore, kindly referred to us  to discuss the potential role for empiric stereotactic body radiotherapy (SBRT).  PREVIOUS RADIATION THERAPY: No  PAST MEDICAL HISTORY:  Past Medical History:  Diagnosis Date   At risk for sleep apnea    STOP-BANG= 5   SENT TO PCP 05-20-2014   Bilateral carotid artery stenosis    mild bilateral proximcal ICA  40% per duplex 11/ 2014   Coronary artery disease    a. s/p CABG in 2007 w/ LIMA-LAD, SVG-D1, SVG-1st & 2nd Mrg, SVG-PDA and posterior lateral   ED (erectile dysfunction)    Eye infection 12/27/2014   GERD (gastroesophageal reflux disease)    History of embolic stroke no residual   88/ 2014  --  right PCA and MCA branch infarts (left side vision difficulties)   Hypertension    Other malaise and fatigue    Paroxysmal atrial fibrillation (HCC)    Pneumonia 10/27/2015   led to CHF, in  hospital x 6 days   S/P CABG x 6    2007   Ureteral obstruction, right       PAST SURGICAL HISTORY: Past Surgical History:  Procedure Laterality Date   APPENDECTOMY  1950's   CARDIAC CATHETERIZATION  10-31-2005  dr wall   severe three vessel/  perserved LV   CARDIOVASCULAR STRESS TEST  last one 05-19-2014  dr pietro   low risk lexiscan  no exercise study/ small inferobasal infarct with no ischemia /  ef 53%   CARDIOVERSION N/A 10/03/2021   Procedure: CARDIOVERSION;  Surgeon: Santo Stanly LABOR, MD;  Location: [facility] ENDOSCOPY;  Service: Cardiovascular;  Laterality: N/A;   CARDIOVERSION N/A 11/25/2021   Procedure: CARDIOVERSION;  Surgeon: Alvan Ronal BRAVO, MD;  Location: [facility] ENDOSCOPY;  Service: Cardiovascular;  Laterality: N/A;   CARDIOVERSION N/A 12/28/2021   Procedure: CARDIOVERSION;  Surgeon: Sheena Pugh, DO;  Location: [facility] ENDOSCOPY;  Service: Cardiovascular;  Laterality: N/A;   CARDIOVERSION N/A 08/31/2022   Procedure: CARDIOVERSION;  Surgeon: Shlomo Wilbert SAUNDERS, MD;  Location: [facility] ENDOSCOPY;  Service: Cardiovascular;  Laterality: N/A;   CORONARY ARTERY BYPASS GRAFT  11-01-2005  dr dusty   CLOSURE OF PFO/  LIMA to LAD,  SVG to D1,  SVG to 1st & 2nd  MARGINAL branches, SVG  to PDA and  posterior lateral   CYSTOSCOPY W/ URETERAL STENT PLACEMENT Right 05/21/2014   Procedure: CYSTOSCOPY WITH RETROGRADE PYELOGRAM/URETERAL STENT PLACEMENT;  Surgeon: Arlena LILLETTE Gal, MD;  Location: [facility];  Service: Urology;  Laterality: Right;   CYSTOSCOPY WITH URETEROSCOPY Right 05/21/2014   Procedure: CYSTOSCOPY WITH URETEROSCOPY;  Surgeon: Arlena LILLETTE Gal, MD;  Location: [facility];  Service: Urology;  Laterality: Right;   INGUINAL HERNIA REPAIR Bilateral    LAPAROSCOPIC CHOLECYSTECTOMY  10-02-2000   PACEMAKER IMPLANT N/A 10/11/2021   Procedure: PACEMAKER IMPLANT;  Surgeon: Waddell Danelle ORN, MD;  Location: [facility] INVASIVE CV LAB;  Service: Cardiovascular;  Laterality: N/A;    RIGHT URETER OBSTRUCTION SURGERY  age 25   TEE WITHOUT CARDIOVERSION N/A 07/17/2013   Procedure: TRANSESOPHAGEAL ECHOCARDIOGRAM (TEE);  Surgeon: Maude JAYSON Emmer, MD;  Location: [facility] ENDOSCOPY;  Service: Cardiovascular;  Laterality: N/A;  normal LV size, mild prolapse of posterior mitral valve leaflet,  mild MR and TR, normal AV,  no LAA thrombus,  atrial septal aneurysm with no PFO/ ASD and negative bubble study, normal RV, normal aorta with no debris    FAMILY HISTORY:  Family History  Problem Relation Age of Onset   Heart disease Mother        PPM   Hypertension Mother    Lymphoma Father    Prostate cancer Father    Prostate cancer Brother    Aneurysm Brother        REPAIR   CAD Brother        CABG   Other Brother        STENTS IN LEGS   Other Sister        STOMACH ISSUES   Depression Sister    Lung cancer Brother        Asbestosis    SOCIAL HISTORY:  Social History   Socioeconomic History   Marital status: Married    Spouse name: maxine   Number of children: 4   Years of education: college4   Highest education level: Not on file  Occupational History   Occupation: retired  Tobacco Use   Smoking status: Former    Current packs/day: 0.00    Average packs/day: 1 pack/day for 35.0 years (35.0 ttl pk-yrs)    Types: Cigarettes    Start date: 08/29/1942    Quit date: 08/29/1977    Years since quitting: 46.1   Smokeless tobacco: Never  Substance and Sexual Activity   Alcohol  use: No    Alcohol /week: 0.0 standard drinks of alcohol    Drug use: No   Sexual activity: Not on file  Other Topics Concern   Not on file  Social History Narrative   Patient is married with 4 children.   Patient is right handed.   Patient has college education.   Patient drinks decaff coffee.   Social Drivers of Corporate Investment Banker Strain: Not on file  Food Insecurity: Not on file  Transportation Needs: Not on file  Physical Activity: Not on file  Stress: Not on file  Social  Connections: Not on file  Intimate Partner Violence: Not on file    ALLERGIES: Cephalexin, Iodinated contrast media, Iohexol , Ace inhibitors, Metoprolol , Viagra [sildenafil citrate], Ciprofloxacin , Codeine, and Sulfa antibiotics  MEDICATIONS:  Current Outpatient Medications  Medication Sig Dispense Refill   amiodarone  (PACERONE ) 200 MG tablet Take 1 tablet (200 mg total) by mouth daily. 180 tablet 3   Bacitracin (ANTIBIOTIC EX) Apply 1 Application topically daily as needed (wound care).  carvedilol  (COREG ) 3.125 MG tablet TAKE 1 TABLET BY MOUTH TWICE A DAY WITH MEALS 60 tablet 10   cholecalciferol (VITAMIN D) 25 MCG (1000 UNIT) tablet Take 1,000 Units by mouth in the morning.     COD LIVER OIL PO Take 1 capsule by mouth at bedtime.      empagliflozin  (JARDIANCE ) 10 MG TABS tablet Take 1 tablet (10 mg total) by mouth daily.     ezetimibe  (ZETIA ) 10 MG tablet Take 10 mg by mouth every morning.     famotidine (PEPCID) 40 MG tablet Take 40 mg by mouth daily.     loratadine (CLARITIN) 10 MG tablet Take 10 mg by mouth daily as needed for allergies.     Multiple Vitamin (MULTIVITAMIN WITH MINERALS) TABS tablet Take 1 tablet by mouth in the morning.     Polyethyl Glycol-Propyl Glycol (SYSTANE ULTRA OP) Place 1-2 drops into both eyes in the morning and at bedtime.     pravastatin  (PRAVACHOL ) 80 MG tablet Take 80 mg by mouth at bedtime.     saw palmetto 160 MG capsule Take 160 mg by mouth 2 (two) times daily.     TESTOSTERONE TD Place 1 mL onto the skin See admin instructions. 10% testosterone cream compounded at [facility]  - apply 1 ml topically to shoulders once every morning     torsemide  (DEMADEX ) 20 MG tablet Take 2 tablets (40 mg total) by mouth daily. 270 tablet 3   White Petrolatum (VASELINE EX) Apply 1 Application topically daily.     XARELTO  15 MG TABS tablet TAKE ONE (1) TABLET BY MOUTH EACH DAY WITH SUPPER 90 tablet 2   No current facility-administered medications for this  visit.    REVIEW OF SYSTEMS:  On review of systems, the patient reports that he is doing well overall. He denies any chest pain, shortness of breath, cough, fevers, chills, night sweats, unintended weight changes. He denies any bowel or bladder disturbances, and denies abdominal pain, nausea or vomiting. He denies any new musculoskeletal or joint aches or pains. A complete review of systems is obtained and is otherwise negative.    PHYSICAL EXAM:  Wt Readings from Last 3 Encounters:  09/19/23 171 lb 12.8 oz (77.9 kg)  08/07/23 175 lb 3.2 oz (79.5 kg)  07/16/23 174 lb 6.4 oz (79.1 kg)   Temp Readings from Last 3 Encounters:  08/07/23 97.7 F (36.5 C) (Oral)  08/31/22 97.6 F (36.4 C) (Temporal)  12/28/21 (!) 97.5 F (36.4 C) (Temporal)   BP Readings from Last 3 Encounters:  09/19/23 (!) 140/56  08/07/23 (!) 140/80  07/16/23 (!) 142/82   Pulse Readings from Last 3 Encounters:  09/19/23 60  08/07/23 60  07/16/23 61    /10  In general this is a well appearing Caucasian man in no acute distress.  He's alert and oriented x4 and appropriate throughout the examination. Cardiopulmonary assessment is negative for acute distress and he exhibits normal effort.   KPS = 100  100 - Normal; no complaints; no evidence of disease. 90   - Able to carry on normal activity; minor signs or symptoms of disease. 80   - Normal activity with effort; some signs or symptoms of disease. 16   - Cares for self; unable to carry on normal activity or to do active work. 60   - Requires occasional assistance, but is able to care for most of his personal needs. 50   - Requires considerable assistance and frequent medical care.  40   - Disabled; requires special care and assistance. 30   - Severely disabled; hospital admission is indicated although death not imminent. 20   - Very sick; hospital admission necessary; active supportive treatment necessary. 10   - Moribund; fatal processes progressing rapidly. 0      - Dead  Karnofsky DA, Abelmann WH, Craver LS and Burchenal [facility] [phone]) The use of the nitrogen mustards in the palliative treatment of carcinoma: with particular reference to bronchogenic carcinoma Cancer 1 634-56  LABORATORY DATA:  Lab Results  Component Value Date   WBC 5.7 11/09/2022   HGB 14.9 11/09/2022   HCT 47.0 11/09/2022   MCV 88 11/09/2022   PLT 138 (L) 11/09/2022   Lab Results  Component Value Date   NA 139 11/09/2022   K 3.6 11/09/2022   CL 100 11/09/2022   CO2 24 11/09/2022   Lab Results  Component Value Date   ALT 26 11/09/2022   AST 31 11/09/2022   ALKPHOS 119 11/09/2022   BILITOT 0.7 11/09/2022     RADIOGRAPHY: No results found.    IMPRESSION/PLAN: 1. 89 y.o. man with putative Stage IA NSCLC in the RUL lung.  Today, we talked to the patient and family about the findings and workup thus far. We discussed the natural history of early-stage non-small cell lung carcinoma and general treatment, highlighting the role of radiotherapy in the management. We discussed the available radiation techniques, and focused on the details and logistics of delivery.  The recommendation is for a 5 fraction course of focused SBRT to the RUL lung nodule.  We reviewed the anticipated acute and late sequelae associated with radiation in this setting. The patient was encouraged to ask questions that were answered to his stated satisfaction.  He appears to have a good understanding of his disease and our treatment recommendations which are of curative intent.  The nodule is close to the esophagus, which would prohibit the higher doses used in a 3-fraction regimen, and should be amenable to 5-fraction SBRT.  That being said, some transient esophagitis for 2 weeks may occur after SBRT.  At the conclusion of our conversation, the patient is willing to consider the recommended 5 fraction course of focused SBRT.  Prior to committing to SBRT, he would like to pursue a short-interval follow-up  Chest CT in 2 months to confirm persistence/growth.  I will share my discussion with Dr. Shelah who is scheduled to see the patient in follow-up on 11/21/2023.  Since the Chest CT was on 06/08/23 and PET CT on 08/16/23, it may be reasonable to get the next Chest CT just prior to Dr. Lanny visit in mid-March.  We also briefly discussed the patient's recent elevated PSA of 6.  He reports that it has increased in the past year.  He was curious of the PET-CT would shed any light on the rising PSA.  I did pull up the CT and PET scan and scrolled through the imaging with the patient highlighting the lung nodule and then also looking closely at the prostate.  There are no overt hypermetabolic foci in the prostate.  But, I advised the patient that FDG PET scans are not typically helpful to diagnose or stage prostate cancer, since it has a lower metabolism than other malignancies.  I explained if he does have a rising PSA and wants to have peace of mind that he does not have prostate cancer, he may need to meet a urologist for a discussion about possible  biopsy.  We also discussed how a PSA of higher than 4 is common in men at his age.  So, my index of suspicion for prostate cancer would be relatively low and ongoing PSA surveillance with Dr. Windy is reasonable.  I enjoyed meeting him today and look forward to continuing to participate in his care.  I personally spent 60 minutes in this encounter including chart review, reviewing radiological studies, meeting face-to-face with the patient, entering orders and completing documentation.   ------------------------------------------------   Donnice Barge, MD [facility]  Radiation Oncology Direct Dial: [phone]  Fax: [phone] [email or web address]  Skype  LinkedIn

## 2023-10-02 ENCOUNTER — Ambulatory Visit
Admission: RE | Admit: 2023-10-02 | Discharge: 2023-10-02 | Disposition: A | Discharge status: Home / Self Care | Payer: Medicare Other | Source: Ambulatory Visit | Attending: Radiation Oncology | Admitting: Radiation Oncology

## 2023-10-02 ENCOUNTER — Encounter: Payer: Self-pay | Admitting: Radiation Oncology

## 2023-10-02 ENCOUNTER — Encounter: Payer: Self-pay | Admitting: Internal Medicine

## 2023-10-02 VITALS — BP 129/66 | HR 57 | Temp 97.5°F | Resp 18 | Ht 69.5 in | Wt 170.6 lb

## 2023-10-02 DIAGNOSIS — G4730 Sleep apnea, unspecified: Secondary | ICD-10-CM | POA: Insufficient documentation

## 2023-10-02 DIAGNOSIS — R911 Solitary pulmonary nodule: Secondary | ICD-10-CM

## 2023-10-02 DIAGNOSIS — Z79899 Other long term (current) drug therapy: Secondary | ICD-10-CM | POA: Insufficient documentation

## 2023-10-02 DIAGNOSIS — I10 Essential (primary) hypertension: Secondary | ICD-10-CM | POA: Diagnosis not present

## 2023-10-02 DIAGNOSIS — Z7984 Long term (current) use of oral hypoglycemic drugs: Secondary | ICD-10-CM | POA: Insufficient documentation

## 2023-10-02 DIAGNOSIS — I110 Hypertensive heart disease with heart failure: Secondary | ICD-10-CM | POA: Insufficient documentation

## 2023-10-02 DIAGNOSIS — I6523 Occlusion and stenosis of bilateral carotid arteries: Secondary | ICD-10-CM | POA: Insufficient documentation

## 2023-10-02 DIAGNOSIS — I480 Paroxysmal atrial fibrillation: Secondary | ICD-10-CM | POA: Diagnosis not present

## 2023-10-02 DIAGNOSIS — N529 Male erectile dysfunction, unspecified: Secondary | ICD-10-CM | POA: Insufficient documentation

## 2023-10-02 DIAGNOSIS — C3411 Malignant neoplasm of upper lobe, right bronchus or lung: Secondary | ICD-10-CM | POA: Diagnosis present

## 2023-10-02 DIAGNOSIS — I2510 Atherosclerotic heart disease of native coronary artery without angina pectoris: Secondary | ICD-10-CM | POA: Insufficient documentation

## 2023-10-02 DIAGNOSIS — Z7901 Long term (current) use of anticoagulants: Secondary | ICD-10-CM | POA: Diagnosis not present

## 2023-10-02 DIAGNOSIS — K219 Gastro-esophageal reflux disease without esophagitis: Secondary | ICD-10-CM | POA: Diagnosis not present

## 2023-10-02 DIAGNOSIS — Z87891 Personal history of nicotine dependence: Secondary | ICD-10-CM | POA: Diagnosis not present

## 2023-10-02 NOTE — Progress Notes (Signed)
 TO BE COMPLETED BY RADIATION ONCOLOGIST OFFICE:   Patient Name: Jose [name]   Date of Birth: [birth year]   Radiation Oncologist: Dr. Donnice Barge   Site to be Treated: right Upper Lung  Will x-rays >10 MV be used? No  Will the radiation be >10 cm from the device? Yes  Planned Treatment Start Date: 1 Week  TO BE COMPLETED BY CARDIOLOGIST OFFICE:   Device Information:  Pacemaker [x]      ICD []    Brand: Medtronic: [phone] Model #: Medtronic [id number] [address] MRI  Serial Number: [id number]     Date of Placement: 10/11/2021  Site of Placement: Left Chest  Remote Device Check--Frequency: Every 91 days   Last Check: 07/11/2023  Is the Patient Pacer Dependent?: Unknown Does cardiologist request Radiation Oncology to schedule device testing by vendor for the following:  Prior to the Initiation of Treatments?  Yes [x]  No []  During Treatments?  Yes []  No [x]  Post Radiation Treatments?  Yes [x]  No []   Is device monitoring necessary by vendor/cardiologist team during treatments?  Yes []   No [x]   Is cardiac monitoring by Radiation Oncology nursing necessary during treatments? Yes [x]   No []   Do you recommend device be relocated prior to Radiation Treatment? Yes []   No [x]   **PLEASE LIST ANY NOTES OR SPECIAL REQUESTS:       CARDIOLOGIST SIGNATURE:  Dr. Danelle Birmingham Per Device Clinic Standing Orders, [name]  10/02/2023 11:36 AM  **Please route completed form back to Radiation Oncology Nursing and P [facility] RAD ONC ADMIN, OR send an update if there will be a delay in having form completed by expected start date.  **Call [phone] if you have any questions or do not get an in-basket response from a Radiation Oncology staff member

## 2023-10-03 ENCOUNTER — Other Ambulatory Visit: Payer: Self-pay | Admitting: Cardiovascular Disease

## 2023-10-03 DIAGNOSIS — I480 Paroxysmal atrial fibrillation: Secondary | ICD-10-CM

## 2023-10-05 ENCOUNTER — Encounter: Payer: Self-pay | Admitting: Radiation Oncology

## 2023-10-05 NOTE — Progress Notes (Signed)
  Radiation Oncology         [phone] ________________________________  Name: Jose [name] MRN: [id number]  Date: 10/05/2023  DOB: Mar 15, 1931  Chart Note:  Patient was seen in consultation on 2/4, undecided about whether to proceed with 5-fraction SBRT  He called back today, and has decided to proceed  We will call him to schedule CT sim  ________________________________  Jose [name], M.D.

## 2023-10-11 ENCOUNTER — Ambulatory Visit (INDEPENDENT_AMBULATORY_CARE_PROVIDER_SITE_OTHER): Payer: PRIVATE HEALTH INSURANCE

## 2023-10-11 DIAGNOSIS — I255 Ischemic cardiomyopathy: Secondary | ICD-10-CM | POA: Diagnosis not present

## 2023-10-12 ENCOUNTER — Ambulatory Visit
Admission: RE | Admit: 2023-10-12 | Discharge: 2023-10-12 | Disposition: A | Discharge status: Home / Self Care | Payer: Medicare Other | Source: Ambulatory Visit | Attending: Urology | Admitting: Urology

## 2023-10-12 DIAGNOSIS — Z510 Encounter for antineoplastic radiation therapy: Secondary | ICD-10-CM | POA: Diagnosis present

## 2023-10-12 DIAGNOSIS — C3411 Malignant neoplasm of upper lobe, right bronchus or lung: Secondary | ICD-10-CM | POA: Diagnosis present

## 2023-10-12 NOTE — Progress Notes (Signed)
  Radiation Oncology         [phone] ________________________________  Name: Jose [name] MRN: [id number]  Date: 10/12/2023  DOB: September 06, 1930  STEREOTACTIC BODY RADIOTHERAPY SIMULATION AND TREATMENT PLANNING NOTE    ICD-10-CM   1. Primary non-small cell carcinoma of upper lobe of right lung (HCC)  C34.11       DIAGNOSIS:  89 yo man with putative Stage IA NSCLC in the RUL lung   NARRATIVE:  The patient was brought to the CT Simulation planning suite.  Identity was confirmed.  All relevant records and images related to the planned course of therapy were reviewed.  The patient freely provided informed written consent to proceed with treatment after reviewing the details related to the planned course of therapy. The consent form was witnessed and verified by the simulation staff.  Then, the patient was set-up in a stable reproducible  supine position for radiation therapy.  A BodyFix immobilization pillow was fabricated for reproducible positioning.  Then I personally applied the abdominal compression paddle to limit respiratory excursion.  4D respiratoy motion management CT images were obtained.  Surface markings were placed.  The CT images were loaded into the planning software.  Then, using Cine, MIP, and standard views, the internal target volume (ITV) and planning target volumes (PTV) were delinieated, and avoidance structures were contoured.  Treatment planning then occurred.  The radiation prescription was entered and confirmed.  A total of two complex treatment devices were fabricated in the form of the BodyFix immobilization pillow and a neck accuform cushion.  I have requested : 3D Simulation  I have requested a DVH of the following structures: Heart, Lungs, Esophagus, Chest Wall, Brachial Plexus, Major Blood Vessels, and targets.  SPECIAL TREATMENT PROCEDURE:  The planned course of therapy using radiation constitutes a special treatment procedure. Special care is required in the  management of this patient for the following reasons. This treatment constitutes a Special Treatment Procedure for the following reason: [ High dose per fraction requiring special monitoring for increased toxicities of treatment including daily imaging..  The special nature of the planned course of radiotherapy will require increased physician supervision and oversight to ensure patient's safety with optimal treatment outcomes.  This requires extended time and effort.    RESPIRATORY MOTION MANAGEMENT SIMULATION:  In order to account for effect of respiratory motion on target structures and other organs in the planning and delivery of radiotherapy, this patient underwent respiratory motion management simulation.  To accomplish this, when the patient was brought to the CT simulation planning suite, 4D respiratoy motion management CT images were obtained.  The CT images were loaded into the planning software.  Then, using a variety of tools including Cine, MIP, and standard views, the target volume and planning target volumes (PTV) were delineated.  Avoidance structures were contoured.  Treatment planning then occurred.  Dose volume histograms were generated and reviewed for each of the requested structure.  The resulting plan was carefully reviewed and approved today.  PLAN:  The patient will receive 55 Gy in 5 fractions to the nodule in the RUL lung.  ________________________________  Artist Pais. Kathrynn Running, M.D.

## 2023-10-13 LAB — CUP PACEART REMOTE DEVICE CHECK
Battery Remaining Longevity: 127 mo
Battery Voltage: 3.02 V
Brady Statistic AP VP Percent: 87.9 %
Brady Statistic AP VS Percent: 11.41 %
Brady Statistic AS VP Percent: 0.1 %
Brady Statistic AS VS Percent: 0.59 %
Brady Statistic RA Percent Paced: 99.79 %
Brady Statistic RV Percent Paced: 88 %
Date Time Interrogation Session: 20250213073728
Implantable Lead Connection Status: 753985
Implantable Lead Connection Status: 753985
Implantable Lead Implant Date: 20230214
Implantable Lead Implant Date: 20230214
Implantable Lead Location: 753859
Implantable Lead Location: 753860
Implantable Lead Model: 3830
Implantable Lead Model: 5076
Implantable Pulse Generator Implant Date: 20230214
Lead Channel Impedance Value: 304 Ohm
Lead Channel Impedance Value: 361 Ohm
Lead Channel Impedance Value: 456 Ohm
Lead Channel Impedance Value: 494 Ohm
Lead Channel Pacing Threshold Amplitude: 0.5 V
Lead Channel Pacing Threshold Amplitude: 0.75 V
Lead Channel Pacing Threshold Pulse Width: 0.4 ms
Lead Channel Pacing Threshold Pulse Width: 0.4 ms
Lead Channel Sensing Intrinsic Amplitude: 12.125 mV
Lead Channel Sensing Intrinsic Amplitude: 12.125 mV
Lead Channel Sensing Intrinsic Amplitude: 4.125 mV
Lead Channel Sensing Intrinsic Amplitude: 4.125 mV
Lead Channel Setting Pacing Amplitude: 2 V
Lead Channel Setting Pacing Amplitude: 2 V
Lead Channel Setting Pacing Pulse Width: 0.4 ms
Lead Channel Setting Sensing Sensitivity: 0.9 mV
Zone Setting Status: 755011
Zone Setting Status: 755011

## 2023-10-14 ENCOUNTER — Encounter: Payer: Self-pay | Admitting: Internal Medicine

## 2023-10-17 ENCOUNTER — Other Ambulatory Visit: Payer: Self-pay | Admitting: Cardiology

## 2023-10-17 ENCOUNTER — Telehealth: Payer: Self-pay | Admitting: Cardiology

## 2023-10-17 DIAGNOSIS — I5032 Chronic diastolic (congestive) heart failure: Secondary | ICD-10-CM

## 2023-10-17 MED ORDER — TORSEMIDE 20 MG PO TABS
40.0000 mg | ORAL_TABLET | Freq: Every day | ORAL | 2 refills | Status: DC
Start: 2023-10-17 — End: 2024-07-03

## 2023-10-17 NOTE — Telephone Encounter (Signed)
*  STAT* If patient is at the pharmacy, call can be transferred to refill team.   1. Which medications need to be refilled? (please list name of each medication and dose if known) torsemide (DEMADEX) 20 MG tablet    2. Would you like to learn more about the convenience, safety, & potential cost savings by using the [facility]?      3. Are you open to using the [facility] (Type [facility]. ).   4. Which pharmacy/location (including street and city if local pharmacy) is medication to be sent to? [facility] [place], [place] - [address]    5. Do they need a 30 day or 90 day supply? 90

## 2023-10-17 NOTE — Telephone Encounter (Signed)
 Pt's medication was sent to pt's pharmacy as requested. Confirmation received.

## 2023-10-22 DIAGNOSIS — Z510 Encounter for antineoplastic radiation therapy: Secondary | ICD-10-CM | POA: Diagnosis not present

## 2023-10-22 NOTE — Progress Notes (Unsigned)
  Cardiology Office Note:  .   Date:  10/22/2023  ID:  Vinetta Bergamo, DOB 03/24/1931, MRN [id number] PCP: Mosetta Putt, MD  [facility] Providers Cardiologist:  Olga Millers, MD Electrophysiologist:  Maurice Small, MD { Nephrology: Dr. Kathrene Bongo  History of Present Illness: .   LEROI HAQUE is a 89 y.o. male w/PMHx of CAD (CABG w/PFO closure 2007), symptomatic bradycardia w/PPM, AFib, stroke, CKD (IIIb)  Saw Dr. Nelly Laurence 07/17/22, bleeding issues with his varicose veins, improved s/p sclerosis procedures, asked about stopping his OAC No symptoms of Afib Not a candidate for AFib ablation or LAAO (with report of having surgical PFO closure and advanced age) To f/u with his gen cards team for DCCV   Seeing Dr. Ala Bent  DCCV1/4/24 [place] was with Dr. Jens Som 07/16/23, discussed a CT chest noted with suspicious nodule RUL No cardiac complaints Planned to try Jardiance, hi BP and renal function limiting GDMT  Today's visit is scheduled as an annual visit  ROS:  *** device only *** AF burden *** amio labs *** Xarelto, dose, labs, bleeding   Device information MDT dual chamber PPM implanted 10/11/21  Arrhythmia/AAD hx Rate control strategy developed after failed Amio Amio restarted Nov 2023  Studies Reviewed: Marland Kitchen    EKG not done today  DEVICE interrogation done today and reviewed by myself *** Battery and lead measurements are good ***   06/11/2023: TTE 1. Hypokinesis/akinesis of the inferior, septal, inferolateral  walls(base, mid). Left ventricular ejection fraction, by estimation, is 35  to 40%. The left ventricle has moderately decreased function. The left  ventricular internal cavity size was mildly  dilated. There is mild left ventricular hypertrophy.   2. Right ventricular systolic function is normal. The right ventricular  size is normal.   3. MR is eccentric, directed posteriorly. . Mild mitral valve  regurgitation.   4. The aortic  valve is tricuspid. Aortic valve regurgitation is not  visualized. Aortic valve sclerosis/calcification is present, without any  evidence of aortic stenosis.   5. The inferior vena cava is normal in size with greater than 50%  respiratory variability, suggesting right atrial pressure of 3 mmHg.   Comparison(s): The left ventricular function has improved.    Risk Assessment/Calculations:    Physical Exam:   VS:  There were no vitals taken for this visit.   Wt Readings from Last 3 Encounters:  10/02/23 170 lb 9.6 oz (77.4 kg)  09/19/23 171 lb 12.8 oz (77.9 kg)  08/07/23 175 lb 3.2 oz (79.5 kg)    GEN: Well nourished, well developed in no acute distress NECK: No JVD; No carotid bruits CARDIAC: ***RRR, no murmurs, rubs, gallops RESPIRATORY:  *** CTA b/l without rales, wheezing or rhonchi  ABDOMEN: Soft, non-tender, non-distended EXTREMITIES: *** No edema; No deformity   PPM site: *** is stable, no thinning, fluctuation, tethering  ASSESSMENT AND PLAN: .    PPM *** intact function *** no programming changes made  Persistent  AFib CHA2DS2Vasc is 5, on Xarelto, *** appropriately dosed *** % burden Chronic amiodarone  CAD *** C/w Dr. Ala Bent  Secondary hypercoagulable state 2/2 AFib     {Are you ordering a CV Procedure (e.g. stress test, cath, DCCV, TEE, etc)?   Press F2        :[id number]}     Dispo: ***  Signed, Sheilah Pigeon, PA-C

## 2023-10-24 ENCOUNTER — Encounter: Payer: Self-pay | Admitting: Internal Medicine

## 2023-10-24 ENCOUNTER — Ambulatory Visit: Payer: Medicare Other | Attending: Physician Assistant | Admitting: Physician Assistant

## 2023-10-24 ENCOUNTER — Encounter: Payer: Self-pay | Admitting: Physician Assistant

## 2023-10-24 ENCOUNTER — Ambulatory Visit
Admission: RE | Admit: 2023-10-24 | Discharge: 2023-10-24 | Disposition: A | Discharge status: Home / Self Care | Payer: Medicare Other | Source: Ambulatory Visit | Attending: Radiation Oncology | Admitting: Radiation Oncology

## 2023-10-24 ENCOUNTER — Other Ambulatory Visit: Payer: Self-pay

## 2023-10-24 VITALS — BP 128/76 | HR 60 | Ht 69.5 in | Wt 167.0 lb

## 2023-10-24 DIAGNOSIS — Z79899 Other long term (current) drug therapy: Secondary | ICD-10-CM | POA: Diagnosis present

## 2023-10-24 DIAGNOSIS — Z950 Presence of cardiac pacemaker: Secondary | ICD-10-CM

## 2023-10-24 DIAGNOSIS — Z510 Encounter for antineoplastic radiation therapy: Secondary | ICD-10-CM | POA: Diagnosis not present

## 2023-10-24 DIAGNOSIS — I4819 Other persistent atrial fibrillation: Secondary | ICD-10-CM

## 2023-10-24 DIAGNOSIS — D6869 Other thrombophilia: Secondary | ICD-10-CM | POA: Diagnosis present

## 2023-10-24 DIAGNOSIS — I2510 Atherosclerotic heart disease of native coronary artery without angina pectoris: Secondary | ICD-10-CM

## 2023-10-24 LAB — CUP PACEART INCLINIC DEVICE CHECK
Battery Remaining Longevity: 120 mo
Battery Voltage: 3.02 V
Brady Statistic AP VP Percent: 68.54 %
Brady Statistic AP VS Percent: 31.01 %
Brady Statistic AS VP Percent: 0.06 %
Brady Statistic AS VS Percent: 0.4 %
Brady Statistic RA Percent Paced: 99.86 %
Brady Statistic RV Percent Paced: 68.6 %
Date Time Interrogation Session: 20250226171958
Implantable Lead Connection Status: 753985
Implantable Lead Connection Status: 753985
Implantable Lead Implant Date: 20230214
Implantable Lead Implant Date: 20230214
Implantable Lead Location: 753859
Implantable Lead Location: 753860
Implantable Lead Model: 3830
Implantable Lead Model: 5076
Implantable Pulse Generator Implant Date: 20230214
Lead Channel Impedance Value: 361 Ohm
Lead Channel Impedance Value: 380 Ohm
Lead Channel Impedance Value: 532 Ohm
Lead Channel Impedance Value: 532 Ohm
Lead Channel Pacing Threshold Amplitude: 0.5 V
Lead Channel Pacing Threshold Amplitude: 0.875 V
Lead Channel Pacing Threshold Pulse Width: 0.4 ms
Lead Channel Pacing Threshold Pulse Width: 0.4 ms
Lead Channel Sensing Intrinsic Amplitude: 12.125 mV
Lead Channel Sensing Intrinsic Amplitude: 12.125 mV
Lead Channel Sensing Intrinsic Amplitude: 4.125 mV
Lead Channel Sensing Intrinsic Amplitude: 4.125 mV
Lead Channel Setting Pacing Amplitude: 2 V
Lead Channel Setting Pacing Amplitude: 2.5 V
Lead Channel Setting Pacing Pulse Width: 0.4 ms
Lead Channel Setting Sensing Sensitivity: 0.9 mV
Zone Setting Status: 755011
Zone Setting Status: 755011

## 2023-10-24 LAB — RAD ONC ARIA SESSION SUMMARY
Course Elapsed Days: 0
Plan Fractions Treated to Date: 1
Plan Prescribed Dose Per Fraction: 11 Gy
Plan Total Fractions Prescribed: 5
Plan Total Prescribed Dose: 55 Gy
Reference Point Dosage Given to Date: 11 Gy
Reference Point Session Dosage Given: 11 Gy
Session Number: 1

## 2023-10-24 NOTE — Progress Notes (Signed)
 O BE COMPLETED BY RADIATION ONCOLOGIST OFFICE:    Patient Name: Jose [name]    Date of Birth: 1931/04/14    Radiation Oncologist: Dr. Margaretmary Dys    Site to be Treated: right Upper Lung   Will x-rays >10 MV be used? No   Will the radiation be >10 cm from the device? Yes   Planned Treatment Start Date: 1 Week     TO BE COMPLETED BY CARDIOLOGIST OFFICE:   Device Information:  Pacemaker [x]      ICD []    Brand: Medtronic: [phone] Model #: Medtronic [id number] [name] DR MRI  Serial Number: [id number]     Date of Placement: 10/11/2021  Site of Placement: left chest  Remote Device Check--Frequency: q 91 days   Last Check: 10/24/2023  Is the Patient Pacer Dependent?:  Yes [x]   No []   Does cardiologist request Radiation Oncology to schedule device testing by vendor for the following:  Prior to the Initiation of Treatments?  Yes []  No [x]  During Treatments?  Yes []  No [x]  Post Radiation Treatments?  Yes [x]  No []   Is device monitoring necessary by vendor/cardiologist team during treatments?  Yes []   No [x]   Is cardiac monitoring by Radiation Oncology nursing necessary during treatments? Yes []   No [x]   Do you recommend device be relocated prior to Radiation Treatment? Yes []   No [x]   **PLEASE LIST ANY NOTES OR SPECIAL REQUESTS: Will schedule follow up post radiation tx.  Last tx 11/02/2023.     CARDIOLOGIST SIGNATURE:  Dr. Lewayne Bunting Per Device Clinic Standing Orders, Wiliam Ke  10/24/2023 12:00 PM  **Please route completed form back to Radiation Oncology Nursing and "P [facility] RAD ONC ADMIN", OR send an update if there will be a delay in having form completed by expected start date.  **Call [phone] if you have any questions or do not get an in-basket response from a Radiation Oncology staff member

## 2023-10-24 NOTE — Patient Instructions (Addendum)
 Medication Instructions:   Your physician recommends that you continue on your current medications as directed. Please refer to the Current Medication list given to you today.   *If you need a refill on your cardiac medications before your next appointment, please call your pharmacy*    Lab Work:   PLEASE GO DOWN STAIRS  [facility]  FIRST FLOOR  [address] ( GET OFF ELEVATORS MAKE A LEFT AND ANOTHER LEFT LAB ON RIGHT DOWN HALLWAY : LFT CBC AND TSH TODAY     If you have labs (blood work) drawn today and your tests are completely normal, you will receive your results only by: MyChart Message (if you have MyChart) OR A paper copy in the mail If you have any lab test that is abnormal or we need to change your treatment, we will call you to review the results.   Testing/Procedures:  NONE ORDERED  TODAY     Follow-Up: At [facility], you and your health needs are our priority.  As part of our continuing mission to provide you with exceptional heart care, we have created designated Provider Care Teams.  These Care Teams include your primary Cardiologist (physician) and Advanced Practice Providers (APPs -  Physician Assistants and Nurse Practitioners) who all work together to provide you with the care you need, when you need it.  We recommend signing up for the patient portal called "MyChart".  Sign up information is provided on this After Visit Summary.  MyChart is used to connect with patients for Virtual Visits (Telemedicine).  Patients are able to view lab/test results, encounter notes, upcoming appointments, etc.  Non-urgent messages can be sent to your provider as well.   To learn more about what you can do with MyChart, go to [email or web address].    Your next appointment:     1 month(s) ( CONTACT  CASSIE HALL/ ANGELINE HAMMER FOR EP SCHEDULING ISSUES )  Provider:    You may see Maurice Small, MD or one of the following Advanced Practice Providers on your designated  Care Team:   Francis Dowse, [place]    Other Instructions

## 2023-10-25 ENCOUNTER — Ambulatory Visit: Payer: Medicare Other

## 2023-10-25 LAB — HEPATIC FUNCTION PANEL
ALT: 22 IU/L (ref 0–44)
AST: 33 IU/L (ref 0–40)
Albumin: 4.2 g/dL (ref 3.6–4.6)
Alkaline Phosphatase: 133 IU/L — ABNORMAL HIGH (ref 44–121)
Bilirubin Total: 0.8 mg/dL (ref 0.0–1.2)
Bilirubin, Direct: 0.22 mg/dL (ref 0.00–0.40)
Total Protein: 7.6 g/dL (ref 6.0–8.5)

## 2023-10-25 LAB — TSH: TSH: 1.27 u[IU]/mL (ref 0.450–4.500)

## 2023-10-25 LAB — CBC
Hematocrit: 48.2 % (ref 37.5–51.0)
Hemoglobin: 15.4 g/dL (ref 13.0–17.7)
MCH: 29.7 pg (ref 26.6–33.0)
MCHC: 32 g/dL (ref 31.5–35.7)
MCV: 93 fL (ref 79–97)
Platelets: 117 10*3/uL — ABNORMAL LOW (ref 150–450)
RBC: 5.19 x10E6/uL (ref 4.14–5.80)
RDW: 13.7 % (ref 11.6–15.4)
WBC: 5.5 10*3/uL (ref 3.4–10.8)

## 2023-10-26 ENCOUNTER — Ambulatory Visit
Admission: RE | Admit: 2023-10-26 | Discharge: 2023-10-26 | Disposition: A | Discharge status: Home / Self Care | Payer: Medicare Other | Source: Ambulatory Visit | Attending: Radiation Oncology | Admitting: Radiation Oncology

## 2023-10-26 ENCOUNTER — Other Ambulatory Visit: Payer: Self-pay

## 2023-10-26 DIAGNOSIS — Z510 Encounter for antineoplastic radiation therapy: Secondary | ICD-10-CM | POA: Diagnosis not present

## 2023-10-26 LAB — RAD ONC ARIA SESSION SUMMARY
Course Elapsed Days: 2
Plan Fractions Treated to Date: 2
Plan Prescribed Dose Per Fraction: 11 Gy
Plan Total Fractions Prescribed: 5
Plan Total Prescribed Dose: 55 Gy
Reference Point Dosage Given to Date: 22 Gy
Reference Point Session Dosage Given: 11 Gy
Session Number: 2

## 2023-10-29 ENCOUNTER — Ambulatory Visit
Admission: RE | Admit: 2023-10-29 | Discharge: 2023-10-29 | Disposition: A | Discharge status: Home / Self Care | Payer: Medicare Other | Source: Ambulatory Visit | Attending: Radiation Oncology | Admitting: Radiation Oncology

## 2023-10-29 ENCOUNTER — Other Ambulatory Visit: Payer: Self-pay

## 2023-10-29 DIAGNOSIS — C3411 Malignant neoplasm of upper lobe, right bronchus or lung: Secondary | ICD-10-CM | POA: Diagnosis present

## 2023-10-29 DIAGNOSIS — Z510 Encounter for antineoplastic radiation therapy: Secondary | ICD-10-CM | POA: Insufficient documentation

## 2023-10-29 LAB — RAD ONC ARIA SESSION SUMMARY
Course Elapsed Days: 5
Plan Fractions Treated to Date: 3
Plan Prescribed Dose Per Fraction: 11 Gy
Plan Total Fractions Prescribed: 5
Plan Total Prescribed Dose: 55 Gy
Reference Point Dosage Given to Date: 33 Gy
Reference Point Session Dosage Given: 11 Gy
Session Number: 3

## 2023-10-30 ENCOUNTER — Ambulatory Visit: Payer: Medicare Other

## 2023-10-31 ENCOUNTER — Ambulatory Visit
Admission: RE | Admit: 2023-10-31 | Discharge: 2023-10-31 | Disposition: A | Discharge status: Home / Self Care | Payer: Medicare Other | Source: Ambulatory Visit | Attending: Radiation Oncology

## 2023-10-31 ENCOUNTER — Other Ambulatory Visit: Payer: Self-pay

## 2023-10-31 DIAGNOSIS — Z510 Encounter for antineoplastic radiation therapy: Secondary | ICD-10-CM | POA: Diagnosis not present

## 2023-10-31 LAB — RAD ONC ARIA SESSION SUMMARY
Course Elapsed Days: 7
Plan Fractions Treated to Date: 4
Plan Prescribed Dose Per Fraction: 11 Gy
Plan Total Fractions Prescribed: 5
Plan Total Prescribed Dose: 55 Gy
Reference Point Dosage Given to Date: 44 Gy
Reference Point Session Dosage Given: 11 Gy
Session Number: 4

## 2023-11-02 ENCOUNTER — Ambulatory Visit
Admission: RE | Admit: 2023-11-02 | Discharge: 2023-11-02 | Disposition: A | Discharge status: Home / Self Care | Payer: Medicare Other | Source: Ambulatory Visit | Attending: Radiation Oncology | Admitting: Radiation Oncology

## 2023-11-02 ENCOUNTER — Other Ambulatory Visit: Payer: Self-pay

## 2023-11-02 DIAGNOSIS — Z510 Encounter for antineoplastic radiation therapy: Secondary | ICD-10-CM | POA: Diagnosis not present

## 2023-11-02 DIAGNOSIS — C3411 Malignant neoplasm of upper lobe, right bronchus or lung: Secondary | ICD-10-CM

## 2023-11-02 LAB — RAD ONC ARIA SESSION SUMMARY
Course Elapsed Days: 9
Plan Fractions Treated to Date: 5
Plan Prescribed Dose Per Fraction: 11 Gy
Plan Total Fractions Prescribed: 5
Plan Total Prescribed Dose: 55 Gy
Reference Point Dosage Given to Date: 55 Gy
Reference Point Session Dosage Given: 11 Gy
Session Number: 5

## 2023-11-05 ENCOUNTER — Ambulatory Visit: Payer: Medicare Other

## 2023-11-05 DIAGNOSIS — I480 Paroxysmal atrial fibrillation: Secondary | ICD-10-CM

## 2023-11-05 NOTE — Radiation Completion Notes (Addendum)
  Radiation Oncology         [phone] ________________________________  Name: Jose [name] MRN: [id number]  Date: 11/02/2023  DOB: [birth year]  Referring Physician: Levy Pupa, M.D. Date of Service: 2023-11-05 Radiation Oncologist: Margaretmary Bayley, M.D. [facility]     RADIATION ONCOLOGY END OF TREATMENT NOTE     Diagnosis: 89 yo man with putative Stage IA NSCLC in the RUL lung   Intent: Curative     ==========DELIVERED PLANS==========  First Treatment Date: 2023-10-24 Last Treatment Date: 2023-11-02   Plan Name: Lung_R_SBRT Site: Lung, Right Technique: SBRT/SRT-IMRT Mode: Photon Dose Per Fraction: 11 Gy Prescribed Dose (Delivered / Prescribed): 55 Gy / 55 Gy Prescribed Fxs (Delivered / Prescribed): 5 / 5     ==========ON TREATMENT VISIT DATES========== 2023-10-24, 2023-10-26, 2023-10-29, 2023-10-31, 2023-11-02, 2023-11-02     See weekly On Treatment Notes in Epic for details in the Media tab (listed as Progress notes on the On Treatment Visit Dates listed above).  He tolerated the treatments well with only modest fatigue.  The patient will receive a call in about one month from the radiation oncology department.  He is scheduled for a posttreatment CT chest scan on 02/04/2024 and a follow-up visit in the pulmonology office on 02/18/2024 to review those results.  We are happy to see him back on an as-needed basis.  ------------------------------------------------   Margaretmary Dys, MD [facility]  Radiation Oncology Direct Dial: [phone]  Fax: [phone] [email or web address]  Skype  [email or web address]

## 2023-11-13 NOTE — Progress Notes (Signed)
 Remote pacemaker transmission.

## 2023-11-13 NOTE — Addendum Note (Signed)
 Addended by: Elease Etienne A on: 11/13/2023 12:28 PM   Modules accepted: Orders

## 2023-11-21 ENCOUNTER — Ambulatory Visit (INDEPENDENT_AMBULATORY_CARE_PROVIDER_SITE_OTHER): Payer: Medicare Other | Admitting: Emergency Medicine

## 2023-11-21 ENCOUNTER — Encounter: Payer: Self-pay | Admitting: Emergency Medicine

## 2023-11-21 VITALS — BP 142/76 | HR 2 | Ht 69.5 in | Wt 172.0 lb

## 2023-11-21 DIAGNOSIS — C3411 Malignant neoplasm of upper lobe, right bronchus or lung: Secondary | ICD-10-CM

## 2023-11-21 NOTE — Patient Instructions (Signed)
 I am glad that you have done well with your radiation treatments. We will plan to repeat your CT scan of the chest in June 2025. Continue using your Claritin as needed for any allergy symptoms Follow with Dr. Delton Coombes in June after your CT chest so we can review those results together.

## 2023-11-21 NOTE — Progress Notes (Signed)
 Subjective:    Patient ID: Jose [name], male    DOB: 08/03/1931, 89 y.o.   MRN: [id number]  HPI 89 year old former smoker (35 pack years) with history of coronary artery disease/CABG, embolic CVA, hypertension, paroxysmal atrial fibrillation, GERD.  Has been seen in our office by Dr. Sherene Sires in the past for hypoxemic respiratory failure and cryptogenic organizing pneumonia, last in 2017. The patient is currently presenting for review of an abnormal CT scan of the chest.  The patient experienced a mechanical fall in September while working in the yard, resulting in a bruised rotator cuff. The fall prompted a CT scan of the head and cervical spine, which did not show any evidence of trauma but identified a 1.3 cm right upper lobe pulmonary nodule. A dedicated CT scan of the chest was performed in October, revealing biapical pleuroparenchymal scar, mild central lobular paraceptal emphysema, mild bilateral subpleural reticulation, and a 1.4 by 1.1 cm lobulated solid pulmonary nodule in the medial aspect of the apical right upper lobe. This nodule was not present in a CT chest done in 2017.  The patient reports ongoing shoulder discomfort from the fall, particularly when lifting objects in certain directions. He also mentions occasional coughing but denies any shortness of breath. The patient has a history of skin cancer with multiple surgeries and a slightly elevated PSA, raising concerns about possible prostate issues. The patient has not been exposed to any occupational lung hazards and has been a non-smoker since 1979.   ROV 09/19/23 --Jose [name] is a 89 year old gentleman who returns today to discuss an abnormal CT scan of the chest.  He has a history of CAD/CABG, CVA, hypertension, A-fib, GERD, former tobacco use. CT chest from 06/08/2023 showed a 1.4 x 1.1 lobulated apical right upper lobe nodule.  We performed a PET scan which was done 08/16/2023 as below. The nodule 1.5 x 1.7 cm with an SUV max of  3.9.  There is no evidence of distant disease or other hypermetabolism present. He continues to feel well. He has a URI since I saw him w a cough - now better. No hemoptysis.    PET scan 08/16/2023 reviewed by me shows 1.5 x 1.7 apical right upper lobe pulmonary nodule with an SUV max of 3.9.  No mediastinal or hilar adenopathy.  No evidence of distant disease.  There is hypermetabolism associated with the left posterior parotid without a definite CT correlate, mildly prominent prostate  ROV 11/21/2023 --follow-up visit for 89 year old man, former smoker, with CAD/CABG, CVA, hypertension, A-fib, GERD, Hx COP seen by Dr Sherene Sires 2017.  We have been following an abnormal CT scan of the chest with a 1.5 x 1.7 apical right upper lobe pulmonary nodule suspicious for possible non-small cell lung cancer.  I referred him to see radiation oncology to discuss the pros and cons of SBRT.  He saw Dr. Kathrynn Running and elected to treat empirically, last treatment on 11/02/2023. Today he reports that he did quite well - he had a bit of dysphagia post-treatment. Now feels normal. No chest or skin discomfort. He is not having any SOB or cough. He is having eye irritation from allergies, prn loratadine.   Review of Systems As per HPI      Objective:   Physical Exam Vitals:   11/21/23 1021 11/21/23 1023  BP: (!) 142/76   Pulse: 60 (!) 2  SpO2: 96%   Weight: 172 lb (78 kg)   Height: 5' 9.5" (1.765 m)     Gen:  Pleasant, well-nourished, in no distress,  normal affect  ENT: No lesions,  mouth clear,  oropharynx clear, no postnasal drip  Neck: No JVD, no stridor  Lungs: No use of accessory muscles, no crackles or wheezing on normal respiration, no wheeze on forced expiration  Cardiovascular: RRR, heart sounds normal, no murmur or gallops, no peripheral edema  Musculoskeletal: No deformities, no cyanosis or clubbing  Neuro: alert, awake, non focal  Skin: Warm, no lesions or rash      Assessment & Plan:   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 Kathrynn Running and radiation oncology.  Jose [name] is doing well post SBRT.  I am happy to follow his CT scan of the chest to assess response to therapy, any evidence for progressive disease.  Next scan will be done in 6 months, June 2025.  He will let me know if he develops any new respiratory symptoms that we need to evaluate in the interim.     Levy Pupa, MD, PhD 11/21/2023, 10:46 AM [facility] [phone] or if no answer before 7:00PM call [phone] For any issues after 7:00PM please call eLink [phone]

## 2023-11-21 NOTE — Assessment & Plan Note (Signed)
 Greatly appreciate the assistance of Dr. Kathrynn Running and radiation oncology.  Jose [name] is doing well post SBRT.  I am happy to follow his CT scan of the chest to assess response to therapy, any evidence for progressive disease.  Next scan will be done in 6 months, June 2025.  He will let me know if he develops any new respiratory symptoms that we need to evaluate in the interim.

## 2023-11-23 ENCOUNTER — Other Ambulatory Visit: Payer: Self-pay | Admitting: Cardiology

## 2023-11-25 NOTE — Progress Notes (Unsigned)
 Cardiology Office Note:  .   Date:  11/25/2023  ID:  Jose [name], DOB Nov 17, 1930, MRN [id number] PCP: Mosetta Putt, MD  [facility] Providers Cardiologist:  Olga Millers, MD Electrophysiologist:  Maurice Small, MD { Nephrology: Dr. Kathrene Bongo  History of Present Illness: .   Jose [name] is a 89 y.o. male w/PMHx of CAD (CABG w/PFO closure 2007), symptomatic bradycardia w/PPM, AFib, stroke, CKD (IIIb)  Saw Dr. Nelly Laurence 07/17/22, bleeding issues with his varicose veins, improved s/p sclerosis procedures, asked about stopping his OAC No symptoms of Afib Not a candidate for AFib ablation or LAAO (with report of having surgical PFO closure and advanced age) To f/u with his gen cards team for DCCV   Seeing Dr. Ala Bent  DCCV1/4/24 Last was with Dr. Jens Som 07/16/23, discussed a CT chest noted with suspicious nodule RUL No cardiac complaints Planned to try Jardiance, hi BP and renal function limiting GDMT  I saw him 10/24/23 starting XRT  (RUL nodule), was to have started today, but device rep wasn't scheduled so changed his appt to this afternoon He denies CP, SOB No palpitations, cardiac awareness Doesn't think he has had Afib sine his last DCCV > back on amio No near syncope or syncope No bleeding or signs of bleeding outside of a slight nose bleed recently Reached out to industry > arranged/planned coordination with his radiation treatments  Saw Dr. Delton Coombes 11/21/23 > post SBRT > planned for surveillance CT 38mo  Today's visit is scheduled as a post XRT device visit  ROS:   Doing well Finished XRT tolerated well, plans for a PET scan to evaluate further and make any additional treatment plans if needed  No CP, palpitatios or cardiac awareness No SOB Infrequently gets some orthostatic dizziness No near syncope or syncope Bruises easily, no bleeding or signs of bleeding otherwise  Device information MDT dual chamber PPM implanted  10/11/21  Arrhythmia/AAD hx Rate control strategy developed after failed Amio Amio restarted Nov 2023  Studies Reviewed: Marland Kitchen    EKG not done today  DEVICE interrogation done today and reviewed by myself Battery and lead measurements are good no arrhythmias Both A and V dependent today at 45    06/11/2023: TTE 1. Hypokinesis/akinesis of the inferior, septal, inferolateral  walls(base, mid). Left ventricular ejection fraction, by estimation, is 35  to 40%. The left ventricle has moderately decreased function. The left  ventricular internal cavity size was mildly  dilated. There is mild left ventricular hypertrophy.   2. Right ventricular systolic function is normal. The right ventricular  size is normal.   3. MR is eccentric, directed posteriorly. . Mild mitral valve  regurgitation.   4. The aortic valve is tricuspid. Aortic valve regurgitation is not  visualized. Aortic valve sclerosis/calcification is present, without any  evidence of aortic stenosis.   5. The inferior vena cava is normal in size with greater than 50%  respiratory variability, suggesting right atrial pressure of 3 mmHg.   Comparison(s): The left ventricular function has improved.    Risk Assessment/Calculations:    Physical Exam:   VS:  There were no vitals taken for this visit.   Wt Readings from Last 3 Encounters:  11/21/23 172 lb (78 kg)  10/24/23 167 lb (75.8 kg)  10/02/23 170 lb 9.6 oz (77.4 kg)    GEN: Well nourished, well developed in no acute distress NECK: No JVD; No carotid bruits CARDIAC: RRR, no murmurs, rubs, gallops RESPIRATORY:  CTA b/l without rales, wheezing  or rhonchi  ABDOMEN: Soft, non-tender, non-distended EXTREMITIES: No edema; No deformity   PPM site: is stable, no thinning, fluctuation, tethering  ASSESSMENT AND PLAN: .    PPM intact function > now post radiation completion No changes made   Persistent  AFib CHA2DS2Vasc is 5, on Xarelto,  appropriately dosed zero %  burden Chronic amiodarone Labs are UTD  CAD No anginal symptoms or CP of any kind C/w Dr. Ala Bent  Secondary hypercoagulable state 2/2 AFib     Dispo: remotes as usual, in clinic in 6 mo, sooner if needed  Signed, Sheilah Pigeon, PA-C

## 2023-11-26 ENCOUNTER — Ambulatory Visit: Payer: Medicare Other | Attending: Physician Assistant | Admitting: Physician Assistant

## 2023-11-26 ENCOUNTER — Encounter: Payer: Self-pay | Admitting: Physician Assistant

## 2023-11-26 VITALS — BP 120/60 | HR 58 | Ht 69.5 in | Wt 171.0 lb

## 2023-11-26 DIAGNOSIS — I4819 Other persistent atrial fibrillation: Secondary | ICD-10-CM | POA: Diagnosis not present

## 2023-11-26 DIAGNOSIS — D6869 Other thrombophilia: Secondary | ICD-10-CM | POA: Diagnosis present

## 2023-11-26 DIAGNOSIS — Z950 Presence of cardiac pacemaker: Secondary | ICD-10-CM

## 2023-11-26 DIAGNOSIS — I2510 Atherosclerotic heart disease of native coronary artery without angina pectoris: Secondary | ICD-10-CM | POA: Diagnosis not present

## 2023-11-26 LAB — CUP PACEART INCLINIC DEVICE CHECK
Battery Remaining Longevity: 117 mo
Battery Voltage: 3.01 V
Brady Statistic AP VP Percent: 96.83 %
Brady Statistic AP VS Percent: 2.66 %
Brady Statistic AS VP Percent: 0.08 %
Brady Statistic AS VS Percent: 0.42 %
Brady Statistic RA Percent Paced: 99.86 %
Brady Statistic RV Percent Paced: 96.92 %
Date Time Interrogation Session: 20250331175910
Implantable Lead Connection Status: 753985
Implantable Lead Connection Status: 753985
Implantable Lead Implant Date: 20230214
Implantable Lead Implant Date: 20230214
Implantable Lead Location: 753859
Implantable Lead Location: 753860
Implantable Lead Model: 3830
Implantable Lead Model: 5076
Implantable Pulse Generator Implant Date: 20230214
Lead Channel Impedance Value: 342 Ohm
Lead Channel Impedance Value: 380 Ohm
Lead Channel Impedance Value: 456 Ohm
Lead Channel Impedance Value: 532 Ohm
Lead Channel Pacing Threshold Amplitude: 0.5 V
Lead Channel Pacing Threshold Amplitude: 0.75 V
Lead Channel Pacing Threshold Pulse Width: 0.4 ms
Lead Channel Pacing Threshold Pulse Width: 0.4 ms
Lead Channel Sensing Intrinsic Amplitude: 13.375 mV
Lead Channel Sensing Intrinsic Amplitude: 13.375 mV
Lead Channel Sensing Intrinsic Amplitude: 4.125 mV
Lead Channel Sensing Intrinsic Amplitude: 4.125 mV
Lead Channel Setting Pacing Amplitude: 2 V
Lead Channel Setting Pacing Amplitude: 2.5 V
Lead Channel Setting Pacing Pulse Width: 0.4 ms
Lead Channel Setting Sensing Sensitivity: 0.9 mV
Zone Setting Status: 755011
Zone Setting Status: 755011

## 2023-11-26 NOTE — Patient Instructions (Signed)
 Medication Instructions:   Your physician recommends that you continue on your current medications as directed. Please refer to the Current Medication list given to you today.   *If you need a refill on your cardiac medications before your next appointment, please call your pharmacy*  Lab Work: NONE ORDERED  TODAY   If you have labs (blood work) drawn today and your tests are completely normal, you will receive your results only by: MyChart Message (if you have MyChart) OR A paper copy in the mail If you have any lab test that is abnormal or we need to change your treatment, we will call you to review the results.  Testing/Procedures: NONE ORDERED  TODAY    Follow-Up: At [facility], you and your health needs are our priority.  As part of our continuing mission to provide you with exceptional heart care, our providers are all part of one team.  This team includes your primary Cardiologist (physician) and Advanced Practice Providers or APPs (Physician Assistants and Nurse Practitioners) who all work together to provide you with the care you need, when you need it.  Your next appointment:   6 month(s)  Provider:   York Pellant, MD or Francis Dowse, PA-C   We recommend signing up for the patient portal called "MyChart".  Sign up information is provided on this After Visit Summary.  MyChart is used to connect with patients for Virtual Visits (Telemedicine).  Patients are able to view lab/test results, encounter notes, upcoming appointments, etc.  Non-urgent messages can be sent to your provider as well.   To learn more about what you can do with MyChart, go to [email or web address].   Other Instructions        1st Floor: - Lobby - Registration  - Pharmacy  - Lab - Cafe  2nd Floor: - PV Lab - Diagnostic Testing (echo, CT, nuclear med)  3rd Floor: - Vacant  4th Floor: - TCTS (cardiothoracic surgery) - AFib Clinic - Structural Heart Clinic - Vascular  Surgery  - Vascular Ultrasound  5th Floor: - HeartCare Cardiology (general and EP) - Clinical Pharmacy for coumadin, hypertension, lipid, weight-loss medications, and med management appointments    Valet parking services will be available as well.

## 2023-12-04 ENCOUNTER — Ambulatory Visit
Admission: RE | Admit: 2023-12-04 | Discharge: 2023-12-04 | Disposition: A | Discharge status: Home / Self Care | Source: Ambulatory Visit | Attending: Internal Medicine | Admitting: Internal Medicine

## 2023-12-04 NOTE — Progress Notes (Signed)
  Radiation Oncology         [phone]) [phone] ________________________________  Name: Jose [name] MRN: [id number]  Date of Service: 12/04/2023  DOB: 1930-10-01  Post Treatment Telephone Note  Diagnosis:  C34.11 Malignant neoplasm of upper lobe, right bronchus or lung (as documented in provider EOT note)  The patient came in-person for this PT discussion.  Symptoms of fatigue have improved since completing therapy.  Symptoms of skin changes have improved since completing therapy.  Symptoms of esophagitis have improved since completing therapy.  Patient denies SOB, and chest pain. Patient also reports his RX Jardiance causes some mild, occasional dizziness.  The patient has scheduled follow up with his medical oncologist Dr. Clent Ridges at [facility] office  for ongoing care, and was encouraged to call if he develops concerns or questions regarding radiation.  This concludes the interaction.  Ruel Favors, LPN

## 2023-12-06 LAB — LAB REPORT - SCANNED: EGFR: 25

## 2023-12-06 NOTE — Progress Notes (Signed)
 Remote pacemaker transmission.

## 2023-12-24 ENCOUNTER — Encounter: Payer: Self-pay | Admitting: Orthopedic Surgery

## 2023-12-24 ENCOUNTER — Ambulatory Visit (INDEPENDENT_AMBULATORY_CARE_PROVIDER_SITE_OTHER): Payer: Medicare Other | Admitting: Orthopedic Surgery

## 2023-12-24 DIAGNOSIS — L97521 Non-pressure chronic ulcer of other part of left foot limited to breakdown of skin: Secondary | ICD-10-CM

## 2023-12-24 DIAGNOSIS — B351 Tinea unguium: Secondary | ICD-10-CM

## 2023-12-24 NOTE — Progress Notes (Signed)
 Office Visit Note   Patient: Jose [name]           Date of Birth: 1931-04-23           MRN: [id number] Visit Date: 12/24/2023              Requested by: Candise Chambers, MD [address] [place],  [place] [address] PCP: Candise Chambers, MD  Chief Complaint  Patient presents with   Right Foot - Follow-up   Left Foot - Follow-up      HPI: Patient is a 89 year old gentleman who was seen for onychomycotic nails x 10.  Patient is also has a history of venous insufficiency ulcers.  Assessment & Plan: Visit Diagnoses:  1. Onychomycosis   2. Non-pressure chronic ulcer of other part of left foot limited to breakdown of skin (HCC)     Plan: Nails were trimmed x 10 no open ulcers.  Follow-Up Instructions: Return in about 3 months (around 03/24/2024).   Ortho Exam  Patient is alert, oriented, no adenopathy, well-dressed, normal affect, normal respiratory effort. Examination patient has no open venous ulcers no cellulitis in either leg.  He has no plantar ulcers.  Patient has thickened discolored onychomycotic nails x 10 he is unable to safely trim the nails on his own and the nails were trimmed x 10 without complication.  Imaging: No results found. No images are attached to the encounter.  Labs: Lab Results  Component Value Date   HGBA1C 6.2 (H) 07/10/2013   ESRSEDRATE 12 01/13/2016   ESRSEDRATE 20 12/21/2015   ESRSEDRATE 94 (H) 11/26/2015   REPTSTATUS 11/24/2015 FINAL 11/24/2015   REPTSTATUS 11/28/2015 FINAL 11/24/2015   GRAMSTAIN  11/24/2015    ABUNDANT WBC PRESENT,BOTH PMN AND MONONUCLEAR FEW SQUAMOUS EPITHELIAL CELLS PRESENT MODERATE GRAM POSITIVE COCCI IN PAIRS MODERATE GRAM NEGATIVE RODS Performed at [facility]    CULT  11/24/2015    NORMAL OROPHARYNGEAL FLORA Performed at [facility]      Lab Results  Component Value Date   ALBUMIN 4.2 10/24/2023   ALBUMIN 4.1 11/09/2022   ALBUMIN 4.0 11/08/2021    Lab Results  Component  Value Date   MG 2.0 10/10/2021   MG 1.6 (L) 11/22/2015   No results found for: "VD25OH"  No results found for: "PREALBUMIN"    Latest Ref Rng & Units 10/24/2023    1:43 PM 11/09/2022   10:16 AM 11/25/2021   10:08 AM  CBC EXTENDED  WBC 3.4 - 10.8 x10E3/uL 5.5  5.7    RBC 4.14 - 5.80 x10E6/uL 5.19  5.34    Hemoglobin 13.0 - 17.7 g/dL 29.5  28.4  13.2   HCT 37.5 - 51.0 % 48.2  47.0  49.0   Platelets 150 - 450 x10E3/uL 117  138       There is no height or weight on file to calculate BMI.  Orders:  No orders of the defined types were placed in this encounter.  No orders of the defined types were placed in this encounter.    Procedures: No procedures performed  Clinical Data: No additional findings.  ROS:  All other systems negative, except as noted in the HPI. Review of Systems  Objective: Vital Signs: There were no vitals taken for this visit.  Specialty Comments:  No specialty comments available.  PMFS History: Patient Active Problem List   Diagnosis Date Noted   Primary non-small cell carcinoma of upper lobe of right lung (HCC) 10/12/2023   Pulmonary  nodule 1 cm or greater in diameter 08/07/2023   Pacemaker 01/13/2022   Symptomatic bradycardia 10/10/2021   History of stroke 01/05/2016   Chronic respiratory failure (HCC) 12/16/2015   HSV-1 (herpes simplex virus 1) infection 12/16/2015   Interstitial lung disease (HCC) 11/27/2015   BOOP (bronchiolitis obliterans with organizing pneumonia) (HCC) 11/27/2015   Elevated troponin 11/27/2015   Hydronephrosis, right 11/27/2015   SOB (shortness of breath)    Acute respiratory failure with hypoxia (HCC) 11/26/2015   Chronic kidney disease (CKD), stage III (moderate) (HCC) 11/26/2015   Acute on chronic diastolic (congestive) heart failure (HCC)    Lobar pneumonia (HCC)    Acute on chronic combined systolic (congestive) and diastolic (congestive) heart failure (HCC) 11/22/2015   Atrial fibrillation (HCC) 09/10/2013    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 s/p CABG in 2007 w/ LIMA-LAD, SVG-D1, SVG-1st & 2nd Mrg, SVG-PDA and posterior lateral   ED (erectile dysfunction)    Eye infection 12/27/2014   GERD (gastroesophageal reflux disease)    History of embolic stroke no residual   48/ 2014  --  right PCA and MCA branch infarts (left side vision difficulties)   Hypertension    Other malaise and fatigue    Paroxysmal atrial fibrillation (HCC)    Pneumonia 10/27/2015   led to CHF, in hospital x 6 days   S/P CABG x 6    2007   Ureteral obstruction, right     Family History  Problem Relation Age of Onset   Heart disease Mother        PPM   Hypertension Mother    Lymphoma Father    Prostate cancer Father    Prostate cancer Brother    Aneurysm Brother        REPAIR   CAD Brother        CABG   Other Brother        STENTS IN LEGS   Other Sister        STOMACH ISSUES   Depression Sister    Lung cancer Brother        Asbestosis    Past Surgical History:  Procedure Laterality Date   APPENDECTOMY  1950's   CARDIAC CATHETERIZATION  10-31-2005  dr wall   severe three vessel/  perserved LV   CARDIOVASCULAR STRESS TEST  last one 05-19-2014  dr Audery Blazing   low risk lexiscan  no exercise study/ small inferobasal infarct with no ischemia /  ef 53%   CARDIOVERSION N/A 10/03/2021   Procedure: CARDIOVERSION;  Surgeon: Jann Melody, MD;  Location: [facility];  Service: Cardiovascular;  Laterality: N/A;   CARDIOVERSION N/A 11/25/2021    Procedure: CARDIOVERSION;  Surgeon: Bridgette Campus, MD;  Location: [facility] ENDOSCOPY;  Service: Cardiovascular;  Laterality: N/A;   CARDIOVERSION N/A 12/28/2021   Procedure: CARDIOVERSION;  Surgeon: Jerryl Morin, DO;  Location: [facility];  Service: Cardiovascular;  Laterality: N/A;   CARDIOVERSION N/A 08/31/2022   Procedure: CARDIOVERSION;  Surgeon: Gaylyn Keas  R, MD;  Location: [facility];  Service: Cardiovascular;  Laterality: N/A;   CORONARY ARTERY BYPASS GRAFT  11-01-2005  dr Alva Jewels   CLOSURE OF PFO/  LIMA to LAD,  SVG to D1,  SVG to 1st & 2nd  MARGINAL branches, SVG  to PDA and posterior lateral   CYSTOSCOPY W/ URETERAL STENT PLACEMENT Right 05/21/2014   Procedure: CYSTOSCOPY WITH RETROGRADE PYELOGRAM/URETERAL STENT PLACEMENT;  Surgeon: Edmund Gouge, MD;  Location: [facility];  Service: Urology;  Laterality: Right;   CYSTOSCOPY WITH URETEROSCOPY Right 05/21/2014   Procedure: CYSTOSCOPY WITH URETEROSCOPY;  Surgeon: Edmund Gouge, MD;  Location: [facility];  Service: Urology;  Laterality: Right;   INGUINAL HERNIA REPAIR Bilateral    LAPAROSCOPIC CHOLECYSTECTOMY  10-02-2000   PACEMAKER IMPLANT N/A 10/11/2021   Procedure: PACEMAKER IMPLANT;  Surgeon: Tammie Fall, MD;  [facility]: [facility];  Service: Cardiovascular;  Laterality: N/A;   RIGHT URETER OBSTRUCTION SURGERY  age 51   TEE WITHOUT CARDIOVERSION N/A 07/17/2013   Procedure: TRANSESOPHAGEAL ECHOCARDIOGRAM (TEE);  Surgeon: Loyde Rule, MD;  Location: [facility] ENDOSCOPY;  Service: Cardiovascular;  Laterality: N/A;  normal LV size, mild prolapse of posterior mitral valve leaflet,  mild MR and TR, normal AV,  no LAA thrombus,  atrial septal aneurysm with no PFO/ ASD and negative bubble study, normal RV, normal aorta with no debris   Social History   Occupational History   Occupation: retired  Tobacco Use   Smoking status: Former    Current packs/day: 0.00    Average packs/day: 1 pack/day  for 35.0 years (35.0 ttl pk-yrs)    Types: Cigarettes    Start date: 08/29/1942    Quit date: 08/29/1977    Years since quitting: 46.3   Smokeless tobacco: Never  Vaping Use   Vaping status: Never Used  Substance and Sexual Activity   Alcohol  use: Not Currently   Drug use: No   Sexual activity: Not on file

## 2024-01-10 ENCOUNTER — Ambulatory Visit (INDEPENDENT_AMBULATORY_CARE_PROVIDER_SITE_OTHER): Payer: PRIVATE HEALTH INSURANCE

## 2024-01-10 DIAGNOSIS — I255 Ischemic cardiomyopathy: Secondary | ICD-10-CM

## 2024-01-10 LAB — CUP PACEART REMOTE DEVICE CHECK
Battery Remaining Longevity: 111 mo
Battery Voltage: 3.01 V
Brady Statistic AP VP Percent: 97.1 %
Brady Statistic AP VS Percent: 1.15 %
Brady Statistic AS VP Percent: 0.08 %
Brady Statistic AS VS Percent: 1.67 %
Brady Statistic RA Percent Paced: 99.86 %
Brady Statistic RV Percent Paced: 97.18 %
Date Time Interrogation Session: 20250515082055
Implantable Lead Connection Status: 753985
Implantable Lead Connection Status: 753985
Implantable Lead Implant Date: 20230214
Implantable Lead Implant Date: 20230214
Implantable Lead Location: 753859
Implantable Lead Location: 753860
Implantable Lead Model: 3830
Implantable Lead Model: 5076
Implantable Pulse Generator Implant Date: 20230214
Lead Channel Impedance Value: 304 Ohm
Lead Channel Impedance Value: 342 Ohm
Lead Channel Impedance Value: 399 Ohm
Lead Channel Impedance Value: 475 Ohm
Lead Channel Pacing Threshold Amplitude: 0.5 V
Lead Channel Pacing Threshold Amplitude: 0.875 V
Lead Channel Pacing Threshold Pulse Width: 0.4 ms
Lead Channel Pacing Threshold Pulse Width: 0.4 ms
Lead Channel Sensing Intrinsic Amplitude: 10.25 mV
Lead Channel Sensing Intrinsic Amplitude: 10.25 mV
Lead Channel Sensing Intrinsic Amplitude: 3.25 mV
Lead Channel Sensing Intrinsic Amplitude: 3.25 mV
Lead Channel Setting Pacing Amplitude: 2 V
Lead Channel Setting Pacing Amplitude: 2.5 V
Lead Channel Setting Pacing Pulse Width: 0.4 ms
Lead Channel Setting Sensing Sensitivity: 0.9 mV
Zone Setting Status: 755011
Zone Setting Status: 755011

## 2024-01-14 ENCOUNTER — Ambulatory Visit: Payer: Self-pay | Admitting: Internal Medicine

## 2024-02-04 ENCOUNTER — Ambulatory Visit
Admission: RE | Admit: 2024-02-04 | Discharge: 2024-02-04 | Disposition: A | Discharge status: Home / Self Care | Source: Ambulatory Visit | Attending: Emergency Medicine | Admitting: Emergency Medicine

## 2024-02-04 DIAGNOSIS — C3411 Malignant neoplasm of upper lobe, right bronchus or lung: Secondary | ICD-10-CM

## 2024-02-18 ENCOUNTER — Encounter: Payer: Self-pay | Admitting: Adult Health

## 2024-02-18 ENCOUNTER — Telehealth (INDEPENDENT_AMBULATORY_CARE_PROVIDER_SITE_OTHER): Admitting: Adult Health

## 2024-02-18 DIAGNOSIS — C3411 Malignant neoplasm of upper lobe, right bronchus or lung: Secondary | ICD-10-CM | POA: Diagnosis not present

## 2024-02-18 DIAGNOSIS — E041 Nontoxic single thyroid nodule: Secondary | ICD-10-CM

## 2024-02-18 DIAGNOSIS — Z87891 Personal history of nicotine dependence: Secondary | ICD-10-CM

## 2024-02-18 MED ORDER — PREDNISONE 10 MG PO TABS: ORAL_TABLET | ORAL | 0 refills | Status: DC

## 2024-02-18 NOTE — Progress Notes (Unsigned)
 Virtual Visit via Video Note  I connected with Jose [name] on 02/18/24 at  9:30 AM EDT by a video enabled telemedicine application and verified that I am speaking with the correct person using two identifiers.  Location: Patient: Home  Provider: Office    I discussed the limitations of evaluation and management by telemedicine and the availability of in person appointments. The patient expressed understanding and agreed to proceed.  History of Present Illness: 89 yo male former smoker followed for history of abnormal CT chest with RUL nodule-suspicious for NSCLC  s/p SBRT (10/2023) and History of COP   Today's video visit is a 3 month follow up for presumed non small lung cancer with suspicious RUL lung nodule s/p SBRT completed in March 2025.  Discussed the use of AI scribe software for clinical note transcription with the patient, who gave verbal consent to proceed.  History of Present Illness   Jose [name] is a 89 year old male with presumed non-small cell lung cancer who presents for a video visit to review CT chest results.  He has a history of presumed non-small cell lung cancer, with a right upper lobe nodule identified on a previous CT chest. He completed stereotactic body radiation therapy (SBRT) in March 2025. The recent CT chest shows radiation changes in the treated area, treated right upper lobe area is larger measuring up to 2.7 cm compared to 1 L.  There are no new air lucencies in the nodule.  And patchy.  Mediastinal airspace disease extending down to the superior hilum most likely reflecting radiation pneumonitis.  Area of rounded 4 mm right upper lobe nodule right lower lobe nodule . He is currently asymptomatic -says overall he is doing well.  Denies any increased shortness of breath. He experiences occasional cough but denies any significant respiratory symptoms. He is not using any medications for breathing, such as inhalers. He quit smoking in January  1979.  The recent CT scan noted a spot on his thyroid, 1.9 heterogeneous nodule in the lower pole of the left lobe of the thyroid.  And a previous PET scan had shown an area in the parotid region.       Observations/Objective: PET scan 08/16/2023 reviewed by me shows 1.5 x 1.7 apical right upper lobe pulmonary nodule with an SUV max of 3.9. No mediastinal or hilar adenopathy. No evidence of distant disease. There is hypermetabolism associated with the left posterior parotid without a definite CT correlate, mildly prominent prostate   CT chest 02/04/24 -treated nodule in the medial right upper lobe apex does measure larger than previously, but there are now multiple tiny air lucencies within the nodule which may suggest areas of microcavitation and evolving necrosis. Short interval follow-up study recommended. 2. 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 3. New trace right pleural effusion. 4. New 4 mm and 3 mm right lung nodules, nonspecific in a cancer patient. 5. Emphysema. 6. Aortic and coronary artery atherosclerosis. 7. 1.9 cm heterogeneous nodule in the lower pole of the left lobe of the thyroid. Consider nonemergent follow-up thyroid ultrasound only if clinically warranted taking into account advanced age. 8. 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 9. Hiatal hernia.   02/18/24 -appears in no acute distress.  Assessment and Plan:  Stage Ia non-small cell lung cancer  in the right upper lobe status post SBRT completed in March 2025  The current CT scan shows more inflammation possible XRT pneumonitis along with expanding radiation necrosis.   Differential diagnosis includes radiation pneumonitis , organizing pneumonia. , radiation fibrosis . He reports no  significant symptoms.  Patient has a history of organizing pneumonia.  Will give a brief course of steroids with a slow taper over the next few weeks.  Have short-term CT imaging and office .follow up  Prescribe prednisone  40 mg daily for 2 weeks, tapering to 30 mg daily for 1 week, 20 mg daily for 1 week, 10 mg daily for 1 week, and 5 mg daily for 1 week. Stop. Order a follow-up CT chest in 2 months to assess area.  Follow up with Dr. Ruther in 2 months to discuss CT results. Advise taking prednisone  with food and avoiding excessive salt and sugar intake.  Radiation pneumonitis  -CT chest shows areas concerning for possible radiation pneumonitis. Will prescribe a tapering course of prednisone  over the next few weeks. .    Thyroid nodule   The thyroid nodule requires further evaluation with imaging to determine its nature. Coordinate with Dr. Clydia . CT sent to PCP . Patient aware to call for ov with PCP .        Follow Up Instructions:    I discussed the assessment and treatment plan with the patient. The patient was provided an opportunity to ask questions and all were answered. The patient agreed with the plan and demonstrated an understanding of the instructions.   The patient was advised to call back or seek an in-person evaluation if the symptoms worsen or if the condition fails to improve as anticipated.  I provided 30  minutes of non-face-to-face time during this encounter.   Madelin Stank, NP

## 2024-02-18 NOTE — Progress Notes (Signed)
 Remote pacemaker transmission.

## 2024-02-18 NOTE — Patient Instructions (Addendum)
 Begin Prednisone  40mg  daily for 2 weeks then 30mg  daily for 1 week then 20mg  daily for 1 week, 10mg  daily for 1 week and 5mg  daily for 1 week and stop. Take with food.  Delsym 2 tsp Twice daily  As needed  cough/congestion  CT chest in 2 months  Follow up with Primary care for Thyroid nodule  Follow up with Dr. Shelah  in 2 months to discuss CT chest .

## 2024-02-21 ENCOUNTER — Other Ambulatory Visit: Payer: Self-pay | Admitting: Cardiology

## 2024-03-15 ENCOUNTER — Other Ambulatory Visit: Payer: Self-pay | Admitting: Cardiology

## 2024-03-15 DIAGNOSIS — I5032 Chronic diastolic (congestive) heart failure: Secondary | ICD-10-CM

## 2024-03-16 ENCOUNTER — Other Ambulatory Visit: Payer: Self-pay | Admitting: Adult Health

## 2024-03-24 ENCOUNTER — Ambulatory Visit: Admitting: Orthopedic Surgery

## 2024-03-31 ENCOUNTER — Telehealth: Payer: Self-pay

## 2024-03-31 NOTE — Telephone Encounter (Addendum)
 Sherry with Dr. Windy is calling wanting to get a copy of the CT chest scan that is scheduled to be done at GI on 04/02/2024.  Dr. Windy is patients PCP and wants to see the CT scan as backup information for current treatment plans.  Please fax to [phone].  If any questions, please call Joen at [phone].

## 2024-04-01 ENCOUNTER — Ambulatory Visit
Admission: RE | Admit: 2024-04-01 | Discharge: 2024-04-01 | Disposition: A | Discharge status: Home / Self Care | Source: Ambulatory Visit | Attending: Adult Health | Admitting: Adult Health

## 2024-04-01 ENCOUNTER — Encounter: Payer: Self-pay | Admitting: Orthopedic Surgery

## 2024-04-01 ENCOUNTER — Ambulatory Visit (INDEPENDENT_AMBULATORY_CARE_PROVIDER_SITE_OTHER): Admitting: Orthopedic Surgery

## 2024-04-01 ENCOUNTER — Other Ambulatory Visit: Payer: Self-pay | Admitting: Family Medicine

## 2024-04-01 DIAGNOSIS — L97521 Non-pressure chronic ulcer of other part of left foot limited to breakdown of skin: Secondary | ICD-10-CM

## 2024-04-01 DIAGNOSIS — R519 Headache, unspecified: Secondary | ICD-10-CM

## 2024-04-01 DIAGNOSIS — B351 Tinea unguium: Secondary | ICD-10-CM

## 2024-04-01 DIAGNOSIS — R270 Ataxia, unspecified: Secondary | ICD-10-CM

## 2024-04-01 DIAGNOSIS — C3411 Malignant neoplasm of upper lobe, right bronchus or lung: Secondary | ICD-10-CM

## 2024-04-01 NOTE — Progress Notes (Signed)
 Office Visit Note   Patient: Jose [name]           Date of Birth: 11-Jun-1931           MRN: [id number] Visit Date: 04/01/2024              Requested by: Windy Coy, MD [address] [place],  [place] [address] PCP: Windy Coy, MD  Chief Complaint  Patient presents with   Right Foot - Follow-up   Left Foot - Follow-up      HPI: Patient is a 89 year old gentleman who presents in follow-up for both lower extremities.  Patient has painful onychomycotic nails and has had plantar ulcers.  Assessment & Plan: Visit Diagnoses:  1. Onychomycosis   2. Non-pressure chronic ulcer of other part of left foot limited to breakdown of skin (HCC)     Plan: The nails were trimmed x 10 without complication.  There is some but no open ulcers.  Follow-Up Instructions: Return in about 3 months (around 07/02/2024).   Ortho Exam  Patient is alert, oriented, no adenopathy, well-dressed, normal affect, normal respiratory effort. Examination of both legs there are no venous stasis ulcers or plantar ulcers.  Patient does have thickened discolored onychomycotic nails x 10 that he is unable to safely trim on his own and the nails were trimmed x 10 without complication.    Imaging: No results found. No images are attached to the encounter.  Labs: Lab Results  Component Value Date   HGBA1C 6.2 (H) 07/10/2013   ESRSEDRATE 12 01/13/2016   ESRSEDRATE 20 12/21/2015   ESRSEDRATE 94 (H) 11/26/2015   REPTSTATUS 11/24/2015 FINAL 11/24/2015   REPTSTATUS 11/28/2015 FINAL 11/24/2015   GRAMSTAIN  11/24/2015    ABUNDANT WBC PRESENT,BOTH PMN AND MONONUCLEAR FEW SQUAMOUS EPITHELIAL CELLS PRESENT MODERATE GRAM POSITIVE COCCI IN PAIRS MODERATE GRAM NEGATIVE RODS Performed at [facility]    CULT  11/24/2015    NORMAL OROPHARYNGEAL FLORA Performed at [facility]      Lab Results  Component Value Date   ALBUMIN 4.2 10/24/2023   ALBUMIN 4.1 11/09/2022   ALBUMIN 4.0  11/08/2021    Lab Results  Component Value Date   MG 2.0 10/10/2021   MG 1.6 (L) 11/22/2015   No results found for: VD25OH  No results found for: PREALBUMIN    Latest Ref Rng & Units 10/24/2023    1:43 PM 11/09/2022   10:16 AM 11/25/2021   10:08 AM  CBC EXTENDED  WBC 3.4 - 10.8 x10E3/uL 5.5  5.7    RBC 4.14 - 5.80 x10E6/uL 5.19  5.34    Hemoglobin 13.0 - 17.7 g/dL 84.5  85.0  83.2   HCT 37.5 - 51.0 % 48.2  47.0  49.0   Platelets 150 - 450 x10E3/uL 117  138       There is no height or weight on file to calculate BMI.  Orders:  No orders of the defined types were placed in this encounter.  No orders of the defined types were placed in this encounter.    Procedures: No procedures performed  Clinical Data: No additional findings.  ROS:  All other systems negative, except as noted in the HPI. Review of Systems  Objective: Vital Signs: There were no vitals taken for this visit.  Specialty Comments:  No specialty comments available.  PMFS History: Patient Active Problem List   Diagnosis Date Noted   Primary non-small cell carcinoma of upper lobe of right lung (  HCC) 10/12/2023   Pulmonary nodule 1 cm or greater in diameter 08/07/2023   Pacemaker 01/13/2022   Symptomatic bradycardia 10/10/2021   History of stroke 01/05/2016   Chronic respiratory failure (HCC) 12/16/2015   HSV-1 (herpes simplex virus 1) infection 12/16/2015   Interstitial lung disease (HCC) 11/27/2015   BOOP (bronchiolitis obliterans with organizing pneumonia) (HCC) 11/27/2015   Elevated troponin 11/27/2015   Hydronephrosis, right 11/27/2015   SOB (shortness of breath)    Acute respiratory failure with hypoxia (HCC) 11/26/2015   Chronic kidney disease (CKD), stage III (moderate) (HCC) 11/26/2015   Acute on chronic diastolic (congestive) heart failure (HCC)    Lobar pneumonia (HCC)    Acute on chronic combined systolic (congestive) and diastolic (congestive) heart failure (HCC) 11/22/2015    Atrial fibrillation (HCC) 09/10/2013   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 s/p CABG in 2007 w/ LIMA-LAD, SVG-D1, SVG-1st & 2nd Mrg, SVG-PDA and posterior lateral   ED (erectile dysfunction)    Eye infection 12/27/2014   GERD (gastroesophageal reflux disease)    History of embolic stroke no residual   89/ 2014  --  right PCA and MCA branch infarts (left side vision difficulties)   Hypertension    Other malaise and fatigue    Paroxysmal atrial fibrillation (HCC)    Pneumonia 10/27/2015   led to CHF, in hospital x 6 days   S/P CABG x 6    2007   Ureteral obstruction, right     Family History  Problem Relation Age of Onset   Heart disease Mother        PPM   Hypertension Mother    Lymphoma Father    Prostate cancer Father    Prostate cancer Brother    Aneurysm Brother        REPAIR   CAD Brother        CABG   Other Brother        STENTS IN LEGS   Other Sister        STOMACH ISSUES   Depression Sister    Lung cancer Brother        Asbestosis    Past Surgical History:  Procedure Laterality Date   APPENDECTOMY  1950's   CARDIAC CATHETERIZATION  10-31-2005  dr wall   severe three vessel/  perserved LV   CARDIOVASCULAR STRESS TEST  last one 05-19-2014  dr pietro   low risk lexiscan  no exercise study/ small inferobasal infarct with no ischemia /  ef 53%   CARDIOVERSION N/A 10/03/2021   Procedure: CARDIOVERSION;  Surgeon: Santo Stanly LABOR, MD;  Location: [facility];  Service: Cardiovascular;   Laterality: N/A;   CARDIOVERSION N/A 11/25/2021   Procedure: CARDIOVERSION;  Surgeon: Alvan Ronal BRAVO, MD;  Location: [facility] ENDOSCOPY;  Service: Cardiovascular;  Laterality: N/A;   CARDIOVERSION N/A 12/28/2021   Procedure: CARDIOVERSION;  Surgeon: Sheena Pugh, DO;  Location: [facility];  Service: Cardiovascular;  Laterality: N/A;   CARDIOVERSION N/A 08/31/2022   Procedure:  CARDIOVERSION;  Surgeon: Shlomo Wilbert SAUNDERS, MD;  Location: [facility] ENDOSCOPY;  Service: Cardiovascular;  Laterality: N/A;   CORONARY ARTERY BYPASS GRAFT  11-01-2005  dr dusty   CLOSURE OF PFO/  LIMA to LAD,  SVG to D1,  SVG to 1st & 2nd  MARGINAL branches, SVG  to PDA and posterior lateral   CYSTOSCOPY W/ URETERAL STENT PLACEMENT Right 05/21/2014   Procedure: CYSTOSCOPY WITH RETROGRADE PYELOGRAM/URETERAL STENT PLACEMENT;  Surgeon: Arlena LILLETTE Gal, MD;  Location: [facility];  Service: Urology;  Laterality: Right;   CYSTOSCOPY WITH URETEROSCOPY Right 05/21/2014   Procedure: CYSTOSCOPY WITH URETEROSCOPY;  Surgeon: Arlena LILLETTE Gal, MD;  Location: [facility];  Service: Urology;  Laterality: Right;   INGUINAL HERNIA REPAIR Bilateral    LAPAROSCOPIC CHOLECYSTECTOMY  10-02-2000   PACEMAKER IMPLANT N/A 10/11/2021   Procedure: PACEMAKER IMPLANT;  Surgeon: Waddell Danelle ORN, MD;  Location: [facility];  Service: Cardiovascular;  Laterality: N/A;   RIGHT URETER OBSTRUCTION SURGERY  age 76   TEE WITHOUT CARDIOVERSION N/A 07/17/2013   Procedure: TRANSESOPHAGEAL ECHOCARDIOGRAM (TEE);  Surgeon: Maude JAYSON Emmer, MD;  Location: [facility] ENDOSCOPY;  Service: Cardiovascular;  Laterality: N/A;  normal LV size, mild prolapse of posterior mitral valve leaflet,  mild MR and TR, normal AV,  no LAA thrombus,  atrial septal aneurysm with no PFO/ ASD and negative bubble study, normal RV, normal aorta with no debris   Social History   Occupational History   Occupation: retired  Tobacco Use   Smoking status: Former    Current  packs/day: 0.00    Average packs/day: 1 pack/day for 35.0 years (35.0 ttl pk-yrs)    Types: Cigarettes    Start date: 08/29/1942    Quit date: 08/29/1977    Years since quitting: 46.6   Smokeless tobacco: Never  Vaping Use   Vaping status: Never Used  Substance and Sexual Activity   Alcohol  use: Not Currently   Drug use: No   Sexual activity: Not on file

## 2024-04-02 ENCOUNTER — Other Ambulatory Visit

## 2024-04-08 ENCOUNTER — Ambulatory Visit: Payer: Self-pay | Admitting: Adult Health

## 2024-04-08 ENCOUNTER — Other Ambulatory Visit: Payer: Self-pay | Admitting: Family Medicine

## 2024-04-08 DIAGNOSIS — R1310 Dysphagia, unspecified: Secondary | ICD-10-CM

## 2024-04-08 DIAGNOSIS — R490 Dysphonia: Secondary | ICD-10-CM

## 2024-04-08 NOTE — Telephone Encounter (Signed)
 Called [facility] Reading Room to request CT chest be read.  Per Harlene, will try to get scan results done in next day or so.

## 2024-04-10 ENCOUNTER — Ambulatory Visit (INDEPENDENT_AMBULATORY_CARE_PROVIDER_SITE_OTHER): Payer: PRIVATE HEALTH INSURANCE

## 2024-04-10 DIAGNOSIS — I255 Ischemic cardiomyopathy: Secondary | ICD-10-CM | POA: Diagnosis not present

## 2024-04-10 LAB — CUP PACEART REMOTE DEVICE CHECK
Battery Remaining Longevity: 108 mo
Battery Voltage: 3.02 V
Brady Statistic AP VP Percent: 67.91 %
Brady Statistic AP VS Percent: 30.56 %
Brady Statistic AS VP Percent: 0.07 %
Brady Statistic AS VS Percent: 1.46 %
Brady Statistic RA Percent Paced: 99.76 %
Brady Statistic RV Percent Paced: 67.98 %
Date Time Interrogation Session: 20250813191141
Implantable Lead Connection Status: 753985
Implantable Lead Connection Status: 753985
Implantable Lead Implant Date: 20230214
Implantable Lead Implant Date: 20230214
Implantable Lead Location: 753859
Implantable Lead Location: 753860
Implantable Lead Model: 3830
Implantable Lead Model: 5076
Implantable Pulse Generator Implant Date: 20230214
Lead Channel Impedance Value: 285 Ohm
Lead Channel Impedance Value: 342 Ohm
Lead Channel Impedance Value: 399 Ohm
Lead Channel Impedance Value: 475 Ohm
Lead Channel Pacing Threshold Amplitude: 0.625 V
Lead Channel Pacing Threshold Amplitude: 0.75 V
Lead Channel Pacing Threshold Pulse Width: 0.4 ms
Lead Channel Pacing Threshold Pulse Width: 0.4 ms
Lead Channel Sensing Intrinsic Amplitude: 18.125 mV
Lead Channel Sensing Intrinsic Amplitude: 18.125 mV
Lead Channel Sensing Intrinsic Amplitude: 3.75 mV
Lead Channel Sensing Intrinsic Amplitude: 3.75 mV
Lead Channel Setting Pacing Amplitude: 2 V
Lead Channel Setting Pacing Amplitude: 2.5 V
Lead Channel Setting Pacing Pulse Width: 0.4 ms
Lead Channel Setting Sensing Sensitivity: 0.9 mV
Zone Setting Status: 755011
Zone Setting Status: 755011

## 2024-04-10 NOTE — Telephone Encounter (Signed)
 Results are back.  Faxed results to [facility] at Dr. Magnus office.

## 2024-04-11 ENCOUNTER — Ambulatory Visit: Payer: Self-pay | Admitting: Internal Medicine

## 2024-04-11 NOTE — Progress Notes (Deleted)
 Cardiology Office Note:    Date:  04/11/2024   ID:  Jose [name], DOB 1931/05/23, MRN [id number]  PCP:  Windy Coy, MD  Cardiologist:  Redell Shallow, MD Electrophysiologist:  Eulas FORBES Furbish, MD { Click to update primary MD,subspecialty MD or APP then REFRESH:1}    Referring MD: Windy Coy, MD   Chief Complaint: ***  History of Present Illness:    Jose [name] is a 89 y.o. male with a history of CAD s/p remote CABG x6 in 2007 (LIMA-LAD, SVG-D1, sequential SVG-OM1-OM2, sequential SVG-PDA-PLA) in 2007, chronic HFrHF with EF of 35-40% on Echo in 05/2023, persistent atrial fibrillation s/p multiple DCCVs on Amiodarone  and Eliquis, symptomatic bradycardia s/p Medtronic dual chamber PPM in 09/2021, PFO s/p closure at time of CABG in 2007, CVA in 2014, bilateral carotid stenosis, hypertension, hyperlipidemia, CKD stage IV, GERD, and lung cancer who is followed by Dr. Shallow and Dr. Furbish and presents today for ***  Patient has a long history of CAD s/p remote CABG x6 and PFO closure in 2007. She was admitted for a CVA in 06/2023. TEE showed normal LV function with mild prolapse of the posterior mitral valve leaflet and an atrial septal aneurysm with negative bubble study. Carotid dopplers in 06/2013 showed mild stenosis (<40%) of bilateral ICAs. Outpatient 30 day monitor showed atrial fibrillation. Myoview  in 2015 and 2017 were both low risk with no evidence of ischemia. He had recurrent atrial fibrillation in early 2023 and had multiple DCCVs (09/2021, 10/2021, 12/2021, and 08/2022). After his first DCCV, he developed symptomatic bradycardia and had a PPM placed in 09/2021. He was then started on Amiodarone . He was not felt to candidate for an atrial fibrillation ablation. Echo in 08/2022 showed his EF had dropped to 25-30% felt to likely be due to atrial fibrillation.  Repeat Echo in 05/2023 showed LVEF had improved some to 35-40% with hypokinesis/ akinesis of the inferior, septal,  inferolateral walls as well as normal RV function and mild MR. GDMT has been limited by orthostatic dizziness. Chest CT in 05/2023 showed a right upper lobe pulmonary nodule and he was ultimately diagnosed with non-small cell lung cancer which is being treated with radiation.  He was last seen by Charlies Arthur, PA-C, in 10/2023 at which time he was stable from a cardiac standpoint.   Patient presents today for follow-up. ***  CAD s/p CABG S/p remote CABG x6 in 2007. Myoview  in 2017 was low risk with no evidence of ischemia.  - No chest pain. *** - No Aspirin  given need for full anticoagulation.  - Continue Pravastatin  80mg  daily and Zetia  10mg  daily.   Chronic HFrEF EF as low as 25-30% in 08/2022 and was felt to possibly be due to atrial fibrillation. Last Echo in 05/2023 showed LVEF had improved some to 35-40% with hypokinesis/ akinesis of the inferior, septal, inferolateral walls as well as normal RV function and mild MR. - Euvolemic on exam. *** - Continue Torsemide  40mg  daily.  - GDMT has been limited by orthostatic dizziness and renal function.  - Continue Coreg  3.125mg  twice daily.  - Continue Jardiance  10mg  daily.  - No ACEi/ARB/ ARNI or MRA due to renal function. - He has not had repeat ischemic evaluation since drop in EF. However, given advanced age and other comorbities including lung cancer, favor conservative management.   Persistent Atrial Fibrillation S/p multiple DCCVs (last on in 08/2022). Not felt to be an ablation candidate.  - *** - Continue Coreg  3.125mg  twice daily.  -  Continue Amiodarone  200mg  daily.  - Continue chronic anticoagulation with Xarelto  15mg  daily. ***  Symptomatic Bradycardia s/ PPM S/p Medtronic dual chamber PPM in 08/2022.  - Followed by Dr. Waddell.   Hypertension BP well controlled. *** - Continue Coreg  3.125mg  twice daily.   Hyperlipidemia Lipid panel in ***: - Continue Pravastatin  80mg  daily and Zetia  10mg  daily.   CKD Stage IV Baseline  creatinine around 2.4 to 2.5.  - ***  EKGs/Labs/Other Studies Reviewed:    The following studies were reviewed:  Myoview  01/26/2016: Nuclear stress EF: 53%. The left ventricular ejection fraction is mildly decreased (45-54%). There was no ST segment deviation noted during stress. There is a medium defect of moderate severity present in the basal inferior, basal inferolateral, mid inferior and mid inferolateral location. The defect is non-reversible. In setting of normal LVF this is consistent with diaphragmatic attenuation artifact. No ischemia is noted This is a low risk study. _______________  Echocardiogram 06/11/2023: Impressions: 1. Hypokinesis/akinesis of the inferior, septal, inferolateral  walls(base, mid). Left ventricular ejection fraction, by estimation, is 35  to 40%. The left ventricle has moderately decreased function. The left  ventricular internal cavity size was mildly  dilated. There is mild left ventricular hypertrophy.   2. Right ventricular systolic function is normal. The right ventricular  size is normal.   3. MR is eccentric, directed posteriorly. . Mild mitral valve  regurgitation.   4. The aortic valve is tricuspid. Aortic valve regurgitation is not  visualized. Aortic valve sclerosis/calcification is present, without any  evidence of aortic stenosis.   5. The inferior vena cava is normal in size with greater than 50%  respiratory variability, suggesting right atrial pressure of 3 mmHg.    EKG:  EKG not ordered today.   Recent Labs: 10/24/2023: ALT 22; Hemoglobin 15.4; Platelets 117; TSH 1.270  Recent Lipid Panel    Component Value Date/Time   CHOL 100 11/09/2022 1016   TRIG 80 11/09/2022 1016   HDL 40 11/09/2022 1016   CHOLHDL 2.5 11/09/2022 1016   CHOLHDL 2.6 04/22/2014 0902   VLDL 16 04/22/2014 0902   LDLCALC 44 11/09/2022 1016    Physical Exam:    Vital Signs: There were no vitals taken for this visit.    Wt Readings from Last 3  Encounters:  11/26/23 171 lb (77.6 kg)  11/21/23 172 lb (78 kg)  10/24/23 167 lb (75.8 kg)     General: 89 y.o. male in no acute distress. HEENT: Normocephalic and atraumatic. Sclera clear.  Neck: Supple. No carotid bruits. No JVD. Heart: *** RRR. Distinct S1 and S2. No murmurs, gallops, or rubs.  Lungs: No increased work of breathing. Clear to ausculation bilaterally. No wheezes, rhonchi, or rales.  Abdomen: Soft, non-distended, and non-tender to palpation.  Extremities: No lower extremity edema.  Radial and distal pedal pulses 2+ and equal bilaterally. Skin: Warm and dry. Neuro: No focal deficits. Psych: Normal affect. Responds appropriately.   Assessment:    No diagnosis found.  Plan:     Disposition: Follow up in ***   Signed, Aline FORBES Door, PA-C  04/11/2024 6:40 PM    [facility]

## 2024-04-19 ENCOUNTER — Other Ambulatory Visit: Payer: Self-pay | Admitting: Adult Health

## 2024-04-20 NOTE — Progress Notes (Unsigned)
 Cardiology Office Note:  .   Date:  04/22/2024  ID:  Jose [name], DOB [birth year], MRN [id number] PCP: Windy Coy, MD  [facility] Providers Cardiologist:  Redell Shallow, MD Electrophysiologist:  Eulas FORBES Furbish, MD {  History of Present Illness: .   Jose [name] is a 89 y.o. male  with PMHx of CAD (CABG w/ LIMA-LAD, SVG-D1, sequential SVG-OM1/OM2, sequential SVG-PDA/posterior lateral  w/PFO closure 2007; Lexiscan  in 2017 showed low risk study), HLD, chronic HFrEF (EF 45-50% in 2014, 55-60% in 2017, 25-30% in 08/2022, 45-50 % in 09/2021; most recent EF 35-40% in 05/2023), HTN, symptomatic bradycardia s/p MDT dual chamber PPM implanted 10/11/21, Afib (recurrent afib on amio s/p successful DCCV 08/31/2022), CVA in 06/2013, Bilateral carotid stenosis (06/2013: < 40% bilateral stenosis), CKD IIIb (followed by nephrology),  Stage Ia non-small cell lung cancer in the right upper lobe s/p SBRT (10/2023) who reports to [facility] office for follow up.   Last seen in heartcare EP 11/26/2023 with Charlies Arthur, PA-C for follow up. Reported infrequent orthostatic dizziness. Otherwise doing well from cardiac standpoints and no other cardiac complaints. Device Interrogation showed normal function device with no significant arrhythmia. Continued on Amio 200 mg daily, Coreg  3.125  mg BID, Jardiance  10 mg daily, Zetia  10 mg daily, Pravastatin  80 mg daily, Xarelto  15 mg daily, and Torsemide  40 mg daily.   Today, reports dizziness and imbalance that has worsened since beginning of August.  Noted a more severe episode while playing golf in beginning of August resulting in muscle weakness (unknown if unilateral versus bilateral) and balance issues, however denied slurred speech.  Patient was not worked up at that time for this episode.  Notes that PCP suspects patient may have had a stroke and has a pending brain MRI as well as referral to neurology. Patient also reports intermittent dizziness with position  changes associated with SOB.  Denies any SOB with exertion.  Reports LE edema in the ankles X 6 weeks.  He has not tried compression stockings but has at home.  Denies any chest pain, palpitations, orthopnea, PND, syncope.  Reports compliance with medications. Reports eating whatever he wants with occasional high sodium snacks. Patient is able to walk around grocery store without any concerns.  He also does low intensity weight exercises for 10 to 15 minutes about 2-3 times per week. Denies tobacco use/alcohol /drug use. Denies any recent hospitalizations or visits to the emergency department.   ROS: 10 point review of system has been reviewed and considered negative except ones been listed in the HPI.   Studies Reviewed: .   Lexiscan  2017 Nuclear stress EF: 53%. The left ventricular ejection fraction is mildly decreased (45-54%). There was no ST segment deviation noted during stress. There is a medium defect of moderate severity present in the basal inferior, basal inferolateral, mid inferior and mid inferolateral location. The defect is non-reversible. In setting of normal LVF this is consistent with diaphragmatic attenuation artifact. No ischemia is noted This is a low risk study.  06/11/2023: TTE 1. Hypokinesis/akinesis of the inferior, septal, inferolateral  walls(base, mid). Left ventricular ejection fraction, by estimation, is 35  to 40%. The left ventricle has moderately decreased function. The left  ventricular internal cavity size was mildly  dilated. There is mild left ventricular hypertrophy.   2. Right ventricular systolic function is normal. The right ventricular  size is normal.   3. MR is eccentric, directed posteriorly. . Mild mitral valve  regurgitation.   4. The  aortic valve is tricuspid. Aortic valve regurgitation is not  visualized. Aortic valve sclerosis/calcification is present, without any  evidence of aortic stenosis.   5. The inferior vena cava is normal in size with  greater than 50%  respiratory variability, suggesting right atrial pressure of 3 mmHg.   Comparison(s): The left ventricular function has improved.   Risk Assessment/Calculations:    CHA2DS2-VASc Score = 7  This indicates a 11.2% annual risk of stroke. The patient's score is based upon: CHF History: 1 HTN History: 1 Diabetes History: 0 Stroke History: 2 Vascular Disease History: 1 Age Score: 2 Gender Score: 0  Physical Exam:   VS:  BP 122/70   Pulse 60   Ht 5' 9.5 (1.765 m)   Wt 166 lb 12.8 oz (75.7 kg)   SpO2 95%   BMI 24.28 kg/m    Wt Readings from Last 3 Encounters:  04/22/24 166 lb 12.8 oz (75.7 kg)  11/26/23 171 lb (77.6 kg)  11/21/23 172 lb (78 kg)    GEN: Well nourished, well developed in no acute distress while sitting in chair.  NECK: No JVD; No carotid bruits CARDIAC: RRR, no murmurs, rubs, gallops RESPIRATORY:  Clear to auscultation without rales, wheezing or rhonchi  ABDOMEN: Soft, non-tender, non-distended EXTREMITIES:  1+ pitting edema in ankles; No deformity   ASSESSMENT AND PLAN: .   HFrEF (heart failure with reduced ejection fraction) (HCC) LE edema in ankles EF 45-50% in 2014, 55-60% in 2017, 25-30% in 08/2022, 45-50 % in 09/2021; most recent EF 35-40% in 05/2023 ECHO 05/2023: EF 35 to 40%, +RWMA (hypokinesis/akinesis in inferior, septal and inferior lateral walls), Moderately decreased LV function, mildly dilated LV, mild LVH, mild MV regurgitation, AV sclerosis/calcification w/o AS K 3.4 in 10/2022; Cr 2.42 in 11/2023 Reports LE edema in the ankles X 6 weeks.  Only reports SOB associated with dizzy spells but denied with exertion. Denies orthopnea or PND.  Exam shows 1+ pitting edema in ankles. Since SOB only associated with dizziness and not with exertion. Deferred to order echo at this time. Can reconsider if SOB worsens or associated with exertion.  Also consider that SOB may be secondary to lung cancer and COPD. Order BMP, BNP.  If Cr is stable at  baseline then can consider increasing Torsemide  to 60 mg x 3 days then return to 40 mg daily. If Cr is elevated then would defer management of Torsemide  to nephrology.  D/C Coreg  3.125 and start Toprol  XL 25 mg with hopes that Toprol  will not affect BP as much as Coreg .  Continue on Jardiance  10 mg daily, torsemide  40 mg daily Previously discontinued Jardiance  and ARB due to dizziness.  Patient noted once off Jardiance  no change in dizziness and agreed to restart as above.  Previously noted, could consider another attempt at low-dose ARB or hydralazine/nitrate if BP allows. Not on spironolactone due to renal insufficiency. Encouraged daily use of compression socks, leg elevations, low sodium diet, fluid restriction <2L, and daily weights.  Educated to contact our office for weight gain of 2 lbs overnight or 5 lbs in one week. ED precautions discussed.   No repeat ischemic workup with drop in EF, however, would recommend conservative management considering patient's age, lung cancer (NSCLC), and CKD disease.   Primary hypertension Reports Home BP with some episodes of low BP: mainly SBP 110-120s with occasional SBP in 90's.  BP this OV well controlled today: 122/70 Managed by GDMT as above. D/C Coreg  and start Toprol  XL 25  mg as above. Encouraged to continue monitoring BP at home.  Discussed proper BP measurement protocol. Encourage physical activity for 150 minutes per week and heart healthy low sodium diet. Discussed limiting sodium intake to < 2 grams daily.     Orthostatic hypotension  Dizziness with position changes History of CVA Reports intermittent dizziness with position changes associated with SOB. Noted a more severe episode while playing golf in beginning of August resulting in muscle weakness (unknown if unilateral versus bilateral) and balance issues, however denied slurred speech.  Orthostatic vital signs today positive with DBP < 10 from laying to standing (80 to 68).  D/C  Coreg  and start Toprol  XL 25 mg as above. Encouraged slow position change, adequate hydration, compression socks use at night, and low sodium diet.  Continue to follow with Brain MRI and neurology per PCP pending workup.   Coronary artery disease involving native coronary artery of native heart without angina pectoris Hyperlipidemia LDL goal <55 CABG w/ LIMA-LAD, SVG-D1, sequential SVG-OM1/OM2, sequential SVG-PDA/posterior lateral w/PFO closure 2007; Lexiscan  in 2017 showed low risk study No angina symptoms.  10/2022 LDL 44, 09/2023 LFT WNL. Over due to for Lipid panel. Did not have time to address due to acute concerns. Can address at next OV.  D/C Coreg  and start Toprol  XL 25 mg as above.  Continue on pravastatin  80 mg daily, Zetia  10 mg daily. Not on ASA given need for [facility] below.   CHB (complete heart block) (HCC) Cardiac pacemaker in situ Device interrogation 03/2024 showed normal function device with no significant arrhythmia.  Continue to follow with EP.   Persistent atrial fibrillation (HCC) Secondary hypercoagulable state (HCC) recurrent afib on amio s/p successful DCCV 08/31/2022 Reviewed EKG 09/2023: AV paced with HR 60's  On exam noted regular, rate and rhythm.  Denies palpitations or active bleeding.  On AC with CBC WNL in 09/2023. Order CBC per patient request to assess if anemia is contributing to dizziness.  On Amio with LFT and TSH WNL in 09/2023. Recommend annual eye exam.  Continue Amio 200 mg daily and Xarelto  15 mg daily (appropriate dose with CrCl of 22 ml/min)  Stage 3 chronic kidney disease, unspecified whether stage 3a or 3b CKD (HCC) Cr 2.5 in 10/2022; Cr 2.42 in 11/2023 Continue to follow with nephrology   Stage Ia non-small cell lung cancer in the right upper lobe  s/p SBRT (10/2023)  Managed by Pulmonary.    Dispo: Follow up in 4-6 weeks with any APP.   Signed, Lorette CINDERELLA Kapur, PA-C

## 2024-04-22 ENCOUNTER — Ambulatory Visit: Attending: Student | Admitting: Physician Assistant

## 2024-04-22 ENCOUNTER — Encounter: Payer: Self-pay | Admitting: Student

## 2024-04-22 VITALS — BP 122/70 | HR 60 | Ht 69.5 in | Wt 166.8 lb

## 2024-04-22 DIAGNOSIS — C3411 Malignant neoplasm of upper lobe, right bronchus or lung: Secondary | ICD-10-CM | POA: Insufficient documentation

## 2024-04-22 DIAGNOSIS — D6869 Other thrombophilia: Secondary | ICD-10-CM | POA: Insufficient documentation

## 2024-04-22 DIAGNOSIS — I951 Orthostatic hypotension: Secondary | ICD-10-CM | POA: Insufficient documentation

## 2024-04-22 DIAGNOSIS — I5020 Unspecified systolic (congestive) heart failure: Secondary | ICD-10-CM | POA: Insufficient documentation

## 2024-04-22 DIAGNOSIS — R600 Localized edema: Secondary | ICD-10-CM | POA: Insufficient documentation

## 2024-04-22 DIAGNOSIS — Z950 Presence of cardiac pacemaker: Secondary | ICD-10-CM | POA: Diagnosis present

## 2024-04-22 DIAGNOSIS — N183 Chronic kidney disease, stage 3 unspecified: Secondary | ICD-10-CM | POA: Insufficient documentation

## 2024-04-22 DIAGNOSIS — Z8673 Personal history of transient ischemic attack (TIA), and cerebral infarction without residual deficits: Secondary | ICD-10-CM | POA: Insufficient documentation

## 2024-04-22 DIAGNOSIS — E785 Hyperlipidemia, unspecified: Secondary | ICD-10-CM | POA: Diagnosis present

## 2024-04-22 DIAGNOSIS — I4819 Other persistent atrial fibrillation: Secondary | ICD-10-CM | POA: Diagnosis present

## 2024-04-22 DIAGNOSIS — I2510 Atherosclerotic heart disease of native coronary artery without angina pectoris: Secondary | ICD-10-CM | POA: Diagnosis present

## 2024-04-22 DIAGNOSIS — I442 Atrioventricular block, complete: Secondary | ICD-10-CM | POA: Insufficient documentation

## 2024-04-22 DIAGNOSIS — I10 Essential (primary) hypertension: Secondary | ICD-10-CM | POA: Diagnosis not present

## 2024-04-22 DIAGNOSIS — Z79899 Other long term (current) drug therapy: Secondary | ICD-10-CM | POA: Diagnosis present

## 2024-04-22 MED ORDER — METOPROLOL SUCCINATE ER 25 MG PO TB24: 25.0000 mg | ORAL_TABLET | Freq: Every day | ORAL | 1 refills | Status: DC

## 2024-04-22 NOTE — Patient Instructions (Addendum)
 Medication Instructions:  STOP Coreg  3.125mg   START Metoprolol  Succinate (Toprol ) 25mg . Take one tablet daily.  Wear your compression stockings nightly.  Remember to drink up to 60 ounces daily.  *If you need a refill on your cardiac medications before your next appointment, please call your pharmacy*   Lab Work: Labs will be drawn today...................... BMET, BNP, CBC If you have labs (blood work) drawn today and your tests are completely normal, you will receive your results only by: MyChart Message (if you have MyChart) OR A paper copy in the mail If you have any lab test that is abnormal or we need to change your treatment, we will call you to review the results.   Testing/Procedures: No procedures were ordered during today's visit.    Follow-Up: At [facility], you and your health needs are our priority.  As part of our continuing mission to provide you with exceptional heart care, we have created designated Provider Care Teams.  These Care Teams include your primary Cardiologist (physician) and Advanced Practice Providers (APPs -  Physician Assistants and Nurse Practitioners) who all work together to provide you with the care you need, when you need it.  We recommend signing up for the patient portal called MyChart.  Sign up information is provided on this After Visit Summary.  MyChart is used to connect with patients for Virtual Visits (Telemedicine).  Patients are able to view lab/test results, encounter notes, upcoming appointments, etc.  Non-urgent messages can be sent to your provider as well.   To learn more about what you can do with MyChart, go to [email or web address].    Your next appointment:   4-6 week(s)  Provider:   One of our Advanced Practice Providers (APPs): Morse Clause, PA-C  Lamarr Satterfield, NP Miriam Shams, NP  Olivia Pavy, PA-C Josefa Beauvais, NP  Leontine Salen, PA-C Orren Fabry, PA-C  Sour John, PA-C Ernest Dick, NP  Damien Braver, NP Jon Hails, PA-C  Waddell Donath, PA-C    Dayna Dunn, PA-C  Scott Weaver, PA-C Lum Louis, NP Katlyn West, NP Callie Goodrich, PA-C  Evan Williams, PA-C Sheng Haley, PA-C  Xika Zhao, NP Kathleen Johnson, PA-C       Other Instructions Thank you for choosing [facility]!

## 2024-04-23 ENCOUNTER — Ambulatory Visit: Payer: Self-pay | Admitting: Physician Assistant

## 2024-04-23 DIAGNOSIS — I5020 Unspecified systolic (congestive) heart failure: Secondary | ICD-10-CM

## 2024-04-23 LAB — BASIC METABOLIC PANEL WITH GFR
BUN/Creatinine Ratio: 10 (ref 10–24)
BUN: 25 mg/dL (ref 10–36)
CO2: 25 mmol/L (ref 20–29)
Calcium: 9.1 mg/dL (ref 8.6–10.2)
Chloride: 99 mmol/L (ref 96–106)
Creatinine, Ser: 2.44 mg/dL — ABNORMAL HIGH (ref 0.76–1.27)
Glucose: 66 mg/dL — ABNORMAL LOW (ref 70–99)
Potassium: 4.2 mmol/L (ref 3.5–5.2)
Sodium: 139 mmol/L (ref 134–144)
eGFR: 25 mL/min/{1.73_m2} — ABNORMAL LOW

## 2024-04-23 LAB — BRAIN NATRIURETIC PEPTIDE: BNP: 276.3 pg/mL — ABNORMAL HIGH (ref 0.0–100.0)

## 2024-04-23 LAB — CBC
Hematocrit: 46.6 % (ref 37.5–51.0)
Hemoglobin: 14.9 g/dL (ref 13.0–17.7)
MCH: 29.8 pg (ref 26.6–33.0)
MCHC: 32 g/dL (ref 31.5–35.7)
MCV: 93 fL (ref 79–97)
Platelets: 95 x10E3/uL — CL (ref 150–450)
RBC: 5 x10E6/uL (ref 4.14–5.80)
RDW: 15.7 % — ABNORMAL HIGH (ref 11.6–15.4)
WBC: 7.4 x10E3/uL (ref 3.4–10.8)

## 2024-05-01 ENCOUNTER — Encounter: Payer: Self-pay | Admitting: Cardiovascular Disease

## 2024-05-02 NOTE — CV Procedure (Signed)
  Device system confirmed to be MRI conditional, with implant date > 6 weeks ago, and no evidence of abandoned or epicardial leads in review of most recent CXR  Device last cleared by EP Provider: Suzann Riddle   Clearance is good through for 1 year as long as parameters remain stable at time of check. If pt undergoes a cardiac device procedure during that time, they should be re-cleared.   Tachy-therapies to be programmed off if applicable with device back to pre-MRI settings after completion of exam.  [employer] - Programming recommendation received through [employer] App/Tablet  Jose [name], RT  05/02/2024 9:44 AM

## 2024-05-05 ENCOUNTER — Telehealth: Payer: Self-pay | Admitting: Cardiology

## 2024-05-05 NOTE — Telephone Encounter (Signed)
 Spoke with the patient and advised that he will still need to have lab work drawn. Patient verbalized understanding.

## 2024-05-05 NOTE — Telephone Encounter (Signed)
 Pt called in and stated he was out of town last week and could not come in to do the blood work that [employer] wanted him to do.  He would like to know if he needs to still come in this week to get it done or if it is still needed ?    Best number [phone]

## 2024-05-06 LAB — BASIC METABOLIC PANEL WITH GFR
BUN/Creatinine Ratio: 10 (ref 10–24)
BUN: 25 mg/dL (ref 10–36)
CO2: 24 mmol/L (ref 20–29)
Calcium: 9 mg/dL (ref 8.6–10.2)
Chloride: 103 mmol/L (ref 96–106)
Creatinine, Ser: 2.49 mg/dL — ABNORMAL HIGH (ref 0.76–1.27)
Glucose: 59 mg/dL — ABNORMAL LOW (ref 70–99)
Potassium: 4.4 mmol/L (ref 3.5–5.2)
Sodium: 141 mmol/L (ref 134–144)
eGFR: 24 mL/min/1.73 — ABNORMAL LOW (ref >59)

## 2024-05-07 ENCOUNTER — Encounter: Payer: Self-pay | Admitting: Cardiology

## 2024-05-07 ENCOUNTER — Ambulatory Visit (INDEPENDENT_AMBULATORY_CARE_PROVIDER_SITE_OTHER): Admitting: Cardiology

## 2024-05-07 VITALS — BP 110/60 | HR 60 | Ht 69.5 in | Wt 163.0 lb

## 2024-05-07 DIAGNOSIS — I4819 Other persistent atrial fibrillation: Secondary | ICD-10-CM

## 2024-05-07 DIAGNOSIS — Z950 Presence of cardiac pacemaker: Secondary | ICD-10-CM

## 2024-05-07 DIAGNOSIS — I5020 Unspecified systolic (congestive) heart failure: Secondary | ICD-10-CM | POA: Diagnosis not present

## 2024-05-07 DIAGNOSIS — I2510 Atherosclerotic heart disease of native coronary artery without angina pectoris: Secondary | ICD-10-CM | POA: Diagnosis not present

## 2024-05-07 DIAGNOSIS — I10 Essential (primary) hypertension: Secondary | ICD-10-CM

## 2024-05-07 DIAGNOSIS — E785 Hyperlipidemia, unspecified: Secondary | ICD-10-CM

## 2024-05-07 NOTE — Progress Notes (Signed)
 HPI: FU CAD and atrial fibrillation. Patient underwent coronary artery bypass and graft in 2007. He had a LIMA to the LAD, SVG to D1, sequential SVG to OM1 and OM2, sequential SVG to the PDA and posterior lateral. He also had closure of PFO. Abdominal ultrasound May 2014 showed no aneurysm. Had CVA in November of 2014. Transesophageal echocardiogram November 2014 showed normal LV function, mild prolapse of the posterior mitral valve leaflet and an atrial septal aneurysm with negative bubble study. Carotid Dopplers November 2014 showed less than 40% bilateral stenosis. 30 day event monitor showed atrial fibrillation. Nuclear study May 2017 showed ejection fraction 53%.  There was diaphragmatic attenuation but no ischemia.  Had pacemaker placed previously secondary to symptomatic bradycardia.  Also has had recurrent atrial fibrillation on amiodarone .  Patient seen by Dr. Nancey and felt not to be a good candidate for ablation.  Amiodarone  was resumed. Had repeat DCCV 08/31/22. FU echocardiogram October 2024 showed ejection fraction 35 to 40%, mild left ventricular enlargement, mild left ventricular hypertrophy, mild mitral regurgitation.  Since last seen, patient denies dyspnea, chest pain or syncope.  Occasional dizziness with standing.  Minimal pedal edema.  He does have fatigue.  Current Outpatient Medications  Medication Sig Dispense Refill   amiodarone  (PACERONE ) 200 MG tablet Take 1 tablet (200 mg total) by mouth daily. 90 tablet 3   Bacitracin (ANTIBIOTIC EX) Apply 1 Application topically daily as needed (wound care).     cholecalciferol (VITAMIN D) 25 MCG (1000 UNIT) tablet Take 1,000 Units by mouth in the morning.     COD LIVER OIL PO Take 1 capsule by mouth at bedtime.      empagliflozin  (JARDIANCE ) 10 MG TABS tablet TAKE 1 TABLET BY MOUTH EVERY DAY BEFORE BREAKFAST 90 tablet 1   ezetimibe  (ZETIA ) 10 MG tablet Take 10 mg by mouth every morning.     famotidine (PEPCID) 40 MG tablet Take 40 mg  by mouth daily.     loratadine (CLARITIN) 10 MG tablet Take 10 mg by mouth daily as needed for allergies.     metoprolol  succinate (TOPROL  XL) 25 MG 24 hr tablet Take 1 tablet (25 mg total) by mouth daily. 30 tablet 1   Multiple Vitamin (MULTIVITAMIN WITH MINERALS) TABS tablet Take 1 tablet by mouth in the morning.     Polyethyl Glycol-Propyl Glycol (SYSTANE ULTRA OP) Place 1-2 drops into both eyes in the morning and at bedtime.     pravastatin  (PRAVACHOL ) 80 MG tablet Take 80 mg by mouth at bedtime.     saw palmetto 160 MG capsule Take 160 mg by mouth 2 (two) times daily.     tadalafil (CIALIS) 5 MG tablet Take 5 mg by mouth daily.     TESTOSTERONE TD Place 1 mL onto the skin See admin instructions. 10% testosterone cream compounded at [facility]  - apply 1 ml topically to shoulders once every morning     torsemide  (DEMADEX ) 20 MG tablet Take 2 tablets (40 mg total) by mouth daily. 180 tablet 2   White Petrolatum (VASELINE EX) Apply 1 Application topically daily.     XARELTO  15 MG TABS tablet TAKE ONE (1) TABLET BY MOUTH EACH DAY WITH SUPPER 90 tablet 2   No current facility-administered medications for this visit.     Past Medical History:  Diagnosis Date   At risk for sleep apnea    STOP-BANG= 5   SENT TO PCP 05-20-2014   Bilateral carotid artery stenosis  mild bilateral proximcal ICA  40% per duplex 11/ 2014   Coronary artery disease    a. s/p CABG in 2007 w/ LIMA-LAD, SVG-D1, SVG-1st & 2nd Mrg, SVG-PDA and posterior lateral   ED (erectile dysfunction)    Eye infection 12/27/2014   GERD (gastroesophageal reflux disease)    History of embolic stroke no residual   89/ 2014  --  right PCA and MCA branch infarts (left side vision difficulties)   Hypertension    Other malaise and fatigue    Paroxysmal atrial fibrillation (HCC)    Pneumonia 10/27/2015   led to CHF, in hospital x 6 days   S/P CABG x 6    2007   Ureteral obstruction, right     Past Surgical History:   Procedure Laterality Date   APPENDECTOMY  1950's   CARDIAC CATHETERIZATION  10-31-2005  dr wall   severe three vessel/  perserved LV   CARDIOVASCULAR STRESS TEST  last one 05-19-2014  dr pietro   low risk lexiscan  no exercise study/ small inferobasal infarct with no ischemia /  ef 53%   CARDIOVERSION N/A 10/03/2021   Procedure: CARDIOVERSION;  Surgeon: Santo Stanly LABOR, MD;  Location: [facility] ENDOSCOPY;  Service: Cardiovascular;  Laterality: N/A;   CARDIOVERSION N/A 11/25/2021   Procedure: CARDIOVERSION;  Surgeon: Alvan Ronal BRAVO, MD;  Location: [facility] ENDOSCOPY;  Service: Cardiovascular;  Laterality: N/A;   CARDIOVERSION N/A 12/28/2021   Procedure: CARDIOVERSION;  Surgeon: Sheena Pugh, DO;  Location: [facility] ENDOSCOPY;  Service: Cardiovascular;  Laterality: N/A;   CARDIOVERSION N/A 08/31/2022   Procedure: CARDIOVERSION;  Surgeon: Shlomo Wilbert SAUNDERS, MD;  Location: [facility] ENDOSCOPY;  Service: Cardiovascular;  Laterality: N/A;   CORONARY ARTERY BYPASS GRAFT  11-01-2005  dr dusty   CLOSURE OF PFO/  LIMA to LAD,  SVG to D1,  SVG to 1st & 2nd  MARGINAL branches, SVG  to PDA and posterior lateral   CYSTOSCOPY W/ URETERAL STENT PLACEMENT Right 05/21/2014   Procedure: CYSTOSCOPY WITH RETROGRADE PYELOGRAM/URETERAL STENT PLACEMENT;  Surgeon: Arlena LILLETTE Gal, MD;  Location: [facility];  Service: Urology;  Laterality: Right;   CYSTOSCOPY WITH URETEROSCOPY Right 05/21/2014   Procedure: CYSTOSCOPY WITH URETEROSCOPY;  Surgeon: Arlena LILLETTE Gal, MD;  Location: [facility];  Service: Urology;  Laterality: Right;   INGUINAL HERNIA REPAIR Bilateral    LAPAROSCOPIC CHOLECYSTECTOMY  10-02-2000   PACEMAKER IMPLANT N/A 10/11/2021   Procedure: PACEMAKER IMPLANT;  Surgeon: Waddell Danelle ORN, MD;  Location: [facility] INVASIVE CV LAB;  Service: Cardiovascular;  Laterality: N/A;   RIGHT URETER OBSTRUCTION SURGERY  age 89   TEE WITHOUT CARDIOVERSION N/A 07/17/2013   Procedure: TRANSESOPHAGEAL ECHOCARDIOGRAM  (TEE);  Surgeon: Maude JAYSON Emmer, MD;  Location: [facility] ENDOSCOPY;  Service: Cardiovascular;  Laterality: N/A;  normal LV size, mild prolapse of posterior mitral valve leaflet,  mild MR and TR, normal AV,  no LAA thrombus,  atrial septal aneurysm with no PFO/ ASD and negative bubble study, normal RV, normal aorta with no debris    Social History   Socioeconomic History   Marital status: Married    Spouse name: maxine   Number of children: 4   Years of education: college4   Highest education level: Not on file  Occupational History   Occupation: retired  Tobacco Use   Smoking status: Former    Current packs/day: 0.00    Average packs/day: 1 pack/day for 35.0 years (35.0 ttl pk-yrs)    Types: Cigarettes    Start date:  08/29/1942    Quit date: 08/29/1977    Years since quitting: 46.7   Smokeless tobacco: Never  Vaping Use   Vaping status: Never Used  Substance and Sexual Activity   Alcohol  use: Not Currently   Drug use: No   Sexual activity: Not on file  Other Topics Concern   Not on file  Social History Narrative   Patient is married with 4 children.   Patient is right handed.   Patient has college education.   Patient drinks decaff coffee.   Social Drivers of Corporate investment banker Strain: Not on file  Food Insecurity: No Food Insecurity (10/02/2023)   Hunger Vital Sign    Worried About Running Out of Food in the Last Year: Never true    Ran Out of Food in the Last Year: Never true  Transportation Needs: No Transportation Needs (10/02/2023)   PRAPARE - Administrator, Civil Service (Medical): No    Lack of Transportation (Non-Medical): No  Physical Activity: Not on file  Stress: Not on file  Social Connections: Not on file  Intimate Partner Violence: Not At Risk (10/02/2023)   Humiliation, Afraid, Rape, and Kick questionnaire    Fear of Current or Ex-Partner: No    Emotionally Abused: No    Physically Abused: No    Sexually Abused: No    Family History   Problem Relation Age of Onset   Heart disease Mother        PPM   Hypertension Mother    Lymphoma Father    Prostate cancer Father    Prostate cancer Brother    Aneurysm Brother        REPAIR   CAD Brother        CABG   Other Brother        STENTS IN LEGS   Other Sister        STOMACH ISSUES   Depression Sister    Lung cancer Brother        Asbestosis    ROS: Fatigue but no fevers or chills, productive cough, hemoptysis, dysphasia, odynophagia, melena, hematochezia, dysuria, hematuria, rash, seizure activity, orthopnea, PND, claudication. Remaining systems are negative.  Physical Exam: Well-developed well-nourished in no acute distress.  Skin is warm and dry.  HEENT is normal.  Neck is supple.  Chest is clear to auscultation with normal expansion.  Cardiovascular exam is regular rate and rhythm.  Abdominal exam nontender or distended. No masses palpated. Extremities show trace edema. neuro grossly intact  EKG Interpretation Date/Time:  Wednesday May 07 2024 14:34:00 EDT Ventricular Rate:  60 PR Interval:  176 QRS Duration:  136 QT Interval:  486 QTC Calculation: 486 R Axis:   -44  Text Interpretation: AV dual-paced rhythm Confirmed by Pietro Rogue ([id number]) on 05/07/2024 2:43:57 PM    A/P  1 paroxysmal atrial fibrillation-patient remains in atrial paced rhythm.  Continue amiodarone  and Xarelto .  2 chronic combined systolic/diastolic congestive heart failure-ARB discontinued previously due to dizziness.  If symptoms persist may need to discontinue Toprol  to allow blood pressure to run higher.  Continue Jardiance  and present dose of diuretic.  3 chronic stage IIIb kidney disease-patient is monitored by nephrology.  4 coronary artery disease-he is not having chest pain.  Continue statin.  5 hyperlipidemia-continue pravastatin  and Zetia .  6 History of pacemaker-managed by electrophysiology.  7 hypertension-blood pressure is controlled.  Continue present  medical regimen.  Rogue Pietro, MD

## 2024-05-07 NOTE — Patient Instructions (Addendum)
   Follow-Up: At [facility], you and your health needs are our priority.  As part of our continuing mission to provide you with exceptional heart care, our providers are all part of one team.  This team includes your primary Cardiologist (physician) and Advanced Practice Providers or APPs (Physician Assistants and Nurse Practitioners) who all work together to provide you with the care you need, when you need it.  Your next appointment:     07/23/24 @ 3 pm

## 2024-05-08 ENCOUNTER — Ambulatory Visit (HOSPITAL_COMMUNITY)
Admission: RE | Admit: 2024-05-08 | Discharge: 2024-05-08 | Disposition: A | Discharge status: Home / Self Care | Source: Ambulatory Visit | Attending: Family Medicine | Admitting: Family Medicine

## 2024-05-08 DIAGNOSIS — R519 Headache, unspecified: Secondary | ICD-10-CM | POA: Insufficient documentation

## 2024-05-08 DIAGNOSIS — R270 Ataxia, unspecified: Secondary | ICD-10-CM | POA: Diagnosis present

## 2024-05-08 MED ORDER — GADOBUTROL 1 MMOL/ML IV SOLN
10.0000 mL | Freq: Once | INTRAVENOUS | Status: AC | PRN
  Administered 2024-05-08: 10 mL via INTRAVENOUS

## 2024-05-08 NOTE — Progress Notes (Signed)
 Patient was monitored by this RN during MRI scan due to presence of a pacemaker. Cardiac rhythm was continuously monitored throughout the procedure. Prior to the start of the scan, the pacemaker was placed in MRI-safe mode by the MRI technician. Following the completion of the scan, the device was returned to its pre-MRI settings. Neurological status and orientation post-procedure were unchanged from baseline.   Pre-procedure Heart Rate (Prior to being placed in MRI safe mode): 60 Post-procedure Heart Rate (Once pacemaker is returned to baseline mode): 60

## 2024-05-13 ENCOUNTER — Other Ambulatory Visit: Payer: Self-pay | Admitting: Family Medicine

## 2024-05-13 DIAGNOSIS — J0140 Acute pansinusitis, unspecified: Secondary | ICD-10-CM

## 2024-05-15 ENCOUNTER — Encounter: Payer: Self-pay | Admitting: Emergency Medicine

## 2024-05-15 ENCOUNTER — Ambulatory Visit
Admission: RE | Admit: 2024-05-15 | Discharge: 2024-05-15 | Disposition: A | Discharge status: Home / Self Care | Source: Ambulatory Visit | Attending: Family Medicine | Admitting: Family Medicine

## 2024-05-15 ENCOUNTER — Ambulatory Visit: Admitting: Emergency Medicine

## 2024-05-15 VITALS — BP 122/68 | HR 65 | Ht 69.5 in | Wt 170.0 lb

## 2024-05-15 DIAGNOSIS — J849 Interstitial pulmonary disease, unspecified: Secondary | ICD-10-CM | POA: Diagnosis not present

## 2024-05-15 DIAGNOSIS — R490 Dysphonia: Secondary | ICD-10-CM

## 2024-05-15 DIAGNOSIS — Z87891 Personal history of nicotine dependence: Secondary | ICD-10-CM

## 2024-05-15 DIAGNOSIS — C3411 Malignant neoplasm of upper lobe, right bronchus or lung: Secondary | ICD-10-CM | POA: Diagnosis not present

## 2024-05-15 DIAGNOSIS — R1310 Dysphagia, unspecified: Secondary | ICD-10-CM

## 2024-05-15 NOTE — Progress Notes (Signed)
 Subjective:    Patient ID: Jose [name], male    DOB: [birth year], 89 y.o.   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  We have been following an abnormal CT scan of the chest with a 1.5 x 1.7 apical right upper lobe pulmonary nodule suspicious for possible non-small cell lung cancer.  I referred him to see radiation oncology to discuss the pros and cons of SBRT.  He saw Dr. Patrcia and elected to treat empirically, last treatment on 11/02/2023. Today he reports that he did quite well - he had a bit of dysphagia post-treatment. Now feels normal. No chest or skin discomfort. He is not having any SOB or cough. He is having eye irritation from allergies, prn loratadine.   ROV 05/15/2024 --Jose [name] is 89, former smoker with a history of CAD/CABG, hypertension, A-fib, CVA.  He has been followed in our office for cryptogenic organizing pneumonia (Dr. Darlean).  He had an evolving apical right upper lobe pulmonary nodule suspicious for non-small cell lung cancer and underwent empiric SBRT with Dr. Patrcia, last in 10/2023.  Most recent CT chest was 04/01/2024. Based on June imaging he was treated with 1 month prednisone  for possible radiation pneumonitis. He didn't feel any different on the prednisone . He feels that his breathing is doing ok - he may be slightly more SOB than 1 year ago. He has cough in the morning, produces some white mucous, no purulence. No fever.   CT scan of the chest 04/01/2024 reviewed by me, shows some evolving postradiation change in the previously treated 2.7 x 2.0 cm right upper lobe nodule.  Now the opacity is 3.3 cm.  Some new diffuse ground glass opacities, question inflammatory   Review of Systems As per HPI      Objective:   Physical Exam Vitals:   05/15/24 0943  BP: 122/68  Pulse: 65  SpO2: 93%  Weight: 170 lb (77.1 kg)  Height: 5' 9.5 (1.765  m)    Gen: Pleasant, well-nourished, in no distress,  normal affect  ENT: No lesions,  mouth clear,  oropharynx clear, no postnasal drip  Neck: No JVD, no stridor  Lungs: No use of accessory muscles, no crackles or wheezing on normal respiration, no wheeze on forced expiration  Cardiovascular: RRR, heart sounds normal, no murmur or gallops, no peripheral edema  Musculoskeletal: No deformities, no cyanosis or clubbing  Neuro: alert, awake, non focal  Skin: Warm, no lesions or rash      Assessment & Plan:  Interstitial lung disease (HCC) Reviewed his CT scans of the chest from June and August.  He has evolving scarring changes in the right upper lobe postradiation as well as some more diffuse ground glass.  Differential diagnosis here includes radiation change (certainly possible since he only finished in March 2025).  He was treated for possible radiation pneumonitis with 2 months of tapering steroids but with progression on CT.  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  He does not report any symptoms that would be consistent with opportunistic infection.  Most consider amiodarone  change.  The appearance would be inconsistent with progression of his lung cancer.  We will reimage at the 88-month mark in November and will perform PET instead of CT.  Depending on that result we will have to consider whether we believe  this is anticipated radiation change, some other inflammatory process.  If there is progression then may need to talk to Dr. Pietro about his volume status (he looks euvolemic) and the pros and cons of the amiodarone .  I will hold off on any further steroids at this time.  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  I will make his next scan a PET scan.      Lamar Chris, MD, PhD 05/15/2024, 10:16  AM [facility] [phone] or if no answer before 7:00PM call [phone] For any issues after 7:00PM please call eLink [phone]

## 2024-05-15 NOTE — Assessment & Plan Note (Addendum)
 Reviewed his CT scans of the chest from June and August.  He has evolving scarring changes in the right upper lobe postradiation as well as some more diffuse ground glass.  Differential diagnosis here includes radiation change (certainly possible since he only finished in March 2025).  He was treated for possible radiation pneumonitis with 2 months of tapering steroids but with progression on CT.  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  He does not report any symptoms that would be consistent with opportunistic infection.  Most consider amiodarone  change.  The appearance would be inconsistent with progression of his lung cancer.  We will reimage at the 59-month mark in November and will perform PET instead of CT.  Depending on that result we will have to consider whether we believe this is anticipated radiation change, some other inflammatory process.  If there is progression then may need to talk to Dr. Pietro about his volume status (he looks euvolemic) and the pros and cons of the amiodarone .  I will hold off on any further steroids at this time.

## 2024-05-15 NOTE — Patient Instructions (Signed)
 We reviewed your CT scan of the chest from August today. We will arrange for a PET scan to be done in November. Follow with Dr. Shelah in November after your scan so we can review those results together. Please call if you develop any new respiratory symptoms, worsening shortness of breath, cough, mucus, etc.

## 2024-05-15 NOTE — Assessment & Plan Note (Signed)
 Suspicion for progressive non-small cell lung cancer is low given the appearance on his CT but must consider.  I will make his next scan a PET scan.

## 2024-05-16 ENCOUNTER — Other Ambulatory Visit: Payer: Self-pay | Admitting: Family Medicine

## 2024-05-16 DIAGNOSIS — R490 Dysphonia: Secondary | ICD-10-CM

## 2024-05-16 DIAGNOSIS — R1311 Dysphagia, oral phase: Secondary | ICD-10-CM

## 2024-05-22 NOTE — Progress Notes (Signed)
 Remote PPM Transmission

## 2024-05-26 ENCOUNTER — Ambulatory Visit: Admitting: Neurology

## 2024-05-26 NOTE — Addendum Note (Signed)
 Encounter addended by: Thayne Consuelo DEL on: 05/26/2024 9:34 AM  Actions taken: Imaging Exam ended, Charge Capture section accepted

## 2024-05-30 ENCOUNTER — Ambulatory Visit: Admitting: Physician Assistant

## 2024-06-03 ENCOUNTER — Other Ambulatory Visit: Payer: Self-pay | Admitting: Family Medicine

## 2024-06-03 ENCOUNTER — Ambulatory Visit
Admission: RE | Admit: 2024-06-03 | Discharge: 2024-06-03 | Disposition: A | Source: Ambulatory Visit | Attending: Family Medicine | Admitting: Family Medicine

## 2024-06-03 DIAGNOSIS — M5410 Radiculopathy, site unspecified: Secondary | ICD-10-CM

## 2024-06-03 DIAGNOSIS — J0140 Acute pansinusitis, unspecified: Secondary | ICD-10-CM

## 2024-06-04 ENCOUNTER — Ambulatory Visit: Admitting: Physician Assistant

## 2024-06-10 ENCOUNTER — Other Ambulatory Visit: Payer: Self-pay | Admitting: Physician Assistant

## 2024-06-30 ENCOUNTER — Encounter: Payer: Self-pay | Admitting: Radiology

## 2024-07-02 ENCOUNTER — Other Ambulatory Visit: Payer: Self-pay | Admitting: Cardiology

## 2024-07-02 DIAGNOSIS — I5032 Chronic diastolic (congestive) heart failure: Secondary | ICD-10-CM

## 2024-07-07 ENCOUNTER — Ambulatory Visit: Admitting: Orthopedic Surgery

## 2024-07-07 DIAGNOSIS — L97521 Non-pressure chronic ulcer of other part of left foot limited to breakdown of skin: Secondary | ICD-10-CM | POA: Diagnosis not present

## 2024-07-07 DIAGNOSIS — B351 Tinea unguium: Secondary | ICD-10-CM | POA: Diagnosis not present

## 2024-07-08 ENCOUNTER — Encounter: Payer: Self-pay | Admitting: Orthopedic Surgery

## 2024-07-08 NOTE — Progress Notes (Signed)
 Office Visit Note   Patient: Jose [name]           Date of Birth: 27-Feb-1931           MRN: [id number] Visit Date: 07/07/2024              Requested by: Windy Coy, MD [address] [place],  [place] [address] PCP: Windy Coy, MD  Chief Complaint  Patient presents with   Right Foot - Follow-up    Bilateral toenail trimming   Left Foot - Follow-up      HPI: Discussed the use of AI scribe software for clinical note transcription with the patient, who gave verbal consent to proceed.  History of Present Illness Jose [name] is a 89 year old male who presents with hyperkeratotic lesions and onychomycosis.  He has hyperkeratotic lesions on his feet and right calf, present for about two to three weeks. These lesions are described as having a 'hard shell' and have been recurring despite previous removal. Similar lesions in the past were biopsied.  He experiences numbness in his leg, which he attributes to a recent episode of shingles. The numbness affects his leg and foot and has been present for approximately three weeks. He uses a rollator for mobility due to his leg occasionally 'locking up'. Despite receiving three shingles vaccinations, he experienced an outbreak, initially mistaking it for sciatica before the rash appeared.  He is on a blood thinner, which he believes contributes to bruising on his hands. Additionally, he has thickened, discolored toenails on all ten toes, which he is unable to trim himself due to their condition.     Assessment & Plan: Visit Diagnoses:  1. Onychomycosis   2. Non-pressure chronic ulcer of other part of left foot limited to breakdown of skin (HCC)     Plan: Assessment and Plan Assessment & Plan Hyperkeratotic lesions of left foot and right calf Hyperkeratotic lesion beneath the fourth metatarsal head on the left foot and a hyperkeratotic lesion on the right calf. The lesion on the right calf was removed easily  with healthy granulation tissue at the base. Differential diagnosis includes precancerous lesions. - Follow-up with dermatology for evaluation of lesions as he may be precancerous.  Onychomycosis of toenails Thickened, discolored onychomycotic nails on all ten toes. Unable to safely trim nails on his own. - Continue regular nail trimming as needed.      Follow-Up Instructions: No follow-ups on file.   Ortho Exam  Patient is alert, oriented, no adenopathy, well-dressed, normal affect, normal respiratory effort. Physical Exam EXTREMITIES: Hyperkeratotic lesion beneath the fourth metatarsal head of the left foot. Thickened, discolored onychomycotic nails on all ten toes. Hyperkeratotic lesion on the right calf. Healthy granulation tissue at the base of the lesion.  Nails trimmed x 10 without complication.      Imaging: No results found. No images are attached to the encounter.  Labs: Lab Results  Component Value Date   HGBA1C 6.2 (H) 07/10/2013   ESRSEDRATE 12 01/13/2016   ESRSEDRATE 20 12/21/2015   ESRSEDRATE 94 (H) 11/26/2015   REPTSTATUS 11/24/2015 FINAL 11/24/2015   REPTSTATUS 11/28/2015 FINAL 11/24/2015   GRAMSTAIN  11/24/2015    ABUNDANT WBC PRESENT,BOTH PMN AND MONONUCLEAR FEW SQUAMOUS EPITHELIAL CELLS PRESENT MODERATE GRAM POSITIVE COCCI IN PAIRS MODERATE GRAM NEGATIVE RODS Performed at [facility]    CULT  11/24/2015    NORMAL OROPHARYNGEAL FLORA Performed at [facility]  Component Value Date   ALBUMIN 4.2 10/24/2023   ALBUMIN 4.1 11/09/2022   ALBUMIN 4.0 11/08/2021    Lab Results  Component Value Date   MG 2.0 10/10/2021   MG 1.6 (L) 11/22/2015   No results found for: VD25OH  No results found for: PREALBUMIN    Latest Ref Rng & Units 04/22/2024   11:08 AM 10/24/2023    1:43 PM 11/09/2022   10:16 AM  CBC EXTENDED  WBC 3.4 - 10.8 x10E3/uL 7.4  5.5  5.7   RBC 4.14 - 5.80 x10E6/uL 5.00  5.19  5.34    Hemoglobin 13.0 - 17.7 g/dL 85.0  84.5  85.0   HCT 37.5 - 51.0 % 46.6  48.2  47.0   Platelets 150 - 450 x10E3/uL 95  117  138      There is no height or weight on file to calculate BMI.  Orders:  No orders of the defined types were placed in this encounter.  No orders of the defined types were placed in this encounter.    Procedures: No procedures performed  Clinical Data: No additional findings.  ROS:  All other systems negative, except as noted in the HPI. Review of Systems  Objective: Vital Signs: There were no vitals taken for this visit.  Specialty Comments:  No specialty comments available.  PMFS History: Patient Active Problem List   Diagnosis Date Noted   Primary non-small cell carcinoma of upper lobe of right lung (HCC) 10/12/2023   Pulmonary nodule 1 cm or greater in diameter 08/07/2023   Pacemaker 01/13/2022   Symptomatic bradycardia 10/10/2021   History of stroke 01/05/2016   Chronic respiratory failure (HCC) 12/16/2015   HSV-1 (herpes simplex virus 1) infection 12/16/2015   Interstitial lung disease (HCC) 11/27/2015   BOOP (bronchiolitis obliterans with organizing pneumonia) (HCC) 11/27/2015   Elevated troponin 11/27/2015   Hydronephrosis, right 11/27/2015   SOB (shortness of breath)    Acute respiratory failure with hypoxia (HCC) 11/26/2015   Chronic kidney disease (CKD), stage III (moderate) (HCC) 11/26/2015   Acute on chronic diastolic (congestive) heart failure (HCC)    Lobar pneumonia    Acute on chronic combined systolic (congestive) and diastolic (congestive) heart failure (HCC) 11/22/2015   Atrial fibrillation (HCC) 09/10/2013   Occlusion and stenosis of vertebral artery with cerebral infarction (HCC) 07/09/2013   Visual field loss 07/09/2013   HYPERLIPIDEMIA-MIXED 12/23/2009   CAD, NATIVE VESSEL 12/15/2009   ERECTILE DYSFUNCTION 12/10/2008   Essential hypertension 12/10/2008   FATIGUE 12/10/2008   Past Medical History:  Diagnosis  Date   At risk for sleep apnea    STOP-BANG= 5   SENT TO PCP 05-20-2014   Bilateral carotid artery stenosis    mild bilateral proximcal ICA  40% per duplex 11/ 2014   Coronary artery disease    a. s/p CABG in 2007 w/ LIMA-LAD, SVG-D1, SVG-1st & 2nd Mrg, SVG-PDA and posterior lateral   ED (erectile dysfunction)    Eye infection 12/27/2014   GERD (gastroesophageal reflux disease)    History of embolic stroke no residual   89/ 2014  --  right PCA and MCA branch infarts (left side vision difficulties)   Hypertension    Other malaise and fatigue    Paroxysmal atrial fibrillation (HCC)    Pneumonia 10/27/2015   led to CHF, in hospital x 6 days   S/P CABG x 6    2007   Ureteral obstruction, right     Family History  Problem Relation  Age of Onset   Heart disease Mother        PPM   Hypertension Mother    Lymphoma Father    Prostate cancer Father    Prostate cancer Brother    Aneurysm Brother        REPAIR   CAD Brother        CABG   Other Brother        STENTS IN LEGS   Other Sister        STOMACH ISSUES   Depression Sister    Lung cancer Brother        Asbestosis    Past Surgical History:  Procedure Laterality Date   APPENDECTOMY  1950's   CARDIAC CATHETERIZATION  10-31-2005  dr wall   severe three vessel/  perserved LV   CARDIOVASCULAR STRESS TEST  last one 05-19-2014  dr pietro   low risk lexiscan  no exercise study/ small inferobasal infarct with no ischemia /  ef 53%   CARDIOVERSION N/A 10/03/2021   Procedure: CARDIOVERSION;  Surgeon: Santo Stanly LABOR, MD;  Location: [facility] ENDOSCOPY;  Service: Cardiovascular;  Laterality: N/A;   CARDIOVERSION N/A 11/25/2021   Procedure: CARDIOVERSION;  Surgeon: Alvan Ronal BRAVO, MD;  Location: [facility] ENDOSCOPY;  Service: Cardiovascular;  Laterality: N/A;   CARDIOVERSION N/A 12/28/2021   Procedure: CARDIOVERSION;  Surgeon: Sheena Pugh, DO;  Location: [facility] ENDOSCOPY;  Service: Cardiovascular;  Laterality: N/A;   CARDIOVERSION N/A 08/31/2022    Procedure: CARDIOVERSION;  Surgeon: Shlomo Wilbert SAUNDERS, MD;  Location: [facility] ENDOSCOPY;  Service: Cardiovascular;  Laterality: N/A;   CORONARY ARTERY BYPASS GRAFT  11-01-2005  dr dusty   CLOSURE OF PFO/  LIMA to LAD,  SVG to D1,  SVG to 1st & 2nd  MARGINAL branches, SVG  to PDA and posterior lateral   CYSTOSCOPY W/ URETERAL STENT PLACEMENT Right 05/21/2014   Procedure: CYSTOSCOPY WITH RETROGRADE PYELOGRAM/URETERAL STENT PLACEMENT;  Surgeon: Arlena LILLETTE Gal, MD;  Location: [facility];  Service: Urology;  Laterality: Right;   CYSTOSCOPY WITH URETEROSCOPY Right 05/21/2014   Procedure: CYSTOSCOPY WITH URETEROSCOPY;  Surgeon: Arlena LILLETTE Gal, MD;  Location: [facility];  Service: Urology;  Laterality: Right;   INGUINAL HERNIA REPAIR Bilateral    LAPAROSCOPIC CHOLECYSTECTOMY  10-02-2000   PACEMAKER IMPLANT N/A 10/11/2021   Procedure: PACEMAKER IMPLANT;  Surgeon: Waddell Danelle ORN, MD;  Location: [facility] INVASIVE CV LAB;  Service: Cardiovascular;  Laterality: N/A;   RIGHT URETER OBSTRUCTION SURGERY  age 30   TEE WITHOUT CARDIOVERSION N/A 07/17/2013   Procedure: TRANSESOPHAGEAL ECHOCARDIOGRAM (TEE);  Surgeon: Maude JAYSON Emmer, MD;  Location: [facility] ENDOSCOPY;  Service: Cardiovascular;  Laterality: N/A;  normal LV size, mild prolapse of posterior mitral valve leaflet,  mild MR and TR, normal AV,  no LAA thrombus,  atrial septal aneurysm with no PFO/ ASD and negative bubble study, normal RV, normal aorta with no debris   Social History   Occupational History   Occupation: retired  Tobacco Use   Smoking status: Former    Current packs/day: 0.00    Average packs/day: 1 pack/day for 35.0 years (35.0 ttl pk-yrs)    Types: Cigarettes    Start date: 08/29/1942    Quit date: 08/29/1977    Years since quitting: 46.8   Smokeless tobacco: Never  Vaping Use   Vaping status: Never Used  Substance and Sexual Activity   Alcohol  use: Not Currently   Drug use: No   Sexual activity: Not on file

## 2024-07-10 ENCOUNTER — Ambulatory Visit (INDEPENDENT_AMBULATORY_CARE_PROVIDER_SITE_OTHER): Payer: PRIVATE HEALTH INSURANCE

## 2024-07-10 ENCOUNTER — Encounter (HOSPITAL_COMMUNITY)
Admission: RE | Admit: 2024-07-10 | Discharge: 2024-07-10 | Disposition: A | Discharge status: Home / Self Care | Source: Ambulatory Visit | Attending: Emergency Medicine | Admitting: Emergency Medicine

## 2024-07-10 DIAGNOSIS — C3411 Malignant neoplasm of upper lobe, right bronchus or lung: Secondary | ICD-10-CM | POA: Diagnosis present

## 2024-07-10 DIAGNOSIS — I4819 Other persistent atrial fibrillation: Secondary | ICD-10-CM

## 2024-07-10 LAB — CUP PACEART REMOTE DEVICE CHECK
Battery Remaining Longevity: 103 mo
Battery Voltage: 3.01 V
Brady Statistic AP VP Percent: 92.28 %
Brady Statistic AP VS Percent: 6.48 %
Brady Statistic AS VP Percent: 0.09 %
Brady Statistic AS VS Percent: 1.14 %
Brady Statistic RA Percent Paced: 99.83 %
Brady Statistic RV Percent Paced: 92.37 %
Date Time Interrogation Session: 20251113074424
Implantable Lead Connection Status: 753985
Implantable Lead Connection Status: 753985
Implantable Lead Implant Date: 20230214
Implantable Lead Implant Date: 20230214
Implantable Lead Location: 753859
Implantable Lead Location: 753860
Implantable Lead Model: 3830
Implantable Lead Model: 5076
Implantable Pulse Generator Implant Date: 20230214
Lead Channel Impedance Value: 304 Ohm
Lead Channel Impedance Value: 342 Ohm
Lead Channel Impedance Value: 380 Ohm
Lead Channel Impedance Value: 475 Ohm
Lead Channel Pacing Threshold Amplitude: 0.625 V
Lead Channel Pacing Threshold Amplitude: 0.75 V
Lead Channel Pacing Threshold Pulse Width: 0.4 ms
Lead Channel Pacing Threshold Pulse Width: 0.4 ms
Lead Channel Sensing Intrinsic Amplitude: 16 mV
Lead Channel Sensing Intrinsic Amplitude: 16 mV
Lead Channel Sensing Intrinsic Amplitude: 3.75 mV
Lead Channel Sensing Intrinsic Amplitude: 3.75 mV
Lead Channel Setting Pacing Amplitude: 2 V
Lead Channel Setting Pacing Amplitude: 2.5 V
Lead Channel Setting Pacing Pulse Width: 0.4 ms
Lead Channel Setting Sensing Sensitivity: 0.9 mV
Zone Setting Status: 755011
Zone Setting Status: 755011

## 2024-07-10 LAB — GLUCOSE, CAPILLARY: Glucose-Capillary: 102 mg/dL — ABNORMAL HIGH (ref 70–99)

## 2024-07-10 MED ORDER — FLUDEOXYGLUCOSE F - 18 (FDG) INJECTION
8.5000 | Freq: Once | INTRAVENOUS | Status: AC | PRN
  Administered 2024-07-10: 8.5 via INTRAVENOUS

## 2024-07-13 ENCOUNTER — Ambulatory Visit: Payer: Self-pay | Admitting: Internal Medicine

## 2024-07-15 NOTE — Progress Notes (Signed)
 Remote PPM Transmission

## 2024-07-16 ENCOUNTER — Encounter: Payer: Self-pay | Admitting: Emergency Medicine

## 2024-07-16 ENCOUNTER — Ambulatory Visit (INDEPENDENT_AMBULATORY_CARE_PROVIDER_SITE_OTHER): Admitting: Emergency Medicine

## 2024-07-16 VITALS — BP 124/66 | HR 97 | Temp 97.4°F | Ht 69.5 in | Wt 160.4 lb

## 2024-07-16 DIAGNOSIS — C3411 Malignant neoplasm of upper lobe, right bronchus or lung: Secondary | ICD-10-CM | POA: Diagnosis not present

## 2024-07-16 DIAGNOSIS — J8489 Other specified interstitial pulmonary diseases: Secondary | ICD-10-CM | POA: Diagnosis not present

## 2024-07-16 DIAGNOSIS — J849 Interstitial pulmonary disease, unspecified: Secondary | ICD-10-CM

## 2024-07-16 NOTE — Assessment & Plan Note (Addendum)
 His ground glass infiltrates here have improved on most recent imaging.  Etiology of his interstitial disease unclear.  It may have been steroid responsive in the past (COP).  He is on amiodarone  but no clear evidence to support a relationship.  Would need to weigh the pros and cons of continuing it with Dr. Pietro.  The scarring in his right upper lobe is almost certainly due to radiation scar from his treated presumed non-small cell lung cancer.

## 2024-07-16 NOTE — Patient Instructions (Signed)
 We reviewed your PET scan today.  This is improved compared with your prior imaging.  Good news. We will plan to repeat your CT scan of the chest in May 2026. Agree with follow-up with Dr. Pietro as planned Follow Dr. Shelah in May after your CT so we can review those results together

## 2024-07-16 NOTE — Progress Notes (Signed)
 HPI: FU CAD and atrial fibrillation. Patient underwent coronary artery bypass and graft in 2007. He had a LIMA to the LAD, SVG to D1, sequential SVG to OM1 and OM2, sequential SVG to the PDA and posterior lateral. He also had closure of PFO. Abdominal ultrasound May 2014 showed no aneurysm. Had CVA in November of 2014. Transesophageal echocardiogram November 2014 showed normal LV function, mild prolapse of the posterior mitral valve leaflet and an atrial septal aneurysm with negative bubble study. Carotid Dopplers November 2014 showed less than 40% bilateral stenosis. 30 day event monitor showed atrial fibrillation. Nuclear study May 2017 showed ejection fraction 53%.  There was diaphragmatic attenuation but no ischemia.  Had pacemaker placed previously secondary to symptomatic bradycardia.  Also has had recurrent atrial fibrillation on amiodarone .  Patient seen by Dr. Nancey and felt not to be a good candidate for ablation.  Amiodarone  was resumed. Had repeat DCCV 08/31/22. FU echocardiogram October 2024 showed ejection fraction 35 to 40%, mild left ventricular enlargement, mild left ventricular hypertrophy, mild mitral regurgitation.  Since last seen, patient denies dyspnea, chest pain, palpitations or syncope.  Occasional dizziness.  Mild lower extremity edema.  Current Outpatient Medications  Medication Sig Dispense Refill   amiodarone  (PACERONE ) 200 MG tablet Take 1 tablet (200 mg total) by mouth daily. 90 tablet 3   Bacitracin (ANTIBIOTIC EX) Apply 1 Application topically daily as needed (wound care).     cholecalciferol (VITAMIN D) 25 MCG (1000 UNIT) tablet Take 1,000 Units by mouth in the morning.     COD LIVER OIL PO Take 1 capsule by mouth at bedtime.      empagliflozin  (JARDIANCE ) 10 MG TABS tablet TAKE 1 TABLET BY MOUTH EVERY DAY BEFORE BREAKFAST 90 tablet 1   ezetimibe  (ZETIA ) 10 MG tablet Take 10 mg by mouth every morning.     famotidine (PEPCID) 40 MG tablet Take 40 mg by mouth daily.      gabapentin (NEURONTIN) 100 MG capsule Take 100 mg by mouth 3 (three) times daily.     loratadine (CLARITIN) 10 MG tablet Take 10 mg by mouth daily as needed for allergies.     metoprolol  succinate (TOPROL -XL) 25 MG 24 hr tablet TAKE 1 TABLET (25 MG TOTAL) BY MOUTH DAILY. 90 tablet 3   Multiple Vitamin (MULTIVITAMIN WITH MINERALS) TABS tablet Take 1 tablet by mouth in the morning.     Polyethyl Glycol-Propyl Glycol (SYSTANE ULTRA OP) Place 1-2 drops into both eyes in the morning and at bedtime.     pravastatin  (PRAVACHOL ) 80 MG tablet Take 80 mg by mouth at bedtime.     saw palmetto 160 MG capsule Take 160 mg by mouth 2 (two) times daily.     tadalafil (CIALIS) 5 MG tablet Take 5 mg by mouth daily.     TESTOSTERONE TD Place 1 mL onto the skin See admin instructions. 10% testosterone cream compounded at [facility]  - apply 1 ml topically to shoulders once every morning     torsemide  (DEMADEX ) 20 MG tablet TAKE 2 TABLETS BY MOUTH DAILY. 180 tablet 3   White Petrolatum (VASELINE EX) Apply 1 Application topically daily.     XARELTO  15 MG TABS tablet TAKE ONE (1) TABLET BY MOUTH EACH DAY WITH SUPPER 90 tablet 2   No current facility-administered medications for this visit.     Past Medical History:  Diagnosis Date   At risk for sleep apnea    STOP-BANG= 5   SENT TO  PCP 05-20-2014   Bilateral carotid artery stenosis    mild bilateral proximcal ICA  40% per duplex 11/ 2014   Coronary artery disease    a. s/p CABG in 2007 w/ LIMA-LAD, SVG-D1, SVG-1st & 2nd Mrg, SVG-PDA and posterior lateral   ED (erectile dysfunction)    Eye infection 12/27/2014   GERD (gastroesophageal reflux disease)    History of embolic stroke no residual   89/ 2014  --  right PCA and MCA branch infarts (left side vision difficulties)   Hypertension    Other malaise and fatigue    Paroxysmal atrial fibrillation (HCC)    Pneumonia 10/27/2015   led to CHF, in hospital x 6 days   S/P CABG x 6    2007    Ureteral obstruction, right     Past Surgical History:  Procedure Laterality Date   APPENDECTOMY  1950's   CARDIAC CATHETERIZATION  10-31-2005  dr wall   severe three vessel/  perserved LV   CARDIOVASCULAR STRESS TEST  last one 05-19-2014  dr pietro   low risk lexiscan  no exercise study/ small inferobasal infarct with no ischemia /  ef 53%   CARDIOVERSION N/A 10/03/2021   Procedure: CARDIOVERSION;  Surgeon: Santo Stanly LABOR, MD;  Location: [facility] ENDOSCOPY;  Service: Cardiovascular;  Laterality: N/A;   CARDIOVERSION N/A 11/25/2021   Procedure: CARDIOVERSION;  Surgeon: Alvan Ronal BRAVO, MD;  Location: [facility] ENDOSCOPY;  Service: Cardiovascular;  Laterality: N/A;   CARDIOVERSION N/A 12/28/2021   Procedure: CARDIOVERSION;  Surgeon: Sheena Pugh, DO;  Location: [facility] ENDOSCOPY;  Service: Cardiovascular;  Laterality: N/A;   CARDIOVERSION N/A 08/31/2022   Procedure: CARDIOVERSION;  Surgeon: Shlomo Wilbert SAUNDERS, MD;  Location: [facility] ENDOSCOPY;  Service: Cardiovascular;  Laterality: N/A;   CORONARY ARTERY BYPASS GRAFT  11-01-2005  dr dusty   CLOSURE OF PFO/  LIMA to LAD,  SVG to D1,  SVG to 1st & 2nd  MARGINAL branches, SVG  to PDA and posterior lateral   CYSTOSCOPY W/ URETERAL STENT PLACEMENT Right 05/21/2014   Procedure: CYSTOSCOPY WITH RETROGRADE PYELOGRAM/URETERAL STENT PLACEMENT;  Surgeon: Arlena LILLETTE Gal, MD;  Location: [facility];  Service: Urology;  Laterality: Right;   CYSTOSCOPY WITH URETEROSCOPY Right 05/21/2014   Procedure: CYSTOSCOPY WITH URETEROSCOPY;  Surgeon: Arlena LILLETTE Gal, MD;  Location: [facility];  Service: Urology;  Laterality: Right;   INGUINAL HERNIA REPAIR Bilateral    LAPAROSCOPIC CHOLECYSTECTOMY  10-02-2000   PACEMAKER IMPLANT N/A 10/11/2021   Procedure: PACEMAKER IMPLANT;  Surgeon: Waddell Danelle ORN, MD;  Location: [facility] INVASIVE CV LAB;  Service: Cardiovascular;  Laterality: N/A;   RIGHT URETER OBSTRUCTION SURGERY  age 62   TEE WITHOUT CARDIOVERSION  N/A 07/17/2013   Procedure: TRANSESOPHAGEAL ECHOCARDIOGRAM (TEE);  Surgeon: Maude JAYSON Emmer, MD;  Location: [facility] ENDOSCOPY;  Service: Cardiovascular;  Laterality: N/A;  normal LV size, mild prolapse of posterior mitral valve leaflet,  mild MR and TR, normal AV,  no LAA thrombus,  atrial septal aneurysm with no PFO/ ASD and negative bubble study, normal RV, normal aorta with no debris    Social History   Socioeconomic History   Marital status: Married    Spouse name: maxine   Number of children: 4   Years of education: college4   Highest education level: Not on file  Occupational History   Occupation: retired  Tobacco Use   Smoking status: Former    Current packs/day: 0.00    Average packs/day: 1 pack/day for 35.0 years (35.0 ttl  pk-yrs)    Types: Cigarettes    Start date: 08/29/1942    Quit date: 08/29/1977    Years since quitting: 46.9   Smokeless tobacco: Never  Vaping Use   Vaping status: Never Used  Substance and Sexual Activity   Alcohol  use: Not Currently   Drug use: No   Sexual activity: Not on file  Other Topics Concern   Not on file  Social History Narrative   Patient is married with 4 children.   Patient is right handed.   Patient has college education.   Patient drinks decaff coffee.   Social Drivers of Corporate Investment Banker Strain: Not on file  Food Insecurity: No Food Insecurity (10/02/2023)   Hunger Vital Sign    Worried About Running Out of Food in the Last Year: Never true    Ran Out of Food in the Last Year: Never true  Transportation Needs: No Transportation Needs (10/02/2023)   PRAPARE - Administrator, Civil Service (Medical): No    Lack of Transportation (Non-Medical): No  Physical Activity: Not on file  Stress: Not on file  Social Connections: Not on file  Intimate Partner Violence: Not At Risk (10/02/2023)   Humiliation, Afraid, Rape, and Kick questionnaire    Fear of Current or Ex-Partner: No    Emotionally Abused: No    Physically  Abused: No    Sexually Abused: No    Family History  Problem Relation Age of Onset   Heart disease Mother        PPM   Hypertension Mother    Lymphoma Father    Prostate cancer Father    Prostate cancer Brother    Aneurysm Brother        REPAIR   CAD Brother        CABG   Other Brother        STENTS IN LEGS   Other Sister        STOMACH ISSUES   Depression Sister    Lung cancer Brother        Asbestosis    ROS: Right lower extremity weakness but no fevers or chills, productive cough, hemoptysis, dysphasia, odynophagia, melena, hematochezia, dysuria, hematuria, rash, seizure activity, orthopnea, PND, claudication. Remaining systems are negative.  Physical Exam: Well-developed well-nourished in no acute distress.  Skin is warm and dry.  HEENT is normal.  Neck is supple.  Chest is clear to auscultation with normal expansion.  Cardiovascular exam is regular rate and rhythm.  Abdominal exam nontender or distended. No masses palpated. Extremities show 1+ edema. neuro grossly intact  May 07, 2024-AV paced.  Personally reviewed.  A/P  1 paroxysmal atrial fibrillation-patient will continue present dose of amiodarone  and Xarelto .  He is in an atrial paced rhythm today.  Check TSH and liver functions.  Check hemoglobin.  2 coronary artery disease-he denies chest pain.  Continue statin.  3 chronic combined systolic/diastolic congestive heart failure-ARB discontinued previously secondary to dizziness.  Will continue Toprol , Jardiance  and present dose of diuretic.  I have asked him to take an additional Demadex  daily as needed for worsening edema.  4 chronic stage IIIb kidney disease-followed by nephrology.  5 hyperlipidemia-continue pravastatin .  6 hypertension-blood pressure controlled today.  Continue present medications.  7 status post pacemaker-per EP.  Redell Shallow, MD

## 2024-07-16 NOTE — Progress Notes (Signed)
 Subjective:    Patient ID: Jose [name], male    DOB: 25-Aug-1931, 89 y.o.   MRN: [id number]  HPI   ROV 07/16/24 --89 year old man, former smoker with a history of cryptogenic organizing pneumonia.  He underwent empiric SBRT for a 1.5 x 1.7 cm apical right upper lobe nodule most consistent with a non-small cell lung cancer.  Also with CAD/CABG, CVA, hypertension, A-fib, GERD.  We have been following an abnormal CT scan of the chest.  His scan from 03/2024 showed some interval evolving postradiation change in the right upper lobe now 3.3 cm as well as some new diffuse ground glass opacities, question inflammatory in nature.  He was treated with corticosteroids for possible radiation pneumonitis.  He also has a history of amiodarone  use, question whether this could be related, or consider even impact of his volume status.  He underwent PET scan 11/13 as below. He has been doing ok. Has had some dizziness, question whether related to jardiance .   PET scan done 07/10/2024 reviewed by me, shows posttreatment changes in the right lung apex with the treatment area now obscured.  There is some increased FDG uptake max SUV 3.7 without any new nodule to suggest metastatic disease.  A few subcentimeter mediastinal lymph nodes are stable.  There is a short segment of metabolic activity in the proximal esophagus that are likely inflammatory.  There are lower lobe predominant interstitial changes and fibrotic changes that are grossly stable compared with priors.   Review of Systems As per HPI      Objective:   Physical Exam Vitals:   07/16/24 1316  BP: 124/66  Pulse: 97  Temp: (!) 97.4 F (36.3 C)  SpO2: 96%  Weight: 160 lb 6.4 oz (72.8 kg)  Height: 5' 9.5 (1.765 m)    Gen: Pleasant, well-nourished, in no distress,  normal affect  ENT: No lesions,  mouth clear,  oropharynx clear, no postnasal drip  Neck: No JVD, no stridor  Lungs: No use of accessory muscles, no crackles or wheezing on  normal respiration, no wheeze on forced expiration  Cardiovascular: RRR, heart sounds normal, no murmur or gallops, no peripheral edema  Musculoskeletal: No deformities, no cyanosis or clubbing  Neuro: alert, awake, non focal  Skin: Warm, no lesions or rash      Assessment & Plan:  Interstitial lung disease (HCC) His ground glass infiltrates here have improved on most recent imaging.  Etiology of his interstitial disease unclear.  It may have been steroid responsive in the past (COP).  He is on amiodarone  but no clear evidence to support a relationship.  Would need to weigh the pros and cons of continuing it with Dr. Pietro.  The scarring in his right upper lobe is almost certainly due to radiation scar from his treated presumed non-small cell lung cancer.   BOOP (bronchiolitis obliterans with organizing pneumonia) (HCC) Treated with corticosteroids in the past, unclear whether the most recent more diffuse subtle ground glass was the same process versus radiation change, volume status, etc.  The ground glass infiltrates have improved.  Primary non-small cell carcinoma of upper lobe of right lung (HCC) His right upper lobe opacity is less prominent, more scarred down.  Minimal metabolism on PET scan.  I do not see any evidence of recurrent or persistent disease.  I will repeat his CT scan of the chest in 6 months.       Lamar Chris, MD, PhD 07/16/2024, 2:22 PM  Pulmonary and Critical Care  [phone] or if no answer before 7:00PM call [phone] For any issues after 7:00PM please call eLink [phone]

## 2024-07-16 NOTE — Assessment & Plan Note (Signed)
 Treated with corticosteroids in the past, unclear whether the most recent more diffuse subtle ground glass was the same process versus radiation change, volume status, etc.  The ground glass infiltrates have improved.

## 2024-07-16 NOTE — Assessment & Plan Note (Signed)
 His right upper lobe opacity is less prominent, more scarred down.  Minimal metabolism on PET scan.  I do not see any evidence of recurrent or persistent disease.  I will repeat his CT scan of the chest in 6 months.

## 2024-07-21 ENCOUNTER — Ambulatory Visit: Admitting: Neurology

## 2024-07-23 ENCOUNTER — Ambulatory Visit: Admitting: Cardiology

## 2024-07-23 ENCOUNTER — Encounter: Payer: Self-pay | Admitting: Cardiology

## 2024-07-23 VITALS — BP 129/56 | HR 60 | Ht 69.5 in | Wt 165.0 lb

## 2024-07-23 DIAGNOSIS — I4819 Other persistent atrial fibrillation: Secondary | ICD-10-CM

## 2024-07-23 DIAGNOSIS — I2510 Atherosclerotic heart disease of native coronary artery without angina pectoris: Secondary | ICD-10-CM

## 2024-07-23 DIAGNOSIS — I5032 Chronic diastolic (congestive) heart failure: Secondary | ICD-10-CM

## 2024-07-23 DIAGNOSIS — E785 Hyperlipidemia, unspecified: Secondary | ICD-10-CM

## 2024-07-23 DIAGNOSIS — Z950 Presence of cardiac pacemaker: Secondary | ICD-10-CM

## 2024-07-23 DIAGNOSIS — I10 Essential (primary) hypertension: Secondary | ICD-10-CM

## 2024-07-23 NOTE — Patient Instructions (Signed)
  Follow-Up: At [facility], you and your health needs are our priority.  As part of our continuing mission to provide you with exceptional heart care, our providers are all part of one team.  This team includes your primary Cardiologist (physician) and Advanced Practice Providers or APPs (Physician Assistants and Nurse Practitioners) who all work together to provide you with the care you need, when you need it.  Your next appointment:   3 month(s)  Provider:   Alexandria Angel, MD

## 2024-07-24 ENCOUNTER — Ambulatory Visit: Payer: Self-pay | Admitting: Cardiology

## 2024-07-24 LAB — CBC
Hematocrit: 41.5 % (ref 37.5–51.0)
Hemoglobin: 13.6 g/dL (ref 13.0–17.7)
MCH: 30.8 pg (ref 26.6–33.0)
MCHC: 32.8 g/dL (ref 31.5–35.7)
MCV: 94 fL (ref 79–97)
Platelets: 114 x10E3/uL — ABNORMAL LOW (ref 150–450)
RBC: 4.41 x10E6/uL (ref 4.14–5.80)
RDW: 15.5 % — ABNORMAL HIGH (ref 11.6–15.4)
WBC: 5.2 x10E3/uL (ref 3.4–10.8)

## 2024-07-24 LAB — HEPATIC FUNCTION PANEL
ALT: 24 IU/L (ref 0–44)
AST: 32 IU/L (ref 0–40)
Albumin: 4 g/dL (ref 3.6–4.6)
Alkaline Phosphatase: 110 IU/L (ref 48–129)
Bilirubin Total: 0.6 mg/dL (ref 0.0–1.2)
Bilirubin, Direct: 0.23 mg/dL (ref 0.00–0.40)
Total Protein: 7.1 g/dL (ref 6.0–8.5)

## 2024-07-24 LAB — TSH: TSH: 1.29 u[IU]/mL (ref 0.450–4.500)

## 2024-07-30 NOTE — Telephone Encounter (Signed)
 Letter of results sent to pt

## 2024-08-11 ENCOUNTER — Other Ambulatory Visit: Payer: Self-pay | Admitting: Cardiology

## 2024-08-26 ENCOUNTER — Other Ambulatory Visit: Payer: Self-pay | Admitting: Family Medicine

## 2024-08-26 DIAGNOSIS — R29898 Other symptoms and signs involving the musculoskeletal system: Secondary | ICD-10-CM

## 2024-08-26 DIAGNOSIS — G8310 Monoplegia of lower limb affecting unspecified side: Secondary | ICD-10-CM

## 2024-08-27 ENCOUNTER — Encounter (HOSPITAL_COMMUNITY): Payer: Self-pay | Admitting: Radiology

## 2024-09-25 NOTE — CV Procedure (Signed)
" °  Device system confirmed to be MRI conditional, with implant date > 6 weeks ago, and no evidence of abandoned or epicardial leads in review of most recent CXR  Device last cleared by EP Provider: Cleared 04/2024  Clearance is good through for 1 year as long as parameters remain stable at time of check. If pt undergoes a cardiac device procedure during that time, they should be re-cleared.   Tachy-therapies to be programmed off if applicable with device back to pre-MRI settings after completion of exam.  [employer] - Programming recommendation received through [employer] App/Tablet  Rocky Catalan, RT  09/25/2024 9:58 AM     "

## 2024-09-30 ENCOUNTER — Ambulatory Visit (HOSPITAL_COMMUNITY)
Admission: RE | Admit: 2024-09-30 | Discharge: 2024-09-30 | Disposition: A | Source: Ambulatory Visit | Attending: Family Medicine | Admitting: Family Medicine

## 2024-09-30 DIAGNOSIS — G8310 Monoplegia of lower limb affecting unspecified side: Secondary | ICD-10-CM

## 2024-09-30 DIAGNOSIS — R29898 Other symptoms and signs involving the musculoskeletal system: Secondary | ICD-10-CM

## 2024-10-01 NOTE — Progress Notes (Unsigned)
 "    HPI: FU CAD and atrial fibrillation. Patient underwent coronary artery bypass and graft in 2007. He had a LIMA to the LAD, SVG to D1, sequential SVG to OM1 and OM2, sequential SVG to the PDA and posterior lateral. He also had closure of PFO. Abdominal ultrasound May 2014 showed no aneurysm. Had CVA in November of 2014. Transesophageal echocardiogram November 2014 showed normal LV function, mild prolapse of the posterior mitral valve leaflet and an atrial septal aneurysm with negative bubble study. Carotid Dopplers November 2014 showed less than 40% bilateral stenosis. 30 day event monitor showed atrial fibrillation. Nuclear study May 2017 showed ejection fraction 53%.  There was diaphragmatic attenuation but no ischemia.  Had pacemaker placed previously secondary to symptomatic bradycardia.  Also has had recurrent atrial fibrillation on amiodarone .  Patient seen by Dr. Nancey and felt not to be a good candidate for ablation.  Amiodarone  was resumed. Had repeat DCCV 08/31/22. FU echocardiogram October 2024 showed ejection fraction 35 to 40%, mild left ventricular enlargement, mild left ventricular hypertrophy, mild mitral regurgitation.  Since last seen,   Current Outpatient Medications  Medication Sig Dispense Refill   amiodarone  (PACERONE ) 200 MG tablet Take 1 tablet (200 mg total) by mouth daily. 90 tablet 3   Bacitracin (ANTIBIOTIC EX) Apply 1 Application topically daily as needed (wound care).     cholecalciferol (VITAMIN D) 25 MCG (1000 UNIT) tablet Take 1,000 Units by mouth in the morning.     COD LIVER OIL PO Take 1 capsule by mouth at bedtime.      empagliflozin  (JARDIANCE ) 10 MG TABS tablet TAKE 1 TABLET BY MOUTH EVERY DAY BEFORE BREAKFAST 90 tablet 3   ezetimibe  (ZETIA ) 10 MG tablet Take 10 mg by mouth every morning.     famotidine (PEPCID) 40 MG tablet Take 40 mg by mouth daily.     gabapentin (NEURONTIN) 100 MG capsule Take 100 mg by mouth 3 (three) times daily.     loratadine  (CLARITIN) 10 MG tablet Take 10 mg by mouth daily as needed for allergies.     metoprolol  succinate (TOPROL -XL) 25 MG 24 hr tablet TAKE 1 TABLET (25 MG TOTAL) BY MOUTH DAILY. 90 tablet 3   Multiple Vitamin (MULTIVITAMIN WITH MINERALS) TABS tablet Take 1 tablet by mouth in the morning.     Polyethyl Glycol-Propyl Glycol (SYSTANE ULTRA OP) Place 1-2 drops into both eyes in the morning and at bedtime.     pravastatin  (PRAVACHOL ) 80 MG tablet Take 80 mg by mouth at bedtime.     saw palmetto 160 MG capsule Take 160 mg by mouth 2 (two) times daily.     tadalafil (CIALIS) 5 MG tablet Take 5 mg by mouth daily.     TESTOSTERONE TD Place 1 mL onto the skin See admin instructions. 10% testosterone cream compounded at [facility]  - apply 1 ml topically to shoulders once every morning     torsemide  (DEMADEX ) 20 MG tablet TAKE 2 TABLETS BY MOUTH DAILY. 180 tablet 3   White Petrolatum (VASELINE EX) Apply 1 Application topically daily.     XARELTO  15 MG TABS tablet TAKE ONE (1) TABLET BY MOUTH EACH DAY WITH SUPPER 90 tablet 2   No current facility-administered medications for this visit.     Past Medical History:  Diagnosis Date   At risk for sleep apnea    STOP-BANG= 5   SENT TO PCP 05-20-2014   Bilateral carotid artery stenosis    mild bilateral proximcal  ICA  40% per duplex 11/ 2014   Coronary artery disease    a. s/p CABG in 2007 w/ LIMA-LAD, SVG-D1, SVG-1st & 2nd Mrg, SVG-PDA and posterior lateral   ED (erectile dysfunction)    Eye infection 12/27/2014   GERD (gastroesophageal reflux disease)    History of embolic stroke no residual   88/ 2014  --  right PCA and MCA branch infarts (left side vision difficulties)   Hypertension    Other malaise and fatigue    Paroxysmal atrial fibrillation (HCC)    Pneumonia 10/27/2015   led to CHF, in hospital x 6 days   S/P CABG x 6    2007   Ureteral obstruction, right     Past Surgical History:  Procedure Laterality Date   APPENDECTOMY   04/28/1949   CARDIAC CATHETERIZATION  10-31-2005  dr wall   severe three vessel/  perserved LV   CARDIOVASCULAR STRESS TEST  last one 05-19-2014  dr pietro   low risk lexiscan  no exercise study/ small inferobasal infarct with no ischemia /  ef 53%   CARDIOVERSION N/A 10/03/2021   Procedure: CARDIOVERSION;  Surgeon: Santo Stanly LABOR, MD;  Location: [facility] ENDOSCOPY;  Service: Cardiovascular;  Laterality: N/A;   CARDIOVERSION N/A 11/25/2021   Procedure: CARDIOVERSION;  Surgeon: Alvan Ronal BRAVO, MD;  Location: [facility] ENDOSCOPY;  Service: Cardiovascular;  Laterality: N/A;   CARDIOVERSION N/A 12/28/2021   Procedure: CARDIOVERSION;  Surgeon: Sheena Pugh, DO;  Location: [facility] ENDOSCOPY;  Service: Cardiovascular;  Laterality: N/A;   CARDIOVERSION N/A 08/31/2022   Procedure: CARDIOVERSION;  Surgeon: Shlomo Wilbert SAUNDERS, MD;  Location: [facility] ENDOSCOPY;  Service: Cardiovascular;  Laterality: N/A;   CORONARY ARTERY BYPASS GRAFT  11-01-2005  dr dusty   CLOSURE OF PFO/  LIMA to LAD,  SVG to D1,  SVG to 1st & 2nd  MARGINAL branches, SVG  to PDA and posterior lateral   CYSTOSCOPY W/ URETERAL STENT PLACEMENT Right 05/21/2014   Procedure: CYSTOSCOPY WITH RETROGRADE PYELOGRAM/URETERAL STENT PLACEMENT;  Surgeon: Arlena LILLETTE Gal, MD;  Location: [facility] ;  Service: Urology;  Laterality: Right;   CYSTOSCOPY WITH URETEROSCOPY Right 05/21/2014   Procedure: CYSTOSCOPY WITH URETEROSCOPY;  Surgeon: Arlena LILLETTE Gal, MD;  Location: [facility];  Service: Urology;  Laterality: Right;   INGUINAL HERNIA REPAIR Bilateral    LAPAROSCOPIC CHOLECYSTECTOMY  10/02/2000   PACEMAKER IMPLANT N/A 10/11/2021   Procedure: PACEMAKER IMPLANT;  Surgeon: Waddell Danelle ORN, MD;  Location: [facility] INVASIVE CV LAB;  Service: Cardiovascular;  Laterality: N/A;   RIGHT URETER OBSTRUCTION SURGERY  age 89   TEE WITHOUT CARDIOVERSION N/A 07/17/2013   Procedure: TRANSESOPHAGEAL ECHOCARDIOGRAM (TEE);  Surgeon: Maude JAYSON Emmer, MD;  Location: [facility] ENDOSCOPY;  Service: Cardiovascular;  Laterality: N/A;  normal LV size, mild prolapse of posterior mitral valve leaflet,  mild MR and TR, normal AV,  no LAA thrombus,  atrial septal aneurysm with no PFO/ ASD and negative bubble study, normal RV, normal aorta with no debris    Social History   Socioeconomic History   Marital status: Married    Spouse name: maxine   Number of children: 4   Years of education: college4   Highest education level: Not on file  Occupational History   Occupation: retired  Tobacco Use   Smoking status: Former    Current packs/day: 0.00    Average packs/day: 1 pack/day for 35.0 years (35.0 ttl pk-yrs)    Types: Cigarettes    Start date: 08/29/1942  Quit date: 08/29/1977    Years since quitting: 47.1   Smokeless tobacco: Never  Vaping Use   Vaping status: Never Used  Substance and Sexual Activity   Alcohol  use: Not Currently   Drug use: No   Sexual activity: Not on file  Other Topics Concern   Not on file  Social History Narrative   Patient is married with 4 children.   Patient is right handed.   Patient has college education.   Patient drinks decaff coffee.   Social Drivers of Health   Tobacco Use: Medium Risk (07/23/2024)   Patient History    Smoking Tobacco Use: Former    Smokeless Tobacco Use: Never    Passive Exposure: Not on file  Financial Resource Strain: Not on file  Food Insecurity: Low Risk (09/02/2024)   Received from [facility]   Epic    Within the past 12 months, you worried that your food would run out before you got money to buy more: Never true    Within the past 12 months, the food you bought just didn't last and you didn't have money to get more. : Never true  Transportation Needs: No Transportation Needs (09/02/2024)   Received from [facility]    In the past 12 months, has lack of reliable transportation kept you from medical appointments, meetings, work or from getting things  needed for daily living? : No  Physical Activity: Not on file  Stress: Not on file  Social Connections: Not on file  Intimate Partner Violence: Not At Risk (10/02/2023)   Humiliation, Afraid, Rape, and Kick questionnaire    Fear of Current or Ex-Partner: No    Emotionally Abused: No    Physically Abused: No    Sexually Abused: No  Depression (PHQ2-9): Low Risk (10/02/2023)   Depression (PHQ2-9)    PHQ-2 Score: 0  Alcohol  Screen: Low Risk (10/02/2023)   Alcohol  Screen    Last Alcohol  Screening Score (AUDIT): 0  Housing: Low Risk (09/02/2024)   Received from [facility]   Epic    What is your living situation today?: I have a steady place to live    Think about the place you live. Do you have problems with any of the following? Choose all that apply:: None/None on this list  Utilities: Low Risk (09/02/2024)   Received from [facility]   Utilities    In the past 12 months has the electric, gas, oil, or water  company threatened to shut off services in your home? : No  Health Literacy: Not on file    Family History  Problem Relation Age of Onset   Heart disease Mother        PPM   Hypertension Mother    Lymphoma Father    Prostate cancer Father    Prostate cancer Brother    Aneurysm Brother        REPAIR   CAD Brother        CABG   Other Brother        STENTS IN LEGS   Other Sister        STOMACH ISSUES   Depression Sister    Lung cancer Brother        Asbestosis    ROS: no fevers or chills, productive cough, hemoptysis, dysphasia, odynophagia, melena, hematochezia, dysuria, hematuria, rash, seizure activity, orthopnea, PND, pedal edema, claudication. Remaining systems are negative.  Physical Exam: Well-developed well-nourished in no acute distress.  Skin is warm and  dry.  HEENT is normal.  Neck is supple.  Chest is clear to auscultation with normal expansion.  Cardiovascular exam is regular rate and rhythm.  Abdominal exam nontender or distended. No masses  palpated. Extremities show no edema. neuro grossly intact  ECG- personally reviewed  A/P  1 paroxysmal atrial fibrillation-continue amiodarone  and Xarelto .  2 coronary artery disease-patient denies chest pain.  Continue statin.  3 chronic combined systolic/diastolic congestive heart failure-continue Toprol , Jardiance  and present dose of diuretic.  ARB discontinued previously secondary to dizziness and renal insufficiency.  4 chronic stage IIIb kidney disease-patient is followed by nephrology.  5 history of pacemaker-managed by electrophysiology.  6 hypertension-blood pressure controlled.  Continue present medical regimen.  7 hyperlipidemia-continue statin.  Redell Shallow, MD    "

## 2024-10-03 ENCOUNTER — Other Ambulatory Visit: Payer: Self-pay

## 2024-10-03 ENCOUNTER — Telehealth: Payer: Self-pay | Admitting: Cardiology

## 2024-10-03 ENCOUNTER — Encounter (HOSPITAL_COMMUNITY): Payer: Self-pay | Admitting: Family Medicine

## 2024-10-03 ENCOUNTER — Emergency Department (HOSPITAL_COMMUNITY)

## 2024-10-03 ENCOUNTER — Inpatient Hospital Stay (HOSPITAL_COMMUNITY): Admission: EM | Admit: 2024-10-03 | Source: Home / Self Care | Admitting: Family Medicine

## 2024-10-03 DIAGNOSIS — I509 Heart failure, unspecified: Principal | ICD-10-CM

## 2024-10-03 DIAGNOSIS — J849 Interstitial pulmonary disease, unspecified: Secondary | ICD-10-CM | POA: Diagnosis present

## 2024-10-03 DIAGNOSIS — D696 Thrombocytopenia, unspecified: Secondary | ICD-10-CM | POA: Diagnosis present

## 2024-10-03 DIAGNOSIS — I2510 Atherosclerotic heart disease of native coronary artery without angina pectoris: Secondary | ICD-10-CM | POA: Diagnosis present

## 2024-10-03 DIAGNOSIS — I4891 Unspecified atrial fibrillation: Secondary | ICD-10-CM | POA: Diagnosis present

## 2024-10-03 DIAGNOSIS — I5023 Acute on chronic systolic (congestive) heart failure: Secondary | ICD-10-CM | POA: Diagnosis present

## 2024-10-03 DIAGNOSIS — Z8673 Personal history of transient ischemic attack (TIA), and cerebral infarction without residual deficits: Secondary | ICD-10-CM

## 2024-10-03 DIAGNOSIS — N184 Chronic kidney disease, stage 4 (severe): Secondary | ICD-10-CM | POA: Diagnosis present

## 2024-10-03 DIAGNOSIS — J9610 Chronic respiratory failure, unspecified whether with hypoxia or hypercapnia: Secondary | ICD-10-CM | POA: Diagnosis present

## 2024-10-03 LAB — CBC WITH DIFFERENTIAL/PLATELET
Abs Immature Granulocytes: 0.04 10*3/uL (ref 0.00–0.07)
Basophils Absolute: 0 10*3/uL (ref 0.0–0.1)
Basophils Relative: 0 %
Eosinophils Absolute: 0.1 10*3/uL (ref 0.0–0.5)
Eosinophils Relative: 1 %
HCT: 34.2 % — ABNORMAL LOW (ref 39.0–52.0)
Hemoglobin: 11.2 g/dL — ABNORMAL LOW (ref 13.0–17.0)
Immature Granulocytes: 1 %
Lymphocytes Relative: 9 %
Lymphs Abs: 0.7 10*3/uL (ref 0.7–4.0)
MCH: 30.6 pg (ref 26.0–34.0)
MCHC: 32.7 g/dL (ref 30.0–36.0)
MCV: 93.4 fL (ref 80.0–100.0)
Monocytes Absolute: 0.7 10*3/uL (ref 0.1–1.0)
Monocytes Relative: 10 %
Neutro Abs: 5.9 10*3/uL (ref 1.7–7.7)
Neutrophils Relative %: 79 %
Platelets: 117 10*3/uL — ABNORMAL LOW (ref 150–400)
RBC: 3.66 MIL/uL — ABNORMAL LOW (ref 4.22–5.81)
RDW: 16.5 % — ABNORMAL HIGH (ref 11.5–15.5)
WBC: 7.4 10*3/uL (ref 4.0–10.5)
nRBC: 0 % (ref 0.0–0.2)

## 2024-10-03 LAB — COMPREHENSIVE METABOLIC PANEL WITH GFR
ALT: 21 U/L (ref 0–44)
AST: 26 U/L (ref 15–41)
Albumin: 3.6 g/dL (ref 3.5–5.0)
Alkaline Phosphatase: 115 U/L (ref 38–126)
Anion gap: 12 (ref 5–15)
BUN: 35 mg/dL — ABNORMAL HIGH (ref 8–23)
CO2: 23 mmol/L (ref 22–32)
Calcium: 9.1 mg/dL (ref 8.9–10.3)
Chloride: 102 mmol/L (ref 98–111)
Creatinine, Ser: 2.86 mg/dL — ABNORMAL HIGH (ref 0.61–1.24)
GFR, Estimated: 20 mL/min — ABNORMAL LOW
Glucose, Bld: 123 mg/dL — ABNORMAL HIGH (ref 70–99)
Potassium: 3.8 mmol/L (ref 3.5–5.1)
Sodium: 137 mmol/L (ref 135–145)
Total Bilirubin: 1 mg/dL (ref 0.0–1.2)
Total Protein: 7.4 g/dL (ref 6.5–8.1)

## 2024-10-03 LAB — TROPONIN T, HIGH SENSITIVITY
Troponin T High Sensitivity: 85 ng/L — ABNORMAL HIGH (ref 0–19)
Troponin T High Sensitivity: 77 ng/L — ABNORMAL HIGH (ref 0–19)

## 2024-10-03 LAB — RESP PANEL BY RT-PCR (RSV, FLU A&B, COVID)  RVPGX2
Influenza A by PCR: NEGATIVE
Influenza B by PCR: NEGATIVE
Resp Syncytial Virus by PCR: NEGATIVE
SARS Coronavirus 2 by RT PCR: NEGATIVE

## 2024-10-03 LAB — PRO BRAIN NATRIURETIC PEPTIDE: Pro Brain Natriuretic Peptide: 4125 pg/mL — ABNORMAL HIGH

## 2024-10-03 MED ORDER — FUROSEMIDE 10 MG/ML IJ SOLN
60.0000 mg | Freq: Once | INTRAMUSCULAR | Status: AC
  Administered 2024-10-03: 60 mg via INTRAVENOUS
  Filled 2024-10-03: qty 6

## 2024-10-03 NOTE — ED Triage Notes (Addendum)
 Patient arrives via [facility] from home for palpitations and shortness of breath x3 days. Extensive cardiac hx. Blood in urine x3 days. Endorses more weakness than usual.   BP 143/76, HR 64

## 2024-10-03 NOTE — Telephone Encounter (Signed)
 Physical therapy called from patient's home. Pt stating he feels fluttering, short of breath and tired. Pt also states he feels like he is back in afib.  HR 60, BP 121/64 Advised to do minimal therapy, since patient feels short of breath. Pt denies CP. Also advised patient to go to the Emergency Room if his shortness of breath worsens of if his HR maintains >100. Pt stated he did not want to go to the ER at this time.  Pt has hx of afib. Appt with Dr Pietro 2/16  Will send to Dr Pietro for further recommendations.

## 2024-10-03 NOTE — ED Provider Notes (Cosign Needed)
 " [place] EMERGENCY DEPARTMENT AT [facility] Provider Note   CSN: [id number] Arrival date & time: 10/03/24  2021     Patient presents with: Palpitations   Jose [name] is a 89 y.o. male.  Patient presented to the emergency department today with concerns of palpitations.  He has a history significant for hypertension, atrial fibrillation, CAD.  Reports that he has again feel palpitations and short of breath for about 3 days.  He also has noted some blood in his urine.  He feels somewhat weaker than typical but has not had any falls.  No fever, chills or bodyaches.  Denies any sick contacts.  Regarding urination, he denies any discomfort with urination but noted some blood present.  He is on Xarelto .   Palpitations      Prior to Admission medications  Medication Sig Start Date End Date Taking? Authorizing Provider  amiodarone  (PACERONE ) 200 MG tablet Take 1 tablet (200 mg total) by mouth daily. 10/04/23   Pietro Redell RAMAN, MD  Bacitracin (ANTIBIOTIC EX) Apply 1 Application topically daily as needed (wound care).    [provider]  cholecalciferol (VITAMIN D) 25 MCG (1000 UNIT) tablet Take 1,000 Units by mouth in the morning.    [provider]  COD LIVER OIL PO Take 1 capsule by mouth at bedtime.     [provider]  empagliflozin  (JARDIANCE ) 10 MG TABS tablet TAKE 1 TABLET BY MOUTH EVERY DAY BEFORE BREAKFAST 08/13/24   Pietro Redell RAMAN, MD  ezetimibe  (ZETIA ) 10 MG tablet Take 10 mg by mouth every morning. 08/25/20   [provider]  famotidine (PEPCID) 40 MG tablet Take 40 mg by mouth daily. 04/27/23   [provider]  gabapentin (NEURONTIN) 100 MG capsule Take 100 mg by mouth 3 (three) times daily. 07/17/24   [provider]  loratadine (CLARITIN) 10 MG tablet Take 10 mg by mouth daily as needed for allergies.    [provider]  metoprolol  succinate (TOPROL -XL) 25 MG 24 hr tablet TAKE 1 TABLET (25 MG TOTAL)  BY MOUTH DAILY. 06/13/24   Sheron Lorette GRADE, PA-C  Multiple Vitamin (MULTIVITAMIN WITH MINERALS) TABS tablet Take 1 tablet by mouth in the morning.    [provider]  Polyethyl Glycol-Propyl Glycol (SYSTANE ULTRA OP) Place 1-2 drops into both eyes in the morning and at bedtime.    [provider]  pravastatin  (PRAVACHOL ) 80 MG tablet Take 80 mg by mouth at bedtime.    [provider]  saw palmetto 160 MG capsule Take 160 mg by mouth 2 (two) times daily.    [provider]  tadalafil (CIALIS) 5 MG tablet Take 5 mg by mouth daily. 08/09/23   [provider]  TESTOSTERONE TD Place 1 mL onto the skin See admin instructions. 10% testosterone cream compounded at [facility]  - apply 1 ml topically to shoulders once every morning    [provider]  torsemide  (DEMADEX ) 20 MG tablet TAKE 2 TABLETS BY MOUTH DAILY. 07/03/24   Pietro Redell RAMAN, MD  White Petrolatum (VASELINE EX) Apply 1 Application topically daily.    [provider]  XARELTO  15 MG TABS tablet TAKE ONE (1) TABLET BY MOUTH EACH DAY WITH SUPPER 01/12/20   Pietro Redell RAMAN, MD    Allergies: Cephalexin, Iodinated contrast media, Iohexol , Ace inhibitors, Metoprolol , Viagra [sildenafil citrate], Ciprofloxacin , Codeine, and Sulfa antibiotics    Review of Systems  Cardiovascular:  Positive for palpitations.  All other  systems reviewed and are negative.   Updated Vital Signs BP 132/73   Pulse (!) 59   Temp (!) 97.4 F (36.3 C) (Oral)   Resp 18   SpO2 97%   Physical Exam Vitals and nursing note reviewed.  Constitutional:      General: He is not in acute distress.    Appearance: He is well-developed. He is ill-appearing.  HENT:     Head: Normocephalic and atraumatic.  Eyes:     Conjunctiva/sclera: Conjunctivae normal.  Cardiovascular:     Rate and Rhythm: Normal rate and regular rhythm.     Heart sounds: No murmur heard.    No gallop.  Pulmonary:     Effort:  Pulmonary effort is normal. No respiratory distress.     Breath sounds: Normal breath sounds.     Comments: Increased work of breathing but no distress. Abdominal:     General: There is no distension.     Palpations: Abdomen is soft.     Tenderness: There is no abdominal tenderness. There is no guarding.  Musculoskeletal:        General: No swelling.     Cervical back: Neck supple.     Right lower leg: 2+ Pitting Edema present.     Left lower leg: 2+ Pitting Edema present.  Skin:    General: Skin is warm and dry.     Capillary Refill: Capillary refill takes less than 2 seconds.  Neurological:     Mental Status: He is alert.  Psychiatric:        Mood and Affect: Mood normal.     (all labs ordered are listed, but only abnormal results are displayed) Labs Reviewed  CBC WITH DIFFERENTIAL/PLATELET - Abnormal; Notable for the following components:      Result Value   RBC 3.66 (*)    Hemoglobin 11.2 (*)    HCT 34.2 (*)    RDW 16.5 (*)    Platelets 117 (*)    All other components within normal limits  COMPREHENSIVE METABOLIC PANEL WITH GFR - Abnormal; Notable for the following components:   Glucose, Bld 123 (*)    BUN 35 (*)    Creatinine, Ser 2.86 (*)    GFR, Estimated 20 (*)    All other components within normal limits  PRO BRAIN NATRIURETIC PEPTIDE - Abnormal; Notable for the following components:   Pro Brain Natriuretic Peptide 4,125.0 (*)    All other components within normal limits  TROPONIN T, HIGH SENSITIVITY - Abnormal; Notable for the following components:   Troponin T High Sensitivity 85 (*)    All other components within normal limits  TROPONIN T, HIGH SENSITIVITY - Abnormal; Notable for the following components:   Troponin T High Sensitivity 77 (*)    All other components within normal limits  RESP PANEL BY RT-PCR (RSV, FLU A&B, COVID)  [id number]  URINALYSIS, ROUTINE W REFLEX MICROSCOPIC    EKG: EKG Interpretation Date/Time:  Friday October 03 2024 20:37:29  EST Ventricular Rate:  60 PR Interval:    QRS Duration:  165 QT Interval:  513 QTC Calculation: 513 R Axis:   -27  Text Interpretation: Junctional rhythm Nonspecific intraventricular conduction delay Inferior infarct, old Anterior infarct, old Confirmed by Cottie Cough [phone]) on 10/03/2024 8:55:50 PM  Radiology: DG Chest Portable 1 View Result Date: 10/03/2024 EXAM: 1 VIEW XRAY OF THE CHEST 10/03/2024 08:49:00 PM COMPARISON: None available. CLINICAL HISTORY: Cough, weakness. FINDINGS: LINES, TUBES AND DEVICES: Left subclavian dual lead pacemaker in  place. CABG markers noted. Sternotomy wires noted. LUNGS AND PLEURA: Suprahilar and bilateral perihilar interstitial pulmonary infiltrates most suggestive of asymmetric perihilar pulmonary edema, new from prior PET CT examination of 07/10/2024. New small right pleural effusion. No pneumothorax. HEART AND MEDIASTINUM: Cardiomegaly. BONES AND SOFT TISSUES: No acute osseous abnormality. IMPRESSION: 1. Suprahilar and bilateral perihilar interstitial pulmonary infiltrates most suggestive of asymmetric perihilar pulmonary edema. 2. Small right pleural effusion. 3. Cardiomegaly. 4. Left subclavian dual lead pacemaker and prior CABG with sternotomy wires. Electronically signed by: Dorethia Molt MD 10/03/2024 09:42 PM EST RP Workstation: [id number]     Procedures   Medications Ordered in the ED  furosemide  (LASIX ) injection 60 mg (60 mg Intravenous Given 10/03/24 2243)                                    Medical Decision Making Amount and/or Complexity of Data Reviewed Labs: ordered. Radiology: ordered.  Risk Prescription drug management.   This patient presents to the ED for concern of palpitations, this involves an extensive number of treatment options, and is a complaint that carries with it a high risk of complications and morbidity.  The differential diagnosis includes atrial fibrillation, CAD, pneumonia, CHF   Co morbidities that complicate  the patient evaluation  CHF, hypertension, CKD stage IV, atrial fibrillation   Additional history obtained:  Additional history obtained from chart review   Lab Tests:  I Ordered, and personally interpreted labs.  The pertinent results include: CBC with decreasing hemoglobin down to 11.2 compared to 13 2 months ago, CMP with baseline renal dysfunction, troponin elevated 85, proBNP elevated at 4000 125, respiratory panel negative, UA pending   Imaging Studies ordered:  I ordered imaging studies including chest x-ray I independently visualized and interpreted imaging which showed Suprahilar and bilateral perihilar interstitial pulmonary infiltrates most suggestive of asymmetric perihilar pulmonary edema. 2. Small right pleural effusion. 3. Cardiomegaly. 4. Left subclavian dual lead pacemaker and prior CABG with sternotomy wires. I agree with the radiologist interpretation   Cardiac Monitoring: / EKG:  The patient was maintained on a cardiac monitor.  I personally viewed and interpreted the cardiac monitored which showed an underlying rhythm of: Junctional rhythm   Consultations Obtained:  I requested consultation with the hospitalist,  and discussed lab and imaging findings as well as pertinent plan - they recommend: Spoke with Dr. Charlton, hospitalist, who will be admitting patient.   Problem List / ED Course / Critical interventions / Medication management  Patient presents to the emergency department today with concerns of palpitations.  Reportedly has been feeling increasing shortness of breath over the last 3 days or so.  Notices primarily shortness of breath with ambulation and when laying down trying to sleep.  He is on torsemide  40 mg  and has been taking this medication.  Denies any significant change in fluid intake.  No fever, chills or bodyaches.  He does note that he has had some blood in his urine over the last 2 days as well.  He is on Xarelto .  Denies any pain with  urination.  On exam, bibasilar crackles.  2+ pitting edema bilaterally.  Abdomen soft and nontender. Workup concerning for CHF exacerbation with pro BNP elevation at 4125, troponin elevated at 85, and chest xray showing pulmonary edema and a small right pleural effusion. Lasix  ordered for diuresis. UA added for assessment of hematuria and is pending. Will require admission.  Spoke with Dr. Charlton, hospitalist, who will be admitting patient. Reevaluation of the patient after these medicines showed that the patient improved I have reviewed the patients home medicines and have made adjustments as needed   Test / Admission - Considered:  Admission required  Final diagnoses:  Acute on chronic congestive heart failure, unspecified heart failure type [facility])    ED Discharge Orders     None          Cecily Legrand LABOR, PA-C 10/03/24 2321  "

## 2024-10-03 NOTE — Telephone Encounter (Signed)
 Patient c/o Palpitations:  STAT if patient reporting lightheadedness, shortness of breath, or chest pain  How long have you had palpitations/irregular HR/ Afib? Are you having the symptoms now? Afib started 10/02/24, Yes heart flutters   Are you currently experiencing lightheadedness, SOB or CP? Chest discomfort, Lightheadedness, and SOB  Do you have a history of afib (atrial fibrillation) or irregular heart rhythm? Yes   Have you checked your BP or HR? (document readings if available): Hr 60 BP 121/64  Are you experiencing any other symptoms? Chest discomfort, Lightheadedness, and SOB

## 2024-10-07 ENCOUNTER — Ambulatory Visit: Admitting: Orthopedic Surgery

## 2024-10-09 ENCOUNTER — Ambulatory Visit

## 2024-10-13 ENCOUNTER — Ambulatory Visit: Admitting: Cardiology

## 2025-01-08 ENCOUNTER — Ambulatory Visit

## 2025-01-14 ENCOUNTER — Ambulatory Visit: Admitting: Emergency Medicine

## 2025-04-09 ENCOUNTER — Ambulatory Visit

## 2025-07-09 ENCOUNTER — Ambulatory Visit
